# Patient Record
Sex: Male | Born: 1935 | Race: Black or African American | Hispanic: No | Marital: Married | Smoking: Former smoker
Health system: Southern US, Community
[De-identification: ages and names within clinical notes are randomized; demographics above are authoritative.]

## PROBLEM LIST (undated history)

## (undated) DIAGNOSIS — G8929 Other chronic pain: Secondary | ICD-10-CM

## (undated) DIAGNOSIS — H409 Unspecified glaucoma: Secondary | ICD-10-CM

## (undated) DIAGNOSIS — I2510 Atherosclerotic heart disease of native coronary artery without angina pectoris: Secondary | ICD-10-CM

## (undated) DIAGNOSIS — K279 Peptic ulcer, site unspecified, unspecified as acute or chronic, without hemorrhage or perforation: Secondary | ICD-10-CM

## (undated) DIAGNOSIS — N183 Chronic kidney disease, stage 3 unspecified: Secondary | ICD-10-CM

## (undated) DIAGNOSIS — R05 Cough: Secondary | ICD-10-CM

## (undated) DIAGNOSIS — R413 Other amnesia: Secondary | ICD-10-CM

## (undated) DIAGNOSIS — F0280 Dementia in other diseases classified elsewhere without behavioral disturbance: Secondary | ICD-10-CM

## (undated) DIAGNOSIS — D649 Anemia, unspecified: Secondary | ICD-10-CM

## (undated) DIAGNOSIS — I6529 Occlusion and stenosis of unspecified carotid artery: Secondary | ICD-10-CM

## (undated) DIAGNOSIS — G309 Alzheimer's disease, unspecified: Secondary | ICD-10-CM

## (undated) DIAGNOSIS — J449 Chronic obstructive pulmonary disease, unspecified: Secondary | ICD-10-CM

## (undated) DIAGNOSIS — I10 Essential (primary) hypertension: Secondary | ICD-10-CM

## (undated) DIAGNOSIS — F418 Other specified anxiety disorders: Secondary | ICD-10-CM

## (undated) DIAGNOSIS — E785 Hyperlipidemia, unspecified: Secondary | ICD-10-CM

## (undated) DIAGNOSIS — R059 Cough, unspecified: Secondary | ICD-10-CM

## (undated) DIAGNOSIS — R0789 Other chest pain: Secondary | ICD-10-CM

## (undated) DIAGNOSIS — Z9981 Dependence on supplemental oxygen: Secondary | ICD-10-CM

## (undated) DIAGNOSIS — D472 Monoclonal gammopathy: Secondary | ICD-10-CM

## (undated) DIAGNOSIS — N400 Enlarged prostate without lower urinary tract symptoms: Secondary | ICD-10-CM

## (undated) DIAGNOSIS — H349 Unspecified retinal vascular occlusion: Secondary | ICD-10-CM

## (undated) DIAGNOSIS — I739 Peripheral vascular disease, unspecified: Secondary | ICD-10-CM

## (undated) DIAGNOSIS — N419 Inflammatory disease of prostate, unspecified: Secondary | ICD-10-CM

## (undated) DIAGNOSIS — G709 Myoneural disorder, unspecified: Secondary | ICD-10-CM

## (undated) DIAGNOSIS — K219 Gastro-esophageal reflux disease without esophagitis: Secondary | ICD-10-CM

## (undated) HISTORY — DX: Atherosclerotic heart disease of native coronary artery without angina pectoris: I25.10

## (undated) HISTORY — DX: Alzheimer's disease, unspecified: G30.9

## (undated) HISTORY — DX: Other chest pain: R07.89

## (undated) HISTORY — DX: Chronic kidney disease, stage 3 (moderate): N18.3

## (undated) HISTORY — DX: Dependence on supplemental oxygen: Z99.81

## (undated) HISTORY — DX: Chronic obstructive pulmonary disease, unspecified: J44.9

## (undated) HISTORY — DX: Essential (primary) hypertension: I10

## (undated) HISTORY — DX: Gastro-esophageal reflux disease without esophagitis: K21.9

## (undated) HISTORY — DX: Peripheral vascular disease, unspecified: I73.9

## (undated) HISTORY — DX: Monoclonal gammopathy: D47.2

## (undated) HISTORY — DX: Other specified anxiety disorders: F41.8

## (undated) HISTORY — DX: Peptic ulcer, site unspecified, unspecified as acute or chronic, without hemorrhage or perforation: K27.9

## (undated) HISTORY — DX: Dementia in other diseases classified elsewhere, unspecified severity, without behavioral disturbance, psychotic disturbance, mood disturbance, and anxiety: F02.80

## (undated) HISTORY — DX: Chronic kidney disease, stage 3 unspecified: N18.30

## (undated) HISTORY — DX: Other chronic pain: G89.29

## (undated) HISTORY — DX: Occlusion and stenosis of unspecified carotid artery: I65.29

## (undated) HISTORY — DX: Unspecified retinal vascular occlusion: H34.9

## (undated) HISTORY — PX: RENAL ARTERY STENT: SHX2321

## (undated) HISTORY — DX: Hyperlipidemia, unspecified: E78.5

## (undated) HISTORY — DX: Other amnesia: R41.3

## (undated) HISTORY — PX: CARDIAC SURGERY: SHX584

---

## 1995-07-24 HISTORY — PX: CORONARY ARTERY BYPASS GRAFT: SHX141

## 1998-07-27 ENCOUNTER — Encounter: Payer: Self-pay | Admitting: Emergency Medicine

## 1998-07-27 ENCOUNTER — Emergency Department (HOSPITAL_COMMUNITY): Admission: EM | Admit: 1998-07-27 | Discharge: 1998-07-27 | Payer: Self-pay | Admitting: Emergency Medicine

## 1998-11-28 ENCOUNTER — Ambulatory Visit (HOSPITAL_COMMUNITY): Admission: RE | Admit: 1998-11-28 | Discharge: 1998-11-28 | Payer: Self-pay | Admitting: Gastroenterology

## 1999-02-02 ENCOUNTER — Ambulatory Visit (HOSPITAL_COMMUNITY): Admission: RE | Admit: 1999-02-02 | Discharge: 1999-02-02 | Payer: Self-pay | Admitting: Gastroenterology

## 1999-08-11 ENCOUNTER — Encounter: Admission: RE | Admit: 1999-08-11 | Discharge: 1999-08-11 | Payer: Self-pay | Admitting: Gastroenterology

## 1999-08-11 ENCOUNTER — Encounter: Payer: Self-pay | Admitting: Gastroenterology

## 1999-09-05 ENCOUNTER — Ambulatory Visit (HOSPITAL_COMMUNITY): Admission: RE | Admit: 1999-09-05 | Discharge: 1999-09-05 | Payer: Self-pay | Admitting: Gastroenterology

## 1999-09-20 ENCOUNTER — Encounter: Admission: RE | Admit: 1999-09-20 | Discharge: 1999-09-20 | Payer: Self-pay | Admitting: Gastroenterology

## 1999-09-20 ENCOUNTER — Encounter: Payer: Self-pay | Admitting: Gastroenterology

## 2000-03-22 ENCOUNTER — Encounter: Admission: RE | Admit: 2000-03-22 | Discharge: 2000-03-22 | Payer: Self-pay | Admitting: Gastroenterology

## 2000-03-22 ENCOUNTER — Encounter: Payer: Self-pay | Admitting: Gastroenterology

## 2000-06-03 ENCOUNTER — Ambulatory Visit: Admission: RE | Admit: 2000-06-03 | Discharge: 2000-06-03 | Payer: Self-pay | Admitting: Family Medicine

## 2000-06-03 ENCOUNTER — Encounter: Payer: Self-pay | Admitting: Family Medicine

## 2000-07-23 HISTORY — PX: OTHER SURGICAL HISTORY: SHX169

## 2001-08-06 ENCOUNTER — Ambulatory Visit (HOSPITAL_COMMUNITY): Admission: RE | Admit: 2001-08-06 | Discharge: 2001-08-06 | Payer: Self-pay | Admitting: Gastroenterology

## 2001-09-25 ENCOUNTER — Ambulatory Visit (HOSPITAL_COMMUNITY): Admission: RE | Admit: 2001-09-25 | Discharge: 2001-09-25 | Payer: Self-pay | Admitting: Gastroenterology

## 2001-09-25 ENCOUNTER — Encounter: Payer: Self-pay | Admitting: Gastroenterology

## 2002-05-24 ENCOUNTER — Emergency Department (HOSPITAL_COMMUNITY): Admission: EM | Admit: 2002-05-24 | Discharge: 2002-05-24 | Payer: Self-pay | Admitting: Internal Medicine

## 2002-05-24 ENCOUNTER — Encounter: Payer: Self-pay | Admitting: Internal Medicine

## 2002-05-26 ENCOUNTER — Ambulatory Visit (HOSPITAL_COMMUNITY): Admission: RE | Admit: 2002-05-26 | Discharge: 2002-05-26 | Payer: Self-pay | Admitting: Cardiology

## 2002-07-01 ENCOUNTER — Encounter: Payer: Self-pay | Admitting: Vascular Surgery

## 2002-07-03 ENCOUNTER — Encounter (INDEPENDENT_AMBULATORY_CARE_PROVIDER_SITE_OTHER): Payer: Self-pay | Admitting: *Deleted

## 2002-07-03 ENCOUNTER — Encounter: Payer: Self-pay | Admitting: Vascular Surgery

## 2002-07-03 ENCOUNTER — Inpatient Hospital Stay (HOSPITAL_COMMUNITY): Admission: RE | Admit: 2002-07-03 | Discharge: 2002-07-04 | Payer: Self-pay | Admitting: Vascular Surgery

## 2002-08-06 ENCOUNTER — Ambulatory Visit (HOSPITAL_COMMUNITY): Admission: RE | Admit: 2002-08-06 | Discharge: 2002-08-06 | Payer: Self-pay | Admitting: *Deleted

## 2002-08-12 ENCOUNTER — Ambulatory Visit (HOSPITAL_COMMUNITY): Admission: RE | Admit: 2002-08-12 | Discharge: 2002-08-12 | Payer: Self-pay | Admitting: Cardiology

## 2003-06-08 ENCOUNTER — Encounter: Admission: RE | Admit: 2003-06-08 | Discharge: 2003-06-08 | Payer: Self-pay | Admitting: Surgery

## 2003-08-13 ENCOUNTER — Encounter
Admission: RE | Admit: 2003-08-13 | Discharge: 2003-11-11 | Payer: Self-pay | Admitting: Physical Medicine & Rehabilitation

## 2003-09-20 ENCOUNTER — Ambulatory Visit (HOSPITAL_COMMUNITY): Admission: RE | Admit: 2003-09-20 | Discharge: 2003-09-20 | Payer: Self-pay | Admitting: Family Medicine

## 2003-11-01 ENCOUNTER — Ambulatory Visit (HOSPITAL_COMMUNITY): Admission: RE | Admit: 2003-11-01 | Discharge: 2003-11-01 | Payer: Self-pay | Admitting: Family Medicine

## 2003-11-24 ENCOUNTER — Ambulatory Visit (HOSPITAL_COMMUNITY): Admission: RE | Admit: 2003-11-24 | Discharge: 2003-11-24 | Payer: Self-pay | Admitting: Urology

## 2003-11-24 ENCOUNTER — Ambulatory Visit (HOSPITAL_BASED_OUTPATIENT_CLINIC_OR_DEPARTMENT_OTHER): Admission: RE | Admit: 2003-11-24 | Discharge: 2003-11-24 | Payer: Self-pay | Admitting: Urology

## 2003-11-26 ENCOUNTER — Emergency Department (HOSPITAL_COMMUNITY): Admission: EM | Admit: 2003-11-26 | Discharge: 2003-11-27 | Payer: Self-pay | Admitting: *Deleted

## 2003-12-15 ENCOUNTER — Encounter
Admission: RE | Admit: 2003-12-15 | Discharge: 2004-03-14 | Payer: Self-pay | Admitting: Physical Medicine & Rehabilitation

## 2004-03-14 ENCOUNTER — Encounter
Admission: RE | Admit: 2004-03-14 | Discharge: 2004-06-12 | Payer: Self-pay | Admitting: Physical Medicine & Rehabilitation

## 2004-05-05 ENCOUNTER — Encounter: Admission: RE | Admit: 2004-05-05 | Discharge: 2004-05-05 | Payer: Self-pay | Admitting: Gastroenterology

## 2004-06-19 ENCOUNTER — Ambulatory Visit: Payer: Self-pay | Admitting: Oncology

## 2004-08-07 ENCOUNTER — Ambulatory Visit (HOSPITAL_COMMUNITY): Admission: RE | Admit: 2004-08-07 | Discharge: 2004-08-07 | Payer: Self-pay | Admitting: Family Medicine

## 2004-08-14 ENCOUNTER — Encounter
Admission: RE | Admit: 2004-08-14 | Discharge: 2004-11-12 | Payer: Self-pay | Admitting: Physical Medicine & Rehabilitation

## 2004-08-16 ENCOUNTER — Ambulatory Visit: Payer: Self-pay | Admitting: Physical Medicine & Rehabilitation

## 2004-08-29 ENCOUNTER — Ambulatory Visit: Admission: RE | Admit: 2004-08-29 | Discharge: 2004-08-29 | Payer: Self-pay | Admitting: Family Medicine

## 2004-10-02 ENCOUNTER — Ambulatory Visit: Payer: Self-pay | Admitting: Oncology

## 2004-10-29 ENCOUNTER — Emergency Department (HOSPITAL_COMMUNITY): Admission: EM | Admit: 2004-10-29 | Discharge: 2004-10-29 | Payer: Self-pay | Admitting: Emergency Medicine

## 2005-01-29 ENCOUNTER — Ambulatory Visit: Payer: Self-pay | Admitting: Oncology

## 2005-02-06 ENCOUNTER — Encounter
Admission: RE | Admit: 2005-02-06 | Discharge: 2005-05-07 | Payer: Self-pay | Admitting: Physical Medicine & Rehabilitation

## 2005-02-22 ENCOUNTER — Ambulatory Visit: Payer: Self-pay | Admitting: Physical Medicine & Rehabilitation

## 2005-04-20 ENCOUNTER — Ambulatory Visit (HOSPITAL_COMMUNITY): Admission: RE | Admit: 2005-04-20 | Discharge: 2005-04-20 | Payer: Self-pay | Admitting: Family Medicine

## 2005-04-26 ENCOUNTER — Encounter: Admission: RE | Admit: 2005-04-26 | Discharge: 2005-04-26 | Payer: Self-pay | Admitting: Cardiology

## 2005-06-08 ENCOUNTER — Ambulatory Visit (HOSPITAL_COMMUNITY): Admission: RE | Admit: 2005-06-08 | Discharge: 2005-06-08 | Payer: Self-pay | Admitting: Cardiology

## 2005-07-27 ENCOUNTER — Ambulatory Visit: Payer: Self-pay | Admitting: Oncology

## 2005-08-21 ENCOUNTER — Encounter
Admission: RE | Admit: 2005-08-21 | Discharge: 2005-08-22 | Payer: Self-pay | Admitting: Physical Medicine & Rehabilitation

## 2005-08-21 ENCOUNTER — Ambulatory Visit: Payer: Self-pay | Admitting: Physical Medicine & Rehabilitation

## 2005-09-10 ENCOUNTER — Ambulatory Visit (HOSPITAL_COMMUNITY): Admission: RE | Admit: 2005-09-10 | Discharge: 2005-09-10 | Payer: Self-pay | Admitting: Cardiology

## 2005-10-08 ENCOUNTER — Emergency Department (HOSPITAL_COMMUNITY): Admission: EM | Admit: 2005-10-08 | Discharge: 2005-10-08 | Payer: Self-pay | Admitting: Emergency Medicine

## 2005-10-10 ENCOUNTER — Ambulatory Visit: Payer: Self-pay | Admitting: Critical Care Medicine

## 2005-10-18 ENCOUNTER — Observation Stay (HOSPITAL_COMMUNITY): Admission: RE | Admit: 2005-10-18 | Discharge: 2005-10-19 | Payer: Self-pay | Admitting: Interventional Cardiology

## 2005-10-30 ENCOUNTER — Observation Stay (HOSPITAL_COMMUNITY): Admission: RE | Admit: 2005-10-30 | Discharge: 2005-10-31 | Payer: Self-pay | Admitting: Interventional Cardiology

## 2005-12-19 ENCOUNTER — Encounter
Admission: RE | Admit: 2005-12-19 | Discharge: 2006-03-19 | Payer: Self-pay | Admitting: Physical Medicine & Rehabilitation

## 2005-12-19 ENCOUNTER — Ambulatory Visit: Payer: Self-pay | Admitting: Physical Medicine & Rehabilitation

## 2006-01-21 ENCOUNTER — Ambulatory Visit: Payer: Self-pay | Admitting: Oncology

## 2006-01-28 LAB — CBC WITH DIFFERENTIAL/PLATELET
BASO%: 0.3 % (ref 0.0–2.0)
EOS%: 3.1 % (ref 0.0–7.0)
HCT: 35 % — ABNORMAL LOW (ref 38.7–49.9)
LYMPH%: 35.7 % (ref 14.0–48.0)
MCH: 32.6 pg (ref 28.0–33.4)
MCHC: 34.6 g/dL (ref 32.0–35.9)
MCV: 94.4 fL (ref 81.6–98.0)
MONO%: 9.4 % (ref 0.0–13.0)
NEUT%: 51.5 % (ref 40.0–75.0)
lymph#: 1.5 10*3/uL (ref 0.9–3.3)

## 2006-01-29 ENCOUNTER — Ambulatory Visit: Payer: Self-pay | Admitting: Critical Care Medicine

## 2006-01-31 LAB — SPEP & IFE WITH QIG
Alpha-1-Globulin: 4.3 % (ref 2.9–4.9)
Alpha-2-Globulin: 9.4 % (ref 7.1–11.8)
Beta 2: 4.8 % (ref 3.2–6.5)
Beta Globulin: 5.9 % (ref 4.7–7.2)
Gamma Globulin: 19.6 % — ABNORMAL HIGH (ref 11.1–18.8)

## 2006-01-31 LAB — COMPREHENSIVE METABOLIC PANEL
ALT: 25 U/L (ref 0–40)
AST: 31 U/L (ref 0–37)
Alkaline Phosphatase: 63 U/L (ref 39–117)
Chloride: 107 mEq/L (ref 96–112)
Creatinine, Ser: 1.77 mg/dL — ABNORMAL HIGH (ref 0.40–1.50)
Total Bilirubin: 0.5 mg/dL (ref 0.3–1.2)

## 2006-02-07 ENCOUNTER — Ambulatory Visit: Payer: Self-pay | Admitting: Internal Medicine

## 2006-02-14 LAB — BASIC METABOLIC PANEL
BUN: 17 mg/dL (ref 6–23)
CO2: 25 mEq/L (ref 19–32)
Calcium: 9.5 mg/dL (ref 8.4–10.5)
Creatinine, Ser: 1.97 mg/dL — ABNORMAL HIGH (ref 0.40–1.50)
Glucose, Bld: 132 mg/dL — ABNORMAL HIGH (ref 70–99)

## 2006-02-26 ENCOUNTER — Ambulatory Visit: Payer: Self-pay | Admitting: Critical Care Medicine

## 2006-04-02 ENCOUNTER — Encounter
Admission: RE | Admit: 2006-04-02 | Discharge: 2006-07-01 | Payer: Self-pay | Admitting: Physical Medicine & Rehabilitation

## 2006-04-02 ENCOUNTER — Ambulatory Visit: Payer: Self-pay | Admitting: Physical Medicine & Rehabilitation

## 2006-04-10 ENCOUNTER — Ambulatory Visit: Payer: Self-pay | Admitting: Critical Care Medicine

## 2006-04-17 ENCOUNTER — Ambulatory Visit: Payer: Self-pay | Admitting: Oncology

## 2006-04-19 LAB — BASIC METABOLIC PANEL
CO2: 26 mEq/L (ref 19–32)
Calcium: 9.5 mg/dL (ref 8.4–10.5)
Glucose, Bld: 107 mg/dL — ABNORMAL HIGH (ref 70–99)
Sodium: 140 mEq/L (ref 135–145)

## 2006-05-01 ENCOUNTER — Ambulatory Visit: Payer: Self-pay | Admitting: Critical Care Medicine

## 2006-08-05 ENCOUNTER — Ambulatory Visit: Payer: Self-pay | Admitting: Oncology

## 2006-08-08 LAB — CBC WITH DIFFERENTIAL/PLATELET
BASO%: 1.1 % (ref 0.0–2.0)
EOS%: 1 % (ref 0.0–7.0)
HCT: 36.7 % — ABNORMAL LOW (ref 38.7–49.9)
MCH: 33.1 pg (ref 28.0–33.4)
MCHC: 34 g/dL (ref 32.0–35.9)
NEUT%: 60.8 % (ref 40.0–75.0)
RDW: 14.4 % (ref 11.2–14.6)
lymph#: 1.4 10*3/uL (ref 0.9–3.3)

## 2006-08-09 LAB — PROTEIN ELECTROPHORESIS, SERUM
Alpha-2-Globulin: 9.2 % (ref 7.1–11.8)
Beta 2: 4.7 % (ref 3.2–6.5)
Beta Globulin: 5.9 % (ref 4.7–7.2)
Gamma Globulin: 17.7 % (ref 11.1–18.8)
M-Spike, %: 0.6 g/dL

## 2006-08-09 LAB — COMPREHENSIVE METABOLIC PANEL
ALT: 12 U/L (ref 0–53)
AST: 18 U/L (ref 0–37)
Calcium: 9.5 mg/dL (ref 8.4–10.5)
Chloride: 107 mEq/L (ref 96–112)
Creatinine, Ser: 1.46 mg/dL (ref 0.40–1.50)
Total Bilirubin: 0.5 mg/dL (ref 0.3–1.2)

## 2006-08-15 ENCOUNTER — Ambulatory Visit: Payer: Self-pay | Admitting: Critical Care Medicine

## 2006-08-23 ENCOUNTER — Inpatient Hospital Stay (HOSPITAL_COMMUNITY): Admission: AD | Admit: 2006-08-23 | Discharge: 2006-08-27 | Payer: Self-pay | Admitting: Cardiology

## 2006-09-16 ENCOUNTER — Ambulatory Visit: Payer: Self-pay | Admitting: Critical Care Medicine

## 2006-09-25 ENCOUNTER — Encounter
Admission: RE | Admit: 2006-09-25 | Discharge: 2006-12-24 | Payer: Self-pay | Admitting: Physical Medicine & Rehabilitation

## 2006-09-27 ENCOUNTER — Encounter: Admission: RE | Admit: 2006-09-27 | Discharge: 2006-09-27 | Payer: Self-pay | Admitting: Gastroenterology

## 2006-11-29 ENCOUNTER — Ambulatory Visit: Payer: Self-pay | Admitting: Critical Care Medicine

## 2007-02-07 ENCOUNTER — Ambulatory Visit: Payer: Self-pay | Admitting: Oncology

## 2007-02-11 LAB — CBC WITH DIFFERENTIAL/PLATELET
Basophils Absolute: 0 10*3/uL (ref 0.0–0.1)
Eosinophils Absolute: 0.1 10*3/uL (ref 0.0–0.5)
HCT: 34.5 % — ABNORMAL LOW (ref 38.7–49.9)
HGB: 12.2 g/dL — ABNORMAL LOW (ref 13.0–17.1)
NEUT#: 2.2 10*3/uL (ref 1.5–6.5)
RDW: 13.3 % (ref 11.2–14.6)
lymph#: 1.5 10*3/uL (ref 0.9–3.3)

## 2007-02-13 LAB — COMPREHENSIVE METABOLIC PANEL
AST: 25 U/L (ref 0–37)
Albumin: 4.2 g/dL (ref 3.5–5.2)
BUN: 18 mg/dL (ref 6–23)
CO2: 24 mEq/L (ref 19–32)
Calcium: 8.8 mg/dL (ref 8.4–10.5)
Chloride: 107 mEq/L (ref 96–112)
Glucose, Bld: 100 mg/dL — ABNORMAL HIGH (ref 70–99)
Potassium: 5 mEq/L (ref 3.5–5.3)

## 2007-02-13 LAB — PROTEIN ELECTROPHORESIS, SERUM
Albumin ELP: 55.9 % (ref 55.8–66.1)
Alpha-1-Globulin: 4.9 % (ref 2.9–4.9)
Gamma Globulin: 17.7 % (ref 11.1–18.8)
Total Protein, Serum Electrophoresis: 7 g/dL (ref 6.0–8.3)

## 2007-03-26 ENCOUNTER — Ambulatory Visit: Payer: Self-pay | Admitting: Critical Care Medicine

## 2007-03-26 DIAGNOSIS — I10 Essential (primary) hypertension: Secondary | ICD-10-CM | POA: Insufficient documentation

## 2007-03-26 DIAGNOSIS — E785 Hyperlipidemia, unspecified: Secondary | ICD-10-CM

## 2007-03-26 DIAGNOSIS — D472 Monoclonal gammopathy: Secondary | ICD-10-CM | POA: Insufficient documentation

## 2007-03-26 DIAGNOSIS — N183 Chronic kidney disease, stage 3 (moderate): Secondary | ICD-10-CM

## 2007-08-05 ENCOUNTER — Ambulatory Visit: Payer: Self-pay | Admitting: Critical Care Medicine

## 2007-08-19 ENCOUNTER — Ambulatory Visit: Payer: Self-pay | Admitting: Oncology

## 2007-08-20 LAB — CBC WITH DIFFERENTIAL/PLATELET
BASO%: 0.5 % (ref 0.0–2.0)
HCT: 40.4 % (ref 38.7–49.9)
MCHC: 33.4 g/dL (ref 32.0–35.9)
MONO#: 0.3 10*3/uL (ref 0.1–0.9)
NEUT#: 3.5 10*3/uL (ref 1.5–6.5)
NEUT%: 63.7 % (ref 40.0–75.0)
WBC: 5.6 10*3/uL (ref 4.0–10.0)
lymph#: 1.5 10*3/uL (ref 0.9–3.3)

## 2007-08-25 LAB — SPEP & IFE WITH QIG
Alpha-2-Globulin: 9.7 % (ref 7.1–11.8)
Beta 2: 4.9 % (ref 3.2–6.5)
Beta Globulin: 5.5 % (ref 4.7–7.2)
Gamma Globulin: 18.9 % — ABNORMAL HIGH (ref 11.1–18.8)
IgG (Immunoglobin G), Serum: 1620 mg/dL — ABNORMAL HIGH (ref 694–1618)
M-Spike, %: 0.84 g/dL

## 2007-08-25 LAB — COMPREHENSIVE METABOLIC PANEL
ALT: 18 U/L (ref 0–53)
CO2: 26 mEq/L (ref 19–32)
Calcium: 9.3 mg/dL (ref 8.4–10.5)
Chloride: 105 mEq/L (ref 96–112)
Creatinine, Ser: 1.51 mg/dL — ABNORMAL HIGH (ref 0.40–1.50)
Sodium: 140 mEq/L (ref 135–145)
Total Protein: 7.7 g/dL (ref 6.0–8.3)

## 2007-09-11 ENCOUNTER — Encounter: Payer: Self-pay | Admitting: Critical Care Medicine

## 2007-10-10 ENCOUNTER — Ambulatory Visit: Payer: Self-pay | Admitting: Critical Care Medicine

## 2007-10-10 ENCOUNTER — Ambulatory Visit: Payer: Self-pay | Admitting: Pulmonary Disease

## 2007-10-10 DIAGNOSIS — R071 Chest pain on breathing: Secondary | ICD-10-CM | POA: Insufficient documentation

## 2007-10-24 ENCOUNTER — Ambulatory Visit: Payer: Self-pay | Admitting: Critical Care Medicine

## 2007-12-19 ENCOUNTER — Encounter: Admission: RE | Admit: 2007-12-19 | Discharge: 2008-01-20 | Payer: Self-pay | Admitting: Neurology

## 2008-01-15 ENCOUNTER — Ambulatory Visit: Payer: Self-pay | Admitting: Critical Care Medicine

## 2008-03-03 ENCOUNTER — Ambulatory Visit: Payer: Self-pay | Admitting: Critical Care Medicine

## 2008-03-24 ENCOUNTER — Encounter: Payer: Self-pay | Admitting: Critical Care Medicine

## 2008-03-30 ENCOUNTER — Encounter: Payer: Self-pay | Admitting: Critical Care Medicine

## 2008-04-09 ENCOUNTER — Ambulatory Visit: Payer: Self-pay | Admitting: Critical Care Medicine

## 2008-05-03 ENCOUNTER — Telehealth (INDEPENDENT_AMBULATORY_CARE_PROVIDER_SITE_OTHER): Payer: Self-pay | Admitting: *Deleted

## 2008-05-03 ENCOUNTER — Ambulatory Visit: Payer: Self-pay | Admitting: Internal Medicine

## 2008-05-03 DIAGNOSIS — J441 Chronic obstructive pulmonary disease with (acute) exacerbation: Secondary | ICD-10-CM

## 2008-05-11 ENCOUNTER — Ambulatory Visit (HOSPITAL_BASED_OUTPATIENT_CLINIC_OR_DEPARTMENT_OTHER): Admission: RE | Admit: 2008-05-11 | Discharge: 2008-05-11 | Payer: Self-pay | Admitting: Urology

## 2008-06-01 ENCOUNTER — Ambulatory Visit: Payer: Self-pay | Admitting: Critical Care Medicine

## 2008-06-14 ENCOUNTER — Emergency Department (HOSPITAL_COMMUNITY): Admission: EM | Admit: 2008-06-14 | Discharge: 2008-06-14 | Payer: Self-pay | Admitting: Emergency Medicine

## 2008-07-05 ENCOUNTER — Ambulatory Visit: Payer: Self-pay | Admitting: Cardiovascular Disease

## 2008-08-16 ENCOUNTER — Ambulatory Visit: Payer: Self-pay | Admitting: Critical Care Medicine

## 2008-08-16 DIAGNOSIS — J309 Allergic rhinitis, unspecified: Secondary | ICD-10-CM

## 2008-09-01 ENCOUNTER — Ambulatory Visit: Payer: Self-pay | Admitting: Oncology

## 2008-09-03 LAB — CBC WITH DIFFERENTIAL/PLATELET
Eosinophils Absolute: 0.1 10*3/uL (ref 0.0–0.5)
HCT: 34.2 % — ABNORMAL LOW (ref 38.7–49.9)
LYMPH%: 40.6 % (ref 14.0–48.0)
MONO#: 0.5 10*3/uL (ref 0.1–0.9)
NEUT#: 2 10*3/uL (ref 1.5–6.5)
NEUT%: 44.3 % (ref 40.0–75.0)
Platelets: 174 10*3/uL (ref 145–400)
WBC: 4.6 10*3/uL (ref 4.0–10.0)

## 2008-09-07 LAB — COMPREHENSIVE METABOLIC PANEL
AST: 36 U/L (ref 0–37)
Albumin: 4.2 g/dL (ref 3.5–5.2)
BUN: 24 mg/dL — ABNORMAL HIGH (ref 6–23)
CO2: 23 mEq/L (ref 19–32)
Chloride: 112 mEq/L (ref 96–112)
Creatinine, Ser: 1.85 mg/dL — ABNORMAL HIGH (ref 0.40–1.50)
Glucose, Bld: 91 mg/dL (ref 70–99)
Total Bilirubin: 0.3 mg/dL (ref 0.3–1.2)
Total Protein: 7.3 g/dL (ref 6.0–8.3)

## 2008-09-07 LAB — PROTEIN ELECTROPHORESIS, SERUM
Alpha-1-Globulin: 4.6 % (ref 2.9–4.9)
Alpha-2-Globulin: 10.5 % (ref 7.1–11.8)
Beta 2: 4.1 % (ref 3.2–6.5)
Gamma Globulin: 18.4 % (ref 11.1–18.8)

## 2008-09-07 LAB — IGG, IGA, IGM: IgM, Serum: 70 mg/dL (ref 60–263)

## 2008-09-10 ENCOUNTER — Encounter: Payer: Self-pay | Admitting: Critical Care Medicine

## 2008-09-16 ENCOUNTER — Inpatient Hospital Stay (HOSPITAL_COMMUNITY): Admission: AD | Admit: 2008-09-16 | Discharge: 2008-09-17 | Payer: Self-pay | Admitting: Interventional Cardiology

## 2008-11-15 ENCOUNTER — Encounter
Admission: RE | Admit: 2008-11-15 | Discharge: 2009-02-13 | Payer: Self-pay | Admitting: Physical Medicine & Rehabilitation

## 2008-11-18 ENCOUNTER — Ambulatory Visit: Payer: Self-pay | Admitting: Physical Medicine & Rehabilitation

## 2008-11-29 ENCOUNTER — Encounter
Admission: RE | Admit: 2008-11-29 | Discharge: 2009-01-04 | Payer: Self-pay | Admitting: Physical Medicine & Rehabilitation

## 2008-12-21 ENCOUNTER — Ambulatory Visit: Payer: Self-pay | Admitting: Physical Medicine & Rehabilitation

## 2009-03-01 ENCOUNTER — Encounter
Admission: RE | Admit: 2009-03-01 | Discharge: 2009-03-08 | Payer: Self-pay | Admitting: Physical Medicine & Rehabilitation

## 2009-03-03 ENCOUNTER — Ambulatory Visit: Payer: Self-pay | Admitting: Oncology

## 2009-03-07 LAB — CBC WITH DIFFERENTIAL/PLATELET
BASO%: 0.4 % (ref 0.0–2.0)
EOS%: 2.4 % (ref 0.0–7.0)
HCT: 34.5 % — ABNORMAL LOW (ref 38.4–49.9)
MCH: 32.6 pg (ref 27.2–33.4)
MCHC: 34.6 g/dL (ref 32.0–36.0)
NEUT%: 48.4 % (ref 39.0–75.0)
RBC: 3.65 10*6/uL — ABNORMAL LOW (ref 4.20–5.82)
RDW: 13.8 % (ref 11.0–14.6)
lymph#: 1.6 10*3/uL (ref 0.9–3.3)

## 2009-03-08 ENCOUNTER — Ambulatory Visit: Payer: Self-pay | Admitting: Physical Medicine & Rehabilitation

## 2009-03-09 LAB — COMPREHENSIVE METABOLIC PANEL
ALT: 19 U/L (ref 0–53)
AST: 29 U/L (ref 0–37)
Albumin: 4.2 g/dL (ref 3.5–5.2)
Alkaline Phosphatase: 49 U/L (ref 39–117)
BUN: 23 mg/dL (ref 6–23)
CO2: 23 mEq/L (ref 19–32)
Calcium: 9.6 mg/dL (ref 8.4–10.5)
Chloride: 107 mEq/L (ref 96–112)
Creatinine, Ser: 1.61 mg/dL — ABNORMAL HIGH (ref 0.40–1.50)
Glucose, Bld: 90 mg/dL (ref 70–99)
Potassium: 4.9 mEq/L (ref 3.5–5.3)
Sodium: 139 mEq/L (ref 135–145)
Total Bilirubin: 0.3 mg/dL (ref 0.3–1.2)
Total Protein: 7.4 g/dL (ref 6.0–8.3)

## 2009-03-09 LAB — SPEP & IFE WITH QIG
Alpha-1-Globulin: 4.8 % (ref 2.9–4.9)
Alpha-2-Globulin: 10.7 % (ref 7.1–11.8)
Gamma Globulin: 19.6 % — ABNORMAL HIGH (ref 11.1–18.8)
Total Protein, Serum Electrophoresis: 7.4 g/dL (ref 6.0–8.3)

## 2009-03-24 ENCOUNTER — Encounter: Payer: Self-pay | Admitting: Critical Care Medicine

## 2009-04-19 ENCOUNTER — Ambulatory Visit: Payer: Self-pay | Admitting: Critical Care Medicine

## 2009-04-19 DIAGNOSIS — M2550 Pain in unspecified joint: Secondary | ICD-10-CM

## 2009-04-28 ENCOUNTER — Encounter: Payer: Self-pay | Admitting: Critical Care Medicine

## 2009-05-16 ENCOUNTER — Encounter: Payer: Self-pay | Admitting: Critical Care Medicine

## 2009-07-23 HISTORY — PX: BACK SURGERY: SHX140

## 2009-11-08 ENCOUNTER — Encounter
Admission: RE | Admit: 2009-11-08 | Discharge: 2010-01-10 | Payer: Self-pay | Admitting: Physical Medicine & Rehabilitation

## 2009-12-15 ENCOUNTER — Ambulatory Visit: Payer: Self-pay | Admitting: Physical Medicine & Rehabilitation

## 2009-12-22 ENCOUNTER — Ambulatory Visit: Payer: Self-pay | Admitting: Vascular Surgery

## 2009-12-27 ENCOUNTER — Encounter: Admission: RE | Admit: 2009-12-27 | Discharge: 2009-12-27 | Payer: Self-pay | Admitting: Family Medicine

## 2010-01-10 ENCOUNTER — Encounter
Admission: RE | Admit: 2010-01-10 | Discharge: 2010-01-16 | Payer: Self-pay | Admitting: Physical Medicine & Rehabilitation

## 2010-01-16 ENCOUNTER — Ambulatory Visit: Payer: Self-pay | Admitting: Physical Medicine & Rehabilitation

## 2010-02-02 ENCOUNTER — Ambulatory Visit (HOSPITAL_COMMUNITY)
Admission: RE | Admit: 2010-02-02 | Discharge: 2010-02-03 | Payer: Self-pay | Source: Home / Self Care | Admitting: Neurological Surgery

## 2010-03-21 ENCOUNTER — Ambulatory Visit: Payer: Self-pay | Admitting: Oncology

## 2010-04-10 LAB — CBC WITH DIFFERENTIAL/PLATELET
BASO%: 0.3 % (ref 0.0–2.0)
Basophils Absolute: 0 10*3/uL (ref 0.0–0.1)
EOS%: 3 % (ref 0.0–7.0)
Eosinophils Absolute: 0.1 10*3/uL (ref 0.0–0.5)
HCT: 34.2 % — ABNORMAL LOW (ref 38.4–49.9)
HGB: 11.6 g/dL — ABNORMAL LOW (ref 13.0–17.1)
LYMPH%: 39 % (ref 14.0–49.0)
MCH: 32.4 pg (ref 27.2–33.4)
MCHC: 33.9 g/dL (ref 32.0–36.0)
MCV: 95.6 fL (ref 79.3–98.0)
MONO#: 0.4 10*3/uL (ref 0.1–0.9)
MONO%: 9 % (ref 0.0–14.0)
NEUT#: 2.3 10*3/uL (ref 1.5–6.5)
NEUT%: 48.7 % (ref 39.0–75.0)
Platelets: 160 10*3/uL (ref 140–400)
RBC: 3.58 10*6/uL — ABNORMAL LOW (ref 4.20–5.82)
RDW: 14.6 % (ref 11.0–14.6)
WBC: 4.7 10*3/uL (ref 4.0–10.3)
lymph#: 1.8 10*3/uL (ref 0.9–3.3)

## 2010-04-11 ENCOUNTER — Encounter
Admission: RE | Admit: 2010-04-11 | Discharge: 2010-04-14 | Payer: Self-pay | Source: Home / Self Care | Attending: Physical Medicine & Rehabilitation | Admitting: Physical Medicine & Rehabilitation

## 2010-04-12 LAB — COMPREHENSIVE METABOLIC PANEL
ALT: 14 U/L (ref 0–53)
AST: 22 U/L (ref 0–37)
Albumin: 4.2 g/dL (ref 3.5–5.2)
Alkaline Phosphatase: 49 U/L (ref 39–117)
BUN: 18 mg/dL (ref 6–23)
CO2: 23 mEq/L (ref 19–32)
Calcium: 9.6 mg/dL (ref 8.4–10.5)
Chloride: 109 mEq/L (ref 96–112)
Creatinine, Ser: 1.68 mg/dL — ABNORMAL HIGH (ref 0.40–1.50)
Glucose, Bld: 93 mg/dL (ref 70–99)
Potassium: 4.9 mEq/L (ref 3.5–5.3)
Sodium: 142 mEq/L (ref 135–145)
Total Bilirubin: 0.2 mg/dL — ABNORMAL LOW (ref 0.3–1.2)
Total Protein: 7.1 g/dL (ref 6.0–8.3)

## 2010-04-12 LAB — PROTEIN ELECTROPHORESIS, SERUM
Albumin ELP: 55.2 % — ABNORMAL LOW (ref 55.8–66.1)
Alpha-1-Globulin: 6.6 % — ABNORMAL HIGH (ref 2.9–4.9)
Alpha-2-Globulin: 10.5 % (ref 7.1–11.8)
Beta 2: 3.6 % (ref 3.2–6.5)
Beta Globulin: 5.5 % (ref 4.7–7.2)
Gamma Globulin: 18.6 % (ref 11.1–18.8)
M-Spike, %: 0.56 g/dL
Total Protein, Serum Electrophoresis: 7.1 g/dL (ref 6.0–8.3)

## 2010-04-14 ENCOUNTER — Ambulatory Visit: Payer: Self-pay | Admitting: Physical Medicine & Rehabilitation

## 2010-04-20 ENCOUNTER — Ambulatory Visit: Payer: Self-pay | Admitting: Oncology

## 2010-04-24 LAB — URINALYSIS, MICROSCOPIC - CHCC
Bilirubin (Urine): NEGATIVE
Blood: NEGATIVE
Glucose: NEGATIVE g/dL
Ketones: NEGATIVE mg/dL
Leukocyte Esterase: NEGATIVE
Nitrite: NEGATIVE
Protein: NEGATIVE mg/dL
RBC count: NEGATIVE (ref 0–2)
Specific Gravity, Urine: 1.02 (ref 1.003–1.035)
pH: 6 (ref 4.6–8.0)

## 2010-04-29 ENCOUNTER — Emergency Department (HOSPITAL_COMMUNITY): Admission: EM | Admit: 2010-04-29 | Discharge: 2010-04-29 | Payer: Self-pay | Admitting: Emergency Medicine

## 2010-05-09 ENCOUNTER — Emergency Department (HOSPITAL_COMMUNITY): Admission: EM | Admit: 2010-05-09 | Discharge: 2010-05-10 | Payer: Self-pay | Admitting: Emergency Medicine

## 2010-05-19 ENCOUNTER — Encounter: Payer: Self-pay | Admitting: Internal Medicine

## 2010-08-09 ENCOUNTER — Encounter
Admission: RE | Admit: 2010-08-09 | Discharge: 2010-08-22 | Payer: Self-pay | Source: Home / Self Care | Attending: Physical Medicine & Rehabilitation | Admitting: Physical Medicine & Rehabilitation

## 2010-08-12 ENCOUNTER — Encounter: Payer: Self-pay | Admitting: Family Medicine

## 2010-08-12 ENCOUNTER — Encounter: Payer: Self-pay | Admitting: Surgery

## 2010-08-13 ENCOUNTER — Encounter: Payer: Self-pay | Admitting: Gastroenterology

## 2010-08-15 ENCOUNTER — Ambulatory Visit: Admit: 2010-08-15 | Payer: Self-pay | Admitting: Physical Medicine & Rehabilitation

## 2010-08-16 ENCOUNTER — Ambulatory Visit
Admission: RE | Admit: 2010-08-16 | Discharge: 2010-08-16 | Payer: Self-pay | Source: Home / Self Care | Attending: Critical Care Medicine | Admitting: Critical Care Medicine

## 2010-08-23 ENCOUNTER — Encounter: Payer: Self-pay | Admitting: Gastroenterology

## 2010-08-24 NOTE — Assessment & Plan Note (Signed)
Summary: Pulmonary OV   Primary Provider/Referring Provider:  Azucena Cecil  CC:  Acute Visit.  Last seen 03/2009.  c/o chest congestion with yellow, runny nose, chest tightness, and increased SOB with any actiivty x 2 months.  .  History of Present Illness: Pulmonary OV:    [name] [name] is a 77 year old African American male with history of chronic obstructive lung disease - gold stage 3, asthmatic bronchitis, hypertension, reflux disease.  Quit smoking in 1994. He has had previous bypass surgery in 1997  Previous chronic chest wall pain after CABG followed at pain clinic.   August 16, 2010 10:44 AM Not seen since 2010. The pt was ok until 2 months ago,  started getting congested.   Now more dyspneic. Notes more excess mucus.  No chest pain No f/c/s. Pt cannot afford meds   Preventive Screening-Counseling & Management  Alcohol-Tobacco     Smoking Status: quit > 6 months     Year Quit: 1994     Pack years: 78yrs, 1ppd   Current Medications (verified): 1)  Aspirin 325 Mg Tabs (Aspirin) .... Take 1 Tablet By Mouth Once A Day 2)  Flomax 0.4 Mg  Cp24 (Tamsulosin Hcl) .Marland Kitchen.. 1 By Mouth Daily 3)  Crestor 20 Mg Tabs (Rosuvastatin Calcium) .... Take 1 Tablet By Mouth Once A Day 4)  Alprazolam 0.5 Mg Tbdp (Alprazolam) .Marland Kitchen.. 1 By Mouth Tis As Needed 5)  Gabapentin 400 Mg Caps (Gabapentin) .... Take 1 Capsule By Mouth Three Times A Day 6)  Galantamine Hydrobromide 24 Mg Xr24h-Cap (Galantamine Hydrobromide) .... Take 1 Capsule By Mouth Once A Day 7)  Benazepril Hcl 20 Mg Tabs (Benazepril Hcl) .... Take 1 Tablet By Mouth Once A Day 8)  Hydrochlorothiazide 25 Mg Tabs (Hydrochlorothiazide) .... 1/2 Tablet Once Daily 9)  Sodium Bicarbonate 325 Mg Tabs (Sodium Bicarbonate) .... Take 1 Tablet By Mouth Two Times A Day 10)  Multivitamins  Tabs (Multiple Vitamin) .... Take 1 Tablet By Mouth Once A Day 11)  Prilosec 20 Mg Cpdr (Omeprazole) .... Take 1 Tablet By Mouth Once A Day 12)  Tramadol Hcl 50 Mg Tabs  (Tramadol Hcl) .... Take 1 Tablet By Mouth Three Times A Day As Needed 13)  Finasteride 5 Mg Tabs (Finasteride) .... Take 1 Tablet By Mouth Once A Day  Allergies (verified): No Known Drug Allergies  Past History:  Past medical, surgical, family and social histories (including risk factors) reviewed, and no changes noted (except as noted below).  Past Medical History: Reviewed history from 04/19/2009 and no changes required. RENAL INSUFFICIENCY (ICD-588.9) PERIPHERAL VASCULAR DISEASE (ICD-443.9) HYPERTENSION (ICD-401.9) HYPERLIPIDEMIA (ICD-272.4) GERD (ICD-530.81) CORONARY ARTERY DISEASE (ICD-414.00)    -CABG 1997 COPD (ICD-496) MONOCLONAL GAMMOPATHY (ICD-273.1)    Past Surgical History: CABG 1997 L CEA 2002 back surgery 2011  Family History: Reviewed history and no changes required.  Social History: Reviewed history from 08/05/2007 and no changes required. Patient states former smoker.  Quit in 1980s.  1/2 ppd x 40 yrs  Review of Systems       The patient complains of shortness of breath with activity and productive cough.  The patient denies shortness of breath at rest, non-productive cough, coughing up blood, chest pain, irregular heartbeats, acid heartburn, indigestion, loss of appetite, weight change, abdominal pain, difficulty swallowing, sore throat, tooth/dental problems, headaches, nasal congestion/difficulty breathing through nose, sneezing, itching, ear ache, anxiety, depression, hand/feet swelling, joint stiffness or pain, rash, change in color of mucus, and fever.    Vital Signs:  Patient  profile:   77 year old male Height:      69 inches Weight:      178.38 pounds BMI:     26.44 O2 Sat:      99 % on Room air Temp:     97.4 degrees F oral Pulse rate:   72 / minute BP sitting:   130 / 74  (left arm) Cuff size:   regular  Vitals Entered By: Gweneth Dimitri RN (August 16, 2010 10:29 AM)  O2 Flow:  Room air CC: Acute Visit.  Last seen 03/2009.  c/o chest  congestion with yellow, runny nose, chest tightness, increased SOB with any actiivty x 2 months.   Comments Medications reviewed with patient Daytime contact number verified with patient. Gweneth Dimitri RN  August 16, 2010 10:30 AM    Physical Exam  Additional Exam:  GENERAL:  A/Ox3; pleasant & cooperative.NAD HEENT:  [place]/AT, EOM-wnl, PERRLA, EACs-clear, TMs-wnl, NOSE-clear, THROAT-clear & wnl. NECK:  Supple w/ fair ROM; no JVD; normal carotid impulses w/o bruits; no thyromegaly or nodules palpated; no lymphadenopathy. CHEST: insp /exp wheezes, scattered rhonchi  HEART:  RRR, no m/r/g  heard ABDOMEN:  Soft & nt; nml bowel sounds; no organomegaly or masses detected. EXT: Warm bilat,  no calf pain, trace edema, clubbing, pulses intact Skin: no rash/lesion    Impression & Recommendations:  Problem # 1:  C O P D WITH ACUTE EXACERBATION (ICD-491.21) Assessment Deteriorated due to lack of funds this pt stopped maintainence meds plan symbicort samples pulse pred avelox x 5 days  Orders: Est. Patient Level IV (78295)  Medications Added to Medication List This Visit: 1)  Aspirin 325 Mg Tabs (Aspirin) .... Take 1 tablet by mouth once a day 2)  Crestor 20 Mg Tabs (Rosuvastatin calcium) .... Take 1 tablet by mouth once a day 3)  Gabapentin 400 Mg Caps (Gabapentin) .... Take 1 capsule by mouth three times a day 4)  Galantamine Hydrobromide 24 Mg Xr24h-cap (Galantamine hydrobromide) .... Take 1 capsule by mouth once a day 5)  Benazepril Hcl 20 Mg Tabs (Benazepril hcl) .... Take 1 tablet by mouth once a day 6)  Hydrochlorothiazide 25 Mg Tabs (Hydrochlorothiazide) .... 1/2 tablet once daily 7)  Sodium Bicarbonate 325 Mg Tabs (Sodium bicarbonate) .... Take 1 tablet by mouth two times a day 8)  Multivitamins Tabs (Multiple vitamin) .... Take 1 tablet by mouth once a day 9)  Prilosec 20 Mg Cpdr (Omeprazole) .... Take 1 tablet by mouth once a day 10)  Tramadol Hcl 50 Mg Tabs (Tramadol hcl) ....  Take 1 tablet by mouth three times a day as needed 11)  Finasteride 5 Mg Tabs (Finasteride) .... Take 1 tablet by mouth once a day 12)  Symbicort 160-4.5 Mcg/act Aero (Budesonide-formoterol fumarate) .... Two puffs twice daily 13)  Prednisone 10 Mg Tabs (Prednisone) .... Take as directed take 4 daily for two days, then 3 daily for two days, then two daily for two days then one daily for two days then stop 14)  Avelox 400 Mg Tabs (Moxifloxacin hcl) .... By mouth daily  Complete Medication List: 1)  Aspirin 325 Mg Tabs (Aspirin) .... Take 1 tablet by mouth once a day 2)  Flomax 0.4 Mg Cp24 (Tamsulosin hcl) .Marland Kitchen.. 1 by mouth daily 3)  Crestor 20 Mg Tabs (Rosuvastatin calcium) .... Take 1 tablet by mouth once a day 4)  Alprazolam 0.5 Mg Tbdp (Alprazolam) .Marland Kitchen.. 1 by mouth tis as needed 5)  Gabapentin 400 Mg Caps (Gabapentin) .Marland KitchenMarland KitchenMarland Kitchen  Take 1 capsule by mouth three times a day 6)  Galantamine Hydrobromide 24 Mg Xr24h-cap (Galantamine hydrobromide) .... Take 1 capsule by mouth once a day 7)  Benazepril Hcl 20 Mg Tabs (Benazepril hcl) .... Take 1 tablet by mouth once a day 8)  Hydrochlorothiazide 25 Mg Tabs (Hydrochlorothiazide) .... 1/2 tablet once daily 9)  Sodium Bicarbonate 325 Mg Tabs (Sodium bicarbonate) .... Take 1 tablet by mouth two times a day 10)  Multivitamins Tabs (Multiple vitamin) .... Take 1 tablet by mouth once a day 11)  Prilosec 20 Mg Cpdr (Omeprazole) .... Take 1 tablet by mouth once a day 12)  Tramadol Hcl 50 Mg Tabs (Tramadol hcl) .... Take 1 tablet by mouth three times a day as needed 13)  Finasteride 5 Mg Tabs (Finasteride) .... Take 1 tablet by mouth once a day 14)  Symbicort 160-4.5 Mcg/act Aero (Budesonide-formoterol fumarate) .... Two puffs twice daily 15)  Prednisone 10 Mg Tabs (Prednisone) .... Take as directed take 4 daily for two days, then 3 daily for two days, then two daily for two days then one daily for two days then stop 16)  Avelox 400 Mg Tabs (Moxifloxacin hcl) .... By  mouth daily  Patient Instructions: 1)  Avelox one daily for 5days (use samples) 2)  symbicort two puff twice daily 3)  Prednisone 10mg  Take 4 daily for two days, then 3 daily for two days, then two daily for two days then one daily for two days then stop  (sent to pharmacy) 4)  return 4 months  Prescriptions: PREDNISONE 10 MG  TABS (PREDNISONE) Take as directed Take 4 daily for two days, then 3 daily for two days, then two daily for two days then one daily for two days then stop  #20 x 0   Entered and Authorized by:   Storm Frisk MD   Signed by:   Storm Frisk MD on 08/16/2010   Method used:   Electronically to        [facility]* (retail)       [address] [place], [place]  [address]       Ph: [phone]       Fax: [phone]   RxID:   [id number] SYMBICORT 160-4.5 MCG/ACT  AERO (BUDESONIDE-FORMOTEROL FUMARATE) Two puffs twice daily  #1 x 6   Entered and Authorized by:   Storm Frisk MD   Signed by:   Storm Frisk MD on 08/16/2010   Method used:   Electronically to        [facility] (retail)       [address] [place], [place]  [address]       Ph: [phone]       Fax: [phone]   RxID:   [id number]     Immunization History:  Influenza Immunization History:    Influenza:  historical (04/22/2010)   Appended Document: Pulmonary OV fax Tally Joe

## 2010-09-04 ENCOUNTER — Encounter: Payer: Medicare Other | Attending: Physical Medicine & Rehabilitation

## 2010-09-04 ENCOUNTER — Ambulatory Visit: Payer: Self-pay | Admitting: Physical Medicine & Rehabilitation

## 2010-09-04 DIAGNOSIS — M549 Dorsalgia, unspecified: Secondary | ICD-10-CM | POA: Insufficient documentation

## 2010-09-04 DIAGNOSIS — M48061 Spinal stenosis, lumbar region without neurogenic claudication: Secondary | ICD-10-CM | POA: Insufficient documentation

## 2010-09-04 DIAGNOSIS — R0602 Shortness of breath: Secondary | ICD-10-CM | POA: Insufficient documentation

## 2010-09-04 DIAGNOSIS — F411 Generalized anxiety disorder: Secondary | ICD-10-CM | POA: Insufficient documentation

## 2010-09-04 DIAGNOSIS — R110 Nausea: Secondary | ICD-10-CM | POA: Insufficient documentation

## 2010-09-04 DIAGNOSIS — Z79899 Other long term (current) drug therapy: Secondary | ICD-10-CM | POA: Insufficient documentation

## 2010-09-04 DIAGNOSIS — IMO0002 Reserved for concepts with insufficient information to code with codable children: Secondary | ICD-10-CM | POA: Insufficient documentation

## 2010-09-04 DIAGNOSIS — R209 Unspecified disturbances of skin sensation: Secondary | ICD-10-CM | POA: Insufficient documentation

## 2010-09-04 DIAGNOSIS — M79609 Pain in unspecified limb: Secondary | ICD-10-CM | POA: Insufficient documentation

## 2010-09-12 ENCOUNTER — Ambulatory Visit: Payer: Self-pay | Admitting: Physical Medicine & Rehabilitation

## 2010-09-15 ENCOUNTER — Ambulatory Visit (HOSPITAL_BASED_OUTPATIENT_CLINIC_OR_DEPARTMENT_OTHER): Payer: PRIVATE HEALTH INSURANCE | Admitting: Physical Medicine & Rehabilitation

## 2010-09-15 DIAGNOSIS — M48061 Spinal stenosis, lumbar region without neurogenic claudication: Secondary | ICD-10-CM

## 2010-09-15 DIAGNOSIS — IMO0002 Reserved for concepts with insufficient information to code with codable children: Secondary | ICD-10-CM

## 2010-10-04 LAB — BASIC METABOLIC PANEL
BUN: 27 mg/dL — ABNORMAL HIGH (ref 6–23)
CO2: 25 mEq/L (ref 19–32)
Calcium: 9.1 mg/dL (ref 8.4–10.5)
Chloride: 109 mEq/L (ref 96–112)
Creatinine, Ser: 2.13 mg/dL — ABNORMAL HIGH (ref 0.4–1.5)
GFR calc Af Amer: 37 mL/min — ABNORMAL LOW (ref >60)
GFR calc non Af Amer: 30 mL/min — ABNORMAL LOW (ref >60)
Glucose, Bld: 89 mg/dL (ref 70–99)
Potassium: 4.5 mEq/L (ref 3.5–5.1)
Sodium: 138 mEq/L (ref 135–145)

## 2010-10-04 LAB — URINALYSIS, ROUTINE W REFLEX MICROSCOPIC
Bilirubin Urine: NEGATIVE
Hgb urine dipstick: NEGATIVE
Specific Gravity, Urine: 1.02 (ref 1.005–1.030)
Urobilinogen, UA: 0.2 mg/dL (ref 0.0–1.0)
pH: 6 (ref 5.0–8.0)

## 2010-10-04 LAB — POCT CARDIAC MARKERS
CKMB, poc: 2.5 ng/mL (ref 1.0–8.0)
CKMB, poc: 3.1 ng/mL (ref 1.0–8.0)
Troponin i, poc: <0.05 ng/mL (ref 0.00–0.09)

## 2010-10-04 LAB — CBC
HCT: 33.4 % — ABNORMAL LOW (ref 39.0–52.0)
Hemoglobin: 11.6 g/dL — ABNORMAL LOW (ref 13.0–17.0)
MCH: 33.5 pg (ref 26.0–34.0)
MCHC: 34.6 g/dL (ref 30.0–36.0)
MCV: 96.7 fL (ref 78.0–100.0)
Platelets: 145 10*3/uL — ABNORMAL LOW (ref 150–400)
RBC: 3.45 MIL/uL — ABNORMAL LOW (ref 4.22–5.81)
RDW: 14.5 % (ref 11.5–15.5)
WBC: 5.1 10*3/uL (ref 4.0–10.5)

## 2010-10-04 LAB — DIFFERENTIAL
Basophils Absolute: 0 10*3/uL (ref 0.0–0.1)
Lymphocytes Relative: 34 % (ref 12–46)
Lymphs Abs: 1.7 10*3/uL (ref 0.7–4.0)
Monocytes Absolute: 0.5 10*3/uL (ref 0.1–1.0)
Neutro Abs: 2.7 10*3/uL (ref 1.7–7.7)

## 2010-10-05 LAB — POCT I-STAT, CHEM 8
HCT: 39 % (ref 39.0–52.0)
Hemoglobin: 13.3 g/dL (ref 13.0–17.0)
Sodium: 141 mEq/L (ref 135–145)
TCO2: 25 mmol/L (ref 0–100)

## 2010-10-05 LAB — URINE MICROSCOPIC-ADD ON

## 2010-10-05 LAB — URINALYSIS, ROUTINE W REFLEX MICROSCOPIC
Glucose, UA: NEGATIVE mg/dL
Hgb urine dipstick: NEGATIVE
Specific Gravity, Urine: 1.017 (ref 1.005–1.030)

## 2010-10-08 LAB — COMPREHENSIVE METABOLIC PANEL
ALT: 17 U/L (ref 0–53)
BUN: 26 mg/dL — ABNORMAL HIGH (ref 6–23)
CO2: 25 mEq/L (ref 19–32)
Calcium: 9.9 mg/dL (ref 8.4–10.5)
GFR calc non Af Amer: 33 mL/min — ABNORMAL LOW (ref >60)
Glucose, Bld: 77 mg/dL (ref 70–99)
Sodium: 140 mEq/L (ref 135–145)
Total Protein: 7.4 g/dL (ref 6.0–8.3)

## 2010-10-08 LAB — URINALYSIS, ROUTINE W REFLEX MICROSCOPIC
Bilirubin Urine: NEGATIVE
Glucose, UA: NEGATIVE mg/dL
Ketones, ur: NEGATIVE mg/dL
Protein, ur: NEGATIVE mg/dL
pH: 7.5 (ref 5.0–8.0)

## 2010-10-08 LAB — PROTIME-INR
INR: 1.08 (ref 0.00–1.49)
Prothrombin Time: 13.9 seconds (ref 11.6–15.2)

## 2010-10-08 LAB — DIFFERENTIAL
Basophils Relative: 1 % (ref 0–1)
Eosinophils Absolute: 0.2 10*3/uL (ref 0.0–0.7)
Lymphs Abs: 2.3 10*3/uL (ref 0.7–4.0)
Monocytes Relative: 9 % (ref 3–12)
Neutro Abs: 3.3 10*3/uL (ref 1.7–7.7)
Neutrophils Relative %: 52 % (ref 43–77)

## 2010-10-08 LAB — APTT: aPTT: 30 seconds (ref 24–37)

## 2010-10-08 LAB — CREATININE, SERUM: GFR calc non Af Amer: 36 mL/min — ABNORMAL LOW (ref >60)

## 2010-10-08 LAB — CBC
RBC: 3.85 MIL/uL — ABNORMAL LOW (ref 4.22–5.81)
WBC: 6.4 10*3/uL (ref 4.0–10.5)

## 2010-10-08 LAB — POTASSIUM: Potassium: 5.1 mEq/L (ref 3.5–5.1)

## 2010-10-13 NOTE — Assessment & Plan Note (Signed)
REASON FOR VISIT:  Back pain, lower extremity pain.  HISTORY:  A 77 year old male with neurogenic claudication, underwent L5- S1 microdiskectomy, decompressive hemilaminectomy, L5-S1 per Dr. Yetta Barre on February 02, 2010.  He had been walking about 2 miles a day when I saw him in September 2011, now he is down about to 3/4 mile.  He states that he is getting pain in his legs after that time.  He is using a tramadol and gabapentin for pain.  CURRENT DOSAGE:  Tramadol 50 t.i.d. as well as gabapentin 400 t.i.d.  He is requesting a handicap sticker.  He does not use any assistive device for ambulation.  He has no oxygen or other cardiac or pulmonary condition that limits his ambulation.  His average pain is 5/10, described as burning.  He can walk as noted he states 50 minutes at a time, then he states he is about a 1/4 mile.  I am not sure whether he can actually go a little further than that.  He climbs steps.  He can drive.  REVIEW OF SYSTEMS:  Positive for numbness, tingling, anxiety, nausea, poor appetite, shortness of breath.  PHYSICAL EXAMINATION:  VITAL SIGNS:  Blood pressure is 119/68, pulse is 60, respirations 18, O2 sat 99% on room air. GENERAL:  Mood and affect are appropriate. BACK:  His back has no tenderness to palpation. MUSCULOSKELETAL:  His gait is mildly forward flexed at the hips.  Lower extremity strength is normal.  He has a decreased left S1 reflex.  IMPRESSION: 1. Lumbar spinal stenosis. 2. Chronic left S1 radiculopathy.  PLAN: 1. Continue current medications. 2. I will see him back in 6 months.  He can actually be seen by the     PA.  Should he have any further decline in his ambulatory distance,     may need followup with Dr. Yetta Barre to see if he is developing any     other level symptomatology.  We had previously done ABIs on him on January 11, 2010, and this was fine. He does not really qualify for handicap sticker at this time.     Erick Colace,  M.D. Electronically Signed    AEK/MedQ D:  09/15/2010 14:06:26  T:  09/15/2010 20:49:07  Job #:  [id number]  cc:   Tia Alert, MD Fax: [phone]

## 2010-10-17 ENCOUNTER — Emergency Department (HOSPITAL_COMMUNITY)
Admission: EM | Admit: 2010-10-17 | Discharge: 2010-10-17 | Disposition: A | Discharge status: Home / Self Care | Payer: Medicare Other | Attending: Emergency Medicine | Admitting: Emergency Medicine

## 2010-10-17 ENCOUNTER — Emergency Department (HOSPITAL_COMMUNITY): Payer: Medicare Other

## 2010-10-17 DIAGNOSIS — J449 Chronic obstructive pulmonary disease, unspecified: Secondary | ICD-10-CM | POA: Insufficient documentation

## 2010-10-17 DIAGNOSIS — I10 Essential (primary) hypertension: Secondary | ICD-10-CM | POA: Insufficient documentation

## 2010-10-17 DIAGNOSIS — I2510 Atherosclerotic heart disease of native coronary artery without angina pectoris: Secondary | ICD-10-CM | POA: Insufficient documentation

## 2010-10-17 DIAGNOSIS — R509 Fever, unspecified: Secondary | ICD-10-CM | POA: Insufficient documentation

## 2010-10-17 DIAGNOSIS — J4489 Other specified chronic obstructive pulmonary disease: Secondary | ICD-10-CM | POA: Insufficient documentation

## 2010-10-17 DIAGNOSIS — R112 Nausea with vomiting, unspecified: Secondary | ICD-10-CM | POA: Insufficient documentation

## 2010-10-17 LAB — URINALYSIS, ROUTINE W REFLEX MICROSCOPIC
Nitrite: NEGATIVE
Specific Gravity, Urine: 1.025 (ref 1.005–1.030)
Urobilinogen, UA: 0.2 mg/dL (ref 0.0–1.0)
pH: 5.5 (ref 5.0–8.0)

## 2010-10-17 LAB — DIFFERENTIAL
Basophils Absolute: 0 10*3/uL (ref 0.0–0.1)
Basophils Relative: 0 % (ref 0–1)
Monocytes Absolute: 0.7 10*3/uL (ref 0.1–1.0)
Neutro Abs: 3.9 10*3/uL (ref 1.7–7.7)
Neutrophils Relative %: 58 % (ref 43–77)

## 2010-10-17 LAB — CBC
Hemoglobin: 12.9 g/dL — ABNORMAL LOW (ref 13.0–17.0)
MCHC: 34.8 g/dL (ref 30.0–36.0)
RBC: 3.9 MIL/uL — ABNORMAL LOW (ref 4.22–5.81)
WBC: 6.8 10*3/uL (ref 4.0–10.5)

## 2010-10-17 LAB — COMPREHENSIVE METABOLIC PANEL
ALT: 22 U/L (ref 0–53)
AST: 30 U/L (ref 0–37)
CO2: 27 mEq/L (ref 19–32)
Calcium: 9.6 mg/dL (ref 8.4–10.5)
GFR calc Af Amer: 46 mL/min — ABNORMAL LOW (ref >60)
GFR calc non Af Amer: 38 mL/min — ABNORMAL LOW (ref >60)
Potassium: 4.7 mEq/L (ref 3.5–5.1)
Sodium: 137 mEq/L (ref 135–145)

## 2010-10-26 ENCOUNTER — Other Ambulatory Visit: Payer: Self-pay | Admitting: Gastroenterology

## 2010-11-07 LAB — CARDIAC PANEL(CRET KIN+CKTOT+MB+TROPI)
CK, MB: 2.3 ng/mL (ref 0.3–4.0)
CK, MB: 3.3 ng/mL (ref 0.3–4.0)
Relative Index: 2.2 (ref 0.0–2.5)
Relative Index: 2.2 (ref 0.0–2.5)
Total CK: 106 U/L (ref 7–232)
Troponin I: <0.01 ng/mL (ref 0.00–0.06)

## 2010-11-07 LAB — COMPREHENSIVE METABOLIC PANEL
ALT: 68 U/L — ABNORMAL HIGH (ref 0–53)
Alkaline Phosphatase: 54 U/L (ref 39–117)
CO2: 26 mEq/L (ref 19–32)
GFR calc non Af Amer: 42 mL/min — ABNORMAL LOW (ref >60)
Glucose, Bld: 108 mg/dL — ABNORMAL HIGH (ref 70–99)
Potassium: 4.5 mEq/L (ref 3.5–5.1)
Sodium: 139 mEq/L (ref 135–145)

## 2010-11-07 LAB — BASIC METABOLIC PANEL
BUN: 19 mg/dL (ref 6–23)
Calcium: 8.7 mg/dL (ref 8.4–10.5)
Creatinine, Ser: 2.03 mg/dL — ABNORMAL HIGH (ref 0.4–1.5)
GFR calc non Af Amer: 32 mL/min — ABNORMAL LOW (ref >60)
Glucose, Bld: 129 mg/dL — ABNORMAL HIGH (ref 70–99)

## 2010-11-07 LAB — CBC
Hemoglobin: 12 g/dL — ABNORMAL LOW (ref 13.0–17.0)
RBC: 3.48 MIL/uL — ABNORMAL LOW (ref 4.22–5.81)

## 2010-11-07 LAB — PROTIME-INR: INR: 1 (ref 0.00–1.49)

## 2010-11-07 LAB — APTT: aPTT: 30 seconds (ref 24–37)

## 2010-11-09 ENCOUNTER — Other Ambulatory Visit (HOSPITAL_COMMUNITY): Payer: Self-pay | Admitting: Gastroenterology

## 2010-11-09 DIAGNOSIS — R112 Nausea with vomiting, unspecified: Secondary | ICD-10-CM

## 2010-11-21 ENCOUNTER — Encounter (HOSPITAL_COMMUNITY)
Admission: RE | Admit: 2010-11-21 | Discharge: 2010-11-21 | Disposition: A | Discharge status: Home / Self Care | Payer: Medicare Other | Source: Ambulatory Visit | Attending: Gastroenterology | Admitting: Gastroenterology

## 2010-11-21 DIAGNOSIS — R112 Nausea with vomiting, unspecified: Secondary | ICD-10-CM

## 2010-11-21 MED ORDER — TECHNETIUM TC 99M SULFUR COLLOID
2.0000 | Freq: Once | INTRAVENOUS | Status: AC | PRN
  Administered 2010-11-21: 2 via INTRAVENOUS

## 2010-12-05 NOTE — Group Therapy Note (Signed)
[name] [name] is a 77 year old male who is followed in this clinic from  2005-2007 per Dr. Thomasena Edis.  He has a history of coronary artery bypass  graft in 1997, has had chronic pain since that time.  He was treated by  Dr. Thomasena Edis with hydrocodone which he basically took 1-3 tablets per day  #90 with the last prescription.  He states that he quit coming because  he did not think it was really helping.  He never tried any physical  therapy.  He has never tried any other type of treatment other than the  above.   He had some constipation problems when he was on narcotic analgesics.  In the interval time period, [name] [name] has had urethral stricture  surgery per Dr. Retta Diones in October 2009.  He had a renal artery stent  placed in 2007.  He had hospitalization for chest pain March 2008, which  found it to be noncardiac.  He had a cardiac cath at that time which was  negative and showed an intact stent.  A VQ scan was negative for PE.  More recently he in February 2010 had a myocardial imaging with spec  scan which showed no evidence of ischemia with stress test.   His pain is in the 7-9/10 range mainly a hypersensitivity can even let  certain materials touches scar.  His pain is interfering with him at a  moderate level.  He can walk 50 minutes at a time, climb steps.  He  drives.  He was last employed in 2004.   His review of systems is positive for anxiety, tingling, nausea, urinary  retention, poor appetite, and shortness of breath.   SOCIAL HISTORY:  Married, lives with his wife.   FAMILY HISTORY:  High blood pressure.   PHYSICAL EXAMINATION:  VITAL SIGNS:  Blood pressure is 154/74, pulse 65,  respiratory rate is 18, O2 sat 99% room air.  Orientation x3.  Affect  alert.  Gait is normal.  EXTREMITIES:  Without edema.  Coordination normal.  Deep tendon reflexes  are normal.  He does have some tightness in his pectoralis in fact had  some pain when he stretches his arms over his head or  behind his head in  the left pectoral region about 10 cm lateral of the sternum.  His heart  has regular rate and rhythm.  Motor strength is full in upper lower  extremities.  He has pain with even brushing of the skin.  He has a  hypertrophic scar along the sternotomy site.  LUNGS:  Clear.   IMPRESSION:  Chronic postoperative pain mainly incisional appears to be  mainly neuropathic, but also has a myofascial component.  We will start  Voltaren gel for costochondral components.  Consider some Neurontin as a  add on for neuropathic component if needed.  Send him to physical  therapy for scar desensitization as well as parascapular strengthening  and pectoralis stretching.   Discussed this with the patient.  Hopefully, I will be able to avoid  narcotic analgesics and in fact with some correction of his abnormal  biomechanics and desensitization, hope to manage this without  medications.      Erick Colace, M.D.  Electronically Signed     AEK/MedQ  D:  11/18/2008 15:53:27  T:  11/19/2008 04:44:18  Job #:  [id number]   cc:   Tally Joe, M.D.  Fax: [phone]   Tamera C. Charma IgoP.

## 2010-12-05 NOTE — Op Note (Signed)
NAME:  [name] [name], [name] [name] NO.:  [id number]   MEDICAL RECORD NO.:  [id number]          PATIENT TYPE:  AMB   LOCATION:  [facility]                         FACILITY:  [facility]   PHYSICIAN:  Martina Sinner, MD DATE OF BIRTH:  04/03/1931   DATE OF PROCEDURE:  05/11/2008  DATE OF DISCHARGE:                               OPERATIVE REPORT   PREOPERATIVE DIAGNOSIS:  Urethral stricture.   POSTOPERATIVE DIAGNOSIS:  Urethral stricture plus meatal stenosis.   SURGERY:  Dilation of urethral stricture; dilation of meatal stenosis  and cystoscopy.   [name] has a symptomatic ureteral stricture.  He has a semi-  rigid rod prosthesis.   Patient is prepped and draped in the usual fashion.  He is given  preoperative antibiotics.  A 21 scope was utilized for the examination.  He had mild meatal stenosis.  I dilated him gently from a 54 [place] to a  36 [place] with male sounds.  There was no bleeding.  The scope was  easily inserted, and it was well-lubricated.  He had an approximately 10-  French stricture in the distal bulbar urethra.  I easily passed the  sensor wire under fluoroscopic guidance up into the bladder.  I could  see the corporal bodies.  I then passed a well-lubricated balloon  dilation catheter to the mid prostatic urethra.  I filled up the balloon  to 18 atmospheres of pressure for 5 minutes.  I removed the balloon.  With the wire in place, I then scoped with a 17 [place] scope, and it  would easily enter the bladder.  The wire was removed.   It was difficult to inspect the bladder in total, since he had a high-  riding middle lobe and a penile prosthesis.  I did not want to cause  injury to the urethra.  Bladder mucosa and trigone were normal.  There  is no question he had a moderate-sized middle lobe.  He had bilobar  enlargement of the prostate.  Prostatic urethra is 2.5 cm in length.  The verumontanum was identified.  The proximal bulbar urethra was  normal.   He had excellent dilation of the distal bulbar urethral  stricture to approximately a 24 [place].  The rest of the penile urethra  was normal.  There is very minimal bleeding.  In my opinion, there is no  clinical reason to leave a Foley catheter, which was preferable to the  patient.   I cystoscoped the patient again, and in my opinion, he did not need a  Foley catheter.   Bladder was emptied.  Patient was taken to the recovery room and will be  followed as per protocol.   Importantly, it would be difficult to do a TURP based upon his anatomy  and rigidity from the semi-rigid rod prosthesis and his relatively less  elastic and smaller urethra with the corporal bodies being  nondistendable.           ______________________________  Martina Sinner, MD  Electronically Signed    SAM/MEDQ  D:  05/11/2008  T:  05/11/2008  Job:  [id number]

## 2010-12-05 NOTE — Assessment & Plan Note (Signed)
[facility]                             PULMONARY OFFICE NOTE   NAME:JONESSipriano, [name]                        MRN:          [id number]  DATE:03/26/2007                            DOB:          [birth year]    [name] returns in followup.  This is a 77 year old African-American  male with a history of chronic obstructive lung disease asthmatic  bronchitic component.  Overall, the patient's level of dyspnea is  stable.  He is having no active cough or mucus production.   MEDICATIONS:  1. He maintains Advair 500/50 one spray b.i.d.  2. Maintains Zegerid 40 mg daily.   EXAM:  Temperature 97, blood pressure 140/88, pulse 56, saturation 99%  on room air.  CHEST:  Showed diminished breath sounds with prolonged expiratory phase.  No wheeze or rhonchi.  CARDIAC:  Shows a regular rate and rhythm without S3.  Normal S1, S2.  ABDOMEN:  Soft and nontender.  EXTREMITIES:  No edema or clubbing.  SKIN:  Clear.  NEUROLOGIC:  Intact.  HEENT:  No jugular venous distension.  No lymphadenopathy.  Oropharynx  clear.  NECK:  Supple.   IMPRESSION:  Chronic obstructive lung disease with asthmatic bronchitic  component, stable at this time.   PLAN:  To maintain inhaled medicines as currently prescribed.  We will  see the patient back in followup.     Charlcie Cradle Delford Field, MD, [facility]  Electronically Signed    PEW/MedQ  DD: 03/26/2007  DT: 03/26/2007  Job #: [id number]

## 2010-12-05 NOTE — Assessment & Plan Note (Signed)
Mr. Bady returns today, 77 year old male with chronic noncardiac chest  pain.  He has had pain since coronary artery bypass grafting with  hypersensitivity touch over that area.  He has gone through some  Physical Therapy with desensitization procedures as well as pectoralis  stretching and deep breathing exercises without significant improvement.  His pain has improved, however, with the initiation of gabapentin of 100  mg q.i.d..  He has no other medical problems in the interval time period  since I last saw him on December 21, 2008.   His pain score is 3-4/10 range.  He can walk 15 minutes at a time.  He  does on a regular basis.  He climbs steps.  He drives.  His Oswestry  disability index is mild at 18%.   SOCIAL HISTORY:  Married, lives with his wife.  He is retired.   PHYSICAL EXAMINATION:  VITAL SIGNS:  Blood pressure 143/72, pulse 63,  respirations 18, and O2 sat 100% on room air.  GENERAL:  Well-developed, well-nourished male in no acute stress.  Orientation x3.  Affect is bright and alert.  Gait is normal.  HEART:  Regular rate and rhythm.  LUNGS:  Clear.  He has some hypersensitivity touch over the scar area as  well as over the pectoralis insertion site.  EXTREMITIES:  He has full range of motion in upper extremities and full  diaphragmatic excursion without pain.   IMPRESSION:  Chronic postoperative pain, status post coronary artery  bypass graft.  We will continue current medication regimen, which  includes his Lidoderm patch over the area at night, Neurontin 100 mg  q.i.d.  I will see him back in 4 months and if he is doing pretty well  at that point may have him followup just with his primary doctor on the  current medications.      Erick Colace, M.D.  Electronically Signed     AEK/MedQ  D:  03/08/2009 09:51:03  T:  03/08/2009 11:31:07  Job #:  [id number]   cc:   Armanda Magic, M.D.  Fax: [phone]   Tally Joe, M.D.  Fax: [phone]

## 2010-12-05 NOTE — Assessment & Plan Note (Signed)
[name] [name] returns today.  He is a 77 year old male with a chronic  noncardiac chest pain.  He has had pain since coronary artery bypass  graft in 1997, hypersensitivity over that area.  The patient has gone  through some physical therapy.  They tried some desensitization  procedures as well as pectoralis stretching and deep breathing  exercises, also working on upper back, but to no avail.  The patient  reports his pain is 6/10 on average.  His pain score at my first visit  was actually averaging 9/10, so perhaps he does have a little bit more  positive benefit than he reported previously.  He states that some of  the exercises that they gave him he was already doing.  He has had no  other new medications taken.  I went over his medications with him  specifically in regards to the alprazolam that did not show up on the  urine drug screen, but was reported as one of his medicines.  Looking at  his prescription, he only received 20 and a prescription that was dated  February 2010, he states that he takes them on very rare occasion.  He  actually still has 2 tablets left from that time.  He takes a half  tablet on an occasional basis.  This may account for the urine drug  screen being negative for that particular drug.   His other review of systems positive for tingling, depression, and  anxiety.  Functionally, he is independent, driving, singing in the  church choir.  He has some shortness of breath and wheezing at times.  He is married, lives with his wife.   FAMILY HISTORY:  Heart disease, high blood pressure.   PHYSICAL EXAMINATION:  Blood pressure 119/61, pulse 61, respirations 18,  and O2 sat 99% on room air.   General, no acute distress.  Mood and affect appropriate.  His chest has  tenderness to even very light palpation, has some tenderness over the  pectoralis at the midclavicular line and proceeding medially.  He has  scar hypersensitivity with keloid formation.  His upper  extremity  strength is normal.  His heart is regular rate and rhythm.  Lungs are  clear to auscultation.  Extremities without edema.  Gait is normal.   IMPRESSION:  Chronic postoperative pain post sternotomy syndrome.  He  has a strong neuropathic component to his pain with hypersensitivity.  We will reinstitute some Lidoderm which he will use at night.  During  the day, we will use Voltaren gel and then start him on some Neurontin.  I will see him back in 1 month and I think he can be treated with  narcotics if need be given that his alprazolam was only 20 tablets and  he is taking it infrequently.      Erick Colace, M.D.  Electronically Signed     AEK/MedQ  D:  12/21/2008 15:06:48  T:  12/22/2008 04:29:58  Job #:  [id number]   cc:   Armanda Magic, M.D.  Fax: [phone]   Tally Joe, M.D.  Fax: [phone]

## 2010-12-05 NOTE — Assessment & Plan Note (Signed)
[facility]                             PULMONARY OFFICE NOTE   NAME:[name] [name], [name] [name]                        MRN:          [id number]  DATE:11/29/2006                            DOB:          [birth year]    Mr. Hollomon is a 77 year old, African-American male with a history of  chronic obstructive lung disease, asthmatic bronchitis, reflux disease.  The patient is still having some degree of chest congestion and  maintains Advair 500/50 one spray b.i.d., Zegerid 40 mg daily.   PHYSICAL EXAMINATION:  VITAL SIGNS:  Temperature 97, blood pressure  124/74, pulse 78, saturation 98% on room air.  CHEST:  Showed diminished breath sounds with prolonged respiratory  phase, no wheeze or rhonchi noted.  CARDIAC:  Showed a regular rate and rhythm without S3, normal S1, S2.  ABDOMEN:  Soft, nontender.  EXTREMITIES:  Showed no edema or clubbing.  SKIN:  Clear.   IMPRESSION:  Chronic obstructive lung disease with asthmatic bronchitic  component.   PLAN:  Maintain inhaled medicines with Advair twice daily and will see  the patient back in return followup in 4 months.     Charlcie Cradle Delford Field, MD, [facility]  Electronically Signed    PEW/MedQ  DD: 11/29/2006  DT: 11/30/2006  Job #: [id number]

## 2010-12-07 ENCOUNTER — Other Ambulatory Visit: Payer: Self-pay | Admitting: Gastroenterology

## 2010-12-07 DIAGNOSIS — R112 Nausea with vomiting, unspecified: Secondary | ICD-10-CM

## 2010-12-08 NOTE — Assessment & Plan Note (Signed)
[facility]                             PULMONARY OFFICE NOTE   NAME:JONESCourvoisier, Hamblen                        MRN:          [id number]  DATE:08/15/2006                            DOB:          [birth year]    Mr. Griess is a 77 year old African American male, history of chronic  obstructive lung disease, asthmatic bronchitis, hypertension previous,  coronary artery disease, bypass surgery in 1997.  He has had increased  dyspnea for the last several weeks, increasing arm pain and pain  radiating up into the jaw.  No real chest pain.  He has increased cough  with a thick white mucus.  He maintains aspirin 81 mg daily, Guaifenex  600 mg b.i.d., Flomax 0.4 mg daily, Caduet 1 daily, Prilosec over the  counter 20 mg daily, Advair 500/50 spray b.i.d., Hydrocodone APAP 5/500  b.i.d.   PHYSICAL EXAMINATION:  Temperature 97, blood pressure 120/70, pulse 70,  saturation was 98% on room air.  CHEST:  Distant breath sounds with prolonged expiratory phase, no wheeze  or rhonchi noted.  CARDIAC:  Showed a regular rate and rhythm without S3, normal S1, S2.  ABDOMEN:  Soft nontender.  EXTREMITIES:  Showed no edema or clubbing or venous disease.  NEUROLOGIC:  Intact.   IMPRESSION:  Chronic obstructive lung disease with some mild flare and  reflux with flare, underlying probably angina with history of coronary  artery disease.   RECOMMENDATIONS:  Refer back to cardiology for re-evaluation of ischemic  heart disease.  The patient received pulse Prednisone 40 mg a day  tapered down by 10 mg every 3 days until off and patient to receive  Zegerid 40 mg daily in place of the Prilosec over the counter.  We will  see the patient back in followup in one month's time.     Charlcie Cradle Delford Field, MD, [facility]  Electronically Signed    PEW/MedQ  DD: 08/15/2006  DT: 08/15/2006  Job #: [id number]   cc:   Armanda Magic, M.D.

## 2010-12-08 NOTE — Procedures (Signed)
[place]  Patient:    [name] [name], [name] [name]                        MRN: [id number] Proc. Date: 09/05/99 Adm. Date:  [id number] Attending:  Orland Mustard CC:         Meredith Staggers, M.D.                           Procedure Report  PROCEDURE:  Colonoscopy.  ENDOSCOPIST:  Llana Aliment. Randa Evens, M.D.  CURRENT MEDICATIONS:  Fentanyl 70 mcg, Versed 6 mg IV.  SCOPE:  Olympus adult video colonoscope.  INDICATIONS:  This gentleman is being evaluated for bloating, cause unclear.  A CT scan was obtained which showed a soft tissue mass in the cecum, either a prominent ileocecal valve or a large polyp or tumor mass.  Given this abnormal CT scan, a  colonoscopy is performed.  DESCRIPTION OF PROCEDURE:  The procedure was explained to the patient and a consent was obtained.  With the patient in the left lateral decubitus position, the Olympus video colonoscope was inserted and advanced under direct visualization.  The prep was excellent.  I was able to advance around to the right colon and down into the cecum using some abdominal pressure.  The ileocecal valve was very prominent and actually had a sharp angulation going into the cecum, but we were able to go right into the cecum.  The valve was completely normal with a normal opening.  Could ot enter.  No evidence of polyp, tumor, mass, etc.  The scope was withdrawn.  The colon was carefully examined on the way out.  The mucosa was normal.  No evidence of polyps or any other lesions throughout.  The patient tolerated the procedure well.  He was maintained on low-flow oxygen and pulse oximeter throughout the procedure, with no obvious problems.  ASSESSMENT: 1. Abnormal CT suggesting a soft tissue mass but the colonoscopy confirms    that this is in fact a large ileocecal valve with a sharp angulation,    with no evidence of tumor mass. 2. Internal hemorrhoids. 3. No evidence of diverticular  disease.  PLAN:  Will continue the patient on the current medicine, and will see back in he office in approximately two to three months. DD:  09/05/99 TD:  09/05/99 Job: [id number]

## 2010-12-08 NOTE — Assessment & Plan Note (Signed)
Ms. Mcquarrie returns to the clinic today for followup evaluation.  I last saw  him in this office March 15, 2004.  Unfortunately, the patient reports  still persistent mid thoracic pain at the site of his prior bypass grafting.  He is very tender to even light palpation over the mid sternal region where  his chest was splint for the surgery.  That has not really improved,  although he does get some temporary relief using the Darvocet usually 2-3  tablets per day.  He generally uses 2-3 every day with three being used on  his worst days.  He does report that he has developed back pain for the past  four months and x-rays on his back reportedly show rheumatoid arthritis.  He was started on Celebrex, and he reports that does help him with his back  pain.  It really does not help with his sternal pain.   MEDICATIONS:  1.  Aspirin 81 mg every day.  2.  Zelnorm 2 mg b.i.d.  3.  Proscar 5 mg every day.  4.  Prevacid 30 mg every day.  5.  Norvasc 10 mg every day.  6.  Darvocet-N 100 2-3 tablets every day p.r.n.   PHYSICAL EXAMINATION:  GENERAL:  A well-appearing, fit, adult male in no  acute discomfort.  He does report severe pain even when he touches his chest  region.  VITAL SIGNS:  Blood pressure was 139/74 with a pulse of 48, respiratory rate  16, and O2 saturation 99% on room air.  MUSCULOSKELETAL:  He has 5/5 strength throughout the bilateral upper and  lower extremities.  He is really reluctant to let me even touch his chest as  he reports that the pain is present in the midline but not present laterally  on either side.   IMPRESSION:  Chronic incisional pain, after coronary artery bypass grafting.   In the office today,  1.  No refill on the Darvocet was  necessary.  That was recently refilled      and he still has approximately 50 tablets remaining.  He will call for a      followup refill as necessary over the next few weeks.  2.  We will plan on seeing the patient at followup in  approximately six      months' time.      DC/MedQ  D:  08/16/2004 16:24:13  T:  08/16/2004 18:26:59  Job #:  [id number]

## 2010-12-08 NOTE — Discharge Summary (Signed)
NAME:  [name] [name], [name] [name] NO.:  [id number]   MEDICAL RECORD NO.:  [id number]          PATIENT TYPE:  OBV   LOCATION:  6522                         FACILITY:  [facility]   PHYSICIAN:  Corky Crafts, MDDATE OF BIRTH:  06/20/1931   DATE OF ADMISSION:  10/30/2005  DATE OF DISCHARGE:  10/31/2005                                 DISCHARGE SUMMARY   DISCHARGE DIAGNOSES:  1.  Left renal artery stenosis status post successful percutaneous      transluminal coronary angioplasty/stent to the left renal artery on      October 30, 2005.  2.  Known coronary artery disease.  3.  Hyperlipidemia, treated.  4.  Multiple medications usage.   HISTORY AND HOSPITAL COURSE:  Mr. Zywicki is a 77 year old male patient with  known coronary artery disease with history of coronary artery bypass  grafting who also has significant peripheral vascular disease and has  undergone a left iliac artery stent in March of 2007.  At that time the  patient was noted to have bilateral renal artery stenosis with a right 40%  proximal lesion and a left 90% ostial lesion.  He was admitted on October 30, 2005 for percutaneous transluminal coronary angioplasty/stent to the left  renal artery. He tolerated this procedure without difficulty and remained in  the hospital overnight.   The following day the patient's BUN was 13, creatinine 1.4. He was  ambulating without difficulty.  His right groin was stable with no bruit and  no ecchymosis.  Lower extremities were warm with good distal blood flow.  At  this point the patient is going to be discharged to home on his current  medications which include:   DISCHARGE MEDICATIONS:  1.  Aspirin 325 mg one p.o. daily.  2.  Plavix 75 mg one p.o. daily.  3.  He is to resume Norvasc 10 mg a day.  4.  Zetia 10 mg a day.  5.  Lipitor 40 mg daily.  6.  Clonidine 0.1 mg twice a day.  7.  Paxil 10 mg a day.  8.  Toprol XL 25 mg daily.  9.  Nexium 40 mg a day.  10. Guaifenex  twice a day.  11. Flomax once a day.  12. Darvocet if needed.  13. Meclizine if needed.   WOUND CARE:  Clean over the puncture site with soap and water.   ACTIVITY:  No driving for two days.  No lifting over 10 pounds for one week.   FOLLOW UP:  Follow up with Dr. Eldridge Dace on November 09, 2005 at 9:15 A.M.  He  is to call for any groin pain or swelling.   Addendum: I saw and examined the patient and performed the assessment and  plan on the day of discharge. -Jay Varanasi,MD      Guy Franco, P.A.      Corky Crafts, MD  Electronically Signed    LB/MEDQ  D:  10/31/2005  T:  10/31/2005  Job:  [id number]   cc:   Armanda Magic, M.D.  Fax: [phone]   Onalee Hua  Azucena Cecil, M.D.  Fax: [phone]

## 2010-12-08 NOTE — Assessment & Plan Note (Signed)
REASON FOR EVALUATION:  [name] returns to clinic today for followup  evaluation.  He is a 77 year old adult male referred for incisional chest  pain with a history of coronary artery bypass grafting.   During the last clinic visit, we had started the patient on Neurontin and he  is presently taking 300 mg nightly.  He does not report any adverse side-  effects secondary to the medication.  He does feel that there is possibly a  5% to 10% improvement in his incisional pain.   The patient reports that he continues to take his medication for his  irritable bowel, specifically his Zelnorm.  He is due for followup with Dr.  Juanetta Snow approximately September 25, 2003 in [facility].  The patient was  started on a recent Sterapred Dosepak by Dr. Azucena Kuba, who is primary care  physician.  He reports that he did gain some help with that and he is  finishing that today.   MEDICATIONS:  1. Norvasc 10 mg daily.  2. Prevacid 30 mg b.i.d.  3. Proscar 5 mg p.o. daily.  4. Zelnorm 2 g b.i.d.  5. Aspirin 81 mg daily.  6. Neurontin 300 mg nightly.  7. Prednisone Dosepak -- finishing today.   PHYSICAL EXAM:  GENERAL:  Well-appearing adult male.  VITAL SIGNS:  Blood pressure 124/65 with a pulse of 77, respiratory rate 16  and O2 saturation 98% on room air.  NEUROMUSCULAR:  Right upper extremity and right lower extremity strength are  5/5 throughout.  Left upper extremity strength was 5-/5 with normal bulk and  tone.  Reflexes were 2+ and symmetrical.  Patient still complained of pain  with light touch over his chest incisional area.   IMPRESSION:  Chronic incisional pain after coronary artery bypass grafting.   PLAN:  In the office today, I did explain to the patient that we can  certainly go up on the Neurontin medication several more times before  hitting a maximum dose.  I have asked him to increase his Neurontin to 300  mg 1 p.o. b.i.d. and have given him a prescription for that dosage; that  prescription also has 2 refills.  I will plan on seeing the patient in  followup in approximately 1 month's time to see how he is doing and we still  may need to make further increases his medication.   The patient is comfortable with this plan at the present time.      Ellwood Dense, M.D.   DC/MedQ  D:  09/16/2003 10:37:12  T:  09/16/2003 11:27:05  Job #:  [id number]

## 2010-12-08 NOTE — Discharge Summary (Signed)
NAME:  DAYVEON, HALLEY NO.:  [id number]   MEDICAL RECORD NO.:  [id number]                   PATIENT TYPE:  INP   LOCATION:  3307                                 FACILITY:  [facility]   PHYSICIAN:  Quita Skye. Hart Rochester, M.D.               DATE OF BIRTH:  12-26-1930   DATE OF ADMISSION:  07/03/2002  DATE OF DISCHARGE:  07/04/2002                                 DISCHARGE SUMMARY   </ADMISSION DIAGNOSES>  Severe left internal carotid artery stenosis.   PAST MEDICAL HISTORY:  1. Coronary artery disease, status post coronary artery bypass graft in 1997     by Dr. Laneta Simmers.  2. Peripheral vascular disease, bilateral lower extremity claudication.  3. Hypertension.  4. Hyperlipidemia.  5. Gastroesophageal reflux disease.   ALLERGIES:  The patient has no known drug allergies.   DISCHARGE DIAGNOSIS:  Severe left internal carotid artery stenosis, status  post left carotid endarterectomy.   HISTORY OF PRESENT ILLNESS:  The patient is a 77 year old Caucasian man who  was recently evaluated at the [facility] Emergency Department with chest  pain.  He did rule out for an MI at that time.  A Cardiolite performed on  June 25, 2002, was negative for cardiac ischemia.  During his admission,  a left carotid bruit was noted by the cardiologist.  On June 25, 2002,  carotid Doppler study was performed which revealed a left internal carotid  artery stenosis of 90% and a right stenosis of 70%.  He was referred to [facility]  for surgical consideration.  He was evaluated in the office by Dr. Hart Rochester on  June 09, 2002.  After examination of the patient and review of all  available records including the carotid Dopplers, Dr. Hart Rochester recommended  surgical intervention if he was cleared by cardiology.  He was cleared by  his cardiologist and surgery was scheduled.   HOSPITAL COURSE:  On July 03, 2002, the patient was electively admitted  to [facility] under the care of  Dr. Nigel Berthold. Hart Rochester.  He underwent the  following surgical procedure, left carotid endarterectomy with Dacron patch  angioplasty.  He tolerated this procedure well and was transferred in stable  condition to the PACU.  He emerged from anesthesia neurologically intact.  He has remained hemodynamically stable since surgery.  His postoperative  course has been uneventful.   On the morning of July 04, 2002, his postoperative day #1, he reports  feeling very well.  His vital signs are stable, and he is afebrile.  His  heart is in a regular rate and rhythm.  His lungs are clear.  He is  tolerating a small amount of p.o. intake.  He does not have any nausea.  He  is voiding small amounts since his Foley has been removed.  His left neck  incision is clean and dry.  No neck swelling.  No difficulty  with hearing or  swallowing.  He remains neurologically intact.  His pain is well controlled,  and he is ambulating independently in his room.  He is ready for discharge  home today.   LABORATORY DATA:  On the morning of July 04, 2002, CBC revealed white  blood cells of 5.9, hemoglobin 10.5, hematocrit 30.1, platelets 161,000.  Chemistries revealed sodium of 137, potassium 3.6, BUN 13, and creatinine  1.4.   DISCHARGE CONDITION:  Improved.   DISCHARGE MEDICATIONS:  1. Tylox one to two p.o. q.4-6h. p.r.n. pain.  2. He has been instructed to resume his home medications of Lopressor 25 mg     p.o. q.d.  3. Prevacid 30 mg p.o. q.d  4. Aspirin 81 mg p.o. q.d.   DISCHARGE INSTRUCTIONS:  1. Activity:  He has been asked to refrain from any driving or any heavy     lifting. He has also been instructed to continue his previous exercises     and daily walking.  2. Diet:  His diet should be a low fat, low salt diet.  3. Wound care:  He may shower with mild soap and water beginning Monday,     July 06, 2002.  If the incision is red, hot, swollen, draining or if     he has a fever greater 100.1  degrees he is to call to Dr. Candie Chroman     office.  4. Next followup:  He has an appointment to see Dr. Hart Rochester at the [facility]     office on Tuesday, July 21, 2002, at 10:30 in the morning.     Toribio Harbour, R.N.                  Quita Skye. Hart Rochester, M.D.    CTK/MEDQ  D:  07/04/2002  T:  07/05/2002  Job:  [id number]   cc:   Armanda Magic, M.D.  [address]  [place], [place] [address]  Fax: [phone]   Meredith Staggers, M.D.  [address], [address]  [place]  [place] [address]  Fax: [phone]

## 2010-12-08 NOTE — Procedures (Signed)
NAME:  [name] [name], [name] [name] NO.:  [id number]   MEDICAL RECORD NO.:  [id number]                   PATIENT TYPE:  REC   LOCATION:  TPC                                  FACILITY:  [facility]   PHYSICIAN:  Ellwood Dense, M.D.                DATE OF BIRTH:  1931/02/18   DATE OF PROCEDURE:  DATE OF DISCHARGE:                                 OPERATIVE REPORT   NO DICTATION.                                                Ellwood Dense, M.D.    DC/MEDQ  D:  09/16/2003 10:34:21  T:  09/16/2003 11:28:08  Job:  [id number]

## 2010-12-08 NOTE — Cardiovascular Report (Signed)
NAME:  ALDEN, BENSINGER NO.:  [id number]   MEDICAL RECORD NO.:  [id number]                   PATIENT TYPE:  OIB   LOCATION:  NA                                   FACILITY:  [facility]   PHYSICIAN:  Meade Maw, M.D.                 DATE OF BIRTH:  17-Dec-1930   DATE OF PROCEDURE:  08/06/2002  DATE OF DISCHARGE:                              CARDIAC CATHETERIZATION   INDICATIONS FOR PROCEDURE:  Ongoing chest pain in a 77 year old gentleman  with known coronary artery disease, status post CABG.  Previous Cardiolite  has not revealed any evidence of ischemia.  The patient continues to have  chest pain, therefore, risks, benefits, and options were discussed with the  patient and he wished to proceed with a left heart catheterization.   DESCRIPTION OF PROCEDURE:  After obtaining written informed consent, the  patient was brought to the cardiac catheterization laboratory in the  postabsorptive state. Preoperative sedation was achieved using IV Versed.  The right groin was prepped and draped in the usual sterile fashion. Local  anesthesia was achieved using 1% Xylocaine. A 6 French hemostasis sheath was  placed into the right femoral artery using the modified Seldinger technique.  Selective coronary angiography was performed using a JL4, JR4 Judkins  catheter. Multiple views were obtained. The saphenous vein graft to the  obtuse marginal and diagonal was engaged using the JR4.  The saphenous vein  graft to the PDA was engaged using a right coronary bypass graft. The left  internal mammary artery was not engaged but the subclavian revealed an  atretic LIMA. Left heart catheterization was performed using a right  coronary bypass graft.   FINDINGS:  The aortic pressure was 95/56, LV pressure was 95/0, the EDP was  2.  The left main coronary artery arises and distributes normal.   Left main coronary artery is normal.   The left anterior descending is somewhat  tortuous.  At its midportion there  is marked tortuosity. There is no disease noted in the left anterior  descending.  The left internal mammary artery to the left anterior  descending is atretic.   The left circumflex is totally occluded at its proximal portion.   The right coronary artery arises and distributes normal.  It is totally  occluded in its midportion.   A saphenous vein graft to the obtuse marginal is widely patent with good  distal runoff.  The saphenous vein graft to the first diagonal was widely  patent with good distal runoff.  The saphenous vein graft to the right  coronary artery is widely patent and has good distal runoff.   The left internal mammary artery arises out of the common trunk.  As noted  above, it was difficult to cannulate.  Right flow shot reveals that it is  atretic.    FINAL IMPRESSION:  1. Severe obstruction disease in  the native circumflex and right coronary     artery.  2. Patent saphenous vein graft to the first diagonal, obtuse marginal and     posterior descending artery.  3. Atretic left internal mammary artery.  There does not appear to be any     significant areas of potential ischemia.                                                   Meade Maw, M.D.    HP/MEDQ  D:  08/06/2002  T:  08/06/2002  Job:  [id number]   cc:   Armanda Magic, M.D.  [address], [address]  [place], [place] [address]  Fax: [phone]   Meredith Staggers, M.D.  [address]  [place]  [place] [address]  Fax: [phone]

## 2010-12-08 NOTE — Assessment & Plan Note (Signed)
[name] [name] returns to clinic today for followup evaluation.  He reports that  he still has his persistent chest pain.  We had switched him from Darvocet  to hydrocodone during the last clinic visit at the end of January 2007.  He  reports that he is not getting much relief with that.  We have not tried  Lyrica or lidocaine patch in the past.  We had talked with him today about a  possible lidocaine patch and he seems receptive to that.   MEDICATIONS:  1.  Aspirin 81 mg daily.  2.  Zelnorm 2 mg b.i.d.  3.  Proscar 5 mg daily.  4.  Prevacid 30 mg daily.  5.  Norvasc 10 mg daily.  6.  Hydrocodone 5/325 one tablet t.i.d. p.r.n.   PHYSICAL EXAM:  GENERAL:  Well-appearing, thin adult male in mild acute  discomfort.  VITAL SIGNS:  Blood pressure 123/60 with pulse 70, respiratory rate 16 and  O2 saturation 98% on room air.  CHEST:  He has no skin breakdown over his mid sternal wound where he has had  prior bypass surgery.  He is very tender even to light touch in that area.   IMPRESSION:  Chronic incisional pain after coronary artery bypass grafting.   In the office today, we did refill his hydrocodone at 5/325 one tablet  b.i.d. p.r.n.  He reports that he would like to use that only on an as-  needed basis.  We have also given him samples of lidocaine 5% to be used on  12 hours and off 12 hours daily.  He will call in for a new prescription for  that medication if it seems to give him help when he uses the samples.  We  will plan on seeing him at followup in approximately 4 months' time with  refill of medication prior to that appointment if necessary.           ______________________________  Ellwood Dense, M.D.     DC/MedQ  D:  12/19/2005 12:31:03  T:  12/19/2005 14:28:51  Job #:  [id number]

## 2010-12-08 NOTE — Assessment & Plan Note (Signed)
[facility]                               PULMONARY OFFICE NOTE   NAME:JONESTannon, Peerson                        MRN:          [id number]  DATE:04/10/2006                            DOB:          1930-12-05    Mr. Habeeb returns today in followup.  He is a 77 year old African-American  male with a history of chronic obstructive lung disease, asthmatic  bronchitis and reflux disease.  The patient is still not sleeping well at  night and is still having increased shortness of breath with exertion and at  rest and is noting increased head congestion as well.  The patient maintains  Guaifenex 1 b.i.d. and Advair 250/50 1 spray b.i.d.  Other maintenance  medicines listed in the chart are correct as reviewed.   PHYSICAL EXAMINATION:  VITAL SIGNS:  Temperature 97, blood pressure 124/80,  pulse 75, saturation 98% on room air and drops to 90% with exertion.  Pulse  rate is 75.  CHEST:  Distant breath sounds with prolonged expiratory phase, poor air  flow.  CARDIAC:  Exam showed a regular rate and rhythm without S3.  Normal S1 and  S2.  ABDOMEN:  Soft, nontender.  EXTREMITIES:  No edema or clubbing.  SKIN:  Clear.  NEUROLOGIC:  Intact.  HEENT:  No jugular venous distention or lymphadenopathy.   Pulmonary functions were obtained and reviewed and showed FEV1 2.69, FVC  4.02, FEV1/FVC ratio 68% of predicted, FEF 25/75 low at 45% predicted.   IMPRESSION:  Severe peripheral air flow obstruction with ongoing hypoxemia  with exertion and chronic obstructive lung disease with poor response to  Advair therapy.   PLAN:  Increase Advair to 500/50 1 spray b.i.d.  Repulse prednisone at 40 mg  daily, tapered down to 10 mg every four days until off.  We will set up  overnight sleep oximetry exam and give consideration to nocturnal oxygen  therapy.                                   Charlcie Cradle Delford Field, MD, FCCP   PEW/MedQ  DD:  04/10/2006  DT:  04/12/2006  Job #:   [id number]

## 2010-12-08 NOTE — Assessment & Plan Note (Signed)
REFERRING PHYSICIAN:  Evelene Croon, M.D.   [name] returns to the clinic today for follow-up evaluation.  He reports  that he continues to take his Neurontin 300 mg b.i.d.  He reports that he  gets reasonable relief for approximately six hours but then notices  increased pain until the next dose is due.  He reports that he is not having  any grogginess secondary to the medication and he is tolerating the  medication well.   Dr. Dayton Scrape apparently started the patient on a prednisone 12-day Dosepak for  upper extremity proximal weakness.  The patient reports that he was feeling  stronger on the medication and feels that he still may be a little bit  stronger than at his baseline.  He is off the steroids completely at this  time.   MEDICATIONS:  1. Neurontin 300 mg b.i.d.  2. Aspirin 81 mg daily.  3. Zelnorm 2 mg b.i.d.  4. Proscar 5 mg daily.  5. Prevacid 30 mg daily.  6. Norvasc 10 mg daily.   PHYSICAL EXAMINATION:  GENERAL APPEARANCE:  A well-appearing adult male.  VITAL SIGNS:  Blood pressure 123/63 with a pulse of 92, respiratory rate 16  and O2 saturation 96% on room air.  NEUROLOGIC:  Bilateral upper extremity strength was 5-/5 throughout. Bulk  and tone were normal and reflexes were 2+ and symmetrical.  Midline sternal  wound was completely healed at the present time with complaints of pain with  deep inspiration/expansion.   IMPRESSION:  Chronic incisional pain after coronary artery bypass grafting.   In the clinic today, we did decide to increase his Neurontin to 300 mg  t.i.d.  He was given a prescription in the office and also plan on seeing  him in follow-up in approximately two months' time.  Hopefully this  increased frequency should improve his overall pain level.      Ellwood Dense, M.D.   DC/MedQ  D:  10/14/2003 10:35:16  T:  10/14/2003 13:07:05  Job #:  [id number]   cc:   Evelene Croon, M.D.  [address]  [place]  [place] [address]  Fax: [phone]

## 2010-12-08 NOTE — Discharge Summary (Signed)
NAME:  SHEPPARD, LUCKENBACH NO.:  [id number]   MEDICAL RECORD NO.:  [id number]          PATIENT TYPE:  INP   LOCATION:  2041                         FACILITY:  [facility]   PHYSICIAN:  Armanda Magic, M.D.     DATE OF BIRTH:  [birth year]   DATE OF ADMISSION:  08/23/2006  DATE OF DISCHARGE:  08/27/2006                               DISCHARGE SUMMARY   DISCHARGE DIAGNOSES:  1. Chest pain, resolved.  2. Known coronary artery disease, history of coronary artery bypass      grafting.  3. Hypertension.  4. Hypercholesterolemia.  5. Peripheral vascular disease, history of left iliac stent.  6. History of renal artery stenosis, status post left renal artery      stent.  7. Status post carotid endarterectomy.  8. History of mild renal insufficiency with baseline creatinine of      1.6.   Mr. Bonner was admitted to [facility] on August 23, 2006 with  substernal chest pain.  He has known coronary artery disease and has had  bypass surgery in the past.  Cardiac isoenzymes were negative.  Of note,  his cholesterol was 212 with a triglycerides of 109, LDL of 155 and HDL  of 35.  He also had a D-dimer that was elevated at 1.01.   From a heart standpoint, he did undergo cardiac catheterization that  showed 40% left main, 50% ostial.  On diagonal the grafts were as  follows:  Patent saphenous vein graft to the OM, patent saphenous vein  graft to the first diagonal, patent saphenous vein graft to the PDA.  The LIMA to the LAD was patent, but it was atretic.   Because of the elevated D-dimer, we did go ahead and obtain a V/Q scan  (renal insufficiency plus contrast; therefore, we did not do a CT of the  chest).  The chest CT scan showed multiple perfusion defects of  segmented, which spotted blebs as noted on previous CT scan, that was  secondary to emphysema.   The patient was then felt to be ready for discharge to home.  Patient  was instructed to start multivitamins daily.   Continue his home  medications of enteric-coated aspirin 325 mg a day, meclizine p.r.n.,  Flomax daily, Paxil 30 mg a day, Zegerid 1 tablet daily,  amlodipine/benazepril 5/10 one tablet daily, inhalers as prior to  admission, simvastatin 80 mg a day, oxybutynin p.r.n., sublingual  nitroglycerin p.r.n. chest pain.  Stop metoprolol for now.   Clean the cath site daily with soap and water.  No lifting over 10  pounds for 1 week.  No driving for 2 days.  Increase activity slowly.  Remain on a low-sodium, heart healthy diet.  Follow up with Dr. Mayford Knife  on September 12, 2006 at 1:15 p.m.  Patient needs to eventually follow up  with __________ Clinic for increased LDL as mentioned in the __________.      Guy Franco, P.A.      Armanda Magic, M.D.  Electronically Signed    LB/MEDQ  D:  10/14/2006  T:  10/14/2006  Job:  [id number]   cc:   Tally Joe, M.D.

## 2010-12-08 NOTE — Discharge Summary (Signed)
NAME:  [name] [name], [name] [name] NO.:  [id number]   MEDICAL RECORD NO.:  [id number]          PATIENT TYPE:  OBV   LOCATION:  6524                         FACILITY:  [facility]   PHYSICIAN:  Corky Crafts, MDDATE OF BIRTH:  [birth year]   DATE OF ADMISSION:  10/18/2005  DATE OF DISCHARGE:  10/19/2005                                 DISCHARGE SUMMARY   DISCHARGE DIAGNOSES:  1.  Peripheral vascular disease, status post percutaneous transluminal      coronary angioplasty/stent to the left iliac artery with a Smart stent.  2.  Bilateral renal artery stenosis.  3.  Carotid artery stenosis, followed by Dr. Jerilee Field.  4.  History of mild renal insufficiency secondary to ACE inhibitors with      baseline creatinine at 1.6.  5.  Dyslipidemia, treated.  6.  Hypertension, treated.  7.  Systolic murmur secondary to mild to moderate aortic valve sclerosis,      mild aortic insufficiency (SBE prophylaxis).  8.  Benign prostatic hypertrophy.  9.  Coronary artery disease, history of coronary artery bypass graft.   HOSPITAL COURSE:  [name] is a 77 year old male patient with known  coronary artery disease as well as peripheral vascular disease. He was  admitted for an abdominal aortogram and pelvic angiogram secondary to lower  extremity claudication.  He has had previous peripheral vascular procedures  in the past as mentioned above. In addition, he has had placed a right  common iliac stent as well as a left common iliac stent. He was admitted on  October 18, 2005, for the abdominal aortogram and pelvic angiogram. The  procedure yielded that both right and left common iliac stents were patent,  however he was noted on this procedure to have distal left common iliac  stenosis with a 30-mm marker gradient. He underwent a PTA/stent placement  with a Smart stent successfully. The patient tolerated the procedure well  and has been instructed to remain on Plavix as well as full strength  aspirin  indefinitely. Upon discharge her creatinine was 1.5.   The patient remained in the hospital overnight and was stable. During the  procedure he was also noted to have bilateral renal artery stenosis, 95%  left, 70% right ostial. Our plan is to have the patient return to the office  next week and perform PTA/stent to the left renal within one to two weeks  and then selectively to the right renal artery.   The patient's discharge medications included new medications: Enteric-coated  aspirin 325 mg daily, Plavix 75 mg daily.   He is to resume all his other medications including his antibiotics for  prophylaxis, multivitamin daily, meclizine as needed, Darvocet p.r.n.,  Norvasc 2 mg daily, Zetia 10 mg daily, Lipitor 40 mg daily, clonidine 0.1 mg  b.i.d., Paxil 10 mg daily, Toprol XL 25 mg daily, Nexium 40 mg daily,  Guaifenex 60 mg b.i.d., Flomax 0.4 mg daily.   No driving for two days, no lifting over 10 pounds for one week, increase  activity slowly, clean area gently with soap and water. Follow up  with Dr.  Eldridge Dace on October 25, 2005, at 1:45 p.m. and at that visit we can set up his  renal artery procedure. He is to call if he has any groin pain or swelling.   Addendum:  I saw and examined this patient and performed the assessment and  plan on the day of discharge.      Guy Franco, P.A.      Corky Crafts, MD  Electronically Signed    LB/MEDQ  D:  10/19/2005  T:  10/20/2005  Job:  [id number]   cc:   Tally Joe, M.D.  Fax: [phone]

## 2010-12-08 NOTE — Op Note (Signed)
NAME:  [name] [name], [name] [name] NO.:  [id number]   MEDICAL RECORD NO.:  [id number]          PATIENT TYPE:  OBV   LOCATION:  6524                         FACILITY:  [facility]   PHYSICIAN:  Corky Crafts, MDDATE OF BIRTH:  1930-10-07   DATE OF PROCEDURE:  10/18/2005  DATE OF DISCHARGE:  10/19/2005                                 OPERATIVE REPORT   REFERRING PHYSICIAN:  Dr. Armanda Magic.   PROCEDURES PERFORMED:  Abdominal aortogram, pelvic angiogram, bilateral  lower extremity runoffs, PTCA/stent left common iliac artery.   SURGEON:  Corky Crafts, MD.   INDICATION:  Claudication.   PROCEDURAL NARRATIVE:  The risks and benefits of peripheral artery  angiography and intervention were explained to the patient and informed  consent was obtained.  The patient was brought to the [facility] lab and placed on  the table.  He was prepped and draped in the usual sterile fashion.  We used  1% lidocaine  to infiltrate his right groin.  A 5-French arterial sheath was  placed in his right femoral artery using modified Seldinger technique.  A  pigtail catheter was advanced through his abdominal aorta and a power  injection was performed to visualize his abdominal aorta at the level of  renal arteries.  The pigtail catheter was then withdrawn to the aortoiliac  bifurcation and a second power injection was then performed.  Bilateral  lower extremity runoffs were then performed and an attempt was made to cross  over into the left iliac system via the right femoral sheath.  A Wholey wire  could not be successfully advanced due the patient's fairly narrow  aortoiliac bifurcation and prior iliac stents.  Left femoral arterial access  was then obtained using the modified Seldinger technique after the skin had  been infiltrated with 1% lidocaine.  The 7-French 35 cm sheath was then  advanced to the external iliac.  A 5 mm x 6 cm Powerflex balloon was then  advanced across the lesion and  inflated to 12 atmospheres for 30 seconds.  An 8 mm x 48 mm Smart stent was then advanced across the lesion and  subsequently deployed.  There were several areas under deployment.  These  areas were then post dilated with a 7 mm x 20 mm Powerflex balloon.  The  first inflation was to 12 atmospheres for 10 seconds, then the balloon was  withdrawn slightly and inflated to 80 atmospheres for 20 seconds and then  again to 8 atmospheres for 10 seconds.  There was an excellent angiographic  result.  There was normal flow throughout the iliac system.   FINDINGS:  Abdominal aortogram showed mild diffuse atherosclerosis in the  infrarenal aorta.  There was no evidence of aneurysm.  Renal arteries showed  a 95% left renal artery stenosis.  The left kidney measures 9.1 cm.  The  right renal artery showed a 60 to 70% ostial stenosis, that particular  kidney measures 10.5 cm in length.  The pelvic angiogram showed a patent  right common iliac stent, there was diffuse atherosclerosis throughout the  right external  iliac, the right internal iliac is occluded.  Thee left  common iliac stent was also patent.  There is a 60% lesion starting within  the distal portion of the stent and extending into DeNovo iliac vessel.  Across this lesion, there is at 30 mm gradient.  The left internal iliac  artery was occluded.  Within the left common femoral artery, there is an  ectatic segment with moderate atherosclerosis.  There was mild to moderate  atherosclerosis bilaterally in the superficial femoral arteries.  There were  no hemodynamically significant lesions.  Below the knee, the right anterior  tibial artery was subtotally occluded.  The right posterior tibial had mild  irregularities.  The right peroneal vessel had a 40% mid lesion.  The left  anterior tibial artery was occluded and collateralized by the peroneal  artery.  The left posterior tibial and left peroneal artery had mild  irregularities.    IMPRESSION:  1.  Distal left common iliac stenosis with a 30 mm gradient across this      lesion, successful percutaneous transluminal angioplasty/stent placement      with a 8 mm x 4 cm Smart stent.  2.  Bilateral renal artery atherosclerosis.   RECOMMENDATIONS:  1.  The patient should continue aspirin 325 mg p.o. daily indefinitely and      Plavix 75 mg p.o. daily  for at least 1 month.  2.  We will plan renal artery PTA and stent likely within the next 1-2      weeks.  I would plan on intervening upon the left renal artery first and      performing a selective angiogram on the right renal artery.  3.  The patient will be observed overnight within the hospital.  If there      are no complications, he will be discharged tomorrow.      Corky Crafts, MD  Electronically Signed     JSV/MEDQ  D:  10/18/2005  T:  10/19/2005  Job:  [id number]

## 2010-12-08 NOTE — Assessment & Plan Note (Signed)
[name] returns to clinic today for follow-up evaluation today.  He  reports that he is not feeling as much relief with his Darvocet for his  sternal chest pain as he had been previously.  He reports that he takes a  tablet and generally gets relief for three to four hours afterwards.  He is  reluctant to consider taking more medication as he has not used it in the  past.  He is willing to use medication if he has a sufficient supply.  He is  interested in retiring from work but continues to work for [employer]  in [employer].  He generally works from three to five days  per week.   MEDICATIONS:  1. Aspirin 81 mg daily.  2. Zelnorm 2 mg b.i.d.  3. Proscar 5 mg daily.  4. Prevacid 30 mg daily.  5. Norvasc 10 mg daily.  6. Darvocet-N 100 one tablet t.i.d. p.r.n.   PHYSICAL EXAMINATION:  GENERAL:  A well-appearing, fit adult male in no  acute discomfort.  VITAL SIGNS:  Blood pressure 149/70 with a pulse 70, respiratory rate 16,  and O2 saturation 98% on room air.  MUSCULOSKELETAL/NEUROLOGIC:  He has 5/5 strength throughout the bilateral  upper and lower extremities.  He is reluctant to let me touch his chest  where he has a midsternal wound with no skin breakdown and excellent  healing.   IMPRESSION:  Chronic incisional pain after coronary artery bypass graft.   In the office today, we did refill his Darvocet and allowed him to use one  tablet p.o. q.i.d. p.r.n., a total of 120.  Will plan on refilling that over  the next month or so as necessary.  Will plan on seeing the patient in  follow-up in approximately six months' time.       DC/MedQ  D:  02/26/2005 13:06:02  T:  02/27/2005 07:38:03  Job #:  [id number]

## 2010-12-08 NOTE — Assessment & Plan Note (Signed)
[name] [name] returns to the clinic today for followup evaluation.  He reports  that he has retired from the [employer] as of September 2006.  He  reports that he is having persistent chest pain.  He is not getting much  relief from the Darvocet at this time, despite using it three times a day.  He would like to try a slightly stronger medicine.   MEDICATIONS:  1.  Aspirin 81 mg every day.  2.  Zelnorm 2 mg b.i.d.  3.  Proscar 5 mg every day.  4.  Prevacid 30 mg every day.  5.  Norvasc 10 mg every day.  6.  Darvocet-N 100 one tablet t.i.d. p.r.n.   PHYSICAL EXAMINATION:  GENERAL:  A well-appearing, fit, adult male in mild  to no acute discomfort.  VITAL SIGNS:  Blood pressure 123/60 with a pulse of 80, respiratory rate 16,  and O2 saturation 96% on room air.  MUSCULOSKELETAL:  He has 5/5 strength throughout the bilateral upper and  lower extremities.  He had a well healed mid sternal wound with no skin  breakdown noted.   IMPRESSION:  Chronic incisional pain after coronary artery bypass grafting.   1.  In the office today, we did give the patient a new prescription for      hydrocodone 5/325, one tablet p.o. t.i.d. p.r.n., a total of 90 with no      refills.  2.  We will plan on seeing him in followup in approximately four months'      time to see how he is doing.  3.  He will call in for refills as necessary.           ______________________________  Ellwood Dense, M.D.     DC/MedQ  D:  08/22/2005 15:59:42  T:  08/22/2005 22:50:12  Job #:  [id number]

## 2010-12-08 NOTE — Assessment & Plan Note (Signed)
REFERRING PHYSICIAN:  Evelene Croon, M.D.   [name] returns to clinic today for follow-up evaluation.  Unfortunately,  we had to reschedule the patient from a few days ago to today's appointment.  Due to that change, the patient ran out of his Neurontin medication over the  past four to five days.  He has noticed that off that medication, he has  substantially increased pain which he describes as a heaviness in his chest.  He reports that that was not present when he was taking his Neurontin 300 mg  three times a day.   The patient continues to work at [employer].  He plans to retire  completely as of October 2005 and move to an area that he would live on and  have a couple of horses.  That will be with his wife and they will entertain  grandchildren there on a periodic basis.   MEDICATIONS:  1. Neurontin 300 mg t.i.d.  2. Aspirin 81 mg daily.  3. Zelnorm 2 mg b.i.d.  4. Proscar 5 mg daily.  5. Prevacid 30 mg daily.  6. Norvasc 10 mg daily.   PHYSICAL EXAMINATION:  GENERAL APPEARANCE:  A well-appearing fit adult male.  VITAL SIGNS:  Blood pressure 138/63 with pulse of 70, respiratory rate 14,  and O2 saturation 97% on room air.  NEUROLOGIC:  The patient has 5-/5 strength throughout. Bulk and tone were  normal.  Reflexes were 2+ and symmetrical.  Midline sternal wound was  completely healed.   IMPRESSION:  Chronic incisional pain after coronary artery bypass grafting.   In the clinic today, I did refill the patient's Neurontin 300 mg one p.o.  t.i.d. a total of 90 with three refills.  We will plan on seeing him in  follow-up in three months' time.  Apparently the Neurontin is giving him a  fair amount of benefit as he noticed that his pain was substantially worse  off that medication.  Will try to refill his medications as necessary  without delay.      Ellwood Dense, M.D.   DC/MedQ  D:  12/16/2003 11:34:44  T:  12/16/2003 12:19:44  Job #:  [id number]   cc:   Evelene Croon, M.D.  [address]  [place]  [place] [address]  Fax: [phone]

## 2010-12-08 NOTE — H&P (Signed)
NAME:  [name] [name], [name] [name] NO.:  [id number]   MEDICAL RECORD NO.:  [id number]          PATIENT TYPE:  INP   LOCATION:  2041                         FACILITY:  [facility]   PHYSICIAN:  Armanda Magic, M.D.     DATE OF BIRTH:  June 28, 1931   DATE OF ADMISSION:  08/23/2006  DATE OF DISCHARGE:                              HISTORY & PHYSICAL   PRIMARY CARE PHYSICIANS:  1. Deatra Coy, M.D.  2. Holley Bouche, M.D.  3. Tally Joe, M.D.   CARDIOLOGIST:  Armanda Magic, M.D.   REASON FOR ADMISSION:  Chest pain.   HISTORY OF PRESENT ILLNESS:  Mr. Embleton is a 77 year old African American  male with a known history of coronary artery disease, status post CABG.  He also has a history of peripheral artery disease, hypertension, and  dyslipidemia.  Since his CABG in 2004-2005, he has complained of chest  pain that was believed to be secondary to cutting of the nerve endings  during the surgery.  He presented to the [facility] Cardiology office on  today complaining of chest pain that occurs two to three times a day  with a duration of 10 minutes.  He admits to nausea and shortness of  breath, however, denies vomiting, dizziness or diaphoresis.  The pain  radiates up to his neck as well as up his left arm.  Although he has a  history of chest pain in the past, the characteristics and quality of  this chest pain are more severe and dissimilar to chest pain episodes in  the past.  A 12-lead EKG was performed in the office revealing a normal  sinus rhythm with a ventricular rate of 68 beats per minute.  There was  no evidence of acute ST-T wave changes.  He is being directly admitted  to the [facility] for a cardiac catheterization with possible  PCI on Monday to August 26, 2006, to be performed by Dr. Armanda Magic.  The patient is currently chest pain free during this office visit.   PAST MEDICAL HISTORY:  1. Coronary artery disease status post CABG.  2. Hypertension.  3.  Dyslipidemia.  4. History of mild renal insufficiency secondary to ACE inhibitors      with a baseline creatinine of 1.6.  5. Carotid artery stenosis, status post carotid endarterectomy.  6. Bilateral renal artery stenosis, status post left renal artery      angioplasty and stenting.  7. Peripheral vascular disease, status post PTCA with stent      implantation to the left iliac artery with a Smart stent.  8. History of chronically occluded right iliac artery.  9. Systolic murmur secondary to mild to moderate aortic valve      sclerosis, mild aortic insufficiency on SBE prophylaxis.  10.Benign prostatic hypertrophy.  11.Bilateral hand numbness and left arm weakness on steroids.  12.History of shortness of breath.  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   ALLERGIES:  NO KNOWN DRUG ALLERGIES.   CURRENT MEDICATIONS:  1. Aspirin 325 mg daily.  2. Multivitamin 1 tablet daily.  3. Meclizine 25 mg as needed.  4. Flomax 0.4 mg daily.  5. Paroxetine 30 mg daily.  6. Zegerid 40 mg daily.  7. Amlodipine/benazepril 5/10 mg daily.  8. Asthmanex inhaler daily.  9. Foradil inhaler.  10.Simvastatin 80 mg daily.  11.Oxybutynin 5 mg daily.  12.Metoprolol 50 mg, one half tablet daily.   PAST SURGICAL HISTORY:  1. Status post CABG.  2. Status post carotid endarterectomy.   FAMILY HISTORY:  Insignificant for early coronary artery disease.   SOCIAL HISTORY:  Former smoker with cessation 14 years ago.  He  currently denies tobacco or illicit drug use.  He occasionally drinks  alcohol.   REVIEW OF SYSTEMS:  All other systems reviewed are negative other than  what is stated in the HPI.   PHYSICAL EXAMINATION:  GENERAL:  A 77 year old male, pleasant and  cooperative, NAD.  VITAL SIGNS:  Blood pressure 134/84, pulse 76 and regular, weight 177.2  pounds, height 5 feet 9 inches.  HEENT: Unremarkable.  NECK:  Supple without JVD or carotid  bruits bilaterally.  Carotid  upstrokes 2+.  PULMONARY:  Breath sounds are equal and clear to auscultation  bilaterally.  CV:  Regular rate and rhythm with a normal S1-S2 with an early systolic  murmur.  ABDOMEN:  Soft, nontender, nondistended with a right iliac bruit.  EXTREMITIES:  No peripheral edema.  DP pulses +1/2, bilaterally.  Femoral pulses 3+ on the right, and 2+ on the left.  NEURO:  No focal motor or sensory deficits.  SKIN:  Warm and dry with a well-healed scar on the chest from prior  CABG.  BACK:  No kyphosis or scoliosis.  PSYCH:  Normal mood and affect.   LABORATORY DATA:  A 12-lead EKG as stated in the HPI.   ASSESSMENT:  1. Acute coronary syndrome consistent with unstable angina.  2. Coronary artery disease, status post coronary artery bypass graft.  3. Hypertension, controlled.  4. Dyslipidemia.  5. 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  6. Left renal artery stenosis, status post angioplasty and stenting.  7. Left iliac stenosis, status post angioplasty and stenting.  8. Systolic murmur with mild to moderate aortic valve sclerosis and      aortic insufficiency, using subacute bacterial endocarditis      prophylaxis.  9. Carotid artery stenosis, status post carotid endarterectomy.  10.Mild renal insufficiency  11.History of peripheral vascular disease with claudication.  12.Otherwise as stated in the past medical history.   PLAN:  1. Directly admit to a cardiac telemetry unit under the service of Dr.      Armanda Magic with a diagnosis of unstable angina.  2. Check CBC, B-MET, PT, PTT, INR, magnesium, and portable chest x-      ray.  3. Rule out MI with cardiac panel including troponin-I q.8 h x3 with      the first set to be drawn now.  4. EKG now and then daily x1.  5. Daily CBC and B-MET.  6. Fasting lipid panel sorry in the a.m. 7. Start subcu Lovenox every 12 hours per pharmacy protocol.  8. IV  nitroglycerin at 10 mcg/min, titrating by 5-10 mcg/min for chest      pain relief, keeping a systolic blood pressure greater than 100.  9. Morphine 2-4 mg IV every 1-2 hours as needed for chest pain.  10.Initiate cardiology p.r.n. orders.  11.Continue enteric coated aspirin  325 mg daily, simvastatin 80 mg      daily, Lotrel 5-10 mg daily.  12.Flomax 0.4 mg daily, Paxil 30 mg daily, Protonix 40 mg daily,      Asthmanex inhaler daily, and Toprol XL 25 mg daily.  Hold Toprol      for a systolic blood pressure less than 100 or heart rate less than      60  13.Cardiac catheterization to be performed on August 26, 2006, in      a.m. by Dr. Armanda Magic.  The cardiac catheterization procedure,      risks, and potential complications have been explained to the      patient in detail including MI, CVA, death, renal insufficiency, IV      contrast dye allergy, and vascular complications including hematoma      and pseudoaneurysm.  The patient admits to full understanding and      wishes to proceed.  14.The patient was seen, interviewed, and examined by Dr. Armanda Magic      who precipitated in the medical decision-making and plan-of-care  15.Further measures by Dr. Grayling Congress, PA      Armanda Magic, M.D.  Electronically Signed    RDM/MEDQ  D:  08/23/2006  T:  08/23/2006  Job:  [id number]   cc:   Deatra Alam, M.D.  Holley Bouche, M.D.  Tally Joe, M.D.

## 2010-12-08 NOTE — Assessment & Plan Note (Signed)
[facility]                               PULMONARY OFFICE NOTE   NAME:Farnworth, JABBAR PALMERO                        MRN:          [id number]  DATE:02/26/2006                            DOB:          October 02, 1930    Mr. Schoenfelder returns today in follow-up and is a 77 year old African American  male with history of asthmatic bronchitis, chronic obstructive lung disease  noting continued mucus in the throat.  Reflux symptoms are well controlled  now on Prilosec over-the-counter, 20 mg daily.  Patient maintains Advair but  is only taking this one spray daily 250/50 strength due to a  miscommunication.  He is now off the Spiriva, is off Nexium, maintains  Guaifenesin 600 mg b.i.d.  Patient is currently off Lipitor, clonidine and  Paxil.  Other maintenance medicines are correct as reviewed and listed on  the chart.   PHYSICAL EXAMINATION:  VITAL SIGNS:  Temperature 97, blood pressure 110/68,  pulse 76, saturation 95% on room air.  CHEST:  Distant breath sounds with prolonged expiratory phase, no wheeze or  rhonchi noted.  CARDIOVASCULAR:  Regular rate and rhythm without S3.  Normal S1 and S2.  ABDOMEN:  Soft, nontender.  EXTREMITIES:  No edema or clubbing.  SKIN:  Clear.   LABORATORY DATA:  Pulmonary functions were totally uninterpretable.   IMPRESSION:  Asthmatic bronchitic flare with poor compliance to medications.   PLAN:  Increase Advair to b.i.d. dosing and be consistent with its use, one  spray b.i.d.  Patient received pulse prednisone at 40 mg a day, tapered down  by 10 mg every four days until off.  We will see the patient back in follow-  up in a month.                                   Charlcie Cradle Delford Field, MD, [facility]   PEW/MedQ  DD:  02/27/2006  DT:  02/27/2006  Job #:  [id number]

## 2010-12-08 NOTE — Assessment & Plan Note (Signed)
[facility]                             PULMONARY OFFICE NOTE   NAME:Dittmer, JERRYL HOLZHAUER                        MRN:          [id number]  DATE:09/16/2006                            DOB:          1930/10/16    Mr. Micucci is a 77 year old African American male with history of chronic  obstructive lung disease, asthmatic bronchitis, hypertension, reflux  disease.  He has had previous bypass surgery in 1997.  Recent cardiac  catheterization, however, failed to show any recurrence in coronary  disease or worsening of coronary disease despite having some jaw and arm  pain.  He states his congestion has improved, particularly when on the  Zegerid at 40 mg daily.  He maintains Advair 500/50 one spray b.i.d.  He  is on p.r.n. albuterol, guaifenesin at 600 mg b.i.d.   PHYSICAL EXAMINATION:  VITAL SIGNS:  Temperature 98, blood pressure  134/72, pulse 77, saturation 98% on room air.  CHEST:  Diminished breath sounds with no evidence of wheeze or rhonchi.  CARDIAC:  Regular rate and rhythm without S3.  Normal S1 and S2.  ABDOMEN:  Soft, nontender.  EXTREMITIES:  No edema or clubbing.   IMPRESSION:  Chronic obstructive lung disease with asthmatic bronchitic  components.   PLAN:  The patient is to maintain Advair at 500/50 one spray b.i.d. and  utilize Zegerid at 40 mg daily in place of Prilosec over-the-counter for  reflux disease.  We will see the patient back in followup in two months.     Charlcie Cradle Delford Field, MD, [facility]  Electronically Signed    PEW/MedQ  DD: 09/16/2006  DT: 09/16/2006  Job #: [id number]   cc:   Armanda Magic, M.D.

## 2010-12-08 NOTE — Assessment & Plan Note (Signed)
MEDICAL RECORD NUMBER:  [id number].   Mr. Harkin returns to the clinic today for followup evaluation.  Since the  last clinic visit on Dec 16, 2003, he has been switched from his Vicodin and  Neurontin to Darvocet-N 100.  He is using one to two tablets p.o. daily on  an as needed basis.  He reports that he is gaining reasonable relief and  approximately 10-20% better in terms of the incisional pain of his chest.  He continues to work at __________ [employer], but is planning to retire over  the next few months.   MEDICATIONS:  1. Aspirin 81 mg daily.  2. Zelnorm 2 mg b.i.d.  3. Proscar 5 mg daily.  4. Prevacid 30 mg daily.  5. Norvasc 10 mg daily.  6. Darvocet-N 100 one to two tablets p.o. daily p.r.n.   PHYSICAL EXAMINATION:  A well-appearing, fit, adult male with blood pressure  146/79 with pulse of 80, respiratory rate 20 and O2 saturation 98% on room  air.  He has 5- to 5/5 strength throughout the bilateral upper and lower  extremities.  The mid sternal wound was completely healed with complaints of  pain with palpation.   IMPRESSION:  Chronic incisional pain after coronary artery bypass grafting.   In the clinic today, I did refill his Darvocet-N 100 one to two tablets p.o.  daily p.r.n., a total of 120 with two refills.  Will plan on seeing the  patient in followup in approximately five months' time.      Ellwood Dense, M.D.   DC/MedQ  D:  03/15/2004 14:32:07  T:  03/15/2004 16:35:36  Job #:  [id number]

## 2010-12-08 NOTE — H&P (Signed)
NAME:  JULIE, PAOLINI NO.:  [id number]   MEDICAL RECORD NO.:  [id number]          PATIENT TYPE:  INP   LOCATION:  2041                         FACILITY:  [facility]   PHYSICIAN:  Tylene Fantasia, PA    DATE OF BIRTH:  1930-09-23   DATE OF ADMISSION:  08/23/2006  DATE OF DISCHARGE:                              HISTORY & PHYSICAL   ADDENDUM:  To job [phone]  Correction to HPI, Past Medical History and Assessment.   I indicated that this patient had a systolic murmur consistent with  aortic stenosis. It should be changed to aortic sclerosis; no stenosis.      Tylene Fantasia, [place]     RDM/MEDQ  D:  08/23/2006  T:  08/24/2006  Job:  [id number]

## 2010-12-08 NOTE — Assessment & Plan Note (Signed)
[name] [name] returns to clinic today for follow up evaluation.  He reports that  he is getting good relief from the hydrocodone 5/325 used up to three times  per day.  He generally uses anywhere from 1 to 3 tablets per day.  The  patient continues to be retired.  He is somewhat bored and looking for  possible part time work.  His wife continues to work part time as a Engineer, civil (consulting).   MEDICATIONS:  1. Aspirin 81 mg daily.  2. Zelnorm 2 mg b.i.d.  3. Proscar 5 mg daily.  4. Prevacid 30 mg daily.  5. Norvasc 10 mg daily.  6. Hydrocodone 5/325, one tablet t.i.d. p.r.n. (1 to 3 per day).   REVIEW OF SYSTEMS:  Noncontributory.   PHYSICAL EXAMINATION:  Well appearing fit adult male in no acute discomfort.  Blood pressure 132/58 with a pulse of 85, respiratory rate 16, and O2  saturation 96% on room air.  He has 5/5 strength throughout the bilateral  upper and lower extremities.  He has significant sensitivity of his chest  over the mid sternal wound where he has had prior bypass surgery.   IMPRESSION:  Chronic incisional pain after coronary artery bypass grafting.  In the office today, we did refill the patient's hydrocodone.  He has been  using Milk of Magnesia for some constipation and I have recommended Senokot-  S to be used over the counter.  He will plan on getting that at the pharmacy  and using that in place of the Milk of Magnesia.  Otherwise, he gets good  relief from his hydrocodone.  We will plan on seeing the patient in follow  up in this office in approximately five months time with refills prior to  that appointment if necessary.           ______________________________  Ellwood Dense, M.D.     DC/MedQ  D:  05/01/2006 12:25:03  T:  05/02/2006 23:31:25  Job #:  [id number]

## 2010-12-08 NOTE — H&P (Signed)
NAME:  [name] [name], [name] [name] NO.:  [id number]   MEDICAL RECORD NO.:  [id number]                   PATIENT TYPE:  INP   LOCATION:  NA                                   FACILITY:  [facility]   PHYSICIAN:  Quita Skye. Hart Rochester, M.D.               DATE OF BIRTH:  [birth year]   DATE OF ADMISSION:  07/03/2002  DATE OF DISCHARGE:                                HISTORY & PHYSICAL   ADMISSION DIAGNOSIS:  Left carotid stenosis.   HISTORY OF PRESENT ILLNESS:  The patient is a 77 year old male who is  referred to Dr. Hart Rochester by Dr. Armanda Magic, his cardiologist.  He says he  was having a routine physical exam performed by his cardiologist at which  time she heard a left carotid bruit.  He subsequently had a carotid  ultrasound performed which revealed that he had significant left internal  carotid artery stenosis.  He denies any headaches, nausea, vomiting,  vertigo, dizziness, history of falls, seizures, numbness or tingling.  He  denies any muscle weakness, speech impairment, dysphagia, visual changes,  confusion, memory loss, syncope, or presyncope.   PAST MEDICAL HISTORY:  1. Carotid artery disease.  2. Coronary artery disease.  3. Peptic ulcer disease.   PAST SURGICAL HISTORY:  Coronary artery bypass grafting in 1997 by Dr. Evelene Croon.   ALLERGIES:  No known medication allergies.   MEDICATIONS:  1. Prevacid 30 mg p.o. q.d.  2. Aspirin 81 mg  p.o. q.d.  3. Lopressor 25 mg p.o. q.d.   FAMILY HISTORY:  The patient says that there is no family history of  myocardial infarction, cancer, cerebrovascular accident, hypertension, or  diabetes mellitus.   SOCIAL HISTORY:  The patient is married and lives with his wife, has six  kids.  He drinks approximately two to three alcoholic beverages per week.  He quit smoking 12 years ago but did smoke approximately one pack of  cigarettes a day for 45 years before quitting.   REVIEW OF SYSTEMS:  GENERAL:  The patient denies  any fever, chills, night  sweats, or frequent illnesses.  HEAD:  The patient denies any head injury or  loss of consciousness.  EYES:  The patient denies any glaucoma or cataracts.  EARS:  The patient denies any tinnitus, vertigo, hearing loss, or ear  infections.  NOSE:  The patient denies any epistaxis, rhinitis, or  sinusitis.  MOUTH:  The patient denies any problems with dentition or  frequent sore throats.  NECK:  The patient denies any lumps, masses, or pain  with range of motion in his neck.  CARDIOVASCULAR:  The patient has coronary  artery disease which was treated surgically in 1997.  PULMONARY:  The  patient denies any asthma, bronchitis, emphysema, or pneumonia.  GI:  The  patient has a history of peptic ulcer disease which he takes Prevacid for.  Denies any nausea,  vomiting, diarrhea, or constipation.  ENDOCRINE:  The  patient denies any diabetes mellitus or thyroid disease.  GU:  The patient  denies any urinary tract infections or incontinence.  MUSCULOSKELETAL:  The  patient denies any arthritis, arthralgias, or myalgias.  NEUROLOGIC: The  patient denies any stroke or transient ischemic attack, memory loss, or  depression.   PHYSICAL EXAMINATION:  VITAL SIGNS:  Blood pressure 140/90, pulse 72 and  regular, respirations 16.  GENERAL:  The patient is alert and oriented x3, is in no distress.  HEENT:  Head is atraumatic, normocephalic.  Eyes:  Pupils are equally round  and reactive to light and accommodation.  Extraocular motions are intact.  No scleral icterus or nystagmus.  Ears:  Auditory acuity is grossly intact.  Nose:  Nasal patency is intact.  Sinuses are nontender.  Mouth is moist  without exudates.  NECK:  Supple without JVD, lymphadenopathy, or thyromegaly.  The patient had  bilateral carotid bruits, the left greater than the right.  CARDIOVASCULAR:  Revealed regular rate and rhythm without murmurs, gallops,  or rubs.  LUNGS:  Clear to auscultation bilaterally  without rales, rhonchi, or  wheezing.  He had a well healed median sternotomy incision.  ABDOMEN:  Soft, nontender, nondistended, positive bowel sounds in all four  quadrants.  EXTREMITIES:  Revealed no cyanosis, clubbing, or edema.  NEUROLOGIC:  Cranial nerves II-XII are grossly intact.  He has steady gait.  He has 5+ and equal muscle strength in all four extremities.   IMPRESSION:  Left carotid stenosis.   PLAN:  Left carotid endarterectomy by Dr. Hart Rochester.     Levin Erp. Steward, P.A.                      Quita Skye Hart Rochester, M.D.    Henrene Hawking  D:  07/01/2002  T:  07/01/2002  Job:  [id number]

## 2010-12-08 NOTE — Procedures (Signed)
[facility]  Patient:    [name] [name], [name] [name] Visit Number: [id number] MRN: [id number]          Service Type: END Location: ENDO Attending Physician:  Orland Mustard Dictated by:   Llana Aliment. Randa Evens, M.D. Proc. Date: 08/06/01 Admit Date:  08/06/2001   CC:         Meredith Staggers, M.D.   Procedure Report  PROCEDURE:  Esophagogastroduodenoscopy with biopsy.  MEDICATIONS:  Hurricane spray, fentanyl 50 mcg, Versed 4 mg IV.  INDICATIONS FOR PROCEDURE:  Persistent abdominal bloating, epigastric pain in a patient with a previous history of ulcers and has failed all sorts of conservative therapy. For this reason, an endoscopy is repeated.  DESCRIPTION OF PROCEDURE:  The procedure had been explained to the patient and consent obtained. With the patient in the left lateral decubitus position, the Olympus videoscope was inserted blindly and advanced into the esophagus. The stomach was entered, pylorus identified and passed. The patient had duodenitis with several erosions, the pyloric channel was free of ulceration. The gastric antrum was inflamed with several erosions. A biopsy was taken for rapid urease test for Helicobacter. No other abnormalities were seen in the gastric antrum. No ulcers were seen. The patient did have linear erosions in the esophagus as well. The distal and proximal esophagus were normal. The scope was withdrawn. The patient tolerated the procedure well. He was maintained on low flow oxygen and pulse oximeter throughout the procedure.  ASSESSMENT:  1. Gastritis and duodenitis.  2. Esophagitis.  PLAN:  Test results of the test for Helicobacter, treat if present. Will see back in the office in six weeks. Dictated by:   Llana Aliment. Randa Evens, M.D. Attending Physician:  Orland Mustard DD:  08/06/01 TD:  08/06/01 Job: [id number]

## 2010-12-08 NOTE — Assessment & Plan Note (Signed)
[facility]                               PULMONARY OFFICE NOTE   NAME:[name] [name], Frieden                        MRN:          [id number]  DATE:01/29/2006                            DOB:          Dec 19, 1930    [name] [name] returns today in follow-up.  He is a 77 year old African-American  male we first saw in March 2007 for evaluation of chronic obstructive lung  disease and asthmatic bronchitis.  He was placed on Spiriva alone.  Pulmonary function showed moderate obstructive defect and reduction in  diffusion capacity.  Chest x-ray showed COPD changes.  He is still having  significant mucus in his throat, particularly if he lies flat at night.  Having increased heartburn despite being on Nexium at 40 mg daily.  Notes  continued dyspnea and excess mucus production.   PHYSICAL EXAMINATION:  VITAL SIGNS:  Temperature 98, blood pressure 120/78,  pulse 71, saturation 97% in room air.  GENERAL:  This is an elderly African-American male in no distress.  CHEST:  Distant breath sounds with prolonged expiratory phase, no wheeze or  rhonchi noted.  CARDIAC:  Regular rate and rhythm without S3, normal S1, S2.  ABDOMEN:  Soft, nontender.  EXTREMITIES:  No edema or clubbing.  SKIN:  Clear.  NEUROLOGIC:  Intact.  HEENT:  No jugular venous distention or lymphadenopathy.  Oropharynx clear.  Neck supple.   IMPRESSION:  Chronic obstructive lung disease, asthmatic bronchitic  component with flare, exacerbated by gastroesophageal reflux disease.   PLAN:  1.  Discontinue Nexium and begin Zegerid 40 mg daily.  2.  Begin Nasacort two sprays each nostril daily.  3.  Switch Spiriva to Advair 250/50 one spray b.i.d.  4.  Obtain a CT scan of the chest to follow up question of lung nodule.  5.  See the patient back in six weeks.                                   Charlcie Cradle Delford Field, MD, [facility]   PEW/MedQ  DD:  01/29/2006  DT:  01/30/2006  Job #:  [id number]   cc:   Armanda Magic, MD  Tally Joe, MD

## 2010-12-08 NOTE — Op Note (Signed)
NAME:  [name] [name], [name] [name] NO.:  [id number]   MEDICAL RECORD NO.:  [id number]                   PATIENT TYPE:   LOCATION:                                       FACILITY:  [facility]   PHYSICIAN:  Edmundo Tedesco. Hart Rochester, M.D.               DATE OF BIRTH:  02-03-1931   DATE OF PROCEDURE:  DATE OF DISCHARGE:                                 OPERATIVE REPORT   /PREOPERATIVE DIAGNOSIS/>  Severe left internal carotid stenosis, asymptomatic.   POSTOPERATIVE DIAGNOSIS:  Severe left internal carotid stenosis,  asymptomatic.   OPERATION:  Left carotid endarterectomy with  Dacron patch angioplasty.   SURGEON:  Anish Vana. Hart Rochester, M.D.   FIRST ASSISTANT:  Larina Earthly, M.D.   SECOND ASSISTANT:  Toribio Harbour, R.N.   ANESTHESIA:  General endotracheal.   BRIEF HISTORY:  This patient was found to have a severe left internal  carotid stenosis by duplex scanning with mild disease on the contralateral  right side.  He is asymptomatic and scheduled for a left carotid  endarterectomy.   DESCRIPTION OF PROCEDURE:  The patient was taken to the operating room,  placed in the supine position at which time satisfactory general  endotracheal anesthesia was administered.  The left neck was prepped with  Betadine scrub and solution, draped in routine sterile manner.  Incision was  made along the anterior border of the sternocleidomastoid muscle and carried  down through subcutaneous tissue and platysma using Bovie.  The common  facial vein and external jugular vein was ligated with 3-0 silk ties and  divided exposing the common internal and external carotid arteries.  Care  was taken not to injure the vagus or hypoglossal nerves, both of which were  exposed.  There was calcified atherosclerotic plaque at carotid bifurcation  extending about 2 to 3 cm distally.  A #10 shunt was prepared, and the  patient was heparinized.  The carotid vessels were occluded with vascular  clamps,   longitudinal opening made in the common carotid with a #15 blade,  extended up the internal carotid with Potts scissors to a point distal to  the disease.  The plaque was about 90% stenotic in severity and quite  irregular.  A #10 shunt was inserted without difficulty, reestablishing flow  in about 2 minutes.  A standard endarterectomy was then performed using  elevator and Potts scissors with eversion endarterectomy to the external  carotid.  The plaque feathered off the distal internal carotid artery nicely  not requiring tacking sutures.  Lumen was thoroughly irrigated with heparin  and saline, all loose debris carefully removed,and arteriotomy closed with a  patch using continuous 6-0 Prolene.  Prior to completion of closure, the  shunt was removed after about 30 minutes of shunt time.  Following antegrade  and retrograde flushing, the closure was completed with reestablishment of  flow initially up the  external and then up the internal branch.  The carotid  was occluded for less than 2 minutes before removal of the shunt.  Protamine was given to reverse the heparin following adequate hemostasis.  The wound was irrigated with saline and closed in layers with Vicryl in  subcuticular fashion.  Sterile dressing applied.  Patient taken to the  recovery room in satisfactory condition.                                                 Quita Skye Hart Rochester, M.D.    JDL/MEDQ  D:  07/03/2002  T:  07/03/2002  Job:  [id number]

## 2010-12-08 NOTE — Op Note (Signed)
NAME:  [name] [name], KEARSE NO.:  [id number]   MEDICAL RECORD NO.:  [id number]                   PATIENT TYPE:  AMB   LOCATION:  [facility]                                 FACILITY:  [facility]   PHYSICIAN:  Rozanna Boer., M.D.      DATE OF BIRTH:  1931/04/27   DATE OF PROCEDURE:  11/24/2003  DATE OF DISCHARGE:                                 OPERATIVE REPORT   PREOPERATIVE DIAGNOSIS:  Urethral stricture, penile prosthesis.   POSTOPERATIVE DIAGNOSIS:  Urethral stricture, penile prosthesis.   OPERATION/PROCEDURE:  Direct vision internal urethrotomy.   ANESTHESIA:  General.   SURGEON:  Courtney Paris, M.D.   BRIEF HISTORY:  This 77 year old black male works at [employer].  He has had  significant obstructive uropathy and was being considered for a TUNA when a  cystoscopy showed a deep bulbous urethral stricture.  He is for a  urethrotomy at this time.  He has history of elevated PSA, but on Proscar  this has returned to normal.  He had a penile prosthesis in 1990 and a CABG  in 1997.   DESCRIPTION OF PROCEDURE:  The patient was placed on the operating table in  the dorsal lithotomy position.  After satisfactory induction of general  anesthesia, was prepped and draped with Betadine in the usual sterile  fashion.  He was given IV antibiotics.  The direct vision urethrotome was  passed into the anterior urethra using a camera.  Pictures were taken of the  stricture down in the deep bulbous urethra.  This was quite tight, probably  about a 4-French size.  A #3 whistle-tip catheter went through this just  barely and then at 12 o'clock, the stricture was incised.  It was fairly  filmy and opened up fairly easily.  Extended for about 0.5 cm or so.  With  his malleable penile prosthesis in, I could not get completely into the  bladder with the urethrotome but I was up into the prostate and __________  had completely been incised.  I pulled back and  hemostasis was good.  We  made a picture of the open urethra and then removed the instrument.  I  passed a #22 Foley catheter without difficulty and left to straight  drainage.  The irrigant was clear.  B&O suppository inserted.  The patient  was taken to the recovery room in good condition.   He will be discharged with the Foley for approximately a week before I  remove it.  He went home in improved ambulatory condition, on a regular  diet.                                               Rozanna Boer., M.D.    HMK/MEDQ  D:  11/24/2003  T:  11/24/2003  Job:  [id number]

## 2010-12-08 NOTE — Assessment & Plan Note (Signed)
[facility]                               PULMONARY OFFICE NOTE   NAME:[name] [name], [name] [name]                        MRN:          [id number]  DATE:05/01/2006                            DOB:          20-Apr-1931    Mr. Moline returns today in followup, 77 year old African American male,  history of chronic obstructive lung disease with hypoxemia with exertion,  but has done well overall now on the Advair 500/50 1 spray b.i.d., finishing  a course of prednisone.  Other maintenance medicines listed in the chart are  correct as reviewed.   PHYSICAL EXAMINATION:  VITAL SIGNS: Temp 97, blood pressure 108/72, pulse  78, saturation 94% on room air.  CHEST:  Showed diminished breath sounds without evidence of wheeze or  rhonchi.  CARDIAC:  Regular rate and rhythm without S3, normal S1, S2.  ABDOMEN:  Soft, nontender.  EXTREMITIES:  Showed no edema or clubbing.  SKIN:  Clear.   IMPRESSION:  Chronic obstructive lung disease with asthmatic bronchitic  component, stable at this time.   PLAN:  1. For the patient to maintain Advair 500/50 one spray b.i.d.  2. For oral irritation use Magic mouthwash 15 mL b.i.d. p.r.n., swish and      gargle and expectorate.  3. We will see the patient back in return followup.       Charlcie Cradle Delford Field, MD, [facility]      PEW/MedQ  DD:  05/01/2006  DT:  05/03/2006  Job #:  [id number]

## 2010-12-08 NOTE — Op Note (Signed)
NAME:  [name] [name], [name] [name] NO.:  [id number]   MEDICAL RECORD NO.:  [id number]          PATIENT TYPE:  OBV   LOCATION:  2807                         FACILITY:  [facility]   PHYSICIAN:  Corky Crafts, MDDATE OF BIRTH:  [birth year]   DATE OF PROCEDURE:  10/30/2005  DATE OF DISCHARGE:                                 OPERATIVE REPORT   PROCEDURES PERFORMED:  Bilateral selective renal angiography and left renal  PTA/stent.   OPERATOR:  Dr. Eldridge Dace.   INDICATIONS:  Renal artery stenosis with renal dysfunction and hypertension.   PROCEDURE:  The risks and benefits of renal angiography and intervention  were explained to the patient and informed consent was obtained.  The  patient was brought to the peripheral vascular lab and placed on the table.  He was prepped and draped in the usual sterile fashion.  His right groin was  infiltrated with 1% lidocaine and a 6-French arterial sheath was inserted  into his right femoral artery using the modified Seldinger technique.  A 5-  [place] diagnostic short IMA catheter was used to engage the right renal  artery under fluoroscopic guidance.  Digital angiography was performed using  hand injection of contrast.  The catheter was then used to engage the left  renal artery.  Digital angiography is performed using hand injection of  contrast.  With the catheter still in place a stabilizer wire was placed  into the vessel.  The diagnostic catheter was then removed while leaving the  stabilizer wire placed.  A Wholey wire was then inserted through the sheath  into the aorta.  A guiding catheter was then inserted over both the 014  stabilizer wire and Wholey wire into the infrarenal aorta.  The [facility] wire  was then withdrawn and the guiding catheter was slid into the left renal  artery over the 014 wire.  A 5 mm x 15 mm Aviator balloon was then advanced  to the lesion and inflated to 8 atmospheres for 45 seconds.  It was then  inflated to  10 atmospheres for 62 seconds.  A third inflation was done to 10  atmospheres for 29 seconds.  It was somewhat difficult to have the balloon  stay on the lesion as it would slide either backwards or forward.  The  lesion was then minimally improved in appearance.  The lesion was then  stetted with a 5.0 x 18 mm Palmaz Blue stent inflated to 14 atmospheres for  60 seconds.  There was still a waist in the stent.  The stent was then post  dilated with 6.0 x 15 Aviator balloon to 10 atmospheres for 48 seconds.  Initially there was a 60-mm gradient when the catheter was engaged.  After  the stent procedure, there was no residual gradient.   IMPRESSION:  1.  Significant left renal artery stenosis.  2.  Successful PTA/stent of the left renal artery with a 5.0 x 18 Palmaz      Blue stent which was then post dilated with a 6.0 balloon.  The ostium      of the  stent was flared with the balloon.  3.  No residual gradient post intervention.   RECOMMENDATIONS:  1.  The patient will continue aspirin 325 mg p.o. q.d. and Plavix 75 mg p.o.      q.d.  2.  He will be observed in the hospital overnight.  3.  The sheath will be removed using manual compression due to his      peripheral vascular disease.      Corky Crafts, MD  Electronically Signed     JSV/MEDQ  D:  10/30/2005  T:  10/30/2005  Job:  [id number]

## 2010-12-08 NOTE — Cardiovascular Report (Signed)
NAME:  [name] [name], [name] [name] NO.:  [id number]   MEDICAL RECORD NO.:  [id number]          PATIENT TYPE:  INP   LOCATION:  2041                         FACILITY:  [facility]   PHYSICIAN:  Armanda Magic, M.D.     DATE OF BIRTH:  November 08, 1930   DATE OF PROCEDURE:  08/26/2006  DATE OF DISCHARGE:                            CARDIAC CATHETERIZATION   REFERRING PHYSICIAN:  Dr. Micki Riley.   PROCEDURE:  Left heart catheterization, coronary angiography.   OPERATOR:  Armanda Magic, M.D.   INDICATIONS:  Chest pain.   COMPLICATIONS:  None.   IV ACCESS:  Via left femoral artery 6-French sheath.   This is a 77 year old African American male with a history of coronary  disease status post coronary bypass grafting in 1997, who presented with  symptoms of chest pain consistent with unstable angina.  Cardiac enzymes  were negative.  He now presents for cardiac catheterization.   The patient is brought to cardiac catheterization laboratory in the  fasting, nonsedated state.  Informed consent was obtained.  The patient  was connected to continuous heart rate and pulse oximetry monitoring and  blood pressure monitoring.  The left groin was prepped and draped in a  sterile fashion.  This groin was used because the patient had had prior  peripheral vascular disease noted with an occluded right iliac artery.  Using modified Seldinger technique a 6-French sheath was placed in the  left femoral artery.  Under fluoroscopic guidance a 6-French JL-4  catheter was attempted to be placed up through the iliac stent that  placed prior a year ago, but the wire would not gain access through the  stent.  The wire was exchanged out for a Wholey wire, which was  successful in gaining access into main aorta.  A 6-French JL-4 catheter  was placed in the left coronary ostium.  Multiple cine films taken in  the 30 degree RAO, 40 degree LAO views.  This catheter was then  exchanged out over a long exchange wire for a  6-French JR-4 catheter,  which was placed in fluoroscopic guidance into the right coronary  artery, and coronary angiography was performed in the 40-degree LAO  view.  This catheter was then pulled back into the saphenous vein graft  to the diagonal and multiple cine films were taken at in the 30 degree  RAO and 40 degree LAO views.  This catheter was then pulled back into  the saphenous vein graft to the obtuse marginal.  Multiple cine films  taken in the 30 degree RAO and 40 degree LAO views.  This catheter was  then attempted to be placed in the saphenous vein graft to the PDA but  could not successfully engage the vein graft ostium.  The catheter was  exchanged out over a long exchange wire for a 6-French right vein graft  catheter and successfully engaged the ostium of the vein graft to the  PDA.  Multiple cine films were taken in the 30 degree RAO and 40 degree  LAO views.  This catheter was exchanged out over a long guidewire for  a  6-French angled pigtail catheter, which was placed under fluoroscopic  guidance in the left ventricular cavity.  Left ventricular pressure was  measured.  Left ventriculography was not performed because of the  patient's has history of underlying renal insufficiency.  The catheter  was pulled back across the aortic valve with no significant gradient  noted.  At the end procedure all catheters and sheaths were removed.  Manual compression was performed until adequate hemostasis was obtained.  The patient was transferred back to the room in stable condition.   RESULTS:  1. The left main coronary artery has an ostial 40% narrowing and then      bifurcates into the left anterior descending artery and left      circumflex artery.  2. The left anterior descending artery is widely patent throughout its      course to the apex.  It gives rise to a first diagonal branch which      appears to have some ostial disease.  Although difficult to      accurately  estimate, it appears to be approximately 50%.  There is      evidence of competitive flow in the vein graft to the diagonal.      There are then two other diagonal branches which take off, both of      which are very small but widely patent.  Between the first and      second diagonal there appears to be an insertion of a LIMA graft      with retrograde filling and is very atretic  3. The left circumflex is patent in its proximal portion giving rise      to a very small obtuse marginal #1 branch and then is occluded.      There is a saphenous vein graft to the OM-1, which fills a second      obtuse marginal branch which is widely patent.  4. The right coronary artery has a 50% narrowing in its ostium and      then gives off a small RV marginal branch and then is occluded.      There is a saphenous vein graft to the PDA which is widely patent,      and there is retrograde filling into the distal RCA/   Left ventricular pressure 106/-12 mmHg and aortic pressure 97/52 mmHg.  LVEDP is 5 mmHg.   ASSESSMENT:  1. Severe two-vessel coronary disease, also with 40% ostial left main.  2. Patent patent saphenous vein graft to the obtuse marginal, patent      saphenous vein graft to the first diagonal, patent saphenous vein      graft to posterior descending artery.  The left internal mammary      artery graft is atretic with a widely patent left anterior      descending artery.  There is evidence of ostial left main disease      of approximately 40%.   PLAN:  1. Continue aspirin.  2. GI workup for chest pain.  3. Will transfer the hospital service for further evaluation of chest      pain since he continues have chest pain.  Will check a D-dimer.      Armanda Magic, M.D.  Electronically Signed     TT/MEDQ  D:  08/26/2006  T:  08/26/2006  Job:  [id number]   cc:   Dr. Micki Riley

## 2010-12-08 NOTE — Group Therapy Note (Signed)
REFERRAL:  Evelene Croon, M.D.   PURPOSE OF EVALUATION:  Evaluate and treat chronic chest incisional pain.   HISTORY OF PRESENT ILLNESS:  Mr. Caltagirone is a 77 year old adult male referred  to this office by Dr. Laneta Simmers for treatment of chronic chest incisional pain.   The patient does have a history of coronary artery bypass grafting x4 May  1997 performed by Dr. Laneta Simmers.  The patient reports that subsequent to his  surgery he began experiencing pain in the scar region and that pain has  persisted steadily since 1997.  The patient reports that he tried to put up  with it for several years and actually did not bring this to Dr. Sharee Pimple  attention until some time in 2004.   Medical records indicate that he was seen by Dr. Laneta Simmers on June 15, 2003  and had the complaint of chronic chest incisional pain.  He had burning in  his chest in the incision which was no better with time.  The diagnosis at  that time was hypersensitivity of the nerves applying to skin around his  incisional area and he was referred to this office for chronic pain  management.   The patient reports that the pain waxes and wanes in the area of his chest  but does interfere with activities including his work and his sexual  intimacy with his wife.  He reports that the pain never is relieved  completely and that he cannot even wear a seatbelt across his chest  secondary to the discomfort.  He has not sought any other medical attention  prior to this appointment apart from Dr. Sharee Pimple appointment.   PAST MEDICAL HISTORY:  1. Hypercholesterolemia.  2. Hypertension.  3. Coronary artery bypass grafting x4 May 1997 by Dr. Laneta Simmers.  4. Left carotid artery stenosis of 80% treated with carotid endarterectomy.  5. Mild renal insufficiency secondary to ACE inhibitor.  6. History of gastroparesis.  7. Aortic valve sclerosis with mild aortic insufficiency.  8. Irritable bowel syndrome.   FAMILY HISTORY:  His father is  deceased at age 68 secondary to an MI.  His  mother is age 56 and in reasonable health.  He has 10 living siblings in  addition to two that have passed away.  There were a total of 13 children in  his family.   SOCIAL HISTORY:  The patient reports that he quit tobacco approximately 14  years ago.  He still admits to occasional alcohol usage approximately once  to twice per week.  He is widowed but has remarried.  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   MEDICATIONS:  1. Norvasc 10 mg once daily.  2. Prevacid 30 mg once daily.  3. Proscar 5 mg once daily.  4. Zelnorm 2 mg b.i.d.  5. Aspirin 81 mg once daily.   PHYSICAL EXAMINATION:  Well-appearing adult male in acute distress only  whenever his chest is stretched or touched.  Blood pressure was measured at  154/85 with a pulse of 77 and O2 saturation 99% on room air.  He has  strength of 5/5 throughout the bilateral upper and lower extremities.  Bulk  and tone were normal and reflexes were 2+ and symmetrical.  Sensation was  intact to light touch throughout his bilateral upper and lower extremities.   Examination of his chest shows  an incision approximately 9 inches in length  and measuring approximately 1 to 1-1/2 inches in width.  He is tender both  in the midline of his incision and even out approximately 1 to 1-1/2 inches  on both sides of his incision.  These areas were tender even to light touch  and he experienced significant pain even whenever he moved his upper  extremities during manual muscle testing.  There were a few areas of closed  lesions on the inferior aspect of his abdomen but these were nonpainful.  The incision itself is completely healed with no areas of breakdown.   IMPRESSION:  Chronic incisional pain after coronary artery bypass grafting.   At  the present time patient has really been tried on no medications for  this.  I have recommended a trial of Neurontin at 300 mg bedtime and then we  will adjust the dose as necessary.  The width of the incision is  approximately 1-1/2 inches and then he is tender on both sides such that  there is an area approximately 4-5 inches in width and 9 inches in length of  his chest that is extremely painful.  I doubt that injection would be  successful for the large area that is painful for him.  Furthermore, nerve  roots on either side of his thoracic spine would probably be involved at  this point and I doubt that injections would give him significant benefit.  We will plan on trying the oral medications which hopefully will allow  relief over a larger surface area.   The patient seems comfortable with the plan and I have reviewed side effects  with him concerning this medication.  We will start him at 300 mg a night  and see how he does then follow up with him in 1 month's time.  We will  adjust the dose as necessary over the next several weeks.     Ellwood Dense, M.D.   DC/MedQ  D:  08/16/2003 12:13:52  T:  08/16/2003 13:38:43  Job #:  [id number]   cc:   Evelene Croon, M.D.  [address]  [place]  [place] [address]

## 2010-12-11 ENCOUNTER — Ambulatory Visit
Admission: RE | Admit: 2010-12-11 | Discharge: 2010-12-11 | Disposition: A | Discharge status: Home / Self Care | Payer: Medicare Other | Source: Ambulatory Visit | Attending: Gastroenterology | Admitting: Gastroenterology

## 2010-12-11 DIAGNOSIS — R112 Nausea with vomiting, unspecified: Secondary | ICD-10-CM

## 2010-12-13 ENCOUNTER — Other Ambulatory Visit (HOSPITAL_COMMUNITY): Payer: Self-pay | Admitting: Gastroenterology

## 2010-12-13 DIAGNOSIS — R112 Nausea with vomiting, unspecified: Secondary | ICD-10-CM

## 2010-12-25 ENCOUNTER — Ambulatory Visit (HOSPITAL_COMMUNITY)
Admission: RE | Admit: 2010-12-25 | Discharge: 2010-12-25 | Disposition: A | Discharge status: Home / Self Care | Payer: Medicare Other | Source: Ambulatory Visit | Attending: Gastroenterology | Admitting: Gastroenterology

## 2010-12-25 DIAGNOSIS — R112 Nausea with vomiting, unspecified: Secondary | ICD-10-CM

## 2010-12-25 MED ORDER — TECHNETIUM TC 99M MEBROFENIN IV KIT
5.0000 | PACK | Freq: Once | INTRAVENOUS | Status: AC | PRN
  Administered 2010-12-25: 5 via INTRAVENOUS

## 2011-02-21 ENCOUNTER — Ambulatory Visit (INDEPENDENT_AMBULATORY_CARE_PROVIDER_SITE_OTHER): Payer: Medicare Other | Admitting: Critical Care Medicine

## 2011-02-21 ENCOUNTER — Encounter: Payer: Self-pay | Admitting: Critical Care Medicine

## 2011-02-21 DIAGNOSIS — N259 Disorder resulting from impaired renal tubular function, unspecified: Secondary | ICD-10-CM

## 2011-02-21 DIAGNOSIS — J449 Chronic obstructive pulmonary disease, unspecified: Secondary | ICD-10-CM

## 2011-02-21 DIAGNOSIS — J209 Acute bronchitis, unspecified: Secondary | ICD-10-CM

## 2011-02-21 DIAGNOSIS — J45909 Unspecified asthma, uncomplicated: Secondary | ICD-10-CM

## 2011-02-21 MED ORDER — BUDESONIDE-FORMOTEROL FUMARATE 160-4.5 MCG/ACT IN AERO: 2.0000 | INHALATION_SPRAY | Freq: Two times a day (BID) | RESPIRATORY_TRACT | Status: DC

## 2011-02-21 MED ORDER — AZITHROMYCIN 250 MG PO TABS: ORAL_TABLET | ORAL | Status: DC

## 2011-02-21 MED ORDER — PREDNISONE 10 MG PO TABS: ORAL_TABLET | ORAL | Status: DC

## 2011-02-21 NOTE — Patient Instructions (Signed)
Prednisone Take 4 for two days, three for two days, two for two days, then one for two days and stop Azithromycin 250mg  , two once then one daily until gone Refill on symbicort sent Follow reflux diet Return 4 months

## 2011-02-21 NOTE — Progress Notes (Signed)
Subjective:    Patient ID: [name] [name], male    DOB: Apr 13, 1931, 77 y.o.   MRN: [id number]  HPI History of Present Illness:  Pulmonary OV:  [name] [name] is a 77 year old African American male with history of chronic  obstructive lung disease - gold stage 3, asthmatic bronchitis, hypertension, reflux  disease. Quit smoking in 1994. He has had previous bypass surgery in 1997 Previous chronic chest wall pain after CABG followed at pain clinic.  August 16, 2010 10:44 AM  Not seen since 2010.  The pt was ok until 2 months ago, started getting congested. Now more dyspneic. Notes more excess mucus. No chest pain  No f/c/s.  Pt cannot afford meds   02/21/2011 Still with access issues with meds.  More dyspnea and cough with weather.  Notes more nausea. Cannot eat and kidney issues.  Past Medical History  Diagnosis Date  . Renal insufficiency   . PVD (peripheral vascular disease)   . Hypertension   . Hyperlipemia   . GERD (gastroesophageal reflux disease)   . CAD (coronary artery disease)   . COPD (chronic obstructive pulmonary disease)   . Monoclonal gammopathy      History reviewed. No pertinent family history.   History   Social History  . Marital Status: Married    Spouse Name: N/A    Number of Children: N/A  . Years of Education: N/A   Occupational History  . Not on file.   Social History Main Topics  . Smoking status: Former Smoker -- 0.5 packs/day for 40 years    Types: Cigarettes    Quit date: 07/23/1978  . Smokeless tobacco: Never Used  . Alcohol Use: Not on file  . Drug Use: Not on file  . Sexually Active: Not on file   Other Topics Concern  . Not on file   Social History Narrative  . No narrative on file     No Known Allergies   Outpatient Prescriptions Prior to Visit  Medication Sig Dispense Refill  . ALPRAZolam (XANAX) 0.5 MG tablet Take 0.5 mg by mouth as needed.        Marland Kitchen aspirin 325 MG tablet Take 325 mg by mouth daily.        . finasteride  (PROSCAR) 5 MG tablet Take 5 mg by mouth daily.        Marland Kitchen gabapentin (NEURONTIN) 400 MG capsule Take 400 mg by mouth 3 (three) times daily.        Marland Kitchen galantamine (RAZADYNE ER) 24 MG 24 hr capsule Take 24 mg by mouth daily with breakfast.        . Multiple Vitamin (MULTIVITAMIN) tablet Take 1 tablet by mouth daily.        Marland Kitchen omeprazole (PRILOSEC) 20 MG capsule Take 20 mg by mouth daily.        . sodium bicarbonate 325 MG tablet Take 325 mg by mouth 2 (two) times daily.        . Tamsulosin HCl (FLOMAX) 0.4 MG CAPS Take 0.4 mg by mouth daily.        . traMADol (ULTRAM) 50 MG tablet Take 50 mg by mouth 3 (three) times daily as needed.        . budesonide-formoterol (SYMBICORT) 160-4.5 MCG/ACT inhaler Inhale 2 puffs into the lungs 2 (two) times daily.        . benazepril (LOTENSIN) 20 MG tablet Take 20 mg by mouth daily.        . hydrochlorothiazide 25 MG  tablet Take 12.5 mg by mouth daily.        . rosuvastatin (CRESTOR) 20 MG tablet Take 20 mg by mouth daily.           Review of Systems Constitutional:   No  weight loss, night sweats,  Fevers, chills, fatigue, lassitude. HEENT:   No headaches,  Difficulty swallowing,  Tooth/dental problems,  Sore throat,                No sneezing, itching, ear ache, nasal congestion, post nasal drip,   CV:  No chest pain,  Orthopnea, PND, swelling in lower extremities, anasarca, dizziness, palpitations  GI  No heartburn, indigestion, abdominal pain, nausea, vomiting, diarrhea, change in bowel habits, loss of appetite  Resp: Notes  shortness of breath with exertion not  at rest.  No excess mucus, no productive cough,  No non-productive cough,  No coughing up of blood.  No change in color of mucus.  No wheezing.  No chest wall deformity  Skin: no rash or lesions.  GU: no dysuria, change in color of urine, no urgency or frequency.  No flank pain.  MS:  No joint pain or swelling.  No decreased range of motion.  No back pain.  Psych:  No change in mood or  affect. No depression or anxiety.  No memory loss.     Objective:   Physical Exam Filed Vitals:   02/21/11 1133  BP: 122/70  Pulse: 72  Temp: 97.9 F (36.6 C)  TempSrc: Oral  Height: 5\' 9"  (1.753 m)  Weight: 175 lb (79.379 kg)  SpO2: 97%    Gen: Pleasant, well-nourished, in no distress,  normal affect  ENT: No lesions,  mouth clear,  oropharynx clear, no postnasal drip  Neck: No JVD, no TMG, no carotid bruits  Lungs: No use of accessory muscles, no dullness to percussion, distant BS, exp wheeze   Cardiovascular: RRR, heart sounds normal, no murmur or gallops, no peripheral edema  Abdomen: soft and NT, no HSM,  BS normal  Musculoskeletal: No deformities, no cyanosis or clubbing  Neuro: alert, non focal  Skin: Warm, no lesions or rashes        Assessment & Plan:   COPD Tracheobronchitis with flare Plan Pulse pred Azithromycinx 5days Cont advair     Updated Medication List Outpatient Encounter Prescriptions as of 02/21/2011  Medication Sig Dispense Refill  . ALPRAZolam (XANAX) 0.5 MG tablet Take 0.5 mg by mouth as needed.        Marland Kitchen aspirin 325 MG tablet Take 325 mg by mouth daily.        . budesonide-formoterol (SYMBICORT) 160-4.5 MCG/ACT inhaler Inhale 2 puffs into the lungs 2 (two) times daily.  1 Inhaler  6  . finasteride (PROSCAR) 5 MG tablet Take 5 mg by mouth daily.        Marland Kitchen gabapentin (NEURONTIN) 400 MG capsule Take 400 mg by mouth 3 (three) times daily.        Marland Kitchen galantamine (RAZADYNE ER) 24 MG 24 hr capsule Take 24 mg by mouth daily with breakfast.        . lubiprostone (AMITIZA) 24 MCG capsule Take 24 mcg by mouth 2 (two) times daily with a meal.        . mirtazapine (REMERON) 15 MG tablet Take 1 tablet by mouth at bedtime.      . Multiple Vitamin (MULTIVITAMIN) tablet Take 1 tablet by mouth daily.        . nitroGLYCERIN (NITROSTAT)  0.4 MG SL tablet Place 0.4 mg under the tongue every 5 (five) minutes as needed.        Marland Kitchen omeprazole (PRILOSEC) 20 MG  capsule Take 20 mg by mouth daily.        . promethazine (PHENERGAN) 25 MG tablet 1-2 tablets by mouth every 6 hours as needed for nausea      . sodium bicarbonate 325 MG tablet Take 325 mg by mouth 2 (two) times daily.        . Tamsulosin HCl (FLOMAX) 0.4 MG CAPS Take 0.4 mg by mouth daily.        . traMADol (ULTRAM) 50 MG tablet Take 50 mg by mouth 3 (three) times daily as needed.        Marland Kitchen DISCONTD: budesonide-formoterol (SYMBICORT) 160-4.5 MCG/ACT inhaler Inhale 2 puffs into the lungs 2 (two) times daily.        Marland Kitchen azithromycin (ZITHROMAX) 250 MG tablet Take two once then one daily until gone  6 each  0  . predniSONE (DELTASONE) 10 MG tablet Take 4 for two days, three for two days, two for two days, then one for two days and stop   20 tablet  0  . DISCONTD: benazepril (LOTENSIN) 20 MG tablet Take 20 mg by mouth daily.        Marland Kitchen DISCONTD: hydrochlorothiazide 25 MG tablet Take 12.5 mg by mouth daily.        Marland Kitchen DISCONTD: rosuvastatin (CRESTOR) 20 MG tablet Take 20 mg by mouth daily.

## 2011-02-21 NOTE — Assessment & Plan Note (Signed)
Tracheobronchitis with flare Plan Pulse pred Azithromycinx 5days Cont advair

## 2011-03-19 ENCOUNTER — Encounter: Payer: Medicare Other | Admitting: Neurosurgery

## 2011-04-04 ENCOUNTER — Ambulatory Visit: Payer: Medicare Other | Admitting: Neurosurgery

## 2011-04-12 ENCOUNTER — Ambulatory Visit: Payer: Medicare Other | Admitting: Physical Medicine & Rehabilitation

## 2011-04-19 ENCOUNTER — Other Ambulatory Visit: Payer: Self-pay | Admitting: Oncology

## 2011-04-19 ENCOUNTER — Encounter (HOSPITAL_BASED_OUTPATIENT_CLINIC_OR_DEPARTMENT_OTHER): Payer: Medicare Other | Admitting: Oncology

## 2011-04-19 DIAGNOSIS — D472 Monoclonal gammopathy: Secondary | ICD-10-CM

## 2011-04-19 DIAGNOSIS — N189 Chronic kidney disease, unspecified: Secondary | ICD-10-CM

## 2011-04-19 LAB — CBC WITH DIFFERENTIAL/PLATELET
BASO%: 0.2 % (ref 0.0–2.0)
Basophils Absolute: 0 10*3/uL (ref 0.0–0.1)
Eosinophils Absolute: 0.1 10*3/uL (ref 0.0–0.5)
HCT: 35 % — ABNORMAL LOW (ref 38.4–49.9)
HGB: 12.1 g/dL — ABNORMAL LOW (ref 13.0–17.1)
MONO#: 0.5 10*3/uL (ref 0.1–0.9)
NEUT#: 2.7 10*3/uL (ref 1.5–6.5)
NEUT%: 53.6 % (ref 39.0–75.0)
WBC: 5 10*3/uL (ref 4.0–10.3)
lymph#: 1.7 10*3/uL (ref 0.9–3.3)

## 2011-04-23 LAB — POCT I-STAT 4, (NA,K, GLUC, HGB,HCT)
Glucose, Bld: 90
HCT: 40
Hemoglobin: 13.6

## 2011-04-24 ENCOUNTER — Encounter (HOSPITAL_BASED_OUTPATIENT_CLINIC_OR_DEPARTMENT_OTHER): Payer: Medicare Other | Admitting: Oncology

## 2011-04-24 DIAGNOSIS — D472 Monoclonal gammopathy: Secondary | ICD-10-CM

## 2011-04-24 DIAGNOSIS — D649 Anemia, unspecified: Secondary | ICD-10-CM

## 2011-04-24 DIAGNOSIS — N289 Disorder of kidney and ureter, unspecified: Secondary | ICD-10-CM

## 2011-04-24 LAB — DIFFERENTIAL
Basophils Absolute: 0
Lymphs Abs: 1.1
Monocytes Relative: 2 — ABNORMAL LOW

## 2011-04-24 LAB — URINALYSIS, ROUTINE W REFLEX MICROSCOPIC
Hgb urine dipstick: NEGATIVE
Protein, ur: NEGATIVE
Urobilinogen, UA: 0.2

## 2011-04-24 LAB — COMPREHENSIVE METABOLIC PANEL
Alkaline Phosphatase: 40
BUN: 21
Calcium: 9.1
Creatinine, Ser: 1.27
Glucose, Bld: 101 — ABNORMAL HIGH
Potassium: 4.7
Total Protein: 6

## 2011-04-24 LAB — CBC
MCHC: 34.5
MCV: 97.2
Platelets: 157
WBC: 10.9 — ABNORMAL HIGH

## 2011-04-26 LAB — COMPREHENSIVE METABOLIC PANEL
ALT: 13 U/L (ref 0–53)
AST: 21 U/L (ref 0–37)
Calcium: 9.2 mg/dL (ref 8.4–10.5)
Chloride: 112 mEq/L (ref 96–112)
Creatinine, Ser: 2.47 mg/dL — ABNORMAL HIGH (ref 0.50–1.35)
Total Bilirubin: 0.4 mg/dL (ref 0.3–1.2)

## 2011-04-30 LAB — PROTEIN ELECTROPHORESIS, SERUM
Alpha-2-Globulin: 10.2 % (ref 7.1–11.8)
Beta 2: 4.8 % (ref 3.2–6.5)
Beta Globulin: 5.4 % (ref 4.7–7.2)
Gamma Globulin: 20.6 % — ABNORMAL HIGH (ref 11.1–18.8)

## 2011-05-22 ENCOUNTER — Ambulatory Visit: Payer: Medicare Other | Admitting: Physical Medicine & Rehabilitation

## 2011-05-30 ENCOUNTER — Ambulatory Visit: Payer: Medicare Other | Admitting: Critical Care Medicine

## 2011-06-18 ENCOUNTER — Ambulatory Visit: Payer: Medicare Other | Admitting: Physical Medicine & Rehabilitation

## 2011-06-22 ENCOUNTER — Ambulatory Visit (INDEPENDENT_AMBULATORY_CARE_PROVIDER_SITE_OTHER): Payer: Medicare Other | Admitting: Critical Care Medicine

## 2011-06-22 ENCOUNTER — Encounter: Payer: Self-pay | Admitting: Critical Care Medicine

## 2011-06-22 DIAGNOSIS — J449 Chronic obstructive pulmonary disease, unspecified: Secondary | ICD-10-CM

## 2011-06-22 MED ORDER — MOXIFLOXACIN HCL 400 MG PO TABS: ORAL_TABLET | ORAL | Status: DC

## 2011-06-22 MED ORDER — PREDNISONE 10 MG PO TABS: ORAL_TABLET | ORAL | Status: DC

## 2011-06-22 NOTE — Progress Notes (Signed)
Subjective:    Patient ID: [name] [name], male    DOB: [birth year], 77 y.o.   MRN: [id number]  HPI  History of Present Illness:  Pulmonary OV:  Mr. Shi is a 77 y.o.   African American male with history of chronic  obstructive lung disease - gold stage 3, asthmatic bronchitis, hypertension, reflux  disease. Quit smoking in 1994. He has had previous bypass surgery in 1997 Previous chronic chest wall pain after CABG followed at pain clinic.   11/30 Pt notes for one month more cough with yellow mucus. Notes more wheezing.  No real chest pain Notes some heavy pain.  Notes edema in the feet.  Past Medical History  Diagnosis Date  . Renal insufficiency   . PVD (peripheral vascular disease)   . Hypertension   . Hyperlipemia   . GERD (gastroesophageal reflux disease)   . CAD (coronary artery disease)   . COPD (chronic obstructive pulmonary disease)   . Monoclonal gammopathy      History reviewed. No pertinent family history.   History   Social History  . Marital Status: Married    Spouse Name: N/A    Number of Children: N/A  . Years of Education: N/A   Occupational History  . Not on file.   Social History Main Topics  . Smoking status: Former Smoker -- 0.5 packs/day for 40 years    Types: Cigarettes    Quit date: 07/23/1978  . Smokeless tobacco: Never Used  . Alcohol Use: Not on file  . Drug Use: Not on file  . Sexually Active: Not on file   Other Topics Concern  . Not on file   Social History Narrative  . No narrative on file     No Known Allergies   Outpatient Prescriptions Prior to Visit  Medication Sig Dispense Refill  . ALPRAZolam (XANAX) 0.5 MG tablet Take 0.5 mg by mouth as needed.        Marland Kitchen aspirin 325 MG tablet Take 325 mg by mouth daily.        . budesonide-formoterol (SYMBICORT) 160-4.5 MCG/ACT inhaler Inhale 2 puffs into the lungs 2 (two) times daily.  1 Inhaler  6  . finasteride (PROSCAR) 5 MG tablet Take 5 mg by mouth daily.        Marland Kitchen gabapentin  (NEURONTIN) 400 MG capsule Take 400 mg by mouth 3 (three) times daily.        Marland Kitchen galantamine (RAZADYNE ER) 24 MG 24 hr capsule Take 24 mg by mouth daily with breakfast.        . lubiprostone (AMITIZA) 24 MCG capsule Take 24 mcg by mouth 2 (two) times daily with a meal.        . mirtazapine (REMERON) 15 MG tablet Take 1 tablet by mouth at bedtime.      . Multiple Vitamin (MULTIVITAMIN) tablet Take 1 tablet by mouth daily.        . nitroGLYCERIN (NITROSTAT) 0.4 MG SL tablet Place 0.4 mg under the tongue every 5 (five) minutes as needed.        Marland Kitchen omeprazole (PRILOSEC) 20 MG capsule Take 20 mg by mouth daily.        . promethazine (PHENERGAN) 25 MG tablet 1-2 tablets by mouth every 6 hours as needed for nausea      . sodium bicarbonate 325 MG tablet Take 325 mg by mouth 2 (two) times daily.        . Tamsulosin HCl (FLOMAX) 0.4 MG CAPS Take  0.4 mg by mouth daily.        . traMADol (ULTRAM) 50 MG tablet Take 50 mg by mouth 3 (three) times daily as needed.        Marland Kitchen azithromycin (ZITHROMAX) 250 MG tablet Take two once then one daily until gone  6 each  0  . predniSONE (DELTASONE) 10 MG tablet Take 4 for two days, three for two days, two for two days, then one for two days and stop   20 tablet  0     Review of Systems  Constitutional:   No  weight loss, night sweats,  Fevers, chills, fatigue, lassitude. HEENT:   No headaches,  Difficulty swallowing,  Tooth/dental problems,  Sore throat,                No sneezing, itching, ear ache, nasal congestion, post nasal drip,   CV:  No chest pain,  Orthopnea, PND, swelling in lower extremities, anasarca, dizziness, palpitations  GI  No heartburn, indigestion, abdominal pain, nausea, vomiting, diarrhea, change in bowel habits, loss of appetite  Resp: Notes  shortness of breath with exertion not  at rest.  No excess mucus, no productive cough,  No non-productive cough,  No coughing up of blood.  No change in color of mucus.  No wheezing.  No chest wall  deformity  Skin: no rash or lesions.  GU: no dysuria, change in color of urine, no urgency or frequency.  No flank pain.  MS:  No joint pain or swelling.  No decreased range of motion.  No back pain.  Psych:  No change in mood or affect. No depression or anxiety.  No memory loss.     Objective:   Physical Exam  Filed Vitals:   06/22/11 1401  BP: 122/74  Pulse: 85  Temp: 97.9 F (36.6 C)  TempSrc: Oral  Height: 5\' 8"  (1.727 m)  Weight: 175 lb 9.6 oz (79.652 kg)  SpO2: 95%    Gen: Pleasant, well-nourished, in no distress,  normal affect  ENT: No lesions,  mouth clear,  oropharynx clear, no postnasal drip  Neck: No JVD, no TMG, no carotid bruits  Lungs: No use of accessory muscles, no dullness to percussion, distant BS, exp wheeze   Cardiovascular: RRR, heart sounds normal, no murmur or gallops, no peripheral edema  Abdomen: soft and NT, no HSM,  BS normal  Musculoskeletal: No deformities, no cyanosis or clubbing  Neuro: alert, non focal  Skin: Warm, no lesions or rashes        Assessment & Plan:   COPD Moderate Copd with tracheobronchitis flare Plan Prednisone 10mg  Take 4 for three days 3 for three days 2 for three days 1 for three days and stop Avelox 1/2 tablet daily until gone (200mg /d x 10days) No other medication changes Return 2 months      Updated Medication List Outpatient Encounter Prescriptions as of 06/22/2011  Medication Sig Dispense Refill  . ALPRAZolam (XANAX) 0.5 MG tablet Take 0.5 mg by mouth as needed.        Marland Kitchen aspirin 325 MG tablet Take 325 mg by mouth daily.        . budesonide-formoterol (SYMBICORT) 160-4.5 MCG/ACT inhaler Inhale 2 puffs into the lungs 2 (two) times daily.  1 Inhaler  6  . finasteride (PROSCAR) 5 MG tablet Take 5 mg by mouth daily.        Marland Kitchen gabapentin (NEURONTIN) 400 MG capsule Take 400 mg by mouth 3 (three) times daily.        Marland Kitchen  galantamine (RAZADYNE ER) 24 MG 24 hr capsule Take 24 mg by mouth daily with  breakfast.        . lubiprostone (AMITIZA) 24 MCG capsule Take 24 mcg by mouth 2 (two) times daily with a meal.        . mirtazapine (REMERON) 15 MG tablet Take 1 tablet by mouth at bedtime.      . Multiple Vitamin (MULTIVITAMIN) tablet Take 1 tablet by mouth daily.        . nitroGLYCERIN (NITROSTAT) 0.4 MG SL tablet Place 0.4 mg under the tongue every 5 (five) minutes as needed.        Marland Kitchen omeprazole (PRILOSEC) 20 MG capsule Take 20 mg by mouth daily.        . promethazine (PHENERGAN) 25 MG tablet 1-2 tablets by mouth every 6 hours as needed for nausea      . sodium bicarbonate 325 MG tablet Take 325 mg by mouth 2 (two) times daily.        . SPS 15 GM/60ML suspension       . Tamsulosin HCl (FLOMAX) 0.4 MG CAPS Take 0.4 mg by mouth daily.        . traMADol (ULTRAM) 50 MG tablet Take 50 mg by mouth 3 (three) times daily as needed.        Marland Kitchen DISCONTD: azithromycin (ZITHROMAX) 250 MG tablet Take two once then one daily until gone  6 each  0  . DISCONTD: predniSONE (DELTASONE) 10 MG tablet Take 4 for two days, three for two days, two for two days, then one for two days and stop   20 tablet  0  . moxifloxacin (AVELOX) 400 MG tablet Take 1/2 tablet daily  5 tablet  0  . predniSONE (DELTASONE) 10 MG tablet Take 4 for three days 3 for three days 2 for three days 1 for three days and stop  30 tablet  0

## 2011-06-22 NOTE — Patient Instructions (Signed)
Prednisone 10mg  Take 4 for three days 3 for three days 2 for three days 1 for three days and stop Avelox 1/2 tablet daily until gone No other medication changes Return 2 months

## 2011-06-23 NOTE — Assessment & Plan Note (Signed)
Moderate Copd with tracheobronchitis flare Plan Prednisone 10mg  Take 4 for three days 3 for three days 2 for three days 1 for three days and stop Avelox 1/2 tablet daily until gone (200mg /d x 10days) No other medication changes Return 2 months

## 2011-07-06 ENCOUNTER — Encounter: Payer: Medicare Other | Attending: Physical Medicine & Rehabilitation

## 2011-07-06 ENCOUNTER — Ambulatory Visit (HOSPITAL_BASED_OUTPATIENT_CLINIC_OR_DEPARTMENT_OTHER): Payer: Medicare Other | Admitting: Physical Medicine & Rehabilitation

## 2011-07-06 DIAGNOSIS — G8928 Other chronic postprocedural pain: Secondary | ICD-10-CM | POA: Insufficient documentation

## 2011-07-06 DIAGNOSIS — IMO0002 Reserved for concepts with insufficient information to code with codable children: Secondary | ICD-10-CM

## 2011-07-06 DIAGNOSIS — M961 Postlaminectomy syndrome, not elsewhere classified: Secondary | ICD-10-CM | POA: Insufficient documentation

## 2011-07-06 NOTE — Assessment & Plan Note (Signed)
HISTORY:  A 77 year old male for chronic postoperative pain, post laminectomy syndrome, status post L5-S1 laminotomy in June 2011.  He has had no new problems.  He continues to walk a mile and a half a day.  He has some bladder control problems, but this is chronic.  He has some anxiety, nausea, poor appetite, shortness of breath, wheezing.  PAST MEDICAL HISTORY:  Hypertension, kidney disease.  SOCIAL HISTORY:  Married, lives with his wife.  FAMILY HISTORY:  Hypertension.  PHYSICAL EXAMINATION:  VITAL SIGNS:  Blood pressure 146/76, pulse 78, respirations 16, O2 saturation 99% on room air. GENERAL:  No acute distress.  Mood and affect appropriate.  His straight leg raising test is negative.  Deep tendon reflexes reduced in left ankle, normal on the right ankle, normal bilateral knees.  Sensation is intact bilateral lower extremities.  His lumbar spine range of motion is normal.  Gait is normal.  IMPRESSION:  Lumbar post laminectomy syndrome.  He has a chronic radicular discomfort left lower extremity primarily, this is well controlled with current medications.  He has a good functional status. I do not see any need for imaging studies.  We will continue his current medications which consist of tramadol 50 mg t.i.d., gabapentin 400 mg t.i.d.  The patient agrees with this plan.  Other medications recorded and reviewed; 1. Remeron 30 mg a day. 2. Amitiza 24 mcg b.i.d. 3. Prilosec 20 daily. 4. Nitroglycerin p.r.n. 5. Symbicort 2 puffs b.i.d. 6. Flomax 0.4 daily. 7. MVI daily. 8. Sodium bicarbonate b.i.d.  I will see him back in about 6 months.     Erick Colace, M.D. Electronically Signed    AEK/MedQ D:  07/06/2011 10:46:30  T:  07/06/2011 12:27:59  Job #:  [id number]

## 2011-07-16 ENCOUNTER — Encounter (HOSPITAL_COMMUNITY): Payer: Self-pay | Admitting: *Deleted

## 2011-07-16 ENCOUNTER — Other Ambulatory Visit: Payer: Self-pay

## 2011-07-16 ENCOUNTER — Inpatient Hospital Stay (HOSPITAL_COMMUNITY)
Admission: EM | Admit: 2011-07-16 | Discharge: 2011-07-18 | DRG: 194 | Disposition: A | Discharge status: Home / Self Care | Payer: Medicare Other | Attending: Internal Medicine | Admitting: Internal Medicine

## 2011-07-16 ENCOUNTER — Emergency Department (HOSPITAL_COMMUNITY): Payer: Medicare Other

## 2011-07-16 DIAGNOSIS — E860 Dehydration: Secondary | ICD-10-CM

## 2011-07-16 DIAGNOSIS — E785 Hyperlipidemia, unspecified: Secondary | ICD-10-CM

## 2011-07-16 DIAGNOSIS — J449 Chronic obstructive pulmonary disease, unspecified: Secondary | ICD-10-CM

## 2011-07-16 DIAGNOSIS — E875 Hyperkalemia: Secondary | ICD-10-CM | POA: Diagnosis present

## 2011-07-16 DIAGNOSIS — I2510 Atherosclerotic heart disease of native coronary artery without angina pectoris: Secondary | ICD-10-CM

## 2011-07-16 DIAGNOSIS — J309 Allergic rhinitis, unspecified: Secondary | ICD-10-CM

## 2011-07-16 DIAGNOSIS — J09X2 Influenza due to identified novel influenza A virus with other respiratory manifestations: Principal | ICD-10-CM | POA: Diagnosis present

## 2011-07-16 DIAGNOSIS — M2550 Pain in unspecified joint: Secondary | ICD-10-CM

## 2011-07-16 DIAGNOSIS — J4489 Other specified chronic obstructive pulmonary disease: Secondary | ICD-10-CM

## 2011-07-16 DIAGNOSIS — R509 Fever, unspecified: Secondary | ICD-10-CM

## 2011-07-16 DIAGNOSIS — D472 Monoclonal gammopathy: Secondary | ICD-10-CM

## 2011-07-16 DIAGNOSIS — I739 Peripheral vascular disease, unspecified: Secondary | ICD-10-CM

## 2011-07-16 DIAGNOSIS — F0390 Unspecified dementia without behavioral disturbance: Secondary | ICD-10-CM | POA: Diagnosis present

## 2011-07-16 DIAGNOSIS — K219 Gastro-esophageal reflux disease without esophagitis: Secondary | ICD-10-CM

## 2011-07-16 DIAGNOSIS — R071 Chest pain on breathing: Secondary | ICD-10-CM

## 2011-07-16 DIAGNOSIS — N183 Chronic kidney disease, stage 3 unspecified: Secondary | ICD-10-CM | POA: Diagnosis present

## 2011-07-16 DIAGNOSIS — N259 Disorder resulting from impaired renal tubular function, unspecified: Secondary | ICD-10-CM

## 2011-07-16 DIAGNOSIS — B954 Other streptococcus as the cause of diseases classified elsewhere: Secondary | ICD-10-CM | POA: Diagnosis present

## 2011-07-16 DIAGNOSIS — I10 Essential (primary) hypertension: Secondary | ICD-10-CM

## 2011-07-16 DIAGNOSIS — R7881 Bacteremia: Secondary | ICD-10-CM | POA: Diagnosis present

## 2011-07-16 LAB — CBC
MCH: 33.3 pg (ref 26.0–34.0)
MCHC: 33.7 g/dL (ref 30.0–36.0)
MCV: 98.9 fL (ref 78.0–100.0)
Platelets: 129 10*3/uL — ABNORMAL LOW (ref 150–400)
RDW: 14 % (ref 11.5–15.5)

## 2011-07-16 LAB — COMPREHENSIVE METABOLIC PANEL
ALT: 16 U/L (ref 0–53)
AST: 20 U/L (ref 0–37)
Calcium: 9.4 mg/dL (ref 8.4–10.5)
Creatinine, Ser: 2.33 mg/dL — ABNORMAL HIGH (ref 0.50–1.35)
GFR calc Af Amer: 29 mL/min — ABNORMAL LOW (ref >90)
Glucose, Bld: 134 mg/dL — ABNORMAL HIGH (ref 70–99)
Sodium: 135 mEq/L (ref 135–145)
Total Protein: 7.5 g/dL (ref 6.0–8.3)

## 2011-07-16 LAB — PROCALCITONIN: Procalcitonin: 3.73 ng/mL

## 2011-07-16 LAB — URINALYSIS, ROUTINE W REFLEX MICROSCOPIC
Glucose, UA: NEGATIVE mg/dL
Leukocytes, UA: NEGATIVE
Nitrite: NEGATIVE
Protein, ur: NEGATIVE mg/dL

## 2011-07-16 LAB — LACTIC ACID, PLASMA: Lactic Acid, Venous: 1 mmol/L (ref 0.5–2.2)

## 2011-07-16 LAB — DIFFERENTIAL
Basophils Absolute: 0 10*3/uL (ref 0.0–0.1)
Eosinophils Absolute: 0.1 10*3/uL (ref 0.0–0.7)
Eosinophils Relative: 1 % (ref 0–5)

## 2011-07-16 LAB — POCT I-STAT TROPONIN I

## 2011-07-16 LAB — INFLUENZA PANEL BY PCR (TYPE A & B): H1N1 flu by pcr: NOT DETECTED

## 2011-07-16 MED ORDER — METHYLPREDNISOLONE SODIUM SUCC 125 MG IJ SOLR
80.0000 mg | Freq: Four times a day (QID) | INTRAMUSCULAR | Status: DC
  Administered 2011-07-16 – 2011-07-17 (×3): 80 mg via INTRAVENOUS
  Filled 2011-07-16 (×4): qty 2

## 2011-07-16 MED ORDER — ASPIRIN 325 MG PO TABS
325.0000 mg | ORAL_TABLET | Freq: Every day | ORAL | Status: DC
  Administered 2011-07-17 – 2011-07-18 (×2): 325 mg via ORAL
  Filled 2011-07-16 (×2): qty 1

## 2011-07-16 MED ORDER — GALANTAMINE HYDROBROMIDE ER 24 MG PO CP24
24.0000 mg | ORAL_CAPSULE | Freq: Every day | ORAL | Status: DC
  Administered 2011-07-18: 24 mg via ORAL
  Filled 2011-07-16 (×2): qty 1

## 2011-07-16 MED ORDER — ONDANSETRON HCL 4 MG/2ML IJ SOLN
4.0000 mg | Freq: Four times a day (QID) | INTRAMUSCULAR | Status: DC | PRN
  Administered 2011-07-16: 4 mg via INTRAVENOUS
  Filled 2011-07-16: qty 2

## 2011-07-16 MED ORDER — ADULT MULTIVITAMIN W/MINERALS CH
1.0000 | ORAL_TABLET | Freq: Every day | ORAL | Status: DC
  Administered 2011-07-17 – 2011-07-18 (×2): 1 via ORAL
  Filled 2011-07-16 (×2): qty 1

## 2011-07-16 MED ORDER — MIRTAZAPINE 30 MG PO TABS
15.0000 mg | ORAL_TABLET | Freq: Every day | ORAL | Status: DC
  Administered 2011-07-16 – 2011-07-17 (×2): 15 mg via ORAL
  Filled 2011-07-16 (×2): qty 1

## 2011-07-16 MED ORDER — PIPERACILLIN-TAZOBACTAM 3.375 G IVPB
INTRAVENOUS | Status: AC
  Filled 2011-07-16: qty 100

## 2011-07-16 MED ORDER — SODIUM BICARBONATE 650 MG PO TABS
325.0000 mg | ORAL_TABLET | Freq: Two times a day (BID) | ORAL | Status: DC
  Administered 2011-07-16 – 2011-07-18 (×4): 325 mg via ORAL
  Filled 2011-07-16: qty 1
  Filled 2011-07-16: qty 2
  Filled 2011-07-16 (×2): qty 1

## 2011-07-16 MED ORDER — GABAPENTIN 400 MG PO CAPS
400.0000 mg | ORAL_CAPSULE | Freq: Three times a day (TID) | ORAL | Status: DC
  Administered 2011-07-16 – 2011-07-18 (×5): 400 mg via ORAL
  Filled 2011-07-16 (×5): qty 1

## 2011-07-16 MED ORDER — SODIUM CHLORIDE 0.9 % IV BOLUS (SEPSIS)
500.0000 mL | Freq: Once | INTRAVENOUS | Status: AC
  Administered 2011-07-16: 500 mL via INTRAVENOUS

## 2011-07-16 MED ORDER — NITROGLYCERIN 0.4 MG SL SUBL: 0.4000 mg | SUBLINGUAL_TABLET | SUBLINGUAL | Status: DC | PRN

## 2011-07-16 MED ORDER — ALBUTEROL SULFATE (5 MG/ML) 0.5% IN NEBU
2.5000 mg | INHALATION_SOLUTION | Freq: Four times a day (QID) | RESPIRATORY_TRACT | Status: DC
  Administered 2011-07-16 – 2011-07-18 (×6): 2.5 mg via RESPIRATORY_TRACT
  Filled 2011-07-16 (×6): qty 0.5

## 2011-07-16 MED ORDER — FINASTERIDE 5 MG PO TABS
5.0000 mg | ORAL_TABLET | Freq: Every day | ORAL | Status: DC
  Administered 2011-07-17 – 2011-07-18 (×2): 5 mg via ORAL
  Filled 2011-07-16 (×2): qty 1

## 2011-07-16 MED ORDER — TRAMADOL HCL 50 MG PO TABS: 50.0000 mg | ORAL_TABLET | Freq: Three times a day (TID) | ORAL | Status: DC | PRN

## 2011-07-16 MED ORDER — ALPRAZOLAM 0.5 MG PO TABS: 0.5000 mg | ORAL_TABLET | Freq: Every evening | ORAL | Status: DC | PRN

## 2011-07-16 MED ORDER — VANCOMYCIN HCL IN DEXTROSE 1-5 GM/200ML-% IV SOLN
1000.0000 mg | Freq: Once | INTRAVENOUS | Status: AC
  Administered 2011-07-16: 1000 mg via INTRAVENOUS
  Filled 2011-07-16: qty 200

## 2011-07-16 MED ORDER — OMEGA-3-ACID ETHYL ESTERS 1 G PO CAPS
1.0000 g | ORAL_CAPSULE | Freq: Every day | ORAL | Status: DC
  Administered 2011-07-16 – 2011-07-17 (×2): 1 g via ORAL
  Filled 2011-07-16 (×2): qty 1

## 2011-07-16 MED ORDER — ACETAMINOPHEN 500 MG PO TABS: 1000.0000 mg | ORAL_TABLET | Freq: Once | ORAL | Status: DC

## 2011-07-16 MED ORDER — ACETAMINOPHEN 325 MG PO TABS: 650.0000 mg | ORAL_TABLET | Freq: Four times a day (QID) | ORAL | Status: DC | PRN

## 2011-07-16 MED ORDER — ACETAMINOPHEN 325 MG PO TABS
650.0000 mg | ORAL_TABLET | Freq: Once | ORAL | Status: AC
Start: 2011-07-16 — End: 2011-07-16
  Administered 2011-07-16: 650 mg via ORAL
  Filled 2011-07-16: qty 2

## 2011-07-16 MED ORDER — ONDANSETRON HCL 4 MG/2ML IJ SOLN
4.0000 mg | Freq: Once | INTRAMUSCULAR | Status: AC
  Administered 2011-07-16: 4 mg via INTRAVENOUS
  Filled 2011-07-16: qty 2

## 2011-07-16 MED ORDER — BUDESONIDE-FORMOTEROL FUMARATE 160-4.5 MCG/ACT IN AERO
2.0000 | INHALATION_SPRAY | Freq: Two times a day (BID) | RESPIRATORY_TRACT | Status: DC
  Administered 2011-07-16 – 2011-07-18 (×4): 2 via RESPIRATORY_TRACT
  Filled 2011-07-16: qty 6

## 2011-07-16 MED ORDER — SODIUM CHLORIDE 0.9 % IV BOLUS (SEPSIS): 500.0000 mL | Freq: Once | INTRAVENOUS | Status: DC

## 2011-07-16 MED ORDER — SODIUM CHLORIDE 0.9 % IV SOLN
INTRAVENOUS | Status: DC
  Administered 2011-07-16: 20:00:00 via INTRAVENOUS

## 2011-07-16 MED ORDER — VANCOMYCIN HCL IN DEXTROSE 1-5 GM/200ML-% IV SOLN
1000.0000 mg | INTRAVENOUS | Status: DC
  Administered 2011-07-17: 1000 mg via INTRAVENOUS
  Filled 2011-07-16 (×2): qty 200

## 2011-07-16 MED ORDER — SODIUM POLYSTYRENE SULFONATE 15 GM/60ML PO SUSP
30.0000 g | Freq: Once | ORAL | Status: AC
  Administered 2011-07-16: 30 g via ORAL
  Filled 2011-07-16: qty 120

## 2011-07-16 MED ORDER — LUBIPROSTONE 24 MCG PO CAPS
24.0000 ug | ORAL_CAPSULE | Freq: Two times a day (BID) | ORAL | Status: DC
  Administered 2011-07-17 – 2011-07-18 (×3): 24 ug via ORAL
  Filled 2011-07-16 (×3): qty 1

## 2011-07-16 MED ORDER — PIPERACILLIN-TAZOBACTAM 3.375 G IVPB
3.3750 g | Freq: Three times a day (TID) | INTRAVENOUS | Status: DC
  Administered 2011-07-16 – 2011-07-18 (×5): 3.375 g via INTRAVENOUS
  Filled 2011-07-16 (×9): qty 50

## 2011-07-16 MED ORDER — BUDESONIDE-FORMOTEROL FUMARATE 160-4.5 MCG/ACT IN AERO
INHALATION_SPRAY | RESPIRATORY_TRACT | Status: AC
  Filled 2011-07-16: qty 6

## 2011-07-16 MED ORDER — PIPERACILLIN-TAZOBACTAM 3.375 G IVPB
3.3750 g | Freq: Once | INTRAVENOUS | Status: AC
  Administered 2011-07-16: 3.375 g via INTRAVENOUS
  Filled 2011-07-16: qty 50

## 2011-07-16 MED ORDER — PANTOPRAZOLE SODIUM 40 MG PO TBEC
40.0000 mg | DELAYED_RELEASE_TABLET | Freq: Every day | ORAL | Status: DC
  Administered 2011-07-16 – 2011-07-17 (×2): 40 mg via ORAL
  Filled 2011-07-16 (×2): qty 1

## 2011-07-16 MED ORDER — TAMSULOSIN HCL 0.4 MG PO CAPS
0.4000 mg | ORAL_CAPSULE | Freq: Every day | ORAL | Status: DC
  Administered 2011-07-16 – 2011-07-18 (×3): 0.4 mg via ORAL
  Filled 2011-07-16 (×3): qty 1

## 2011-07-16 MED ORDER — CYANOCOBALAMIN 250 MCG PO TABS
250.0000 ug | ORAL_TABLET | Freq: Every day | ORAL | Status: DC
  Filled 2011-07-16 (×2): qty 1

## 2011-07-16 MED ORDER — ENOXAPARIN SODIUM 30 MG/0.3ML ~~LOC~~ SOLN
30.0000 mg | SUBCUTANEOUS | Status: DC
  Administered 2011-07-16 – 2011-07-17 (×2): 30 mg via SUBCUTANEOUS
  Filled 2011-07-16 (×2): qty 0.3

## 2011-07-16 NOTE — ED Notes (Signed)
Pt given urinal with inst to obtain to obtain spec

## 2011-07-16 NOTE — Progress Notes (Signed)
ANTIBIOTIC CONSULT NOTE - INITIAL  Pharmacy Consult for  Vacomycin and Zosyn Indication: rule out pneumonia  No Known Allergies  Patient Measurements: Height: 5\' 7"  (170.2 cm) Weight: 171 lb (77.565 kg) IBW/kg (Calculated) : 66.1  Adjusted Body Weight: N/A  Vital Signs: Temp: 100.6 F (38.1 C) (12/24 1946) Temp src: Oral (12/24 1946) BP: 92/74 mmHg (12/24 2000) Pulse Rate: 86  (12/24 2000) Intake/Output from previous day:   Intake/Output from this shift: Total I/O In: 30.8 [I.V.:30.8] Out: -   Labs:  Basename 07/16/11 1009  WBC 9.7  HGB 12.6*  PLT 129*  LABCREA --  CREATININE 2.33*   Estimated Creatinine Clearance: 24.4 ml/min (by C-G formula based on Cr of 2.33).    Microbiology: No results found for this or any previous visit (from the past 720 hour(s)).  Medical History: Past Medical History  Diagnosis Date  . Renal insufficiency   . PVD (peripheral vascular disease)   . Hypertension   . Hyperlipemia   . GERD (gastroesophageal reflux disease)   . CAD (coronary artery disease)   . COPD (chronic obstructive pulmonary disease)   . Monoclonal gammopathy      Medications:   Reviewed  Assessment: Empiric therapy  Goal of Therapy:  Vancomycin trough level 15-20 mcg/ml  Plan:  Vancomycin 1000 mg IV every 24 hours. Zosyn 3.375 grams IV every 8 hours. Check trough at steady state.   Tomi Bamberger J 07/16/2011,8:28 PM

## 2011-07-16 NOTE — ED Notes (Signed)
Pt c/o cough, congestion, fever, chills and nausea since this Saturday.

## 2011-07-16 NOTE — ED Provider Notes (Signed)
History     CSN: [id number]  Arrival date & time 07/16/11  4098   First MD Initiated Contact with Patient 07/16/11 1006      Chief Complaint  Patient presents with  . Fever    (Consider location/radiation/quality/duration/timing/severity/associated sxs/prior treatment) Patient is a 77 y.o. male presenting with flu symptoms. The history is provided by the patient and the spouse.  Influenza This is a new problem. Episode onset: 2 days ago. The problem occurs constantly. The problem has been gradually worsening. Associated symptoms include anorexia, congestion, coughing, fatigue, a fever, myalgias, nausea and weakness. Pertinent negatives include no abdominal pain, arthralgias, chest pain, headaches, joint swelling, neck pain, numbness, rash, sore throat or vomiting. Exacerbated by: He becomes weak with attempt to ambulate. Treatments tried: aspirin,  with temporary relief of fever.  Fever has been to 102. at home. The treatment provided mild relief.    Past Medical History  Diagnosis Date  . Renal insufficiency   . PVD (peripheral vascular disease)   . Hypertension   . Hyperlipemia   . GERD (gastroesophageal reflux disease)   . CAD (coronary artery disease)   . COPD (chronic obstructive pulmonary disease)   . Monoclonal gammopathy     Past Surgical History  Procedure Date  . Coronary artery bypass graft 1997  . Left cea 2002  . Back surgery 2011    No family history on file.  History  Substance Use Topics  . Smoking status: Former Smoker -- 0.5 packs/day for 40 years    Types: Cigarettes    Quit date: 07/23/1978  . Smokeless tobacco: Never Used  . Alcohol Use: No      Review of Systems  Constitutional: Positive for fever and fatigue.  HENT: Positive for congestion. Negative for sore throat and neck pain.   Eyes: Negative.   Respiratory: Positive for cough. Negative for chest tightness and shortness of breath.   Cardiovascular: Negative for chest pain.    Gastrointestinal: Positive for nausea and anorexia. Negative for vomiting and abdominal pain.  Genitourinary: Negative.   Musculoskeletal: Positive for myalgias. Negative for joint swelling and arthralgias.  Skin: Negative.  Negative for rash and wound.  Neurological: Positive for weakness. Negative for dizziness, light-headedness, numbness and headaches.  Hematological: Negative.   Psychiatric/Behavioral: Negative.     Allergies  Review of patient's allergies indicates no known allergies.  Home Medications   Current Outpatient Rx  Name Route Sig Dispense Refill  . ALPRAZOLAM 0.5 MG PO TABS Oral Take 0.5 mg by mouth as needed. anxiety    . ASPIRIN 325 MG PO TABS Oral Take 325 mg by mouth daily.      . BUDESONIDE-FORMOTEROL FUMARATE 160-4.5 MCG/ACT IN AERO Inhalation Inhale 2 puffs into the lungs 2 (two) times daily. 1 Inhaler 6  . FINASTERIDE 5 MG PO TABS Oral Take 5 mg by mouth daily.      . OMEGA-3 FATTY ACIDS 1000 MG PO CAPS Oral Take 1 g by mouth daily.      Marland Kitchen GABAPENTIN 400 MG PO CAPS Oral Take 400 mg by mouth 3 (three) times daily.      Marland Kitchen GALANTAMINE HYDROBROMIDE ER 24 MG PO CP24 Oral Take 24 mg by mouth daily with breakfast.      . LUBIPROSTONE 24 MCG PO CAPS Oral Take 24 mcg by mouth 2 (two) times daily with a meal.      . MIRTAZAPINE 15 MG PO TABS Oral Take 1 tablet by mouth at bedtime.    Marland Kitchen  ONE-DAILY MULTI VITAMINS PO TABS Oral Take 1 tablet by mouth daily.      Marland Kitchen OMEPRAZOLE 20 MG PO CPDR Oral Take 20 mg by mouth daily.      Marland Kitchen PROMETHAZINE HCL 25 MG PO TABS  1-2 tablets by mouth every 6 hours as needed for nausea    . SODIUM BICARBONATE 325 MG PO TABS Oral Take 325 mg by mouth 2 (two) times daily.      Marland Kitchen TAMSULOSIN HCL 0.4 MG PO CAPS Oral Take 0.4 mg by mouth daily.      . TRAMADOL HCL 50 MG PO TABS Oral Take 50 mg by mouth 3 (three) times daily as needed.      Marland Kitchen VITAMIN B-12 250 MCG PO TABS Oral Take 250 mcg by mouth daily.      Marland Kitchen NITROGLYCERIN 0.4 MG SL SUBL Sublingual  Place 0.4 mg under the tongue every 5 (five) minutes as needed. Chest pain      BP 97/49  Pulse 93  Temp(Src) 103.1 F (39.5 C) (Oral)  Resp 18  Ht 5\' 7"  (1.702 m)  Wt 170 lb (77.111 kg)  BMI 26.63 kg/m2  SpO2 98%  Physical Exam  Nursing note and vitals reviewed. Constitutional: He is oriented to person, place, and time. He appears well-developed and well-nourished. No distress.       No distress,  Borderline tachy  HENT:  Head: Normocephalic and atraumatic.  Eyes: Conjunctivae are normal.  Neck: Normal range of motion.  Cardiovascular: Normal rate, regular rhythm, normal heart sounds and intact distal pulses.   Pulmonary/Chest: Effort normal and breath sounds normal. He has no wheezes.  Abdominal: Soft. Bowel sounds are normal. There is no tenderness.  Musculoskeletal: Normal range of motion.  Neurological: He is alert and oriented to person, place, and time.  Skin: Skin is warm and dry.  Psychiatric: He has a normal mood and affect.    ED Course  Procedures (including critical care time)  Labs Reviewed  CBC - Abnormal; Notable for the following:    RBC 3.78 (*)    Hemoglobin 12.6 (*)    HCT 37.4 (*)    Platelets 129 (*)    All other components within normal limits  DIFFERENTIAL - Abnormal; Notable for the following:    Neutrophils Relative 90 (*)    Neutro Abs 8.7 (*)    Lymphocytes Relative 5 (*)    Lymphs Abs 0.5 (*)    All other components within normal limits  COMPREHENSIVE METABOLIC PANEL - Abnormal; Notable for the following:    Potassium 5.6 (*)    Glucose, Bld 134 (*)    Creatinine, Ser 2.33 (*)    Albumin 3.3 (*)    GFR calc non Af Amer 25 (*)    GFR calc Af Amer 29 (*)    All other components within normal limits  URINALYSIS, ROUTINE W REFLEX MICROSCOPIC - Abnormal; Notable for the following:    Specific Gravity, Urine >1.030 (*)    All other components within normal limits  POCT I-STAT TROPONIN I  I-STAT TROPONIN I  PROCALCITONIN  CULTURE,  BLOOD (ROUTINE X 2)  CULTURE, BLOOD (ROUTINE X 2)  LACTIC ACID, PLASMA   Dg Chest 2 View  07/16/2011  *RADIOLOGY REPORT*  Clinical Data: Cough, weakness.  CHEST - 2 VIEW  Comparison: 05/09/2010  Findings: Severe chronic changes in the lungs.  There is a scarring bilaterally, right greater than left.  Prior CABG.  Heart is normal size.  No  effusions or acute bony abnormality.  IMPRESSION: Severe chronic lung disease.  No active disease.  Original Report Authenticated By: Cyndie Chime, M.D.     No diagnosis found.  Gentle IV fluid 500 cc bolus given.  Patient spiked fever to 103.1,  bp decreased to 97/49.  Additional IV fluids given.  No bacterial source found for fever.  Suspect viral source,  Possibly influenza. Patient not stable for dc home.  Will call for admission .  MDM    Date: 07/16/2011  Rate: 95  Rhythm: normal sinus rhythm  QRS Axis: normal  Intervals: normal  ST/T Wave abnormalities: normal  Conduction Disutrbances:none  Narrative Interpretation:   Old EKG Reviewed: none available       Spoke with Dr Eda Paschal with [facility] - will admit pt to step down bed.  Candis Musa, PA 07/16/11 1635

## 2011-07-16 NOTE — ED Provider Notes (Signed)
Medical screening examination/treatment/procedure(s) were conducted as a shared visit with non-physician practitioner(s) and myself.  I personally evaluated the patient during the encounter  Patient seen by me. As you may have viral influenza or perhaps a pre-bacterial sepsis. Orthostatic hypotension in the emergency department, in addition blood pressure of borderline in the 90s. She mentally is alert and in no acute distress. Given IV fluids without much improvement in the systolic blood pressure however not hypotensive. Blood cultures done. IV Zosyn and vancomycin started. Septic parameters order. We'll require admission and observation. She currently is not septic but may be in a pre-septic state.  CRITICAL CARE Performed by: Shelda Jakes.   Total critical care time: 30  Critical care time was exclusive of separately billable procedures and treating other patients.  Critical care was necessary to treat or prevent imminent or life-threatening deterioration.  Critical care was time spent personally by me on the following activities: development of treatment plan with patient and/or surrogate as well as nursing, discussions with consultants, evaluation of patient's response to treatment, examination of patient, obtaining history from patient or surrogate, ordering and performing treatments and interventions, ordering and review of laboratory studies, ordering and review of radiographic studies, pulse oximetry and re-evaluation of patient's condition.     Shelda Jakes, MD 07/16/11 1535

## 2011-07-16 NOTE — H&P (Signed)
PCP:   Sissy Hoff, MD   Chief Complaint:  Fever and shaking for 2 days  HPI: This is the a 77 year old male with a history of COPD not on home oxygen, chronic kidney disease stage III, early stage of dementia, he has a history of chronic nausea for which he take antiemetic medication, but for the last week the nausea getting worse, condition associated with loss of appetite, for 4 the last few days the patient continued to complain of feeling weak, and yesterday the wife noticed he had fever 103 with shaking and chills and sweating, condition associated with cough and shortness of breath, cough associated with yellowish sputum, he denies any abdominal pain his last bowel movement was early morning today, in the emergency room he found to have a temperature of 103, was negative chest x-ray, but suddenly he became hypotensive is status post 500 cc bolus and accordingly hospitalist service was called to admit the patient  Review of Systems:  He has chronic and nausea, he has one episode of vomiting 2 days ago, denies any melena and denies any hemoptysis or hematemesis, wife admitted he has about 6 bowel weight loss within the last month, he denies any chest pain or shortness of breath or palpitation, he denies any weakness or numbness of his extremities although he had chronic bilateral feet numbness, he denies any burning micturition, he denies any orthopnea or paroxysmal nocturnal dyspnea, he denies any lower extremity swelling Past Medical History: Past Medical History  Diagnosis Date  . Renal insufficiency   . PVD (peripheral vascular disease)   . Hypertension   . Hyperlipemia   . GERD (gastroesophageal reflux disease)   . CAD (coronary artery disease)   . COPD (chronic obstructive pulmonary disease)   . Monoclonal gammopathy    Past Surgical History  Procedure Date  . Coronary artery bypass graft 1997  . Left cea 2002  . Back surgery 2011    Medications: Prior to Admission  medications   Medication Sig Start Date End Date Taking? Authorizing Provider  ALPRAZolam Prudy Feeler) 0.5 MG tablet Take 0.5 mg by mouth as needed. anxiety   Yes Historical Provider, MD  aspirin 325 MG tablet Take 325 mg by mouth daily.     Yes Historical Provider, MD  budesonide-formoterol (SYMBICORT) 160-4.5 MCG/ACT inhaler Inhale 2 puffs into the lungs 2 (two) times daily. 02/21/11  Yes Shan Levans, MD  finasteride (PROSCAR) 5 MG tablet Take 5 mg by mouth daily.     Yes Historical Provider, MD  fish oil-omega-3 fatty acids 1000 MG capsule Take 1 g by mouth daily.     Yes Historical Provider, MD  gabapentin (NEURONTIN) 400 MG capsule Take 400 mg by mouth 3 (three) times daily.     Yes Historical Provider, MD  galantamine (RAZADYNE ER) 24 MG 24 hr capsule Take 24 mg by mouth daily with breakfast.     Yes Historical Provider, MD  lubiprostone (AMITIZA) 24 MCG capsule Take 24 mcg by mouth 2 (two) times daily with a meal.     Yes Historical Provider, MD  mirtazapine (REMERON) 15 MG tablet Take 1 tablet by mouth at bedtime.   Yes Historical Provider, MD  Multiple Vitamin (MULTIVITAMIN) tablet Take 1 tablet by mouth daily.     Yes Historical Provider, MD  omeprazole (PRILOSEC) 20 MG capsule Take 20 mg by mouth daily.     Yes Historical Provider, MD  promethazine (PHENERGAN) 25 MG tablet 1-2 tablets by mouth every 6 hours as  needed for nausea   Yes Historical Provider, MD  sodium bicarbonate 325 MG tablet Take 325 mg by mouth 2 (two) times daily.     Yes Historical Provider, MD  Tamsulosin HCl (FLOMAX) 0.4 MG CAPS Take 0.4 mg by mouth daily.     Yes Historical Provider, MD  traMADol (ULTRAM) 50 MG tablet Take 50 mg by mouth 3 (three) times daily as needed.     Yes Historical Provider, MD  vitamin B-12 (CYANOCOBALAMIN) 250 MCG tablet Take 250 mcg by mouth daily.     Yes Historical Provider, MD  nitroGLYCERIN (NITROSTAT) 0.4 MG SL tablet Place 0.4 mg under the tongue every 5 (five) minutes as needed. Chest  pain    Historical Provider, MD    Allergies:  No Known Allergies  Social History:  reports that he quit smoking about 33 years ago. His smoking use included Cigarettes. He has a 20 pack-year smoking history. He has never used smokeless tobacco. He reports that he does not drink alcohol or use illicit drugs.  Family History: Mother is still alive at 69 Physical Exam: Filed Vitals:   07/16/11 1410 07/16/11 1412 07/16/11 1419 07/16/11 1523  BP: 89/51  97/41 97/49  Pulse: 92   93  Temp:  103 F (39.4 C)  103.1 F (39.5 C)  TempSrc:  Oral  Oral  Resp:      Height:      Weight:      SpO2:    98%   He is sitting on the commode, he is looking weak but alert, his not confused pupil equally reactive to light and accommodation his not pale or jaundiced, neck supple with no lymph nodes Heart S1 and S2 with no added sounds, lung examination normal vesicular breathing with equal air entry at could not appreciate any wheezing or rales, abdomen distended soft nontender bowel sounds present extremities without edema and peripheral pulses intact CNS patient awake and fully oriented no focal neurological deficit   Labs on Admission:   Basename 07/16/11 1009  NA 135  K 5.6*  CL 107  CO2 20  GLUCOSE 134*  BUN 22  CREATININE 2.33*  CALCIUM 9.4  MG --  PHOS --    Basename 07/16/11 1009  AST 20  ALT 16  ALKPHOS 61  BILITOT 0.3  PROT 7.5  ALBUMIN 3.3*   No results found for this basename: LIPASE:2,AMYLASE:2 in the last 72 hours  Basename 07/16/11 1009  WBC 9.7  NEUTROABS 8.7*  HGB 12.6*  HCT 37.4*  MCV 98.9  PLT 129*   No results found for this basename: CKTOTAL:3,CKMB:3,CKMBINDEX:3,TROPONINI:3 in the last 72 hours No results found for this basename: TSH,T4TOTAL,FREET3,T3FREE,THYROIDAB in the last 72 hours No results found for this basename: VITAMINB12:2,FOLATE:2,FERRITIN:2,TIBC:2,IRON:2,RETICCTPCT:2 in the last 72 hours  Radiological Exams on Admission: Dg Chest 2  View  07/16/2011  *RADIOLOGY REPORT*  Clinical Data: Cough, weakness.  CHEST - 2 VIEW  Comparison: 05/09/2010  Findings: Severe chronic changes in the lungs.  There is a scarring bilaterally, right greater than left.  Prior CABG.  Heart is normal size.  No effusions or acute bony abnormality.  IMPRESSION: Severe chronic lung disease.  No active disease.  Original Report Authenticated By: Cyndie Chime, M.D.    Assessment/Plan 1-fever with hypotension and mildly elevated procalcitonin: Soft could be early sepsis source could be virus versus bacterial bronchitis, with treat currently as a case of pneumonia, but would broaden spectrum to vancomycin and Zosyn secondary to hypotension ,  also would add Zithromax IV,, would get random cortisol level and TSH., Would add IV fluid 50 cc per hour,, and monitor input and output, would like to observe the patient was sent down for 24-hour would also check a the flu pannel 2-hypotension would hold his blood pressure medication, and as the patient has a history of COPD I would start nebs treatment and Solu-Medrol and we'll likely taper soon 3-coronary artery disease and status post bypass with check cardiac enzyme, EKG normal sinus rhythm 4-chronic nausea would provide nausea medicine I will start the patient on diet his abdomen seems soft 4-hyperkalemia will provide Kayexalate, and would've BMET currently there is no EKG changes Bethanne Mule I. 07/16/2011, 5:46 PM

## 2011-07-16 NOTE — ED Notes (Signed)
Pt also c/o coughing up yellow phlegm.

## 2011-07-16 NOTE — ED Notes (Signed)
hospitalist in with pt

## 2011-07-16 NOTE — ED Notes (Signed)
Pt complain of stomach feeling a little upset. Pt med with zofran

## 2011-07-17 ENCOUNTER — Inpatient Hospital Stay (HOSPITAL_COMMUNITY): Payer: Medicare Other

## 2011-07-17 LAB — COMPREHENSIVE METABOLIC PANEL
ALT: 13 U/L (ref 0–53)
Alkaline Phosphatase: 54 U/L (ref 39–117)
BUN: 29 mg/dL — ABNORMAL HIGH (ref 6–23)
CO2: 16 mEq/L — ABNORMAL LOW (ref 19–32)
Chloride: 106 mEq/L (ref 96–112)
GFR calc Af Amer: 27 mL/min — ABNORMAL LOW (ref >90)
GFR calc non Af Amer: 23 mL/min — ABNORMAL LOW (ref >90)
Glucose, Bld: 147 mg/dL — ABNORMAL HIGH (ref 70–99)
Potassium: 5.4 mEq/L — ABNORMAL HIGH (ref 3.5–5.1)
Sodium: 134 mEq/L — ABNORMAL LOW (ref 135–145)
Total Bilirubin: 0.6 mg/dL (ref 0.3–1.2)

## 2011-07-17 LAB — TSH: TSH: 0.277 u[IU]/mL — ABNORMAL LOW (ref 0.350–4.500)

## 2011-07-17 LAB — CARDIAC PANEL(CRET KIN+CKTOT+MB+TROPI)
CK, MB: 2.7 ng/mL (ref 0.3–4.0)
CK, MB: 3 ng/mL (ref 0.3–4.0)
Total CK: 155 U/L (ref 7–232)
Total CK: 156 U/L (ref 7–232)
Troponin I: <0.3 ng/mL (ref <0.30)
Troponin I: <0.3 ng/mL (ref <0.30)

## 2011-07-17 LAB — CBC
HCT: 34.9 % — ABNORMAL LOW (ref 39.0–52.0)
MCHC: 32.7 g/dL (ref 30.0–36.0)
Platelets: 136 10*3/uL — ABNORMAL LOW (ref 150–400)
RDW: 14.2 % (ref 11.5–15.5)
WBC: 11.9 10*3/uL — ABNORMAL HIGH (ref 4.0–10.5)

## 2011-07-17 LAB — CORTISOL-AM, BLOOD: Cortisol - AM: 30.4 ug/dL — ABNORMAL HIGH (ref 4.3–22.4)

## 2011-07-17 MED ORDER — DRONABINOL 2.5 MG PO CAPS
2.5000 mg | ORAL_CAPSULE | Freq: Every day | ORAL | Status: DC
  Administered 2011-07-17: 2.5 mg via ORAL
  Filled 2011-07-17: qty 1

## 2011-07-17 MED ORDER — OSELTAMIVIR PHOSPHATE 75 MG PO CAPS
75.0000 mg | ORAL_CAPSULE | Freq: Two times a day (BID) | ORAL | Status: DC
  Administered 2011-07-17 – 2011-07-18 (×3): 75 mg via ORAL
  Filled 2011-07-17 (×3): qty 1

## 2011-07-17 MED ORDER — CYANOCOBALAMIN 250 MCG PO TABS
250.0000 ug | ORAL_TABLET | Freq: Every day | ORAL | Status: DC
  Administered 2011-07-17 – 2011-07-18 (×2): 250 ug via ORAL
  Filled 2011-07-17 (×2): qty 3

## 2011-07-17 NOTE — Progress Notes (Signed)
Subjective: He is feeling much better, he denies any chest pain, history feel some shortness of breath, he has a history of chronic nausea and vomiting and a decrease of appetite, and he would like to go home phone  Objective: Vital signs in last 24 hours: Filed Vitals:   07/17/11 0714 07/17/11 0800 07/17/11 0900 07/17/11 1000  BP:  124/66  112/58  Pulse:  103  86  Temp:  97.9 F (36.6 C)    TempSrc:  Oral    Resp:  22 21 26   Height:      Weight:      SpO2: 100% 98%  100%   Weight change:   Intake/Output Summary (Last 24 hours) at 07/17/11 1024 Last data filed at 07/17/11 1000  Gross per 24 hour  Intake 1965.33 ml  Output    400 ml  Net 1565.33 ml    Patient sitting on chair, he is not on respiratory distress. Pupil equally reactive to light and accommodation and extraocular muscle movement within normal Neck supple with no lymph nodes, no thyromegaly and no masses Lung normal vesicular breathing with equal air entry Abdomen soft nontender bowel sounds present Extremities without edema and peripheral pulses intact CNS his awake fully oriented  Lab Results:  Basename 07/17/11 0418 07/16/11 1009  NA 134* 135  K 5.4* 5.6*  CL 106 107  CO2 16* 20  GLUCOSE 147* 134*  BUN 29* 22  CREATININE 2.52* 2.33*  CALCIUM 8.7 9.4  MG -- --  PHOS -- --    Basename 07/17/11 0418 07/16/11 1009  AST 19 20  ALT 13 16  ALKPHOS 54 61  BILITOT 0.6 0.3  PROT 7.2 7.5  ALBUMIN 2.8* 3.3*   No results found for this basename: LIPASE:2,AMYLASE:2 in the last 72 hours  Basename 07/17/11 0418 07/16/11 1009  WBC 11.9* 9.7  NEUTROABS -- 8.7*  HGB 11.4* 12.6*  HCT 34.9* 37.4*  MCV 100.6* 98.9  PLT 136* 129*    Basename 07/17/11 0419 07/17/11 0003  CKTOTAL 155 156  CKMB 3.0 2.7  CKMBINDEX -- --  TROPONINI <0.30 <0.30   No components found with this basename: POCBNP:3 No results found for this basename: DDIMER:2 in the last 72 hours No results found for this basename: HGBA1C:2 in  the last 72 hours No results found for this basename: CHOL:2,HDL:2,LDLCALC:2,TRIG:2,CHOLHDL:2,LDLDIRECT:2 in the last 72 hours No results found for this basename: TSH,T4TOTAL,FREET3,T3FREE,THYROIDAB in the last 72 hours No results found for this basename: VITAMINB12:2,FOLATE:2,FERRITIN:2,TIBC:2,IRON:2,RETICCTPCT:2 in the last 72 hours  Micro Results: Recent Results (from the past 240 hour(s))  CULTURE, BLOOD (ROUTINE X 2)     Status: Normal (Preliminary result)   Collection Time   07/16/11  2:56 PM      Component Value Range Status Comment   Specimen Description BLOOD RIGHT ANTECUBITAL   Final    Special Requests BOTTLES DRAWN AEROBIC AND ANAEROBIC 10CC   Final    Culture     Final    Value: GRAM POSITIVE COCCI     Gram Stain Report Called to,Read Back By and Verified With: CHILDRESS RN J ON [id number] AT 0915 BY RESSEGGER R   Report Status PENDING   Incomplete   CULTURE, BLOOD (ROUTINE X 2)     Status: Normal (Preliminary result)   Collection Time   07/16/11  3:10 PM      Component Value Range Status Comment   Specimen Description BLOOD RIGHT HAND   Final    Special Requests BOTTLES DRAWN  AEROBIC AND ANAEROBIC 7CC   Final    Culture     Final    Value: GRAM POSITIVE COCCI     Gram Stain Report Called to,Read Back By and Verified With: CHILDRESS J  RN ON [phone] AT 0915 BY RESSEGGER R   Report Status PENDING   Incomplete   MRSA PCR SCREENING     Status: Normal   Collection Time   07/16/11  7:40 PM      Component Value Range Status Comment   MRSA by PCR NEGATIVE  NEGATIVE  Final     Studies/Results: Dg Chest 2 View  07/16/2011  *RADIOLOGY REPORT*  Clinical Data: Cough, weakness.  CHEST - 2 VIEW  Comparison: 05/09/2010  Findings: Severe chronic changes in the lungs.  There is a scarring bilaterally, right greater than left.  Prior CABG.  Heart is normal size.  No effusions or acute bony abnormality.  IMPRESSION: Severe chronic lung disease.  No active disease.  Original Report  Authenticated By: Cyndie Chime, M.D.    Medications: I have reviewed the patient's current medications. Scheduled Meds:   . acetaminophen  650 mg Oral Once  . albuterol  2.5 mg Nebulization Q6H  . aspirin  325 mg Oral Daily  . budesonide-formoterol  2 puff Inhalation BID  . dronabinol  2.5 mg Oral QAC lunch  . enoxaparin  30 mg Subcutaneous Q24H  . finasteride  5 mg Oral Daily  . gabapentin  400 mg Oral TID  . galantamine  24 mg Oral Q breakfast  . lubiprostone  24 mcg Oral BID WC  . mirtazapine  15 mg Oral QHS  . mulitivitamin with minerals  1 tablet Oral Daily  . omega-3 acid ethyl esters  1 g Oral Daily  . ondansetron  4 mg Intravenous Once  . oseltamivir  75 mg Oral BID  . pantoprazole  40 mg Oral Q1200  . piperacillin-tazobactam  3.375 g Intravenous Once  . piperacillin-tazobactam  3.375 g Intravenous Q8H  . sodium bicarbonate  325 mg Oral BID  . sodium chloride  500 mL Intravenous Once  . sodium chloride  500 mL Intravenous Once  . sodium polystyrene  30 g Oral Once  . Tamsulosin HCl  0.4 mg Oral Daily  . vancomycin  1,000 mg Intravenous Once  . vancomycin  1,000 mg Intravenous Q24H  . vitamin B-12  250 mcg Oral Daily  . DISCONTD: acetaminophen  1,000 mg Oral Once  . DISCONTD: methylPREDNISolone (SOLU-MEDROL) injection  80 mg Intravenous Q6H  . DISCONTD: sodium chloride  500 mL Intravenous Once  . DISCONTD: vitamin B-12  250 mcg Oral Daily   Continuous Infusions:   . sodium chloride 50 mL/hr at 07/17/11 1000   PRN Meds:.acetaminophen, ALPRAZolam, nitroGLYCERIN, ondansetron (ZOFRAN) IV, traMADol  Assessment/Plan: 47-77 year old male, admitted with weakness and fever, found to be hypotensive which responded to IV fluid, he found to have influenza A. positive by PCR, would initiate Tamiflu, he also have gram-positive cocci on 2 bottles, I would continue with vancomycin and Zosyn pending final report, he felt much better today, he does not have any wheezing so I was  DC'd Solu-Medrol, I would continue with nebs treatment 2-influenza A PCR positive Tamiflu on droplet precautions 3-gram-positive cocci we'll continue with Zosyn and vancomycin pending final report rule out contamination,, I would repeat chest x-ray examination of the chest looks benign 4-chronic nausea and vomiting with poor appetite I will start the patient marinol and mertazpine 5-chronic renal insufficiency at  baseline followup with Dr.Patel 6-hyperkalemia status post Kayexalate would repeat bmet. Will transfer to tele  LOS: 1 day  Charbel Los I. 07/17/2011, 10:24 AM

## 2011-07-17 NOTE — Progress Notes (Signed)
Report called to RN. Pt to be transferred to room 312 per MD order. Pt transferred via wheelchair. Family members aware.

## 2011-07-18 LAB — BASIC METABOLIC PANEL
BUN: 30 mg/dL — ABNORMAL HIGH (ref 6–23)
CO2: 24 mEq/L (ref 19–32)
Calcium: 9.4 mg/dL (ref 8.4–10.5)
Chloride: 109 mEq/L (ref 96–112)
Creatinine, Ser: 1.91 mg/dL — ABNORMAL HIGH (ref 0.50–1.35)
GFR calc Af Amer: 37 mL/min — ABNORMAL LOW (ref >90)

## 2011-07-18 LAB — CBC
HCT: 32.9 % — ABNORMAL LOW (ref 39.0–52.0)
MCHC: 33.7 g/dL (ref 30.0–36.0)
MCV: 98.8 fL (ref 78.0–100.0)
Platelets: 150 10*3/uL (ref 150–400)
RDW: 14.2 % (ref 11.5–15.5)

## 2011-07-18 MED ORDER — SODIUM CHLORIDE 0.9 % IJ SOLN
INTRAMUSCULAR | Status: AC
  Administered 2011-07-18: 10 mL
  Filled 2011-07-18: qty 3

## 2011-07-18 MED ORDER — MOXIFLOXACIN HCL 400 MG PO TABS: 400.0000 mg | ORAL_TABLET | Freq: Every day | ORAL | Status: DC

## 2011-07-18 MED ORDER — OSELTAMIVIR PHOSPHATE 75 MG PO CAPS: 75.0000 mg | ORAL_CAPSULE | Freq: Two times a day (BID) | ORAL | Status: AC

## 2011-07-18 NOTE — Progress Notes (Signed)
Physical Therapy Evaluation Patient Details Name: [name] [name] MRN: [id number] DOB: 06-23-1931 Today's Date: 07/18/2011  Problem List:  Patient Active Problem List  Diagnoses  . HYPERLIPIDEMIA  . MONOCLONAL GAMMOPATHY  . HYPERTENSION  . CORONARY ARTERY DISEASE  . PERIPHERAL VASCULAR DISEASE  . ALLERGIC RHINITIS  . COPD  . GERD  . RENAL INSUFFICIENCY  . PAIN IN JOINT, MULTIPLE SITES  . CHEST WALL PAIN, ACUTE    Past Medical History:  Past Medical History  Diagnosis Date  . Renal insufficiency   . PVD (peripheral vascular disease)   . Hypertension   . Hyperlipemia   . GERD (gastroesophageal reflux disease)   . CAD (coronary artery disease)   . COPD (chronic obstructive pulmonary disease)   . Monoclonal gammopathy    Past Surgical History:  Past Surgical History  Procedure Date  . Coronary artery bypass graft 1997  . Left cea 2002  . Back surgery 2011    PT Assessment/Plan/Recommendation PT Assessment Clinical Impression Statement: pt feeling much better today and is at prior functional level...no PT needs PT Recommendation/Assessment: Patent does not need any further PT services No Skilled PT: Patient at baseline level of functioning PT Recommendation Follow Up Recommendations: None Equipment Recommended: None recommended by PT PT Goals     PT Evaluation Precautions/Restrictions  Precautions Required Braces or Orthoses: No Restrictions Weight Bearing Restrictions: No Prior Functioning  Home Living Lives With: Spouse Receives Help From: Family Type of Home: Apartment Home Layout: One level Home Access: Level entry Bathroom Shower/Tub: Engineer, manufacturing systems: Standard Home Adaptive Equipment: None Prior Function Level of Independence: Independent with basic ADLs;Independent with gait;Independent with homemaking with ambulation;Independent with transfers Driving: Yes Vocation: Retired Producer, television/film/video:  Awake/alert Overall Cognitive Status: Appears within functional limits for tasks assessed Sensation/Coordination Sensation Light Touch: Appears Intact Stereognosis: Not tested Hot/Cold: Not tested Proprioception: Appears Intact Coordination Gross Motor Movements are Fluid and Coordinated: Yes Fine Motor Movements are Fluid and Coordinated: Yes Extremity Assessment RUE Assessment RUE Assessment: Within Functional Limits LUE Assessment LUE Assessment: Within Functional Limits RLE Assessment RLE Assessment: Within Functional Limits LLE Assessment LLE Assessment: Within Functional Limits Mobility (including Balance) Bed Mobility Bed Mobility: Yes Supine to Sit: 7: Independent Sitting - Scoot to Edge of Bed: 7: Independent Transfers Transfers: Yes Sit to Stand: 7: Independent Stand to Sit: 7: Independent Ambulation/Gait Ambulation/Gait: Yes Ambulation/Gait Assistance: 7: Independent Ambulation Distance (Feet): 175 Feet Assistive device: None Gait Pattern: Within Functional Limits Stairs: No Wheelchair Mobility Wheelchair Mobility: No  Posture/Postural Control Posture/Postural Control: No significant limitations Balance Balance Assessed: No Exercise    End of Session PT - End of Session Equipment Utilized During Treatment: Gait belt Activity Tolerance: Patient tolerated treatment well Patient left: in bed Nurse Communication: Mobility status for transfers;Mobility status for ambulation General Behavior During Session: [facility] for tasks performed Cognition: [facility] for tasks performed  Konrad Penta 07/18/2011, 11:05 AM

## 2011-07-18 NOTE — Discharge Summary (Addendum)
Physician Discharge Summary  Patient ID: [name] [name] MRN: [id number] DOB/AGE: [birth year] 77 y.o. Primary Care Physician:SWAYNE,DAVID W, MD Admit date: 07/16/2011 Discharge date: 07/18/2011    Discharge Diagnoses:  1. Influenza A. 2. streptococcal pneumonia bacteremia, sensitive to Avelox.  3. Chronic kidney disease.   Current Discharge Medication List    START taking these medications   Details  oseltamivir (TAMIFLU) 75 MG capsule Take 1 capsule (75 mg total) by mouth 2 (two) times daily. Qty: 10 capsule, Refills: 0      CONTINUE these medications which have CHANGED   Details  moxifloxacin (AVELOX) 400 MG tablet Take 1 tablet (400 mg total) by mouth daily. Qty: 5 tablet, Refills: 0      CONTINUE these medications which have NOT CHANGED   Details  ALPRAZolam (XANAX) 0.5 MG tablet Take 0.5 mg by mouth as needed. anxiety    aspirin 325 MG tablet Take 325 mg by mouth daily.      budesonide-formoterol (SYMBICORT) 160-4.5 MCG/ACT inhaler Inhale 2 puffs into the lungs 2 (two) times daily. Qty: 1 Inhaler, Refills: 6   Associated Diagnoses: Asthma    finasteride (PROSCAR) 5 MG tablet Take 5 mg by mouth daily.      fish oil-omega-3 fatty acids 1000 MG capsule Take 1 g by mouth daily.      gabapentin (NEURONTIN) 400 MG capsule Take 400 mg by mouth 3 (three) times daily.      galantamine (RAZADYNE ER) 24 MG 24 hr capsule Take 24 mg by mouth daily with breakfast.      lubiprostone (AMITIZA) 24 MCG capsule Take 24 mcg by mouth 2 (two) times daily with a meal.      mirtazapine (REMERON) 15 MG tablet Take 1 tablet by mouth at bedtime.    Multiple Vitamin (MULTIVITAMIN) tablet Take 1 tablet by mouth daily.      omeprazole (PRILOSEC) 20 MG capsule Take 20 mg by mouth daily.      promethazine (PHENERGAN) 25 MG tablet 1-2 tablets by mouth every 6 hours as needed for nausea    sodium bicarbonate 325 MG tablet Take 325 mg by mouth 2 (two) times daily.      Tamsulosin HCl  (FLOMAX) 0.4 MG CAPS Take 0.4 mg by mouth daily.      traMADol (ULTRAM) 50 MG tablet Take 50 mg by mouth 3 (three) times daily as needed.      vitamin B-12 (CYANOCOBALAMIN) 250 MCG tablet Take 250 mcg by mouth daily.      nitroGLYCERIN (NITROSTAT) 0.4 MG SL tablet Place 0.4 mg under the tongue every 5 (five) minutes as needed. Chest pain      STOP taking these medications     predniSONE (DELTASONE) 10 MG tablet      SPS 15 GM/60ML suspension         Discharged Condition: Improved and stable.    Consults: None.  Significant Diagnostic Studies: Dg Chest 2 View  07/17/2011  *RADIOLOGY REPORT*  Clinical Data: Coughing for 3 weeks.  Intermittent fever.  CHEST - 2 VIEW  Comparison: 07/16/2011  Findings: No change from the previous study.  Parenchymal lung scarring, right greater left causes lung architectural distortion. No acute findings in the lungs.  Status post CABG surgery.  Cardiac silhouette normal in size and configuration.  No mediastinal or hilar masses/adenopathy.  No pleural effusion or pneumothorax.  IMPRESSION: Chronic changes.  No acute cardiopulmonary disease and no change from the previous day's study.  Original Report Authenticated By:  Dg Chest 2 View  07/16/2011  *RADIOLOGY REPORT*  Clinical Data: Cough, weakness.  CHEST - 2 VIEW  Comparison: 05/09/2010  Findings: Severe chronic changes in the lungs.  There is a scarring bilaterally, right greater than left.  Prior CABG.  Heart is normal size.  No effusions or acute bony abnormality.  IMPRESSION: Severe chronic lung disease.  No active disease.  Original Report Authenticated By: Cyndie Chime, M.D.    Lab Results: Basic Metabolic Panel:  Basename 07/18/11 0456 07/17/11 0418  NA 140 134*  K 5.5* 5.4*  CL 109 106  CO2 24 16*  GLUCOSE 106* 147*  BUN 30* 29*  CREATININE 1.91* 2.52*  CALCIUM 9.4 8.7  MG -- --  PHOS -- --   Liver Function Tests:  Basename 07/17/11 0418 07/16/11 1009  AST 19 20  ALT 13  16  ALKPHOS 54 61  BILITOT 0.6 0.3  PROT 7.2 7.5  ALBUMIN 2.8* 3.3*     CBC:  Basename 07/18/11 0456 07/17/11 0418 07/16/11 1009  WBC 11.4* 11.9* --  NEUTROABS -- -- 8.7*  HGB 11.1* 11.4* --  HCT 32.9* 34.9* --  MCV 98.8 100.6* --  PLT 150 136* --    Recent Results (from the past 240 hour(s))  CULTURE, BLOOD (ROUTINE X 2)     Status: Normal (Preliminary result)   Collection Time   07/16/11  2:56 PM      Component Value Range Status Comment   Specimen Description BLOOD RIGHT ANTECUBITAL   Final    Special Requests BOTTLES DRAWN AEROBIC AND ANAEROBIC 10CC   Final    Setup Time [id number]   Final    Culture     Final    Value: GRAM POSITIVE COCCI IN PAIRS     Note: Gram Stain Report Called to,Read Back By and Verified With: CHILDRESS J RN ON 07/17/11 @ 0915 BY RESSEGGER R Performed at [facility]   Report Status PENDING   Incomplete   CULTURE, BLOOD (ROUTINE X 2)     Status: Normal (Preliminary result)   Collection Time   07/16/11  3:10 PM      Component Value Range Status Comment   Specimen Description BLOOD RIGHT HAND   Final    Special Requests BOTTLES DRAWN AEROBIC AND ANAEROBIC [facility]   Final    Setup Time [id number]   Final    Culture     Final    Value: GRAM POSITIVE COCCI IN PAIRS     Note: Gram Stain Report Called to,Read Back By and Verified With: CHILDRESS J RN ON 07/17/11 @ 0915 BY RESSEGGER R Performed at [facility]   Report Status PENDING   Incomplete   MRSA PCR SCREENING     Status: Normal   Collection Time   07/16/11  7:40 PM      Component Value Range Status Comment   MRSA by PCR NEGATIVE  NEGATIVE  Final      Hospital Course: This 77 year old man was admitted with symptoms of fever and shaking for 2 days prior to admission. Has a history of COPD, chronic kidney disease and early stage dementia. He was treated with antibiotics and intravenous fluids. Influenza A was positive. Blood cultures were also taken and he was started on  empirical vancomycin and Zosyn. Blood cultures grew streptococcal pneumonia, sensitive to Avelox. However he is so much clinically improved that he is keen to go home. He denies any cough, fever, dyspnea.  Discharge Exam: Blood  pressure 123/73, pulse 73, temperature 98.2 F (36.8 C), temperature source Oral, resp. rate 20, height 5\' 7"  (1.702 m), weight 77.565 kg (171 lb), SpO2 96.00%. He does look systemically well. He is alert and orientated. He does not look l toxic at all. Heart sounds are present and normal. Lung fields are clear. There are no wheezes, crackles or bronchial breathing. There are no focal neurological signs.  Disposition: Home. I've given a prescription of Avelox for 5 further days.   Discharge Orders    Future Appointments: Provider: Department: Dept Phone: Center:   08/22/2011 1:30 PM Shan Levans, MD [facility] [phone] None   02/01/2012 2:15 PM Erick Colace, MD [facility] [phone] None     Future Orders Please Complete By Expires   Diet - low sodium heart healthy      Increase activity slowly         Follow-up Information    Follow up with [facility]. Make an appointment in 1 week.         SignedWilson Singer Pager [phone]  07/18/2011, 10:40 AM

## 2011-07-18 NOTE — Progress Notes (Signed)
07/18/11 1210 Patient being discharged home with wife, reviewed discharge instructions with patient, wife at bedside. Given copy of instructions, med list, f/u appointment information, and prescriptions. Verbalized understanding of instructions. Telemetry and IV site d/c'd, within normal limits. Pt in stable condition awaiting w/c for discharge home.

## 2011-07-19 LAB — CULTURE, BLOOD (ROUTINE X 2): Culture  Setup Time: 201212252212

## 2011-08-22 ENCOUNTER — Encounter: Payer: Self-pay | Admitting: Critical Care Medicine

## 2011-08-22 ENCOUNTER — Ambulatory Visit (INDEPENDENT_AMBULATORY_CARE_PROVIDER_SITE_OTHER): Payer: Medicare Other | Admitting: Critical Care Medicine

## 2011-08-22 DIAGNOSIS — J4489 Other specified chronic obstructive pulmonary disease: Secondary | ICD-10-CM

## 2011-08-22 DIAGNOSIS — J449 Chronic obstructive pulmonary disease, unspecified: Secondary | ICD-10-CM

## 2011-08-22 MED ORDER — AZITHROMYCIN 250 MG PO TABS: 250.0000 mg | ORAL_TABLET | Freq: Every day | ORAL | Status: AC

## 2011-08-22 MED ORDER — PREDNISONE 10 MG PO TABS: ORAL_TABLET | ORAL | Status: DC

## 2011-08-22 NOTE — Patient Instructions (Signed)
Prednisone 10mg  Take 4 for three days 3 for three days 2 for three days 1 for three days and stop Azithromycin 250mg  Take two once then one daily until gone No other medication changes Return 2 months

## 2011-08-22 NOTE — Assessment & Plan Note (Signed)
Severe chronic obstructive lung disease with asthmatic bronchitic components, recent flu illness with influenza A and associated Streptococcus pneumoniae bacteremia and pneumonia now improved Plan Maintain inhaled medications as prescribed The patient still has ongoing tracheobronchitis and for this the patient received azithromycin for 5 days and another 12 day course of prednisone

## 2011-08-22 NOTE — Progress Notes (Signed)
Subjective:    Patient ID: [name] [name], male    DOB: [birth year], 78 y.o.   MRN: [id number]  HPI  History of Present Illness:  Pulmonary OV:  [name] [name] is a 77 y.o.   African American male with history of chronic  obstructive lung disease - gold stage 3, asthmatic bronchitis, hypertension, reflux  disease. Quit smoking in 1994. He has had previous bypass surgery in 1997 Previous chronic chest wall pain after CABG followed at pain clinic.   11/30 Pt notes for one month more cough with yellow mucus. Notes more wheezing.  No real chest pain Notes some heavy pain.  Notes edema in the feet.  08/22/2011 Was in hospital for Flu  Influenza A.  Strep PnA bacteremia. In hosp 12/24- 12/26 Since then doing better.  Now some cough productive yellow.  Pt notes more congestion and still dyspneic  Past Medical History  Diagnosis Date  . Renal insufficiency   . PVD (peripheral vascular disease)   . Hypertension   . Hyperlipemia   . GERD (gastroesophageal reflux disease)   . CAD (coronary artery disease)   . COPD (chronic obstructive pulmonary disease)   . Monoclonal gammopathy      No family history on file.   History   Social History  . Marital Status: Married    Spouse Name: N/A    Number of Children: N/A  . Years of Education: N/A   Occupational History  . Not on file.   Social History Main Topics  . Smoking status: Former Smoker -- 0.5 packs/day for 40 years    Types: Cigarettes    Quit date: 07/23/1978  . Smokeless tobacco: Never Used  . Alcohol Use: No  . Drug Use: No  . Sexually Active: Not on file   Other Topics Concern  . Not on file   Social History Narrative  . No narrative on file     No Known Allergies   Outpatient Prescriptions Prior to Visit  Medication Sig Dispense Refill  . ALPRAZolam (XANAX) 0.5 MG tablet Take 0.5 mg by mouth as needed. anxiety      . aspirin 325 MG tablet Take 325 mg by mouth daily.        . budesonide-formoterol (SYMBICORT)  160-4.5 MCG/ACT inhaler Inhale 2 puffs into the lungs 2 (two) times daily.  1 Inhaler  6  . finasteride (PROSCAR) 5 MG tablet Take 5 mg by mouth daily.        . fish oil-omega-3 fatty acids 1000 MG capsule Take 1 g by mouth daily.        Marland Kitchen gabapentin (NEURONTIN) 400 MG capsule Take 400 mg by mouth 3 (three) times daily.        Marland Kitchen galantamine (RAZADYNE ER) 24 MG 24 hr capsule Take 24 mg by mouth daily with breakfast.        . lubiprostone (AMITIZA) 24 MCG capsule Take 24 mcg by mouth 2 (two) times daily with a meal.        . mirtazapine (REMERON) 15 MG tablet Take 1 tablet by mouth at bedtime.      . Multiple Vitamin (MULTIVITAMIN) tablet Take 1 tablet by mouth daily.        . nitroGLYCERIN (NITROSTAT) 0.4 MG SL tablet Place 0.4 mg under the tongue every 5 (five) minutes as needed. Chest pain      . omeprazole (PRILOSEC) 20 MG capsule Take 20 mg by mouth daily.        Marland Kitchen  promethazine (PHENERGAN) 25 MG tablet 1-2 tablets by mouth every 6 hours as needed for nausea      . sodium bicarbonate 325 MG tablet Take 325 mg by mouth 2 (two) times daily.        . Tamsulosin HCl (FLOMAX) 0.4 MG CAPS Take 0.4 mg by mouth daily.        . traMADol (ULTRAM) 50 MG tablet Take 50 mg by mouth 3 (three) times daily as needed.        . vitamin B-12 (CYANOCOBALAMIN) 250 MCG tablet Take 250 mcg by mouth daily.        Marland Kitchen moxifloxacin (AVELOX) 400 MG tablet Take 1 tablet (400 mg total) by mouth daily.  5 tablet  0     Review of Systems  Constitutional:   No  weight loss, night sweats,  Fevers, chills, fatigue, lassitude. HEENT:   No headaches,  Difficulty swallowing,  Tooth/dental problems,  Sore throat,                No sneezing, itching, ear ache, nasal congestion, post nasal drip,   CV:  No chest pain,  Orthopnea, PND, swelling in lower extremities, anasarca, dizziness, palpitations  GI  No heartburn, indigestion, abdominal pain, nausea, vomiting, diarrhea, change in bowel habits, loss of appetite  Resp: Notes   shortness of breath with exertion not  at rest.  No excess mucus, no productive cough,  No non-productive cough,  No coughing up of blood.  No change in color of mucus.  No wheezing.  No chest wall deformity  Skin: no rash or lesions.  GU: no dysuria, change in color of urine, no urgency or frequency.  No flank pain.  MS:  No joint pain or swelling.  No decreased range of motion.  No back pain.  Psych:  No change in mood or affect. No depression or anxiety.  No memory loss.     Objective:   Physical Exam  Filed Vitals:   08/22/11 1338  BP: 142/68  Pulse: 72  Temp: 98 F (36.7 C)  TempSrc: Oral  Height: 5\' 7"  (1.702 m)  Weight: 175 lb 9.6 oz (79.652 kg)  SpO2: 98%    Gen: Pleasant, well-nourished, in no distress,  normal affect  ENT: No lesions,  mouth clear,  oropharynx clear, no postnasal drip  Neck: No JVD, no TMG, no carotid bruits  Lungs: No use of accessory muscles, no dullness to percussion, distant BS, exp wheeze   Cardiovascular: RRR, heart sounds normal, no murmur or gallops, no peripheral edema  Abdomen: soft and NT, no HSM,  BS normal  Musculoskeletal: No deformities, no cyanosis or clubbing  Neuro: alert, non focal  Skin: Warm, no lesions or rashes        Assessment & Plan:   COPD Severe chronic obstructive lung disease with asthmatic bronchitic components, recent flu illness with influenza A and associated Streptococcus pneumoniae bacteremia and pneumonia now improved Plan Maintain inhaled medications as prescribed The patient still has ongoing tracheobronchitis and for this the patient received azithromycin for 5 days and another 12 day course of prednisone     Updated Medication List Outpatient Encounter Prescriptions as of 08/22/2011  Medication Sig Dispense Refill  . ALPRAZolam (XANAX) 0.5 MG tablet Take 0.5 mg by mouth as needed. anxiety      . aspirin 325 MG tablet Take 325 mg by mouth daily.        . budesonide-formoterol (SYMBICORT)  160-4.5 MCG/ACT inhaler Inhale 2 puffs  into the lungs 2 (two) times daily.  1 Inhaler  6  . finasteride (PROSCAR) 5 MG tablet Take 5 mg by mouth daily.        . fish oil-omega-3 fatty acids 1000 MG capsule Take 1 g by mouth daily.        Marland Kitchen gabapentin (NEURONTIN) 400 MG capsule Take 400 mg by mouth 3 (three) times daily.        Marland Kitchen galantamine (RAZADYNE ER) 24 MG 24 hr capsule Take 24 mg by mouth daily with breakfast.        . lubiprostone (AMITIZA) 24 MCG capsule Take 24 mcg by mouth 2 (two) times daily with a meal.        . mirtazapine (REMERON) 15 MG tablet Take 1 tablet by mouth at bedtime.      . Multiple Vitamin (MULTIVITAMIN) tablet Take 1 tablet by mouth daily.        . nitroGLYCERIN (NITROSTAT) 0.4 MG SL tablet Place 0.4 mg under the tongue every 5 (five) minutes as needed. Chest pain      . omeprazole (PRILOSEC) 20 MG capsule Take 20 mg by mouth daily.        . promethazine (PHENERGAN) 25 MG tablet 1-2 tablets by mouth every 6 hours as needed for nausea      . sodium bicarbonate 325 MG tablet Take 325 mg by mouth 2 (two) times daily.        . Tamsulosin HCl (FLOMAX) 0.4 MG CAPS Take 0.4 mg by mouth daily.        . traMADol (ULTRAM) 50 MG tablet Take 50 mg by mouth 3 (three) times daily as needed.        . TRAVATAN Z 0.004 % SOLN ophthalmic solution Place 1 drop into both eyes At bedtime.      . vitamin B-12 (CYANOCOBALAMIN) 250 MCG tablet Take 250 mcg by mouth daily.        Marland Kitchen azithromycin (ZITHROMAX) 250 MG tablet Take 1 tablet (250 mg total) by mouth daily. Take two once then one daily until gone  6 each  0  . predniSONE (DELTASONE) 10 MG tablet Take 4 for three days 3 for three days 2 for three days 1 for three days and stop  30 tablet  0  . DISCONTD: moxifloxacin (AVELOX) 400 MG tablet Take 1 tablet (400 mg total) by mouth daily.  5 tablet  0

## 2011-09-12 ENCOUNTER — Encounter (HOSPITAL_COMMUNITY)
Admission: RE | Admit: 2011-09-12 | Discharge: 2011-09-12 | Disposition: A | Discharge status: Home / Self Care | Payer: Medicare Other | Source: Ambulatory Visit | Attending: Ophthalmology | Admitting: Ophthalmology

## 2011-09-12 ENCOUNTER — Encounter (HOSPITAL_COMMUNITY): Payer: Self-pay | Admitting: Pharmacy Technician

## 2011-09-12 ENCOUNTER — Encounter (HOSPITAL_COMMUNITY): Payer: Self-pay

## 2011-09-12 HISTORY — DX: Benign prostatic hyperplasia without lower urinary tract symptoms: N40.0

## 2011-09-12 LAB — CBC
MCH: 32.6 pg (ref 26.0–34.0)
MCHC: 33.8 g/dL (ref 30.0–36.0)
MCV: 96.6 fL (ref 78.0–100.0)
Platelets: 143 10*3/uL — ABNORMAL LOW (ref 150–400)

## 2011-09-12 LAB — BASIC METABOLIC PANEL
BUN: 18 mg/dL (ref 6–23)
Calcium: 9.4 mg/dL (ref 8.4–10.5)
GFR calc Af Amer: 47 mL/min — ABNORMAL LOW (ref >90)
GFR calc non Af Amer: 41 mL/min — ABNORMAL LOW (ref >90)
Potassium: 5.2 mEq/L — ABNORMAL HIGH (ref 3.5–5.1)
Sodium: 140 mEq/L (ref 135–145)

## 2011-09-12 LAB — DIFFERENTIAL
Basophils Relative: 0 % (ref 0–1)
Eosinophils Absolute: 0.1 10*3/uL (ref 0.0–0.7)
Eosinophils Relative: 2 % (ref 0–5)
Neutrophils Relative %: 41 % — ABNORMAL LOW (ref 43–77)

## 2011-09-12 NOTE — Patient Instructions (Addendum)
20 [name] [name]  09/12/2011   Your procedure is scheduled on:  09/18/11  Report to [facility] at 07:00 AM.  Call this number if you have problems the morning of surgery: [phone]   Remember:   Do not eat food:After Midnight.  May have clear liquids:until Midnight .  Clear liquids include soda, tea, black coffee, apple or grape juice, broth.  Take these medicines the morning of surgery with A SIP OF WATER: Xanax, Flomax, Finasteride, Gabapentin, Omeprazole and Razadyne. Take your Promethazine and Tramadol only if needed. Also, use your inhaler, Symbicort.   Do not wear jewelry, make-up or nail polish.  Do not wear lotions, powders, or perfumes. You may wear deodorant.  Do not shave 48 hours prior to surgery.  Do not bring valuables to the hospital.  Contacts, dentures or bridgework may not be worn into surgery.  Leave suitcase in the car. After surgery it may be brought to your room.  For patients admitted to the hospital, checkout time is 11:00 AM the day of discharge.   Patients discharged the day of surgery will not be allowed to drive home.  Name and phone number of your driver:   Special Instructions: N/A   Please read over the following fact sheets that you were given: Anesthesia Post-op Instructions and Care and Recovery After Surgery    Cataract A cataract is a clouding of the lens of the eye. It is most often related to aging. A cataract is not a "film" over the surface of the eye. The lens is inside the eye and changes size of the pupil. The lens can enlarge to let more light enter the eye in dark environments and contract the size of the pupil to let in bright light. The lens is the part of the eye that helps focus light on the retina. The retina is the eye's light-sensitive layer. It is in the back of the eye that sends visual signals to the brain. In a normal eye, light passes through the lens and gets focused on the retina. To help produce a sharp image, the lens must  remain clear. When a lens becomes cloudy, vision is compromised by the degree and nature of the clouding. Certain cataracts make people more near-sighted as they develop, others increase glare, and all reduce vision to some degree or another. A cataract that is so dense that it becomes milky Tejon Gracie and a Taylia Berber opacity can be seen through the pupil. When the Lamine Laton color is seen, it is called a "mature" or "hyper-mature cataract." Such cataracts cause total blindness in the affected eye. The cataract must be removed to prevent damage to the eye itself. Some types of cataracts can cause a secondary disease of the eye, such as certain types of glaucoma. In the early stages, better lighting and eyeglasses may lessen vision problems caused by cataracts. At a certain point, surgery may be needed to improve vision. CAUSES   Aging. However, cataracts may occur at any age, even in newborns.   Certain drugs.   Trauma to the eye.   Certain diseases (such as diabetes).   Inherited or acquired medical syndromes.  SYMPTOMS   Gradual, progressive drop in vision in the affected eye. Cataracts may develop at different rates in each eye. Cataracts may even be in just one eye with the other unaffected.   Cataracts due to trauma may develop quickly, sometimes over a matter or days or even hours. The result is severe and rapid visual  loss.  DIAGNOSIS  To detect a cataract, an eye doctor examines the lens. A well developed cataract can be diagnosed without dilating the pupil. Early cataracts and others of a specific nature are best diagnosed with an exam of the eyes with the pupils dilated by drops. TREATMENT   For an early cataract, vision may improve by using different eyeglasses or stronger lighting.   If the above measures do not help, surgery is the only effective treatment. This treatment removes the cloudy lens and replaces it with a substitute lens (Intraocular lens, or IOL). Newly developed IOL technology  allows the implanted lens to improve vision both at a distance and up close. Discuss with your eye surgeon about the possibility of still needing glasses. Also discuss how visual coordination between both eyes will be affected.  A cataract needs to be removed only when vision loss interferes with your everyday activities such as driving, reading or watching TV. You and your eye doctor can make that decision together. In most cases, waiting until you are ready to have cataract surgery will not harm your eye. If you have cataracts in both eyes, only one should be removed at a time. This allows the operated eye to heal and be out of danger from serious problems (such as infection or poor wound healing) before having the other eye undergo surgery.  Sometimes, a cataract should be removed even if it does not cause problems with your vision. For example, a cataract should be removed if it prevents examination or treatment of another eye problem. Just as you cannot see out of the affected eye well, your doctor cannot see into your eye well through a cataract. The vast majority of people who have cataract surgery have better vision afterward. CATARACT REMOVAL There are two primary ways to remove a cataract. Your doctor can explain the differences and help determine which is best for you:  Phacoemulsification (small incision cataract surgery). This involves making a small cut (incision) on the edge of the clear, dome-shaped surface that covers the front of the eye (the cornea). An injection behind the eye or eye drops are given to make this a painless procedure. The doctor then inserts a tiny probe into the eye. This device emits ultrasound waves that soften and break up the cloudy center of the lens so it can be removed by suction. Most cataract surgery is done this way. The cuts are usually so small and performed in such a manner that often no sutures are needed to keep it closed.   Extracapsular surgery. Your  doctor makes a slightly longer incision on the side of the cornea. The doctor removes the hard center of the lens. The remainder of the lens is then removed by suction. In some cases, extremely fine sutures are needed which the doctor may, or may not remove in the office after the surgery.  When an IOL is implanted, it needs no care. It becomes a permanent part of your eye and cannot be seen or felt.  Some people cannot have an IOL. They may have problems during surgery, or maybe they have another eye disease. For these people, a soft contact lens may be suggested. If an IOL or contact lens cannot be used, very powerful and thick glasses are required after surgery. Since vision is very different through such thick glasses, it is important to have your doctor discuss the impact on your vision after any cataract surgery where there is no plan to implant an  IOL. The normal lens of the eye is covered by a clear capsule. Both phacoemulsification and extracapsular surgery require that the back surface of this lens capsule be left in place. This helps support IOLs and prevents the IOL from dislocating and falling back into the deeper interior of the eye. Right after surgery, and often permanently this "posterior capsule" remains clear. In some cases however, it can become cloudy, presenting the same type of visual compromise that the original cataract did since light is again obstructed as it passes through the clear IOL. This condition is often referred to as an "after-cataract." Fortunately, after-cataracts are easily treated using a painless and very fast laser treatment that is performed without anesthesia or incisions. It is done in a matter of minutes in an outpatient environment. Visual improvement is often immediate.  HOME CARE INSTRUCTIONS   Your surgeon will discuss pre and post operative care with you prior to surgery. The majority of people are able to do almost all normal activities right away. Although,  it is often advised to avoid strenuous activity for a period of time.   Postoperative drops and careful avoidance of infection will be needed. Many surgeons suggest the use of a protective shield during the first few days after surgery.   There is a very small incidence of complication from modern cataract surgery, but it can happen. Infection that spreads to the inside of the eye (endophthalmitis) can result in total visual loss and even loss of the eye itself. In extremely rare instances, the inflammation of endophthalmitis can spread to both eyes (sympathetic ophthalmia). Appropriate post-operative care under the close observation of your surgeon is essential to a successful outcome.  SEEK IMMEDIATE MEDICAL CARE IF:   You have any sudden drop of vision in the operated eye.   You have pain in the operated eye.   You see a large number of floating dots in the field of vision in the operated eye.   You see flashing lights, or if a portion of your side vision in any direction appears black (like a curtain being drawn into your field of vision) in the operated eye.  Document Released: 07/09/2005 Document Revised: 03/21/2011 Document Reviewed: 08/25/2007 [facility] Patient Information 2012 [place], [place].   PATIENT INSTRUCTIONS POST-ANESTHESIA  IMMEDIATELY FOLLOWING SURGERY:  Do not drive or operate machinery for the first twenty four hours after surgery.  Do not make any important decisions for twenty four hours after surgery or while taking narcotic pain medications or sedatives.  If you develop intractable nausea and vomiting or a severe headache please notify your doctor immediately.  FOLLOW-UP:  Please make an appointment with your surgeon as instructed. You do not need to follow up with anesthesia unless specifically instructed to do so.  WOUND CARE INSTRUCTIONS (if applicable):  Keep a dry clean dressing on the anesthesia/puncture wound site if there is drainage.  Once the wound has quit  draining you may leave it open to air.  Generally you should leave the bandage intact for twenty four hours unless there is drainage.  If the epidural site drains for more than 36-48 hours please call the anesthesia department.  QUESTIONS?:  Please feel free to call your physician or the hospital operator if you have any questions, and they will be happy to assist you.     [facility] Anesthesia Department [address] [place] [phone]

## 2011-09-17 MED ORDER — TETRACAINE HCL 0.5 % OP SOLN
OPHTHALMIC | Status: AC
  Filled 2011-09-17: qty 2

## 2011-09-17 MED ORDER — CYCLOPENTOLATE-PHENYLEPHRINE 0.2-1 % OP SOLN
OPHTHALMIC | Status: AC
  Filled 2011-09-17: qty 2

## 2011-09-17 MED ORDER — FLURBIPROFEN SODIUM 0.03 % OP SOLN
OPHTHALMIC | Status: AC
  Filled 2011-09-17: qty 2.5

## 2011-09-18 ENCOUNTER — Ambulatory Visit (HOSPITAL_COMMUNITY): Payer: Medicare Other | Admitting: Anesthesiology

## 2011-09-18 ENCOUNTER — Encounter (HOSPITAL_COMMUNITY): Payer: Self-pay | Admitting: Anesthesiology

## 2011-09-18 ENCOUNTER — Ambulatory Visit (HOSPITAL_COMMUNITY)
Admission: RE | Admit: 2011-09-18 | Discharge: 2011-09-18 | Disposition: A | Discharge status: Home / Self Care | Payer: Medicare Other | Source: Ambulatory Visit | Attending: Ophthalmology | Admitting: Ophthalmology

## 2011-09-18 ENCOUNTER — Encounter (HOSPITAL_COMMUNITY)
Admission: RE | Disposition: A | Discharge status: Home / Self Care | Payer: Self-pay | Source: Ambulatory Visit | Attending: Ophthalmology

## 2011-09-18 ENCOUNTER — Encounter (HOSPITAL_COMMUNITY): Payer: Self-pay | Admitting: *Deleted

## 2011-09-18 DIAGNOSIS — Z951 Presence of aortocoronary bypass graft: Secondary | ICD-10-CM | POA: Insufficient documentation

## 2011-09-18 DIAGNOSIS — H2510 Age-related nuclear cataract, unspecified eye: Secondary | ICD-10-CM | POA: Insufficient documentation

## 2011-09-18 DIAGNOSIS — E7800 Pure hypercholesterolemia, unspecified: Secondary | ICD-10-CM | POA: Insufficient documentation

## 2011-09-18 DIAGNOSIS — I10 Essential (primary) hypertension: Secondary | ICD-10-CM | POA: Insufficient documentation

## 2011-09-18 DIAGNOSIS — Z01812 Encounter for preprocedural laboratory examination: Secondary | ICD-10-CM | POA: Insufficient documentation

## 2011-09-18 DIAGNOSIS — Z79899 Other long term (current) drug therapy: Secondary | ICD-10-CM | POA: Insufficient documentation

## 2011-09-18 HISTORY — PX: CATARACT EXTRACTION W/PHACO: SHX586

## 2011-09-18 SURGERY — PHACOEMULSIFICATION, CATARACT, WITH IOL INSERTION
Anesthesia: Monitor Anesthesia Care | Site: Eye | Laterality: Right | Wound class: Clean

## 2011-09-18 MED ORDER — CYCLOPENTOLATE-PHENYLEPHRINE 0.2-1 % OP SOLN
1.0000 [drp] | OPHTHALMIC | Status: AC
  Administered 2011-09-18 (×3): 1 [drp] via OPHTHALMIC

## 2011-09-18 MED ORDER — EPINEPHRINE HCL 1 MG/ML IJ SOLN
INTRAOCULAR | Status: DC | PRN
  Administered 2011-09-18: 09:00:00

## 2011-09-18 MED ORDER — PHENYLEPHRINE HCL 2.5 % OP SOLN
1.0000 [drp] | OPHTHALMIC | Status: AC
  Administered 2011-09-18 (×3): 1 [drp] via OPHTHALMIC

## 2011-09-18 MED ORDER — BSS IO SOLN
INTRAOCULAR | Status: DC | PRN
  Administered 2011-09-18: 15 mL via INTRAOCULAR

## 2011-09-18 MED ORDER — PROVISC 10 MG/ML IO SOLN
INTRAOCULAR | Status: DC | PRN
  Administered 2011-09-18: .85 mL via INTRAOCULAR

## 2011-09-18 MED ORDER — LACTATED RINGERS IV SOLN
INTRAVENOUS | Status: DC | PRN
  Administered 2011-09-18: 08:00:00 via INTRAVENOUS

## 2011-09-18 MED ORDER — LACTATED RINGERS IV SOLN
INTRAVENOUS | Status: DC
  Administered 2011-09-18: 1000 mL via INTRAVENOUS

## 2011-09-18 MED ORDER — EPINEPHRINE HCL 1 MG/ML IJ SOLN
INTRAMUSCULAR | Status: AC
  Filled 2011-09-18: qty 1

## 2011-09-18 MED ORDER — TETRACAINE 0.5 % OP SOLN OPTIME - NO CHARGE
OPHTHALMIC | Status: DC | PRN
  Administered 2011-09-18: 1 [drp] via OPHTHALMIC

## 2011-09-18 MED ORDER — MIDAZOLAM HCL 2 MG/2ML IJ SOLN
1.0000 mg | INTRAMUSCULAR | Status: DC | PRN
  Administered 2011-09-18: 2 mg via INTRAVENOUS

## 2011-09-18 MED ORDER — PHENYLEPHRINE HCL 2.5 % OP SOLN
OPHTHALMIC | Status: AC
  Administered 2011-09-18: 1 [drp] via OPHTHALMIC
  Filled 2011-09-18: qty 2

## 2011-09-18 MED ORDER — TETRACAINE HCL 0.5 % OP SOLN
1.0000 [drp] | OPHTHALMIC | Status: AC
  Administered 2011-09-18 (×3): 1 [drp] via OPHTHALMIC

## 2011-09-18 MED ORDER — FLURBIPROFEN SODIUM 0.03 % OP SOLN
1.0000 [drp] | OPHTHALMIC | Status: AC
  Administered 2011-09-18 (×3): 1 [drp] via OPHTHALMIC

## 2011-09-18 MED ORDER — MIDAZOLAM HCL 2 MG/2ML IJ SOLN
INTRAMUSCULAR | Status: AC
  Administered 2011-09-18: 2 mg via INTRAVENOUS
  Filled 2011-09-18: qty 2

## 2011-09-18 SURGICAL SUPPLY — 21 items
CAPSULAR TENSION RING-AMO (OPHTHALMIC RELATED) IMPLANT
CLOTH BEACON ORANGE TIMEOUT ST (SAFETY) ×1 IMPLANT
EYE SHIELD UNIVERSAL CLEAR (GAUZE/BANDAGES/DRESSINGS) ×1 IMPLANT
GLOVE BIOGEL M 6.5 STRL (GLOVE) ×1 IMPLANT
GLOVE ECLIPSE 6.5 STRL STRAW (GLOVE) IMPLANT
GLOVE ECLIPSE 7.0 STRL STRAW (GLOVE) IMPLANT
GLOVE EXAM NITRILE LRG STRL (GLOVE) IMPLANT
GLOVE EXAM NITRILE MD LF STRL (GLOVE) ×1 IMPLANT
GLOVE SKINSENSE NS SZ6.5 (GLOVE)
GLOVE SKINSENSE STRL SZ6.5 (GLOVE) IMPLANT
HEALON 5 0.6 ML (INTRAOCULAR LENS) IMPLANT
KIT VITRECTOMY (OPHTHALMIC RELATED) IMPLANT
PAD ARMBOARD 7.5X6 YLW CONV (MISCELLANEOUS) ×1 IMPLANT
PROC W NO LENS (INTRAOCULAR LENS)
PROC W SPEC LENS (INTRAOCULAR LENS)
PROCESS W NO LENS (INTRAOCULAR LENS) IMPLANT
PROCESS W SPEC LENS (INTRAOCULAR LENS) IMPLANT
RING MALYGIN (MISCELLANEOUS) ×1 IMPLANT
SIGHTPATH CAT PROC W REG LENS (Ophthalmic Related) ×2 IMPLANT
VISCOELASTIC ADDITIONAL (OPHTHALMIC RELATED) IMPLANT
WATER STERILE IRR 250ML POUR (IV SOLUTION) ×1 IMPLANT

## 2011-09-18 NOTE — Anesthesia Postprocedure Evaluation (Signed)
  Anesthesia Post-op Note  Patient: [name] [name]  Procedure(s) Performed: Procedure(s) (LRB): CATARACT EXTRACTION PHACO AND INTRAOCULAR LENS PLACEMENT (IOC) (Right)  Patient Location: Short Stay  Anesthesia Type: MAC  Level of Consciousness: awake, alert , oriented and patient cooperative  Airway and Oxygen Therapy: Patient Spontanous Breathing  Post-op Pain: none  Post-op Assessment: Post-op Vital signs reviewed, Patient's Cardiovascular Status Stable, Respiratory Function Stable and Patent Airway  Post-op Vital Signs: Reviewed and stable  Complications: No apparent anesthesia complications

## 2011-09-18 NOTE — Transfer of Care (Signed)
Immediate Anesthesia Transfer of Care Note  Patient: [name] [name]  Procedure(s) Performed: Procedure(s) (LRB): CATARACT EXTRACTION PHACO AND INTRAOCULAR LENS PLACEMENT (IOC) (Right)  Patient Location: Short Stay  Anesthesia Type: MAC  Level of Consciousness: awake, alert , oriented and patient cooperative  Airway & Oxygen Therapy: Patient Spontanous Breathing  Post-op Assessment: Report given to PACU RN, Post -op Vital signs reviewed and stable and Patient moving all extremities  Post vital signs: Reviewed and stable  Complications: No apparent anesthesia complications

## 2011-09-18 NOTE — Preoperative (Signed)
Beta Blockers   Reason not to administer Beta Blockers:Not Applicable 

## 2011-09-18 NOTE — Anesthesia Preprocedure Evaluation (Signed)
Anesthesia Evaluation  Patient identified by MRN, date of birth, ID band Patient awake    Reviewed: Allergy & Precautions, H&P , NPO status , Patient's Chart, lab work & pertinent test results, reviewed documented beta blocker date and time   Airway Mallampati: II      Dental  (+) Edentulous Upper and Edentulous Lower   Pulmonary  clear to auscultation        Cardiovascular hypertension, + CAD, + CABG and + Peripheral Vascular Disease Regular Normal    Neuro/Psych    GI/Hepatic GERD-  Medicated and Controlled,  Endo/Other    Renal/GU      Musculoskeletal   Abdominal   Peds  Hematology   Anesthesia Other Findings   Reproductive/Obstetrics                           Anesthesia Physical Anesthesia Plan  ASA: III  Anesthesia Plan: MAC   Post-op Pain Management:    Induction: Intravenous  Airway Management Planned: Nasal Cannula  Additional Equipment:   Intra-op Plan:   Post-operative Plan:   Informed Consent: I have reviewed the patients History and Physical, chart, labs and discussed the procedure including the risks, benefits and alternatives for the proposed anesthesia with the patient or authorized representative who has indicated his/her understanding and acceptance.     Plan Discussed with:   Anesthesia Plan Comments:         Anesthesia Quick Evaluation

## 2011-09-18 NOTE — Op Note (Signed)
Patient brought to the operating room and prepped and draped in the usual manner.  Lid speculum inserted in right eye.  Stab incision made at the twelve o'clock position.  Provisc instilled in the anterior chamber.   A 2.4 mm. Stab incision was made temporally.  An anterior capsulotomy was done with a bent 25 gauge needle.  The nucleus was hydrodissected.  The Phaco tip was inserted in the anterior chamber and the nucleus was emulsified.  CDE was 6.19.  The cortical material was then removed with the I and A tip.  Posterior capsule was the polished.  The anterior chamber was deepened with Provisc.  A 21.0 Diopter Rayner 570C IOL was then inserted in the capsular bag.  Provisc was then removed with the I and A tip.  The wound was then hydrated.  Patient sent to the Recovery Room in good condition with follow up in my office.

## 2011-09-18 NOTE — H&P (Signed)
The patient was re examined and there is no change in the patients condition since the original H and P. 

## 2011-09-20 ENCOUNTER — Encounter (HOSPITAL_COMMUNITY): Payer: Self-pay | Admitting: Ophthalmology

## 2011-10-24 ENCOUNTER — Ambulatory Visit (INDEPENDENT_AMBULATORY_CARE_PROVIDER_SITE_OTHER): Payer: Medicare Other | Admitting: Critical Care Medicine

## 2011-10-24 ENCOUNTER — Encounter: Payer: Self-pay | Admitting: Critical Care Medicine

## 2011-10-24 DIAGNOSIS — J449 Chronic obstructive pulmonary disease, unspecified: Secondary | ICD-10-CM

## 2011-10-24 DIAGNOSIS — J45909 Unspecified asthma, uncomplicated: Secondary | ICD-10-CM

## 2011-10-24 MED ORDER — LOSARTAN POTASSIUM 50 MG PO TABS: 50.0000 mg | ORAL_TABLET | Freq: Every day | ORAL | Status: DC

## 2011-10-24 MED ORDER — METHYLPREDNISOLONE ACETATE 80 MG/ML IJ SUSP
120.0000 mg | Freq: Once | INTRAMUSCULAR | Status: AC
  Administered 2011-10-24: 120 mg via INTRAMUSCULAR

## 2011-10-24 MED ORDER — BUDESONIDE-FORMOTEROL FUMARATE 160-4.5 MCG/ACT IN AERO: 2.0000 | INHALATION_SPRAY | Freq: Two times a day (BID) | RESPIRATORY_TRACT | Status: DC

## 2011-10-24 MED ORDER — AMOXICILLIN-POT CLAVULANATE 875-125 MG PO TABS: 1.0000 | ORAL_TABLET | Freq: Two times a day (BID) | ORAL | Status: AC

## 2011-10-24 NOTE — Progress Notes (Signed)
Subjective:    Patient ID: [name] [name], male    DOB: 1931-07-12, 78 y.o.   MRN: [id number]  HPI  History of Present Illness:  Pulmonary OV:  [name] [name] is a 78 y.o.   African American male with history of chronic  obstructive lung disease - gold stage 3, asthmatic bronchitis, hypertension, reflux  disease. Quit smoking in 1994. He has had previous bypass surgery in 1997 Previous chronic chest wall pain after CABG followed at pain clinic.   11/30 Pt notes for one month more cough with yellow mucus. Notes more wheezing.  No real chest pain Notes some heavy pain.  Notes edema in the feet.  1/30 Was in hospital for Flu  Influenza A.  Strep PnA bacteremia. In hosp 12/24- 12/26 Since then doing better.  Now some cough productive yellow.  Pt notes more congestion and still dyspneic  10/24/2011 Just had heart tests this week, results pending.   Pt with more cough, more dyspnea. Some pndrip Mucus is yellow only.   Past Medical History  Diagnosis Date  . Renal insufficiency   . PVD (peripheral vascular disease)   . Hypertension   . Hyperlipemia   . GERD (gastroesophageal reflux disease)   . CAD (coronary artery disease)   . COPD (chronic obstructive pulmonary disease)   . Monoclonal gammopathy   . BPH (benign prostatic hyperplasia)   . DEMENTIA      Family History  Problem Relation Age of Onset  . Anesthesia problems Neg Hx   . Hypotension Neg Hx   . Malignant hyperthermia Neg Hx   . Pseudochol deficiency Neg Hx      History   Social History  . Marital Status: Married    Spouse Name: N/A    Number of Children: N/A  . Years of Education: N/A   Occupational History  . Not on file.   Social History Main Topics  . Smoking status: Former Smoker -- 0.5 packs/day for 40 years    Types: Cigarettes    Quit date: 07/23/1978  . Smokeless tobacco: Never Used  . Alcohol Use: No  . Drug Use: No  . Sexually Active: Not on file   Other Topics Concern  . Not on file    Social History Narrative  . No narrative on file     No Known Allergies   Outpatient Prescriptions Prior to Visit  Medication Sig Dispense Refill  . ALPRAZolam (XANAX) 0.5 MG tablet Take 0.5 mg by mouth daily as needed. anxiety      . aspirin 325 MG tablet Take 325 mg by mouth daily.        . finasteride (PROSCAR) 5 MG tablet Take 5 mg by mouth daily.        Marland Kitchen gabapentin (NEURONTIN) 400 MG capsule Take 400 mg by mouth 3 (three) times daily.        Marland Kitchen galantamine (RAZADYNE ER) 24 MG 24 hr capsule Take 24 mg by mouth daily with breakfast.      . lubiprostone (AMITIZA) 24 MCG capsule Take 24 mcg by mouth 2 (two) times daily with a meal.       . mirtazapine (REMERON) 15 MG tablet Take 30 mg by mouth at bedtime.       . Multiple Vitamin (MULTIVITAMIN) tablet Take 1 tablet by mouth daily.        . nitroGLYCERIN (NITROSTAT) 0.4 MG SL tablet Place 0.4 mg under the tongue every 5 (five) minutes as needed. Chest pain      .  omeprazole (PRILOSEC) 20 MG capsule Take 20 mg by mouth daily.        . promethazine (PHENERGAN) 25 MG tablet 1-2 tablets by mouth every 6 hours as needed for nausea      . sodium bicarbonate 325 MG tablet Take 325 mg by mouth 2 (two) times daily.        . Tamsulosin HCl (FLOMAX) 0.4 MG CAPS Take 0.4 mg by mouth daily.        . traMADol (ULTRAM) 50 MG tablet Take 50 mg by mouth 3 (three) times daily as needed. pain      . TRAVATAN Z 0.004 % SOLN ophthalmic solution Place 1 drop into both eyes At bedtime.      . vitamin B-12 (CYANOCOBALAMIN) 250 MCG tablet Take 250 mcg by mouth daily.        . budesonide-formoterol (SYMBICORT) 160-4.5 MCG/ACT inhaler Inhale 2 puffs into the lungs 2 (two) times daily.  1 Inhaler  6   No facility-administered medications prior to visit.     Review of Systems  Constitutional:   No  weight loss, night sweats,  Fevers, chills, fatigue, lassitude. HEENT:   No headaches,  Difficulty swallowing,  Tooth/dental problems,  Sore throat,                 No sneezing, itching, ear ache, nasal congestion, post nasal drip,   CV:  No chest pain,  Orthopnea, PND, swelling in lower extremities, anasarca, dizziness, palpitations  GI  No heartburn, indigestion, abdominal pain, nausea, vomiting, diarrhea, change in bowel habits, loss of appetite  Resp: Notes  shortness of breath with exertion not  at rest.  No excess mucus, no productive cough,  No non-productive cough,  No coughing up of blood.  No change in color of mucus.  No wheezing.  No chest wall deformity  Skin: no rash or lesions.  GU: no dysuria, change in color of urine, no urgency or frequency.  No flank pain.  MS:  No joint pain or swelling.  No decreased range of motion.  No back pain.  Psych:  No change in mood or affect. No depression or anxiety.  No memory loss.     Objective:   Physical Exam  Filed Vitals:   10/24/11 1342  BP: 120/70  Pulse: 64  Temp: 97.3 F (36.3 C)  TempSrc: Oral  Height: 5\' 7"  (1.702 m)  Weight: 176 lb 6.4 oz (80.015 kg)  SpO2: 97%    Gen: Pleasant, well-nourished, in no distress,  normal affect  ENT: No lesions,  mouth clear,  oropharynx clear, no postnasal drip  Neck: No JVD, no TMG, no carotid bruits  Lungs: No use of accessory muscles, no dullness to percussion, distant BS, exp wheeze   Cardiovascular: RRR, heart sounds normal, no murmur or gallops, no peripheral edema  Abdomen: soft and NT, no HSM,  BS normal  Musculoskeletal: No deformities, no cyanosis or clubbing  Neuro: alert, non focal  Skin: Warm, no lesions or rashes        Assessment & Plan:   COPD Chronic obstructive lung disease with asthmatic bronchitic component now with flare I am concerned that the ACE inhibitors contributing to the patient's cough cyclical basis Plan A Depomedrol 120mg  IM injection is given Take augmentin twice daily for 10days Stay on symbicort Stop lotensin Start Losartan 50mg  daily to help reduce cough Return 3  months      Updated Medication List Outpatient Encounter Prescriptions as of 10/24/2011  Medication Sig Dispense Refill  . ALPRAZolam (XANAX) 0.5 MG tablet Take 0.5 mg by mouth daily as needed. anxiety      . aspirin 325 MG tablet Take 325 mg by mouth daily.        . budesonide-formoterol (SYMBICORT) 160-4.5 MCG/ACT inhaler Inhale 2 puffs into the lungs 2 (two) times daily.  1 Inhaler  6  . finasteride (PROSCAR) 5 MG tablet Take 5 mg by mouth daily.        Marland Kitchen gabapentin (NEURONTIN) 400 MG capsule Take 400 mg by mouth 3 (three) times daily.        Marland Kitchen galantamine (RAZADYNE ER) 24 MG 24 hr capsule Take 24 mg by mouth daily with breakfast.      . lubiprostone (AMITIZA) 24 MCG capsule Take 24 mcg by mouth 2 (two) times daily with a meal.       . mirtazapine (REMERON) 15 MG tablet Take 30 mg by mouth at bedtime.       . Multiple Vitamin (MULTIVITAMIN) tablet Take 1 tablet by mouth daily.        . nitroGLYCERIN (NITROSTAT) 0.4 MG SL tablet Place 0.4 mg under the tongue every 5 (five) minutes as needed. Chest pain      . ofloxacin (OCUFLOX) 0.3 % ophthalmic solution Place 1 drop into the right eye Twice daily.      Marland Kitchen omeprazole (PRILOSEC) 20 MG capsule Take 20 mg by mouth daily.        . promethazine (PHENERGAN) 25 MG tablet 1-2 tablets by mouth every 6 hours as needed for nausea      . sodium bicarbonate 325 MG tablet Take 325 mg by mouth 2 (two) times daily.        . Tamsulosin HCl (FLOMAX) 0.4 MG CAPS Take 0.4 mg by mouth daily.        . traMADol (ULTRAM) 50 MG tablet Take 50 mg by mouth 3 (three) times daily as needed. pain      . TRAVATAN Z 0.004 % SOLN ophthalmic solution Place 1 drop into both eyes At bedtime.      . vitamin B-12 (CYANOCOBALAMIN) 250 MCG tablet Take 250 mcg by mouth daily.        Marland Kitchen DISCONTD: benazepril (LOTENSIN) 10 MG tablet Take 1 tablet by mouth daily.      Marland Kitchen DISCONTD: budesonide-formoterol (SYMBICORT) 160-4.5 MCG/ACT inhaler Inhale 2 puffs into the lungs 2 (two) times  daily.  1 Inhaler  6  . amoxicillin-clavulanate (AUGMENTIN) 875-125 MG per tablet Take 1 tablet by mouth 2 (two) times daily.  20 tablet  0  . losartan (COZAAR) 50 MG tablet Take 1 tablet (50 mg total) by mouth daily.  30 tablet  6  . DISCONTD: DUREZOL 0.05 % EMUL Place 1 drop into both eyes At bedtime.       Facility-Administered Encounter Medications as of 10/24/2011  Medication Dose Route Frequency Provider Last Rate Last Dose  . methylPREDNISolone acetate (DEPO-MEDROL) injection 120 mg  120 mg Intramuscular Once Storm Frisk, MD   120 mg at 10/24/11 1355

## 2011-10-24 NOTE — Assessment & Plan Note (Addendum)
Chronic obstructive lung disease with asthmatic bronchitic component now with flare I am concerned that the ACE inhibitors contributing to the patient's cough cyclical basis Plan A Depomedrol 120mg  IM injection is given Take augmentin twice daily for 10days Stay on symbicort Stop lotensin Start Losartan 50mg  daily to help reduce cough Return 3 months

## 2011-10-24 NOTE — Patient Instructions (Signed)
A Depomedrol 120mg  IM injection is given Take augmentin twice daily for 10days Stay on symbicort Stop lotensin Start Losartan 50mg  daily to help reduce cough Return 3 months

## 2011-11-12 ENCOUNTER — Telehealth: Payer: Self-pay | Admitting: *Deleted

## 2011-11-12 MED ORDER — GABAPENTIN 400 MG PO CAPS: 400.0000 mg | ORAL_CAPSULE | Freq: Three times a day (TID) | ORAL | Status: DC

## 2011-11-12 NOTE — Telephone Encounter (Signed)
Needs Gabapentin refilled. Rx sent to [facility]. Pt aware.

## 2011-12-03 ENCOUNTER — Encounter (HOSPITAL_COMMUNITY): Payer: Self-pay | Admitting: *Deleted

## 2011-12-03 ENCOUNTER — Emergency Department (HOSPITAL_COMMUNITY)
Admission: EM | Admit: 2011-12-03 | Discharge: 2011-12-03 | Disposition: A | Discharge status: Home / Self Care | Payer: Medicare Other | Attending: Emergency Medicine | Admitting: Emergency Medicine

## 2011-12-03 DIAGNOSIS — R222 Localized swelling, mass and lump, trunk: Secondary | ICD-10-CM | POA: Insufficient documentation

## 2011-12-03 NOTE — ED Notes (Signed)
Registration says pt left 

## 2011-12-03 NOTE — ED Notes (Addendum)
Pt has knot on rt ant chest , present for "long time" but began hurting 2 days ago and is tender.    Rectal bleeding last week.  Wife says pt is weak and was  Difficult to awaken earlier today.

## 2011-12-26 ENCOUNTER — Telehealth: Payer: Self-pay | Admitting: Oncology

## 2011-12-26 NOTE — Telephone Encounter (Signed)
S/w the pt and he is aware of his sept,oct appts. Pt requested for [place] to mail him his appt calendars

## 2012-02-01 ENCOUNTER — Ambulatory Visit: Payer: Medicare Other | Admitting: Physical Medicine & Rehabilitation

## 2012-02-11 ENCOUNTER — Ambulatory Visit: Payer: Medicare Other | Admitting: Physical Medicine & Rehabilitation

## 2012-02-18 ENCOUNTER — Ambulatory Visit: Payer: Medicare Other | Admitting: Critical Care Medicine

## 2012-04-14 ENCOUNTER — Ambulatory Visit (HOSPITAL_BASED_OUTPATIENT_CLINIC_OR_DEPARTMENT_OTHER): Payer: Medicare Other | Admitting: Lab

## 2012-04-14 DIAGNOSIS — N189 Chronic kidney disease, unspecified: Secondary | ICD-10-CM

## 2012-04-14 LAB — CBC WITH DIFFERENTIAL/PLATELET
Basophils Absolute: 0 10*3/uL (ref 0.0–0.1)
HCT: 35.1 % — ABNORMAL LOW (ref 38.4–49.9)
HGB: 11.9 g/dL — ABNORMAL LOW (ref 13.0–17.1)
MONO#: 0.4 10*3/uL (ref 0.1–0.9)
NEUT#: 2.6 10*3/uL (ref 1.5–6.5)
NEUT%: 55.8 % (ref 39.0–75.0)
WBC: 4.7 10*3/uL (ref 4.0–10.3)
lymph#: 1.5 10*3/uL (ref 0.9–3.3)

## 2012-04-14 LAB — COMPREHENSIVE METABOLIC PANEL (CC13)
ALT: 17 U/L (ref 0–55)
Albumin: 3.6 g/dL (ref 3.5–5.0)
BUN: 17 mg/dL (ref 7.0–26.0)
CO2: 21 mEq/L — ABNORMAL LOW (ref 22–29)
Calcium: 9.5 mg/dL (ref 8.4–10.4)
Chloride: 111 mEq/L — ABNORMAL HIGH (ref 98–107)
Creatinine: 1.6 mg/dL — ABNORMAL HIGH (ref 0.7–1.3)

## 2012-04-15 ENCOUNTER — Other Ambulatory Visit: Payer: Medicare Other | Admitting: Lab

## 2012-04-16 LAB — PROTEIN ELECTROPHORESIS, SERUM
Alpha-2-Globulin: 10.8 % (ref 7.1–11.8)
M-Spike, %: 0.81 g/dL
Total Protein, Serum Electrophoresis: 7.2 g/dL (ref 6.0–8.3)

## 2012-04-22 ENCOUNTER — Other Ambulatory Visit: Payer: Medicare Other | Admitting: Lab

## 2012-04-22 ENCOUNTER — Encounter: Payer: Self-pay | Admitting: Physical Medicine & Rehabilitation

## 2012-04-22 ENCOUNTER — Ambulatory Visit (HOSPITAL_BASED_OUTPATIENT_CLINIC_OR_DEPARTMENT_OTHER): Payer: Medicare Other | Admitting: Oncology

## 2012-04-22 ENCOUNTER — Encounter: Payer: Medicare Other | Attending: Physical Medicine & Rehabilitation

## 2012-04-22 ENCOUNTER — Ambulatory Visit (HOSPITAL_BASED_OUTPATIENT_CLINIC_OR_DEPARTMENT_OTHER): Payer: Medicare Other | Admitting: Physical Medicine & Rehabilitation

## 2012-04-22 ENCOUNTER — Telehealth: Payer: Self-pay | Admitting: Oncology

## 2012-04-22 VITALS — BP 134/69 | HR 85 | Temp 97.0°F | Resp 20 | Ht 68.0 in | Wt 172.6 lb

## 2012-04-22 VITALS — BP 150/63 | HR 86 | Resp 14 | Ht 68.0 in | Wt 172.0 lb

## 2012-04-22 DIAGNOSIS — D472 Monoclonal gammopathy: Secondary | ICD-10-CM | POA: Insufficient documentation

## 2012-04-22 DIAGNOSIS — J4489 Other specified chronic obstructive pulmonary disease: Secondary | ICD-10-CM

## 2012-04-22 DIAGNOSIS — Z79899 Other long term (current) drug therapy: Secondary | ICD-10-CM | POA: Insufficient documentation

## 2012-04-22 DIAGNOSIS — J449 Chronic obstructive pulmonary disease, unspecified: Secondary | ICD-10-CM

## 2012-04-22 DIAGNOSIS — M961 Postlaminectomy syndrome, not elsewhere classified: Secondary | ICD-10-CM

## 2012-04-22 DIAGNOSIS — N289 Disorder of kidney and ureter, unspecified: Secondary | ICD-10-CM

## 2012-04-22 DIAGNOSIS — I739 Peripheral vascular disease, unspecified: Secondary | ICD-10-CM | POA: Insufficient documentation

## 2012-04-22 DIAGNOSIS — D649 Anemia, unspecified: Secondary | ICD-10-CM

## 2012-04-22 MED ORDER — TRAMADOL HCL 50 MG PO TABS: 50.0000 mg | ORAL_TABLET | Freq: Three times a day (TID) | ORAL | Status: DC | PRN

## 2012-04-22 MED ORDER — GABAPENTIN 400 MG PO CAPS: 400.0000 mg | ORAL_CAPSULE | Freq: Three times a day (TID) | ORAL | Status: DC

## 2012-04-22 NOTE — Telephone Encounter (Signed)
s.w. pt wife and advised on SEPT OCT appts....mailed appt schedule to pt...sed

## 2012-04-22 NOTE — Progress Notes (Signed)
   [facility]    OFFICE PROGRESS NOTE   INTERVAL HISTORY:   He returns as scheduled. He is followed by Dr. Delford Field for COPD and reports "congestion ". No fever. He has a productive cough.  No night sweats or anorexia.  Objective:  Vital signs in last 24 hours:  Blood pressure 134/69, pulse 85, temperature 97 F (36.1 C), temperature source Oral, resp. rate 20, height 5\' 8"  (1.727 m), weight 172 lb 9.6 oz (78.291 kg).    HEENT: Neck without mass Lymphatics: No cervical, supra-clavicular, axillary, or inguinal nodes Resp: Distant breath sounds, clear bilaterally Cardio: Regular rate and rhythm GI: No hepatosplenomegaly Vascular: No leg edema   Lab Results:  Lab Results  Component Value Date   WBC 4.7 04/14/2012   HGB 11.9* 04/14/2012   HCT 35.1* 04/14/2012   MCV 98.3* 04/14/2012   PLT 158 04/14/2012   ANC 2.6 BUN 17, creatinine 1.6, potassium 5.2, calcium 9.5, albumin 3.6, serum M spike 0.81   Medications: I have reviewed the patient's current medications.  Assessment/Plan: 1. Serum monoclonal protein. Stable. There is no clinical evidence for progression to multiple myeloma or another lymphoproliferative disorder. 2. Chronic renal insufficiency.  Followed by nephrology. 3. Mild anemia, stable.  ? related to chronic renal insufficiency. 4. History of coronary artery disease. 5. History of prostatic hypertrophy.  6. Dementia 7. COPD   Disposition:  He appears stable from a hematologic standpoint. No clinical or laboratory evidence for progression to multiple myeloma or another lymphoproliferative disorder. He will followup with Dr. Delford Field for management of COPD and to discuss influenza/pneumococcal vaccines.  Mr. Anaya will return for an office and lab visit in one year.   Thornton Papas, MD  04/22/2012  12:36 PM

## 2012-04-22 NOTE — Progress Notes (Signed)
Subjective:    Patient ID: [name] [name], male    DOB: 1931/03/29, 78 y.o.   MRN: [id number]  HPI History of lumbar spinal stenosis laminectomy and microdiscectomy L5-S1 on the left side. Has increasing numbness and burning pain in the feet. Has been out of his pain medicine including tramadol 50 mg 3 times a day and gabapentin 400 mg 3 times per day. Goes to hematology for his monoclonal gammopathy. Now with worsening renal function. Has been to nephrologist for this. Pain Inventory Average Pain 4 Pain Right Now 4 My pain is burning  In the last 24 hours, has pain interfered with the following? General activity 3 Relation with others 3 Enjoyment of life 5 What TIME of day is your pain at its worst? It depends Sleep (in general) Good  Pain is worse with: unsure Pain improves with: medication Relief from Meds: 2  Mobility walk without assistance  Function retired  Neuro/Psych anxiety  Prior Studies x-rays  Physicians involved in your care Any changes since last visit?  no   Family History  Problem Relation Age of Onset  . Anesthesia problems Neg Hx   . Hypotension Neg Hx   . Malignant hyperthermia Neg Hx   . Pseudochol deficiency Neg Hx    History   Social History  . Marital Status: Married    Spouse Name: N/A    Number of Children: N/A  . Years of Education: N/A   Social History Main Topics  . Smoking status: Former Smoker -- 0.5 packs/day for 40 years    Types: Cigarettes    Quit date: 07/23/1978  . Smokeless tobacco: Never Used  . Alcohol Use: No  . Drug Use: No  . Sexually Active: None   Other Topics Concern  . None   Social History Narrative  . None   Past Surgical History  Procedure Date  . Coronary artery bypass graft 1997  . Left cea 2002  . Back surgery 2011  . Cataract extraction w/phaco 09/18/2011    Procedure: CATARACT EXTRACTION PHACO AND INTRAOCULAR LENS PLACEMENT (IOC);  Surgeon: Loraine Leriche T. Nile Riggs, MD;  Location: [facility];   Service: Ophthalmology;  Laterality: Right;  CDE: 6.19  . Cardiac surgery    Past Medical History  Diagnosis Date  . Renal insufficiency   . PVD (peripheral vascular disease)   . Hypertension   . Hyperlipemia   . GERD (gastroesophageal reflux disease)   . CAD (coronary artery disease)   . COPD (chronic obstructive pulmonary disease)   . Monoclonal gammopathy   . BPH (benign prostatic hyperplasia)   . DEMENTIA    BP 150/63  Pulse 86  Resp 14  Ht 5\' 8"  (1.727 m)  Wt 172 lb (78.019 kg)  BMI 26.15 kg/m2  SpO2 95%     Review of Systems  Constitutional: Negative.   HENT: Negative.   Eyes: Negative.   Respiratory: Negative.   Cardiovascular: Negative.   Gastrointestinal: Negative.   Genitourinary: Negative.   Musculoskeletal: Positive for back pain.  Skin: Negative.   Neurological: Negative.   Hematological: Negative.   Psychiatric/Behavioral: Negative.        Objective:   Physical Exam  Constitutional: He is oriented to person, place, and time. He appears well-developed and well-nourished.  HENT:  Head: Normocephalic and atraumatic.  Eyes: Conjunctivae normal are normal. Pupils are equal, round, and reactive to light.  Neck: Normal range of motion.  Musculoskeletal:       Lumbar back: He exhibits decreased  range of motion. He exhibits no tenderness, no bony tenderness, no swelling, no edema, no deformity, no pain and no spasm.       Negative straight leg raising test  Neurological: He is alert and oriented to person, place, and time. He has normal strength and normal reflexes. He displays no atrophy. A sensory deficit is present. He exhibits normal muscle tone. Gait normal.  Reflex Scores:      Patellar reflexes are 2+ on the right side and 2+ on the left side.      Achilles reflexes are 2+ on the right side.      Hyperpathia in both feet to pinprick. Light touch sensation is normal  Psychiatric: He has a normal mood and affect.          Assessment & Plan:    1. Lumbar postlaminectomy syndrome. Burning pain in feet may be due to either lumbosacral nerve root compression or monoclonal gammopathy with peripheral neuropathy. Will check EMG/MCV to further evaluate. Will restart tramadol and gabapentin.

## 2012-04-22 NOTE — Patient Instructions (Signed)
Electromyography (EMG) Test This is a test in which very small electrodes are placed into your muscle tissue. It looks at the electrical impulses of your muscle tissue. This test is used to determine whether or not there are involuntary or spontaneous muscle movements. Involuntary or spontaneous means muscle movements that happen by themselves. This may indicate injury or disease of the nerves which supply that muscle. PREPARATION FOR TEST No preparation or fasting is necessary. Some stimulants such as caffeine and tobacco may need to be avoided for 2-3 hours before test or as instructed by your caregiver. NORMAL FINDINGS No evidence of neuromuscular abnormalities. Ranges for normal findings may vary among different laboratories and hospitals. You should always check with your doctor after having lab work or other tests done to discuss the meaning of your test results and whether your values are considered within normal limits. MEANING OF TEST  Your caregiver will go over the test results with you and discuss the importance and meaning of your results, as well as treatment options and the need for additional tests if necessary. OBTAINING THE TEST RESULTS It is your responsibility to obtain your test results. Ask the lab or department performing the test when and how you will get your results. Document Released: 11/09/2004 Document Revised: 10/01/2011 Document Reviewed: 06/18/2008 [employer] Patient Information 2013 [employer].  

## 2012-05-15 ENCOUNTER — Ambulatory Visit: Payer: Medicare Other | Admitting: Physical Medicine & Rehabilitation

## 2012-06-12 ENCOUNTER — Ambulatory Visit (INDEPENDENT_AMBULATORY_CARE_PROVIDER_SITE_OTHER): Payer: Medicare Other | Admitting: Critical Care Medicine

## 2012-06-12 ENCOUNTER — Encounter: Payer: Self-pay | Admitting: Critical Care Medicine

## 2012-06-12 VITALS — BP 124/80 | HR 71 | Temp 97.5°F | Ht 67.0 in | Wt 174.0 lb

## 2012-06-12 DIAGNOSIS — Z23 Encounter for immunization: Secondary | ICD-10-CM

## 2012-06-12 DIAGNOSIS — J449 Chronic obstructive pulmonary disease, unspecified: Secondary | ICD-10-CM

## 2012-06-12 MED ORDER — PREDNISONE 10 MG PO TABS: ORAL_TABLET | ORAL | Status: DC

## 2012-06-12 MED ORDER — AZITHROMYCIN 250 MG PO TABS: 250.0000 mg | ORAL_TABLET | Freq: Every day | ORAL | Status: DC

## 2012-06-12 NOTE — Patient Instructions (Addendum)
Azithromycin 250mg  Take two once then one daily until gone Prednisone 10mg  Take 4 for two days three for two days two for two days one for two days Resume Symbicort two puff twice daily Return 2 months Flu vaccine and pneumovax was given

## 2012-06-12 NOTE — Progress Notes (Signed)
Subjective:    Patient ID: [name] [name], male    DOB: May 28, 1931, 78 y.o.   MRN: [id number]  HPI  History of Present Illness:  Pulmonary OV:  Mr. Valenzano is a 78 y.o.   African American male with history of chronic  obstructive lung disease - gold stage 3, asthmatic bronchitis, hypertension, reflux  disease. Quit smoking in 1994. He has had previous bypass surgery in 1997 Previous chronic chest wall pain after CABG followed at pain clinic.   06/12/2012 Pt noted more dyspnea for one month.  More congested.  Mucus is more productive thick yellow. No blood.  No real chest pain.  NOtes some qhs cough.  Notes more wheezing. Off symbicort for two months.      Past Medical History  Diagnosis Date  . Renal insufficiency   . PVD (peripheral vascular disease)   . Hypertension   . Hyperlipemia   . GERD (gastroesophageal reflux disease)   . CAD (coronary artery disease)   . COPD (chronic obstructive pulmonary disease)   . Monoclonal gammopathy   . BPH (benign prostatic hyperplasia)   . DEMENTIA      Family History  Problem Relation Age of Onset  . Anesthesia problems Neg Hx   . Hypotension Neg Hx   . Malignant hyperthermia Neg Hx   . Pseudochol deficiency Neg Hx      History   Social History  . Marital Status: Married    Spouse Name: N/A    Number of Children: N/A  . Years of Education: N/A   Occupational History  . Not on file.   Social History Main Topics  . Smoking status: Former Smoker -- 0.5 packs/day for 40 years    Types: Cigarettes    Quit date: 07/23/1978  . Smokeless tobacco: Never Used  . Alcohol Use: No  . Drug Use: No  . Sexually Active: Not on file   Other Topics Concern  . Not on file   Social History Narrative  . No narrative on file     No Known Allergies   Outpatient Prescriptions Prior to Visit  Medication Sig Dispense Refill  . ALPRAZolam (XANAX) 0.5 MG tablet Take 0.5 mg by mouth daily as needed. anxiety      . aspirin 325 MG tablet Take  325 mg by mouth daily.        Marland Kitchen dexlansoprazole (DEXILANT) 60 MG capsule Take 60 mg by mouth daily.      . finasteride (PROSCAR) 5 MG tablet Take 5 mg by mouth daily.        Marland Kitchen gabapentin (NEURONTIN) 400 MG capsule Take 1 capsule (400 mg total) by mouth 3 (three) times daily.  90 capsule  2  . galantamine (RAZADYNE ER) 24 MG 24 hr capsule Take 24 mg by mouth daily with breakfast.      . losartan (COZAAR) 50 MG tablet Take 1 tablet (50 mg total) by mouth daily.  30 tablet  6  . lubiprostone (AMITIZA) 24 MCG capsule Take 24 mcg by mouth 2 (two) times daily with a meal.       . mirtazapine (REMERON) 15 MG tablet Take 30 mg by mouth at bedtime.       . Multiple Vitamin (MULTIVITAMIN) tablet Take 1 tablet by mouth daily.        . nitroGLYCERIN (NITROSTAT) 0.4 MG SL tablet Place 0.4 mg under the tongue every 5 (five) minutes as needed. Chest pain      . promethazine (PHENERGAN)  25 MG tablet 1-2 tablets by mouth every 6 hours as needed for nausea      . sodium bicarbonate 325 MG tablet Take 325 mg by mouth 2 (two) times daily.        . Tamsulosin HCl (FLOMAX) 0.4 MG CAPS Take 0.4 mg by mouth daily.        . traMADol (ULTRAM) 50 MG tablet Take 1 tablet (50 mg total) by mouth every 8 (eight) hours as needed. pain  90 tablet  5  . TRAVATAN Z 0.004 % SOLN ophthalmic solution Place 1 drop into both eyes At bedtime.      . vitamin B-12 (CYANOCOBALAMIN) 250 MCG tablet Take 250 mcg by mouth daily.        . budesonide-formoterol (SYMBICORT) 160-4.5 MCG/ACT inhaler Inhale 2 puffs into the lungs 2 (two) times daily.  1 Inhaler  6     Review of Systems  Constitutional:   No  weight loss, night sweats,  Fevers, chills, fatigue, lassitude. HEENT:   No headaches,  Difficulty swallowing,  Tooth/dental problems,  Sore throat,                No sneezing, itching, ear ache, nasal congestion, post nasal drip,   CV:  No chest pain,  Orthopnea, PND, swelling in lower extremities, anasarca, dizziness, palpitations  GI   No heartburn, indigestion, abdominal pain, nausea, vomiting, diarrhea, change in bowel habits, loss of appetite  Resp: Notes  shortness of breath with exertion not  at rest.  No excess mucus, no productive cough,  No non-productive cough,  No coughing up of blood.  No change in color of mucus.  No wheezing.  No chest wall deformity  Skin: no rash or lesions.  GU: no dysuria, change in color of urine, no urgency or frequency.  No flank pain.  MS:  No joint pain or swelling.  No decreased range of motion.  No back pain.  Psych:  No change in mood or affect. No depression or anxiety.  No memory loss.     Objective:   Physical Exam  Filed Vitals:   06/12/12 1610  BP: 124/80  Pulse: 71  Temp: 97.5 F (36.4 C)  TempSrc: Oral  Height: 5\' 7"  (1.702 m)  Weight: 174 lb (78.926 kg)  SpO2: 95%    Gen: Pleasant, well-nourished, in no distress,  normal affect  ENT: No lesions,  mouth clear,  oropharynx clear, no postnasal drip  Neck: No JVD, no TMG, no carotid bruits  Lungs: No use of accessory muscles, no dullness to percussion, distant BS, exp wheeze   Cardiovascular: RRR, heart sounds normal, no murmur or gallops, no peripheral edema  Abdomen: soft and NT, no HSM,  BS normal  Musculoskeletal: No deformities, no cyanosis or clubbing  Neuro: alert, non focal  Skin: Warm, no lesions or rashes        Assessment & Plan:   COPD Golds C Gold stage C. COPD with acute flare Plan Azithromycin 250mg  Take two once then one daily until gone Prednisone 10mg  Take 4 for two days three for two days two for two days one for two days Resume Symbicort two puff twice daily Return 2 months Flu vaccine and pneumovax was given     Updated Medication List Outpatient Encounter Prescriptions as of 06/12/2012  Medication Sig Dispense Refill  . ALPRAZolam (XANAX) 0.5 MG tablet Take 0.5 mg by mouth daily as needed. anxiety      . aspirin 325 MG tablet  Take 325 mg by mouth daily.        Marland Kitchen  dexlansoprazole (DEXILANT) 60 MG capsule Take 60 mg by mouth daily.      . finasteride (PROSCAR) 5 MG tablet Take 5 mg by mouth daily.        Marland Kitchen gabapentin (NEURONTIN) 400 MG capsule Take 1 capsule (400 mg total) by mouth 3 (three) times daily.  90 capsule  2  . galantamine (RAZADYNE ER) 24 MG 24 hr capsule Take 24 mg by mouth daily with breakfast.      . losartan (COZAAR) 50 MG tablet Take 1 tablet (50 mg total) by mouth daily.  30 tablet  6  . lubiprostone (AMITIZA) 24 MCG capsule Take 24 mcg by mouth 2 (two) times daily with a meal.       . mirtazapine (REMERON) 15 MG tablet Take 30 mg by mouth at bedtime.       . Multiple Vitamin (MULTIVITAMIN) tablet Take 1 tablet by mouth daily.        . nitroGLYCERIN (NITROSTAT) 0.4 MG SL tablet Place 0.4 mg under the tongue every 5 (five) minutes as needed. Chest pain      . promethazine (PHENERGAN) 25 MG tablet 1-2 tablets by mouth every 6 hours as needed for nausea      . sodium bicarbonate 325 MG tablet Take 325 mg by mouth 2 (two) times daily.        . Tamsulosin HCl (FLOMAX) 0.4 MG CAPS Take 0.4 mg by mouth daily.        . traMADol (ULTRAM) 50 MG tablet Take 1 tablet (50 mg total) by mouth every 8 (eight) hours as needed. pain  90 tablet  5  . TRAVATAN Z 0.004 % SOLN ophthalmic solution Place 1 drop into both eyes At bedtime.      . vitamin B-12 (CYANOCOBALAMIN) 250 MCG tablet Take 250 mcg by mouth daily.        Marland Kitchen azithromycin (ZITHROMAX) 250 MG tablet Take 1 tablet (250 mg total) by mouth daily. Take two once then one daily until gone  6 each  0  . budesonide-formoterol (SYMBICORT) 160-4.5 MCG/ACT inhaler Inhale 2 puffs into the lungs 2 (two) times daily.  1 Inhaler  6  . predniSONE (DELTASONE) 10 MG tablet Take 4 for two days three for two days two for two days one for two days  20 tablet  0

## 2012-06-13 NOTE — Assessment & Plan Note (Signed)
Gold stage C. COPD with acute flare Plan Azithromycin 250mg  Take two once then one daily until gone Prednisone 10mg  Take 4 for two days three for two days two for two days one for two days Resume Symbicort two puff twice daily Return 2 months Flu vaccine and pneumovax was given

## 2012-07-28 ENCOUNTER — Encounter: Payer: Self-pay | Admitting: Vascular Surgery

## 2012-08-11 ENCOUNTER — Ambulatory Visit: Payer: Medicare Other | Admitting: Physical Medicine & Rehabilitation

## 2012-08-12 ENCOUNTER — Ambulatory Visit (INDEPENDENT_AMBULATORY_CARE_PROVIDER_SITE_OTHER): Payer: Medicare Other | Admitting: Critical Care Medicine

## 2012-08-12 ENCOUNTER — Encounter: Payer: Self-pay | Admitting: Critical Care Medicine

## 2012-08-12 VITALS — BP 124/72 | HR 80 | Temp 97.7°F | Ht 67.0 in | Wt 175.0 lb

## 2012-08-12 DIAGNOSIS — J449 Chronic obstructive pulmonary disease, unspecified: Secondary | ICD-10-CM

## 2012-08-12 MED ORDER — ALBUTEROL SULFATE HFA 108 (90 BASE) MCG/ACT IN AERS: 2.0000 | INHALATION_SPRAY | Freq: Four times a day (QID) | RESPIRATORY_TRACT | Status: DC | PRN

## 2012-08-12 NOTE — Progress Notes (Signed)
Subjective:    Patient ID: [name] [name], male    DOB: [birth year], 79 y.o.   MRN: [id number]  HPI  History of Present Illness:  Pulmonary OV:  [name] [name] is a 79 y.o.   African American male with history of chronic  obstructive lung disease - gold stage 3, asthmatic bronchitis, hypertension, reflux  disease. Quit smoking in 1994. He has had previous bypass surgery in 1997 Previous chronic chest wall pain after CABG followed at pain clinic.   06/12/2012 Pt noted more dyspnea for one month.  More congested.  Mucus is more productive thick yellow. No blood.  No real chest pain.  NOtes some qhs cough.  Notes more wheezing. Off symbicort for two months.    08/12/2012 Dyspnea is ok.  No real cough. Notes some wheezing. Notes some phlegm in throat area. No real chest pain.  Notes some edema in the feet.  No real heartburn.  Dyspnea only with exertion. NOte HFA technique at 10% pt reinstructed as to proper technique to the 70% level     Past Medical History  Diagnosis Date  . Renal insufficiency   . PVD (peripheral vascular disease)   . Hypertension   . Hyperlipemia   . GERD (gastroesophageal reflux disease)   . CAD (coronary artery disease)   . COPD (chronic obstructive pulmonary disease)   . Monoclonal gammopathy   . BPH (benign prostatic hyperplasia)   . DEMENTIA      Family History  Problem Relation Age of Onset  . Anesthesia problems Neg Hx   . Hypotension Neg Hx   . Malignant hyperthermia Neg Hx   . Pseudochol deficiency Neg Hx      History   Social History  . Marital Status: Married    Spouse Name: N/A    Number of Children: N/A  . Years of Education: N/A   Occupational History  . Not on file.   Social History Main Topics  . Smoking status: Former Smoker -- 0.5 packs/day for 40 years    Types: Cigarettes    Quit date: 07/23/1978  . Smokeless tobacco: Never Used  . Alcohol Use: No  . Drug Use: No  . Sexually Active: Not on file   Other Topics Concern  .  Not on file   Social History Narrative  . No narrative on file     No Known Allergies   Outpatient Prescriptions Prior to Visit  Medication Sig Dispense Refill  . ALPRAZolam (XANAX) 0.5 MG tablet Take 0.5 mg by mouth daily as needed. anxiety      . aspirin 325 MG tablet Take 325 mg by mouth daily.        . budesonide-formoterol (SYMBICORT) 160-4.5 MCG/ACT inhaler Inhale 2 puffs into the lungs 2 (two) times daily.  1 Inhaler  6  . dexlansoprazole (DEXILANT) 60 MG capsule Take 60 mg by mouth daily.      . finasteride (PROSCAR) 5 MG tablet Take 5 mg by mouth daily.        Marland Kitchen gabapentin (NEURONTIN) 400 MG capsule Take 1 capsule (400 mg total) by mouth 3 (three) times daily.  90 capsule  2  . galantamine (RAZADYNE ER) 24 MG 24 hr capsule Take 24 mg by mouth daily with breakfast.      . losartan (COZAAR) 50 MG tablet Take 1 tablet (50 mg total) by mouth daily.  30 tablet  6  . lubiprostone (AMITIZA) 24 MCG capsule Take 24 mcg by mouth 2 (two) times  daily with a meal.       . Multiple Vitamin (MULTIVITAMIN) tablet Take 1 tablet by mouth daily.        . nitroGLYCERIN (NITROSTAT) 0.4 MG SL tablet Place 0.4 mg under the tongue every 5 (five) minutes as needed. Chest pain      . promethazine (PHENERGAN) 25 MG tablet 1-2 tablets by mouth every 6 hours as needed for nausea      . sodium bicarbonate 325 MG tablet Take 325 mg by mouth 2 (two) times daily.        . Tamsulosin HCl (FLOMAX) 0.4 MG CAPS Take 0.4 mg by mouth daily.        . traMADol (ULTRAM) 50 MG tablet Take 1 tablet (50 mg total) by mouth every 8 (eight) hours as needed. pain  90 tablet  5  . TRAVATAN Z 0.004 % SOLN ophthalmic solution Place 1 drop into both eyes At bedtime.      . vitamin B-12 (CYANOCOBALAMIN) 250 MCG tablet Take 250 mcg by mouth daily.        . mirtazapine (REMERON) 15 MG tablet Take 30 mg by mouth at bedtime.       . [DISCONTINUED] azithromycin (ZITHROMAX) 250 MG tablet Take 1 tablet (250 mg total) by mouth daily. Take  two once then one daily until gone  6 each  0  . [DISCONTINUED] predniSONE (DELTASONE) 10 MG tablet Take 4 for two days three for two days two for two days one for two days  20 tablet  0  Last reviewed on 08/12/2012  2:17 PM by Storm Frisk, MD   Review of Systems  Constitutional:   No  weight loss, night sweats,  Fevers, chills, fatigue, lassitude. HEENT:   No headaches,  Difficulty swallowing,  Tooth/dental problems,  Sore throat,                No sneezing, itching, ear ache, nasal congestion, post nasal drip,   CV:  No chest pain,  Orthopnea, PND, swelling in lower extremities, anasarca, dizziness, palpitations  GI  No heartburn, indigestion, abdominal pain, nausea, vomiting, diarrhea, change in bowel habits, loss of appetite  Resp: Notes  shortness of breath with exertion not  at rest.  No excess mucus, no productive cough,  No non-productive cough,  No coughing up of blood.  No change in color of mucus.  No wheezing.  No chest wall deformity  Skin: no rash or lesions.  GU: no dysuria, change in color of urine, no urgency or frequency.  No flank pain.  MS:  No joint pain or swelling.  No decreased range of motion.  No back pain.  Psych:  No change in mood or affect. No depression or anxiety.  No memory loss.     Objective:   Physical Exam  Filed Vitals:   08/12/12 1400  BP: 124/72  Pulse: 80  Temp: 97.7 F (36.5 C)  TempSrc: Oral  Height: 5\' 7"  (1.702 m)  Weight: 175 lb (79.379 kg)  SpO2: 97%    Gen: Pleasant, well-nourished, in no distress,  normal affect  ENT: No lesions,  mouth clear,  oropharynx clear, no postnasal drip  Neck: No JVD, no TMG, no carotid bruits  Lungs: No use of accessory muscles, no dullness to percussion, distant BS, exp wheeze   Cardiovascular: RRR, heart sounds normal, no murmur or gallops, no peripheral edema  Abdomen: soft and NT, no HSM,  BS normal  Musculoskeletal: No deformities, no cyanosis  or clubbing  Neuro: alert, non  focal  Skin: Warm, no lesions or rashes        Assessment & Plan:   COPD Golds C Gold C Copd , poor HFA technique Plan Pt reinstructed as to proper technique  Cont symbicort two puff bid Prn SABA    Updated Medication List Outpatient Encounter Prescriptions as of 08/12/2012  Medication Sig Dispense Refill  . ALPRAZolam (XANAX) 0.5 MG tablet Take 0.5 mg by mouth daily as needed. anxiety      . aspirin 325 MG tablet Take 325 mg by mouth daily.        . budesonide-formoterol (SYMBICORT) 160-4.5 MCG/ACT inhaler Inhale 2 puffs into the lungs 2 (two) times daily.  1 Inhaler  6  . dexlansoprazole (DEXILANT) 60 MG capsule Take 60 mg by mouth daily.      . finasteride (PROSCAR) 5 MG tablet Take 5 mg by mouth daily.        Marland Kitchen gabapentin (NEURONTIN) 400 MG capsule Take 1 capsule (400 mg total) by mouth 3 (three) times daily.  90 capsule  2  . galantamine (RAZADYNE ER) 24 MG 24 hr capsule Take 24 mg by mouth daily with breakfast.      . losartan (COZAAR) 50 MG tablet Take 1 tablet (50 mg total) by mouth daily.  30 tablet  6  . lubiprostone (AMITIZA) 24 MCG capsule Take 24 mcg by mouth 2 (two) times daily with a meal.       . Multiple Vitamin (MULTIVITAMIN) tablet Take 1 tablet by mouth daily.        . nitroGLYCERIN (NITROSTAT) 0.4 MG SL tablet Place 0.4 mg under the tongue every 5 (five) minutes as needed. Chest pain      . promethazine (PHENERGAN) 25 MG tablet 1-2 tablets by mouth every 6 hours as needed for nausea      . sodium bicarbonate 325 MG tablet Take 325 mg by mouth 2 (two) times daily.        . Tamsulosin HCl (FLOMAX) 0.4 MG CAPS Take 0.4 mg by mouth daily.        . traMADol (ULTRAM) 50 MG tablet Take 1 tablet (50 mg total) by mouth every 8 (eight) hours as needed. pain  90 tablet  5  . TRAVATAN Z 0.004 % SOLN ophthalmic solution Place 1 drop into both eyes At bedtime.      . vitamin B-12 (CYANOCOBALAMIN) 250 MCG tablet Take 250 mcg by mouth daily.        Marland Kitchen albuterol (PROVENTIL  HFA;VENTOLIN HFA) 108 (90 BASE) MCG/ACT inhaler Inhale 2 puffs into the lungs every 6 (six) hours as needed for wheezing.  1 Inhaler  6  . mirtazapine (REMERON) 15 MG tablet Take 30 mg by mouth at bedtime.       . [DISCONTINUED] albuterol (PROVENTIL HFA;VENTOLIN HFA) 108 (90 BASE) MCG/ACT inhaler Inhale 2 puffs into the lungs every 6 (six) hours as needed for wheezing.  1 Inhaler  6  . [DISCONTINUED] azithromycin (ZITHROMAX) 250 MG tablet Take 1 tablet (250 mg total) by mouth daily. Take two once then one daily until gone  6 each  0  . [DISCONTINUED] predniSONE (DELTASONE) 10 MG tablet Take 4 for two days three for two days two for two days one for two days  20 tablet  0

## 2012-08-12 NOTE — Patient Instructions (Signed)
Stay on symbicort two puff twice daily Use Ventolin as needed 1-2 puff every 4-6 hours Return 4 months

## 2012-08-12 NOTE — Assessment & Plan Note (Signed)
Gold C Copd , poor HFA technique Plan Pt reinstructed as to proper technique  Cont symbicort two puff bid Prn SABA

## 2012-10-30 ENCOUNTER — Encounter: Payer: Self-pay | Admitting: Oncology

## 2012-11-05 ENCOUNTER — Encounter (HOSPITAL_COMMUNITY): Payer: Self-pay

## 2012-11-05 ENCOUNTER — Emergency Department (HOSPITAL_COMMUNITY)
Admission: EM | Admit: 2012-11-05 | Discharge: 2012-11-05 | Disposition: A | Discharge status: Home / Self Care | Payer: Medicare Other | Attending: Emergency Medicine | Admitting: Emergency Medicine

## 2012-11-05 ENCOUNTER — Emergency Department (HOSPITAL_COMMUNITY): Payer: Medicare Other

## 2012-11-05 DIAGNOSIS — J449 Chronic obstructive pulmonary disease, unspecified: Secondary | ICD-10-CM | POA: Insufficient documentation

## 2012-11-05 DIAGNOSIS — I2510 Atherosclerotic heart disease of native coronary artery without angina pectoris: Secondary | ICD-10-CM | POA: Insufficient documentation

## 2012-11-05 DIAGNOSIS — R109 Unspecified abdominal pain: Secondary | ICD-10-CM | POA: Insufficient documentation

## 2012-11-05 DIAGNOSIS — I10 Essential (primary) hypertension: Secondary | ICD-10-CM | POA: Insufficient documentation

## 2012-11-05 DIAGNOSIS — K219 Gastro-esophageal reflux disease without esophagitis: Secondary | ICD-10-CM | POA: Insufficient documentation

## 2012-11-05 DIAGNOSIS — Z87448 Personal history of other diseases of urinary system: Secondary | ICD-10-CM | POA: Insufficient documentation

## 2012-11-05 DIAGNOSIS — F0390 Unspecified dementia without behavioral disturbance: Secondary | ICD-10-CM | POA: Insufficient documentation

## 2012-11-05 DIAGNOSIS — R112 Nausea with vomiting, unspecified: Secondary | ICD-10-CM | POA: Insufficient documentation

## 2012-11-05 DIAGNOSIS — Z79899 Other long term (current) drug therapy: Secondary | ICD-10-CM | POA: Insufficient documentation

## 2012-11-05 DIAGNOSIS — I739 Peripheral vascular disease, unspecified: Secondary | ICD-10-CM | POA: Insufficient documentation

## 2012-11-05 DIAGNOSIS — N400 Enlarged prostate without lower urinary tract symptoms: Secondary | ICD-10-CM | POA: Insufficient documentation

## 2012-11-05 DIAGNOSIS — E785 Hyperlipidemia, unspecified: Secondary | ICD-10-CM | POA: Insufficient documentation

## 2012-11-05 DIAGNOSIS — Z951 Presence of aortocoronary bypass graft: Secondary | ICD-10-CM | POA: Insufficient documentation

## 2012-11-05 DIAGNOSIS — J4489 Other specified chronic obstructive pulmonary disease: Secondary | ICD-10-CM | POA: Insufficient documentation

## 2012-11-05 DIAGNOSIS — Z7982 Long term (current) use of aspirin: Secondary | ICD-10-CM | POA: Insufficient documentation

## 2012-11-05 DIAGNOSIS — Z87891 Personal history of nicotine dependence: Secondary | ICD-10-CM | POA: Insufficient documentation

## 2012-11-05 DIAGNOSIS — R630 Anorexia: Secondary | ICD-10-CM | POA: Insufficient documentation

## 2012-11-05 DIAGNOSIS — R300 Dysuria: Secondary | ICD-10-CM | POA: Insufficient documentation

## 2012-11-05 LAB — CBC WITH DIFFERENTIAL/PLATELET
Basophils Absolute: 0 10*3/uL (ref 0.0–0.1)
Basophils Relative: 0 % (ref 0–1)
Eosinophils Absolute: 0 10*3/uL (ref 0.0–0.7)
Eosinophils Relative: 1 % (ref 0–5)
HCT: 37.3 % — ABNORMAL LOW (ref 39.0–52.0)
MCHC: 34.6 g/dL (ref 30.0–36.0)
MCV: 94.7 fL (ref 78.0–100.0)
Monocytes Absolute: 0.2 10*3/uL (ref 0.1–1.0)
RDW: 13.4 % (ref 11.5–15.5)

## 2012-11-05 LAB — URINALYSIS, ROUTINE W REFLEX MICROSCOPIC
Glucose, UA: NEGATIVE mg/dL
Leukocytes, UA: NEGATIVE
Protein, ur: NEGATIVE mg/dL
pH: 6.5 (ref 5.0–8.0)

## 2012-11-05 LAB — COMPREHENSIVE METABOLIC PANEL
AST: 23 U/L (ref 0–37)
Albumin: 3.8 g/dL (ref 3.5–5.2)
Calcium: 9.9 mg/dL (ref 8.4–10.5)
Creatinine, Ser: 1.62 mg/dL — ABNORMAL HIGH (ref 0.50–1.35)
GFR calc non Af Amer: 39 mL/min — ABNORMAL LOW (ref >90)
Total Protein: 8.6 g/dL — ABNORMAL HIGH (ref 6.0–8.3)

## 2012-11-05 LAB — LACTIC ACID, PLASMA: Lactic Acid, Venous: 2.6 mmol/L — ABNORMAL HIGH (ref 0.5–2.2)

## 2012-11-05 MED ORDER — ONDANSETRON HCL 4 MG PO TABS: 4.0000 mg | ORAL_TABLET | Freq: Four times a day (QID) | ORAL | Status: DC

## 2012-11-05 MED ORDER — IOHEXOL 300 MG/ML  SOLN
50.0000 mL | Freq: Once | INTRAMUSCULAR | Status: AC | PRN
  Administered 2012-11-05: 50 mL via ORAL

## 2012-11-05 MED ORDER — ONDANSETRON HCL 4 MG/2ML IJ SOLN
4.0000 mg | Freq: Once | INTRAMUSCULAR | Status: AC
  Administered 2012-11-05: 4 mg via INTRAVENOUS
  Filled 2012-11-05: qty 2

## 2012-11-05 MED ORDER — SODIUM CHLORIDE 0.9 % IV SOLN
Freq: Once | INTRAVENOUS | Status: AC
  Administered 2012-11-05: 1000 mL via INTRAVENOUS

## 2012-11-05 MED ORDER — PANTOPRAZOLE SODIUM 40 MG IV SOLR
40.0000 mg | Freq: Once | INTRAVENOUS | Status: AC
  Administered 2012-11-05: 40 mg via INTRAVENOUS
  Filled 2012-11-05: qty 40

## 2012-11-05 MED ORDER — IOHEXOL 300 MG/ML  SOLN
100.0000 mL | Freq: Once | INTRAMUSCULAR | Status: AC | PRN
  Administered 2012-11-05: 100 mL via INTRAVENOUS

## 2012-11-05 NOTE — ED Notes (Signed)
Pt reports n/v off and on for the past  Month.  Reports symptoms came back 2 days ago.  Also reports diarrhea x 1 week.  Denies any fever or abd pain.

## 2012-11-05 NOTE — ED Provider Notes (Signed)
History  This chart was scribed for Glynn Octave, MD by Quintella Reichert and Shari Heritage, ED Scribes.  This patient was seen in room [facility] and the patient's care was started at 11:47 AM.   CSN: [id number]  Arrival date & time 11/05/12  1114    Chief Complaint  Patient presents with  . Nausea  . Emesis     The history is provided by the patient. No language interpreter was used.    HPI Comments: [name] [name] is a 79 y.o. male who presents to the Emergency Department complaining of intermittent nausea for the past month. He states that 2 days ago, he began having worsening, persistent nausea and today he started to have vomiting. He has also had multiple episodes of diarrhea daily for the past week. Other associated symptoms include decreased appetite and decreased urination.  He states current symptoms are distinct from past GERD symptoms. Pt denies hematochezia, lightheadedness, dizziness, abdominal pain, fever, difficulty urinating, or testicular pain. He denies any sick contacts. He has h/o HTN,  COPD, glaucoma, stage 3 kidney disease, GERD, and monoclonal gammopathy. Patient has a history of CABG and says he has baseline soreness at surgical incision site. He is not on chemotherapy.  He has no h/o IBS diagnosis.  PCP is Dr. Azucena Cecil. Gastroenterologist is Dr. Randa Evens.   Past Medical History  Diagnosis Date  . Renal insufficiency   . PVD (peripheral vascular disease)   . Hypertension   . Hyperlipemia   . GERD (gastroesophageal reflux disease)   . CAD (coronary artery disease)   . COPD (chronic obstructive pulmonary disease)   . Monoclonal gammopathy   . BPH (benign prostatic hyperplasia)   . DEMENTIA     Past Surgical History  Procedure Laterality Date  . Coronary artery bypass graft  1997  . Left cea  2002  . Back surgery  2011  . Cataract extraction w/phaco  09/18/2011    Procedure: CATARACT EXTRACTION PHACO AND INTRAOCULAR LENS PLACEMENT (IOC);  Surgeon: Loraine Leriche  T. Nile Riggs, MD;  Location: [facility];  Service: Ophthalmology;  Laterality: Right;  CDE: 6.19  . Cardiac surgery      Family History  Problem Relation Age of Onset  . Anesthesia problems Neg Hx   . Hypotension Neg Hx   . Malignant hyperthermia Neg Hx   . Pseudochol deficiency Neg Hx     History  Substance Use Topics  . Smoking status: Former Smoker -- 0.50 packs/day for 40 years    Types: Cigarettes    Quit date: 07/23/1978  . Smokeless tobacco: Never Used  . Alcohol Use: No      Review of Systems A complete 10 system review of systems was obtained and all systems are negative except as noted in the HPI and PMH.    Allergies  Review of patient's allergies indicates no known allergies.  Home Medications   Current Outpatient Rx  Name  Route  Sig  Dispense  Refill  . albuterol (PROVENTIL HFA;VENTOLIN HFA) 108 (90 BASE) MCG/ACT inhaler   Inhalation   Inhale 2 puffs into the lungs every 6 (six) hours as needed for wheezing.   1 Inhaler   6   . ALPRAZolam (XANAX) 0.5 MG tablet   Oral   Take 0.5 mg by mouth daily as needed. anxiety         . aspirin 325 MG tablet   Oral   Take 325 mg by mouth daily.           Marland Kitchen  budesonide-formoterol (SYMBICORT) 160-4.5 MCG/ACT inhaler   Inhalation   Inhale 2 puffs into the lungs 2 (two) times daily.   1 Inhaler   6   . dexlansoprazole (DEXILANT) 60 MG capsule   Oral   Take 60 mg by mouth daily.         . finasteride (PROSCAR) 5 MG tablet   Oral   Take 5 mg by mouth daily.           Marland Kitchen gabapentin (NEURONTIN) 400 MG capsule   Oral   Take 1 capsule (400 mg total) by mouth 3 (three) times daily.   90 capsule   2   . galantamine (RAZADYNE ER) 24 MG 24 hr capsule   Oral   Take 24 mg by mouth daily with breakfast.         . EXPIRED: losartan (COZAAR) 50 MG tablet   Oral   Take 1 tablet (50 mg total) by mouth daily.   30 tablet   6   . lubiprostone (AMITIZA) 24 MCG capsule   Oral   Take 24 mcg by mouth 2 (two)  times daily with a meal.          . mirtazapine (REMERON) 15 MG tablet   Oral   Take 30 mg by mouth at bedtime.          . Multiple Vitamin (MULTIVITAMIN) tablet   Oral   Take 1 tablet by mouth daily.           . nitroGLYCERIN (NITROSTAT) 0.4 MG SL tablet   Sublingual   Place 0.4 mg under the tongue every 5 (five) minutes as needed. Chest pain         . promethazine (PHENERGAN) 25 MG tablet      1-2 tablets by mouth every 6 hours as needed for nausea         . sodium bicarbonate 325 MG tablet   Oral   Take 325 mg by mouth 2 (two) times daily.           . Tamsulosin HCl (FLOMAX) 0.4 MG CAPS   Oral   Take 0.4 mg by mouth daily.           . traMADol (ULTRAM) 50 MG tablet   Oral   Take 1 tablet (50 mg total) by mouth every 8 (eight) hours as needed. pain   90 tablet   5   . TRAVATAN Z 0.004 % SOLN ophthalmic solution   Both Eyes   Place 1 drop into both eyes At bedtime.         . vitamin B-12 (CYANOCOBALAMIN) 250 MCG tablet   Oral   Take 250 mcg by mouth daily.             Triage Vitals: BP 178/73  Pulse 77  Temp(Src) 97.6 F (36.4 C) (Oral)  Resp 20  Ht 5\' 8"  (1.727 m)  Wt 178 lb (80.74 kg)  BMI 27.07 kg/m2  SpO2 99%  Physical Exam  Nursing note and vitals reviewed. Constitutional: He is oriented to person, place, and time. He appears well-developed. No distress.  HENT:  Head: Normocephalic and atraumatic.  Dry mucous membranes  Eyes: Conjunctivae are normal. Pupils are equal, round, and reactive to light.  Neck: Normal range of motion. Neck supple.  Cardiovascular: Normal rate and regular rhythm.   No murmur heard. Pulmonary/Chest: Effort normal and breath sounds normal.  Abdominal: Soft. There is no tenderness. There is no rebound.  Musculoskeletal: Normal  range of motion. He exhibits no edema.  Neurological: He is alert and oriented to person, place, and time.  Skin: Skin is warm and dry.    ED Course  Procedures (including  critical care time)  DIAGNOSTIC STUDIES: Oxygen Saturation is 99% on room air, normal by my interpretation.    COORDINATION OF CARE: 11:42 AM-Discussed treatment plan which includes IV fluids, anti-emetics, imaging and lab with pt at bedside and pt agreed to plan.   1:38 PM- Updated patient on labs and imaging results which are unremarkable. Patient has had no vomiting in the ED.   Labs Reviewed  CBC WITH DIFFERENTIAL - Abnormal; Notable for the following:    RBC 3.94 (*)    Hemoglobin 12.9 (*)    HCT 37.3 (*)    All other components within normal limits  COMPREHENSIVE METABOLIC PANEL - Abnormal; Notable for the following:    Glucose, Bld 117 (*)    Creatinine, Ser 1.62 (*)    Total Protein 8.6 (*)    GFR calc non Af Amer 39 (*)    GFR calc Af Amer 45 (*)    All other components within normal limits  URINALYSIS, ROUTINE W REFLEX MICROSCOPIC - Abnormal; Notable for the following:    Specific Gravity, Urine <1.005 (*)    All other components within normal limits  LACTIC ACID, PLASMA - Abnormal; Notable for the following:    Lactic Acid, Venous 2.6 (*)    All other components within normal limits  LIPASE, BLOOD  TROPONIN I    Dg Abd Acute W/chest  11/05/2012  *RADIOLOGY REPORT*  Clinical Data: Vomiting.  Nausea.  ACUTE ABDOMEN SERIES (ABDOMEN 2 VIEW & CHEST 1 VIEW)  Comparison: 10/17/2010.  Findings: Unchanged emphysema and pulmonary parenchymal scarring. CABG.  No airspace disease.  No free air underneath the hemidiaphragms.  Vascular stents are noted.  The bowel gas pattern is within normal limits.  Stool and bowel gas extends to the rectosigmoid.  No dilated loops of large or small bowel.  No pathologic air fluid levels.  IMPRESSION: 1.  Chronic changes of the chest. 2.  Normal bowel gas pattern without acute abnormality.   Original Report Authenticated By: Andreas Newport, M.D.      No diagnosis found.    MDM  Nausea for the past month it is worse in the past 2 days  associated with 4 episodes of nonbloody nonbilious emesis. Also loose stools for one week. No abdominal pain, fever, recent antibiotic use or travel. No dizziness, lightheadedness, chest pain or shortness of breath  Creatinine at baseline.  UA negative for ketones. Lactate 2.4, improved to 1.1 after hydration. Labs otherwise unremarkable. AAS clear.  No acute findings on CT scan. Suspect patient's vomiting is secondary to his acid reflux disease. He has not had any vomiting in the ED and is tolerating by mouth liquids. He has no abdominal pain. He is already on a PPI and sodium bicarbonate. Advise follow up with his gastroenterologist Dr. Randa Evens evaluation and likely repeat endoscopy.   Date: 11/05/2012  Rate: 68  Rhythm: normal sinus rhythm  QRS Axis: normal  Intervals: normal  ST/T Wave abnormalities: normal  Conduction Disutrbances:none  Narrative Interpretation:   Old EKG Reviewed: unchanged    I personally performed the services described in this documentation, which was scribed in my presence. The recorded information has been reviewed and is accurate.     Glynn Octave, MD 11/05/12 [phone]

## 2012-11-13 ENCOUNTER — Encounter: Payer: Self-pay | Admitting: Neurology

## 2012-11-13 ENCOUNTER — Ambulatory Visit (INDEPENDENT_AMBULATORY_CARE_PROVIDER_SITE_OTHER): Payer: Medicare Other | Admitting: Neurology

## 2012-11-13 VITALS — BP 143/73 | HR 77 | Ht 67.0 in | Wt 173.0 lb

## 2012-11-13 DIAGNOSIS — K219 Gastro-esophageal reflux disease without esophagitis: Secondary | ICD-10-CM

## 2012-11-13 DIAGNOSIS — G309 Alzheimer's disease, unspecified: Secondary | ICD-10-CM

## 2012-11-13 DIAGNOSIS — F0280 Dementia in other diseases classified elsewhere without behavioral disturbance: Secondary | ICD-10-CM

## 2012-11-13 DIAGNOSIS — R413 Other amnesia: Secondary | ICD-10-CM | POA: Insufficient documentation

## 2012-11-13 DIAGNOSIS — I739 Peripheral vascular disease, unspecified: Secondary | ICD-10-CM | POA: Insufficient documentation

## 2012-11-13 DIAGNOSIS — I10 Essential (primary) hypertension: Secondary | ICD-10-CM

## 2012-11-13 DIAGNOSIS — I2510 Atherosclerotic heart disease of native coronary artery without angina pectoris: Secondary | ICD-10-CM | POA: Insufficient documentation

## 2012-11-13 DIAGNOSIS — J449 Chronic obstructive pulmonary disease, unspecified: Secondary | ICD-10-CM

## 2012-11-13 DIAGNOSIS — J4489 Other specified chronic obstructive pulmonary disease: Secondary | ICD-10-CM

## 2012-11-13 MED ORDER — GALANTAMINE HYDROBROMIDE ER 24 MG PO CP24: 24.0000 mg | ORAL_CAPSULE | Freq: Every day | ORAL | Status: DC

## 2012-11-13 MED ORDER — ALPRAZOLAM 0.5 MG PO TABS: 0.5000 mg | ORAL_TABLET | Freq: Every day | ORAL | Status: DC | PRN

## 2012-11-13 NOTE — Progress Notes (Signed)
History of Present Illness  [name] [name] is a 79 years old right-handed African American male, accompanied by [name] [name] at today's clinical visit, he was a patient of Dr. 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 Some anxiety, improved on Remeron and prn Xanax.   He is occasionally irritable and easily provoked to anger. [name]  says that he has decreased appetite recently.  UPDATE April 24th 2014:  He continues to complain mild slow worsening memory trouble, still very active at home, last visit Mini-Mental Status Examination was 14 of 30 in June 2013, today Mini-Mental Status is 22 out of 30, he wants more aggressive treatment, I have discussed with him the LZAX trial, he decided to proceed with it   Review of Systems  Out of a complete 14 system review, the patient complains of only the following symptoms, and all other reviewed systems are negative.   Constitutional:   Weight loss Cardiovascular:  Chest pain Ear/Nose/Throat:  N/A Skin: N/A Eyes: N/A Respiratory: Shortness of breath, cough, wheezing,  Gastroitestinal: Diarrhea, incontinence Hematology/Lymphatic:  Anemia Endocrine:  Feeling cold Musculoskeletal:N/A Allergy/Immunology: running nose Neurological:  Memory loss, numbness confusion Psychiatric:  Anxiety, decreased energy    Physical Exam  Neck: supple no carotid bruits Respiratory: clear to auscultation bilaterally Cardiovascular: regular rate rhythm  Neurologic Exam  Mental Status: pleasant, awake, alert, cooperative to history, talking, and casual conversation. Mini-Mental Status Examination 22 of 30,  has difficulty spell world backward, concentrate,  missed two out of three recalls, could not copy design. Cranial Nerves: CN II-XII pupils were equal round reactive to light.   Extraocular movements were full.  Visual fields were full on confrontational test.  Facial sensation and strength were normal.  Hearing was intact to finger rubbing bilaterally.  Uvula tongue were midline.  Head turning and shoulder shrugging were normal and symmetric.  Tongue protrusion into the cheeks strength were normal.  Motor: Normal tone, bulk, and strength. Sensory: Normal to light touch Coordination: Normal finger-to-nose, heel-to-shin.  There was no dysmetria noticed. Gait and Station: Narrow based and steady  Romberg sign: Negative Reflexes: Deep tendon reflexes:present and symmetric.  Plantar responses are flexor.   Assessment and Plan: [name] [name] is a 79 year old, right handed, African-American male with history of memory loss. [name] neuropsychologic testing in 2009 indicated dementia. He has been taking and tolerating Razadyne ER.   MMSE score today 22/30  1, Refill [name] meds. 2. have discussed with him and [name] [name] about LZAX trial, he decided to proceed with that

## 2012-11-21 ENCOUNTER — Ambulatory Visit (INDEPENDENT_AMBULATORY_CARE_PROVIDER_SITE_OTHER): Payer: Medicare Other | Admitting: *Deleted

## 2012-11-21 DIAGNOSIS — Z0289 Encounter for other administrative examinations: Secondary | ICD-10-CM

## 2012-11-21 DIAGNOSIS — F0280 Dementia in other diseases classified elsewhere without behavioral disturbance: Secondary | ICD-10-CM

## 2012-11-21 DIAGNOSIS — G309 Alzheimer's disease, unspecified: Secondary | ICD-10-CM

## 2012-11-21 NOTE — Progress Notes (Signed)
Patient was seen today for interest in the EXPEDITION 3 trial. Patient and wife were given the EXPEDITION 3 inform consent on a previous office visit with Dr. Terrace Arabia. No study related procedures were performed prior to the consent process. Patient and wife signed the informed consent and copy was given to patient for personal record. Mood Scale was done. MMSE was 22 on exam. Modified Hachinski was performed by Dr. Pearlean Brownie. ECG was performed. Labs with urine sample was collected. Dr. Pearlean Brownie performed the neuro and physical exam.

## 2012-11-25 ENCOUNTER — Other Ambulatory Visit: Payer: Self-pay | Admitting: Gastroenterology

## 2012-11-25 DIAGNOSIS — R110 Nausea: Secondary | ICD-10-CM

## 2012-12-01 ENCOUNTER — Other Ambulatory Visit: Payer: Medicare Other

## 2012-12-04 ENCOUNTER — Ambulatory Visit
Admission: RE | Admit: 2012-12-04 | Discharge: 2012-12-04 | Disposition: A | Discharge status: Home / Self Care | Payer: Medicare Other | Source: Ambulatory Visit | Attending: Gastroenterology | Admitting: Gastroenterology

## 2012-12-04 DIAGNOSIS — R110 Nausea: Secondary | ICD-10-CM

## 2012-12-05 ENCOUNTER — Ambulatory Visit
Admission: RE | Admit: 2012-12-05 | Discharge: 2012-12-05 | Disposition: A | Discharge status: Home / Self Care | Payer: No Typology Code available for payment source | Source: Ambulatory Visit | Attending: Neurology | Admitting: Neurology

## 2012-12-05 DIAGNOSIS — F0390 Unspecified dementia without behavioral disturbance: Secondary | ICD-10-CM

## 2012-12-05 DIAGNOSIS — G309 Alzheimer's disease, unspecified: Secondary | ICD-10-CM

## 2012-12-08 ENCOUNTER — Other Ambulatory Visit: Payer: Self-pay | Admitting: Neurology

## 2012-12-08 DIAGNOSIS — R413 Other amnesia: Secondary | ICD-10-CM

## 2012-12-18 ENCOUNTER — Encounter (HOSPITAL_COMMUNITY)
Admission: RE | Admit: 2012-12-18 | Discharge: 2012-12-18 | Disposition: A | Discharge status: Home / Self Care | Payer: Self-pay | Source: Ambulatory Visit | Attending: Neurology | Admitting: Neurology

## 2012-12-18 DIAGNOSIS — R413 Other amnesia: Secondary | ICD-10-CM | POA: Insufficient documentation

## 2012-12-22 ENCOUNTER — Ambulatory Visit: Payer: Medicare Other | Admitting: Critical Care Medicine

## 2013-01-05 NOTE — Progress Notes (Signed)
Patient was contacted today and made aware of being ineligibile for the EXPEDITION 3 trial. Patient was advised to follow up with Dr. Terrace Arabia regarding further treatment for memory loss.

## 2013-01-13 ENCOUNTER — Telehealth: Payer: Self-pay | Admitting: Neurology

## 2013-01-13 NOTE — Telephone Encounter (Signed)
Message copied by Levert Feinstein on Tue Jan 13, 2013  4:53 PM ------      Message from: Karle Plumber      Created: Mon Jan 05, 2013  2:30 PM      Regarding: Leafy Kindle       Hey Dr. Terrace Arabia.       The PET scan for Mr. Rosko was positive but the sponsor would not allow [place] to enroll him based on the MMSE score. I scored him a 23 but because he could not complete the serial 7's they excluded him. Le me know if you have any questions. I believe that you were going to start another medication if he did not qualify.             Wes.  ------

## 2013-01-13 NOTE — Telephone Encounter (Signed)
Chart reviewed, last office visit was in April, please give him a follow up appt in 2-3 months, will discuss further treatment then

## 2013-01-14 NOTE — Telephone Encounter (Signed)
Will call patient with appointment, gave to schedulers.

## 2013-01-27 ENCOUNTER — Other Ambulatory Visit: Payer: Self-pay | Admitting: Family Medicine

## 2013-01-27 ENCOUNTER — Ambulatory Visit: Payer: Self-pay | Admitting: Critical Care Medicine

## 2013-01-27 ENCOUNTER — Ambulatory Visit
Admission: RE | Admit: 2013-01-27 | Discharge: 2013-01-27 | Disposition: A | Discharge status: Home / Self Care | Payer: Medicare Other | Source: Ambulatory Visit | Attending: Family Medicine | Admitting: Family Medicine

## 2013-01-27 DIAGNOSIS — R197 Diarrhea, unspecified: Secondary | ICD-10-CM

## 2013-02-10 ENCOUNTER — Ambulatory Visit: Payer: Self-pay | Admitting: Critical Care Medicine

## 2013-02-11 ENCOUNTER — Encounter: Payer: Self-pay | Admitting: Critical Care Medicine

## 2013-02-11 ENCOUNTER — Ambulatory Visit (INDEPENDENT_AMBULATORY_CARE_PROVIDER_SITE_OTHER): Payer: Medicare Other | Admitting: Critical Care Medicine

## 2013-02-11 VITALS — BP 94/52 | HR 76 | Temp 97.7°F | Ht 67.0 in | Wt 170.8 lb

## 2013-02-11 DIAGNOSIS — J45901 Unspecified asthma with (acute) exacerbation: Secondary | ICD-10-CM

## 2013-02-11 DIAGNOSIS — J4541 Moderate persistent asthma with (acute) exacerbation: Secondary | ICD-10-CM

## 2013-02-11 DIAGNOSIS — J441 Chronic obstructive pulmonary disease with (acute) exacerbation: Secondary | ICD-10-CM

## 2013-02-11 MED ORDER — AZITHROMYCIN 250 MG PO TABS: ORAL_TABLET | ORAL | Status: DC

## 2013-02-11 MED ORDER — BUDESONIDE-FORMOTEROL FUMARATE 160-4.5 MCG/ACT IN AERO: 2.0000 | INHALATION_SPRAY | Freq: Two times a day (BID) | RESPIRATORY_TRACT | Status: DC

## 2013-02-11 MED ORDER — PREDNISONE 10 MG PO TABS: ORAL_TABLET | ORAL | Status: DC

## 2013-02-11 NOTE — Patient Instructions (Addendum)
Continue to use maintenance inhalers Take azithromycin for 5 days and prednisone for 8 days with tapering the dose Take over the counter saline rinse for drainage Follow up in 4 months

## 2013-02-11 NOTE — Assessment & Plan Note (Signed)
Gold stage C. COPD with recurrent exacerbation Plan Continue to use maintenance inhalers Take azithromycin for 5 days and prednisone for 8 days with tapering the dose Take over the counter saline rinse for drainage Follow up in 4 months

## 2013-02-11 NOTE — Progress Notes (Signed)
Subjective:    Patient ID: [name] [name], male    DOB: 1930-10-14, 79 y.o.   MRN: [id number]  HPI  Pt presents for F/U with history of COPD stage C. His current sx are DOE, cough with white/yellow phlegm, head congestion, nasal drip, and chest tightness. He denies chest pain, leg swelling. Functionally he is SOB with activity and uses his albuterol 3-4x a day when his allergies are acting up. His CAT score is 20 and he feels that his sx have gotten progressively worse since the last visit due to congestion. His current medications are abluterol and symbicort.  Review of Systems    Denies chest pain, leg swelling Objective:   Physical Exam BP 94/52  Pulse 76  Temp(Src) 97.7 F (36.5 C) (Oral)  Ht 5\' 7"  (1.702 m)  Wt 170 lb 12.8 oz (77.474 kg)  BMI 26.74 kg/m2  SpO2 97%  HEENT: Head is normocephalic and atraumatic. Extraocular muscles are intact. Pupils are equal, round, and reactive to light. Nares appeared normal. Mouth is well hydrated and without lesions. Mucous membranes are moist. Posterior pharynx clear of any exudate or lesions. Nasal drip noted on uvula NECK: Supple. No carotid bruits. No lymphadenopathy or thyromegaly. LUNGS: Clear to auscultation. Diminished breath sounds, no wheezing. HEART: Regular rate and rhythm without murmur.  ABDOMEN: Soft, nontender, and nondistended. Positive bowel sounds. No hepatosplenomegaly was noted.  EXTREMITIES: Without any cyanosis, clubbing, rash, lesions or edema.  NEUROLOGIC: Cranial nerves II through XII are grossly intact.  PSYCHIATRIC: normal affect SKIN: No ulceration or induration present.      Assessment & Plan:   Obstructive chronic bronchitis with exacerbation, Gold C Copd Gold stage C. COPD with recurrent exacerbation Plan Continue to use maintenance inhalers Take azithromycin for 5 days and prednisone for 8 days with tapering the dose Take over the counter saline rinse for drainage Follow up in 4 months   Updated  Medication List Outpatient Encounter Prescriptions as of 02/11/2013  Medication Sig Dispense Refill  . albuterol (PROVENTIL HFA;VENTOLIN HFA) 108 (90 BASE) MCG/ACT inhaler Inhale 2 puffs into the lungs every 6 (six) hours as needed for wheezing.  1 Inhaler  6  . ALPRAZolam (XANAX) 0.5 MG tablet Take 1 tablet (0.5 mg total) by mouth daily as needed. anxiety  30 tablet  5  . aspirin 325 MG tablet Take 325 mg by mouth daily.        . budesonide-formoterol (SYMBICORT) 160-4.5 MCG/ACT inhaler Inhale 2 puffs into the lungs 2 (two) times daily.  1 Inhaler  11  . calcitRIOL (ROCALTROL) 0.25 MCG capsule Take 0.25 mcg by mouth daily.       Marland Kitchen dexlansoprazole (DEXILANT) 60 MG capsule Take 60 mg by mouth daily.      . finasteride (PROSCAR) 5 MG tablet Take 5 mg by mouth daily.       Marland Kitchen gabapentin (NEURONTIN) 400 MG capsule Take 800 mg by mouth 3 (three) times daily.      Marland Kitchen galantamine (RAZADYNE ER) 24 MG 24 hr capsule Take 1 capsule (24 mg total) by mouth daily with breakfast.  30 capsule  12  . losartan (COZAAR) 50 MG tablet Take 1 tablet (50 mg total) by mouth daily.  30 tablet  6  . mirtazapine (REMERON) 15 MG tablet Take 30 mg by mouth at bedtime.       . Multiple Vitamin (MULTIVITAMIN) tablet Take 1 tablet by mouth daily.        . nitroGLYCERIN (NITROSTAT) 0.4 MG  SL tablet Place 0.4 mg under the tongue every 5 (five) minutes as needed. Chest pain      . promethazine (PHENERGAN) 25 MG tablet 1-2 tablets by mouth every 6 hours as needed for nausea      . sodium bicarbonate 325 MG tablet Take 325 mg by mouth 2 (two) times daily.        . Tamsulosin HCl (FLOMAX) 0.4 MG CAPS Take 0.4 mg by mouth daily.        . traMADol (ULTRAM) 50 MG tablet Take 1 tablet (50 mg total) by mouth every 8 (eight) hours as needed. pain  90 tablet  5  . TRAVATAN Z 0.004 % SOLN ophthalmic solution Place 1 drop into both eyes At bedtime.      . [DISCONTINUED] budesonide-formoterol (SYMBICORT) 160-4.5 MCG/ACT inhaler Inhale 2 puffs  into the lungs 2 (two) times daily.  1 Inhaler  6  . [DISCONTINUED] gabapentin (NEURONTIN) 400 MG capsule Take 1 capsule (400 mg total) by mouth 3 (three) times daily.  90 capsule  2  . azithromycin (ZITHROMAX) 250 MG tablet Take two once then one daily until gone  6 tablet  0  . lubiprostone (AMITIZA) 24 MCG capsule ON HOLD      . predniSONE (DELTASONE) 10 MG tablet Take 4 for two days three for two days two for two days one for two days  20 tablet  0  . [DISCONTINUED] ondansetron (ZOFRAN) 4 MG tablet Take 1 tablet (4 mg total) by mouth every 6 (six) hours.  12 tablet  0  . [DISCONTINUED] vitamin B-12 (CYANOCOBALAMIN) 250 MCG tablet Take 250 mcg by mouth daily.        . [DISCONTINUED] Vitamin D, Ergocalciferol, (DRISDOL) 50000 UNITS CAPS Take 50,000 Units by mouth every 7 (seven) days.       No facility-administered encounter medications on file as of 02/11/2013.

## 2013-04-10 ENCOUNTER — Encounter: Payer: Self-pay | Admitting: Neurology

## 2013-04-10 ENCOUNTER — Ambulatory Visit (INDEPENDENT_AMBULATORY_CARE_PROVIDER_SITE_OTHER): Payer: Medicare Other | Admitting: Neurology

## 2013-04-10 VITALS — BP 126/71 | HR 82 | Ht 67.0 in | Wt 170.0 lb

## 2013-04-10 DIAGNOSIS — I10 Essential (primary) hypertension: Secondary | ICD-10-CM

## 2013-04-10 DIAGNOSIS — F0280 Dementia in other diseases classified elsewhere without behavioral disturbance: Secondary | ICD-10-CM

## 2013-04-10 DIAGNOSIS — I2510 Atherosclerotic heart disease of native coronary artery without angina pectoris: Secondary | ICD-10-CM

## 2013-04-10 DIAGNOSIS — E785 Hyperlipidemia, unspecified: Secondary | ICD-10-CM

## 2013-04-10 MED ORDER — MEMANTINE HCL 28 X 5 MG & 21 X 10 MG PO TABS: ORAL_TABLET | ORAL | Status: DC

## 2013-04-10 MED ORDER — MEMANTINE HCL ER 28 MG PO CP24: 28.0000 mg | ORAL_CAPSULE | Freq: Every day | ORAL | Status: DC

## 2013-04-10 NOTE — Progress Notes (Signed)
History of Present Illness  [name] [name] is a 79 years old right-handed African American male, accompanied by his wife at today's clinical visit, he was a patient of Dr. 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 Some anxiety, improved on Remeron and prn Xanax.   He is occasionally irritable and easily provoked to anger. Wife  says that he has decreased appetite recently.  UPDATE Sept 19th 2014:  He continues to complain mild slow worsening memory trouble, still very active at home, Today Mini-Mental Status is 23 out of 30, he wants more aggressive treatment, I have discussed with him the LZAX trial, he decided to proceed with it. I will also start Namenda 28 mammogram every day.   Review of Systems  Out of a complete 14 system review, the patient complains of only the following symptoms, and all other reviewed systems are negative.   Constitutional:   Weight loss Cardiovascular:  Chest pain Ear/Nose/Throat:  N/A Skin: N/A Eyes: N/A Respiratory: Shortness of breath, cough, wheezing,  Gastroitestinal: Diarrhea, incontinence Hematology/Lymphatic:  Anemia Endocrine:  Feeling cold Musculoskeletal:N/A Allergy/Immunology: running nose Neurological: Memory loss, numbness  confusion Psychiatric:  Anxiety, decreased energy    Physical Exam  Neck: supple no carotid bruits Respiratory: clear to auscultation bilaterally Cardiovascular: regular rate rhythm  Neurologic Exam  Mental Status: pleasant, awake, alert, cooperative to history, talking, and casual conversation. Mini-Mental Status Examination 23 of 30, he has difficulty spell world backward, concentrate,  missed two out of three recalls, could not copy design. Cranial Nerves: CN II-XII pupils were equal round reactive to light.   Extraocular movements were full.  Visual fields were full on confrontational test.  Facial sensation and strength were normal.  Hearing was intact to finger rubbing bilaterally.  Uvula tongue were midline.  Head turning and shoulder shrugging were normal and symmetric.  Tongue protrusion into the cheeks strength were normal.  Motor: Normal tone, bulk, and strength. Sensory: Normal to light touch Coordination: Normal finger-to-nose, heel-to-shin.  There was no dysmetria noticed. Gait and Station: Narrow based and steady  Romberg sign: Negative Reflexes: Deep tendon reflexes:present and symmetric.  Plantar responses are flexor.  Assessment and Plan: [name] is a 79 year old, right handed, African-American male with history of memory loss. His neuropsychologic testing in 2009 indicated dementia. He has been taking and tolerating Razadyne ER.   MMSE score today 23/30  1.  Add on namenda xr 28mg  qhs. 2. have discussed with him and his wife about LZAX trial, he decided to proceed with that. 3. RTC with Eber [place]head in 6 months

## 2013-04-16 ENCOUNTER — Other Ambulatory Visit: Payer: Medicare Other | Admitting: Lab

## 2013-04-17 ENCOUNTER — Telehealth: Payer: Self-pay | Admitting: Neurology

## 2013-04-17 NOTE — Telephone Encounter (Signed)
Patient wife would like to know if he is to continue or discontinue Galantamine 24 mg since he started Namenda? Please advise.

## 2013-04-17 NOTE — Telephone Encounter (Signed)
I have called him,  left the message,  he should continue galantamine 24mg  along with nemanda

## 2013-04-21 ENCOUNTER — Ambulatory Visit: Payer: Self-pay | Admitting: Critical Care Medicine

## 2013-04-23 ENCOUNTER — Other Ambulatory Visit: Payer: Medicare Other | Admitting: Lab

## 2013-04-23 ENCOUNTER — Ambulatory Visit: Payer: Medicare Other | Admitting: Oncology

## 2013-05-05 ENCOUNTER — Telehealth: Payer: Self-pay | Admitting: Neurology

## 2013-05-05 NOTE — Telephone Encounter (Signed)
I have called him, yes, he should take Galantamine along with Namenda.

## 2013-05-19 ENCOUNTER — Ambulatory Visit: Payer: Self-pay | Admitting: Critical Care Medicine

## 2013-05-20 ENCOUNTER — Encounter (HOSPITAL_COMMUNITY): Payer: Self-pay | Admitting: Emergency Medicine

## 2013-05-20 ENCOUNTER — Emergency Department (HOSPITAL_COMMUNITY)
Admission: EM | Admit: 2013-05-20 | Discharge: 2013-05-20 | Disposition: A | Discharge status: Home / Self Care | Payer: Medicare Other | Attending: Emergency Medicine | Admitting: Emergency Medicine

## 2013-05-20 ENCOUNTER — Emergency Department (HOSPITAL_COMMUNITY): Payer: Medicare Other

## 2013-05-20 DIAGNOSIS — J449 Chronic obstructive pulmonary disease, unspecified: Secondary | ICD-10-CM | POA: Insufficient documentation

## 2013-05-20 DIAGNOSIS — H9319 Tinnitus, unspecified ear: Secondary | ICD-10-CM | POA: Insufficient documentation

## 2013-05-20 DIAGNOSIS — J4489 Other specified chronic obstructive pulmonary disease: Secondary | ICD-10-CM | POA: Insufficient documentation

## 2013-05-20 DIAGNOSIS — Z79899 Other long term (current) drug therapy: Secondary | ICD-10-CM | POA: Insufficient documentation

## 2013-05-20 DIAGNOSIS — Z9849 Cataract extraction status, unspecified eye: Secondary | ICD-10-CM | POA: Insufficient documentation

## 2013-05-20 DIAGNOSIS — N400 Enlarged prostate without lower urinary tract symptoms: Secondary | ICD-10-CM | POA: Insufficient documentation

## 2013-05-20 DIAGNOSIS — Z7982 Long term (current) use of aspirin: Secondary | ICD-10-CM | POA: Insufficient documentation

## 2013-05-20 DIAGNOSIS — IMO0002 Reserved for concepts with insufficient information to code with codable children: Secondary | ICD-10-CM | POA: Insufficient documentation

## 2013-05-20 DIAGNOSIS — Z792 Long term (current) use of antibiotics: Secondary | ICD-10-CM | POA: Insufficient documentation

## 2013-05-20 DIAGNOSIS — Z87891 Personal history of nicotine dependence: Secondary | ICD-10-CM | POA: Insufficient documentation

## 2013-05-20 DIAGNOSIS — I2510 Atherosclerotic heart disease of native coronary artery without angina pectoris: Secondary | ICD-10-CM | POA: Insufficient documentation

## 2013-05-20 DIAGNOSIS — H6690 Otitis media, unspecified, unspecified ear: Secondary | ICD-10-CM | POA: Insufficient documentation

## 2013-05-20 DIAGNOSIS — H6691 Otitis media, unspecified, right ear: Secondary | ICD-10-CM

## 2013-05-20 DIAGNOSIS — Z951 Presence of aortocoronary bypass graft: Secondary | ICD-10-CM | POA: Insufficient documentation

## 2013-05-20 DIAGNOSIS — K219 Gastro-esophageal reflux disease without esophagitis: Secondary | ICD-10-CM | POA: Insufficient documentation

## 2013-05-20 DIAGNOSIS — R42 Dizziness and giddiness: Secondary | ICD-10-CM

## 2013-05-20 DIAGNOSIS — F0280 Dementia in other diseases classified elsewhere without behavioral disturbance: Secondary | ICD-10-CM | POA: Insufficient documentation

## 2013-05-20 DIAGNOSIS — I10 Essential (primary) hypertension: Secondary | ICD-10-CM | POA: Insufficient documentation

## 2013-05-20 DIAGNOSIS — G309 Alzheimer's disease, unspecified: Secondary | ICD-10-CM | POA: Insufficient documentation

## 2013-05-20 DIAGNOSIS — E785 Hyperlipidemia, unspecified: Secondary | ICD-10-CM | POA: Insufficient documentation

## 2013-05-20 MED ORDER — MECLIZINE HCL 25 MG PO TABS: 25.0000 mg | ORAL_TABLET | Freq: Four times a day (QID) | ORAL | Status: DC | PRN

## 2013-05-20 MED ORDER — MECLIZINE HCL 12.5 MG PO TABS
25.0000 mg | ORAL_TABLET | Freq: Once | ORAL | Status: AC
  Administered 2013-05-20: 25 mg via ORAL
  Filled 2013-05-20: qty 2

## 2013-05-20 MED ORDER — AMOXICILLIN-POT CLAVULANATE 875-125 MG PO TABS: 1.0000 | ORAL_TABLET | Freq: Two times a day (BID) | ORAL | Status: DC

## 2013-05-20 NOTE — ED Notes (Signed)
Pt complaining of ringing in ears and dizziness.

## 2013-05-20 NOTE — ED Notes (Signed)
Patient given discharge instruction, verbalized understand. Patient ambulatory out of the department.  

## 2013-05-20 NOTE — ED Provider Notes (Signed)
CSN: [id number]     Arrival date & time 05/20/13  1856 History   First MD Initiated Contact with Patient 05/20/13 2022     Chief Complaint  Patient presents with  . Dizziness  . Tinnitus   (Consider location/radiation/quality/duration/timing/severity/associated sxs/prior Treatment) HPI.... sense of imbalance for several days with associated ringing in bilateral ears.  Also complains of head congestion.   No stiff neck, fever, chills, arm or leg weakness, slurred speech, drooping of mouth.  Severity is mild to moderate. Nothing makes symptoms better or worse the  Past Medical History  Diagnosis Date  . Renal insufficiency   . PVD (peripheral vascular disease)   . Hypertension   . Hyperlipemia   . GERD (gastroesophageal reflux disease)   . CAD (coronary artery disease)   . COPD (chronic obstructive pulmonary disease)   . Monoclonal gammopathy   . BPH (benign prostatic hyperplasia)   . DEMENTIA   . Alzheimer disease   . Memory loss    Past Surgical History  Procedure Laterality Date  . Coronary artery bypass graft  1997  . Left cea  2002  . Back surgery  2011  . Cataract extraction w/phaco  09/18/2011    Procedure: CATARACT EXTRACTION PHACO AND INTRAOCULAR LENS PLACEMENT (IOC);  Surgeon: Loraine Leriche T. Nile Riggs, MD;  Location: [facility];  Service: Ophthalmology;  Laterality: Right;  CDE: 6.19  . Cardiac surgery     Family History  Problem Relation Age of Onset  . Anesthesia problems Neg Hx   . Hypotension Neg Hx   . Malignant hyperthermia Neg Hx   . Pseudochol deficiency Neg Hx   . High blood pressure    . High Cholesterol     History  Substance Use Topics  . Smoking status: Former Smoker -- 0.50 packs/day for 40 years    Types: Cigarettes    Quit date: 07/23/1978  . Smokeless tobacco: Never Used  . Alcohol Use: No    Review of Systems  All other systems reviewed and are negative.    Allergies  Review of patient's allergies indicates no known allergies.  Home  Medications   Current Outpatient Rx  Name  Route  Sig  Dispense  Refill  . albuterol (PROVENTIL HFA;VENTOLIN HFA) 108 (90 BASE) MCG/ACT inhaler   Inhalation   Inhale 2 puffs into the lungs every 6 (six) hours as needed for wheezing.   1 Inhaler   6   . ALPRAZolam (XANAX) 0.5 MG tablet   Oral   Take 1 tablet (0.5 mg total) by mouth daily as needed. anxiety   30 tablet   5   . aspirin 325 MG tablet   Oral   Take 325 mg by mouth daily.           . budesonide-formoterol (SYMBICORT) 160-4.5 MCG/ACT inhaler   Inhalation   Inhale 2 puffs into the lungs 2 (two) times daily.   1 Inhaler   11   . calcitRIOL (ROCALTROL) 0.25 MCG capsule   Oral   Take 0.25 mcg by mouth daily.          . ciprofloxacin (CIPRO) 250 MG tablet   Oral   Take 250 mg by mouth 2 (two) times daily. 14 day course         . dexlansoprazole (DEXILANT) 60 MG capsule   Oral   Take 60 mg by mouth daily.         . finasteride (PROSCAR) 5 MG tablet   Oral   Take  5 mg by mouth daily.          Marland Kitchen gabapentin (NEURONTIN) 400 MG capsule   Oral   Take 800 mg by mouth 3 (three) times daily.         Marland Kitchen galantamine (RAZADYNE ER) 24 MG 24 hr capsule   Oral   Take 1 capsule (24 mg total) by mouth daily with breakfast.   30 capsule   12   . losartan (COZAAR) 50 MG tablet   Oral   Take 1 tablet (50 mg total) by mouth daily.   30 tablet   6   . lubiprostone (AMITIZA) 24 MCG capsule   Oral   Take 24 mcg by mouth every other day. Taken only as needed         . Memantine HCl ER (NAMENDA XR) 28 MG CP24   Oral   Take 28 mg by mouth daily.   30 capsule   12   . mirtazapine (REMERON) 15 MG tablet   Oral   Take 30 mg by mouth at bedtime.          . nitroGLYCERIN (NITROSTAT) 0.4 MG SL tablet   Sublingual   Place 0.4 mg under the tongue every 5 (five) minutes as needed. Chest pain         . promethazine (PHENERGAN) 25 MG tablet      1-2 tablets by mouth every 6 hours as needed for nausea          . sodium bicarbonate 325 MG tablet   Oral   Take 325 mg by mouth 2 (two) times daily.           . Tamsulosin HCl (FLOMAX) 0.4 MG CAPS   Oral   Take 0.4 mg by mouth at bedtime.          . TRAVATAN Z 0.004 % SOLN ophthalmic solution   Both Eyes   Place 1 drop into both eyes At bedtime.         Marland Kitchen amoxicillin-clavulanate (AUGMENTIN) 875-125 MG per tablet   Oral   Take 1 tablet by mouth 2 (two) times daily. One po bid x 7 days   14 tablet   0   . meclizine (ANTIVERT) 25 MG tablet   Oral   Take 1 tablet (25 mg total) by mouth every 6 (six) hours as needed for dizziness.   15 tablet   0    BP 134/94  Pulse 75  Temp(Src) 97.7 F (36.5 C) (Oral)  Ht 5\' 8"  (1.727 m)  Wt 173 lb (78.472 kg)  BMI 26.31 kg/m2  SpO2 98% Physical Exam  Nursing note and vitals reviewed. Constitutional: He is oriented to person, place, and time. He appears well-developed and well-nourished.  HENT:  Head: Normocephalic and atraumatic.  Right tympanic membrane red and inflamed  Eyes: Conjunctivae and EOM are normal. Pupils are equal, round, and reactive to light.  Neck: Normal range of motion. Neck supple.  Cardiovascular: Normal rate, regular rhythm and normal heart sounds.   Pulmonary/Chest: Effort normal and breath sounds normal.  Abdominal: Soft. Bowel sounds are normal.  Musculoskeletal: Normal range of motion.  Neurological: He is alert and oriented to person, place, and time.  Patient is ambulatory with very minimal ataxia  Skin: Skin is warm and dry.  Psychiatric: He has a normal mood and affect.    ED Course  Procedures (including critical care time) Labs Review Labs Reviewed - No data to display Imaging Review No results  found.  EKG Interpretation   None       MDM   1. Right otitis media   2. Vertigo      Patient is alert, no motor or sensory deficits.  Right tympanic membrane is obviously inflamed.  I suspect his otitis media is contributing to vertigo and  slight ataxia.  CT scan of head pending. Discharge medications Augmentin 875 or 125 and meclizine 25 mg. Discussed with Dr. Mila Palmer, MD 05/20/13 2205

## 2013-05-20 NOTE — ED Notes (Signed)
Per pt, "I've known my balance wasn't good for about a week. My ears started ringing so badly this evening that it scared me." Complaining of congestion as well x 2 weeks

## 2013-05-27 ENCOUNTER — Other Ambulatory Visit: Payer: Self-pay | Admitting: Neurology

## 2013-05-27 MED ORDER — ALPRAZOLAM 0.5 MG PO TABS: 0.5000 mg | ORAL_TABLET | Freq: Every day | ORAL | Status: DC | PRN

## 2013-05-28 NOTE — Telephone Encounter (Signed)
Pt's prescription was faxed over to [facility] at [phone].

## 2013-05-29 ENCOUNTER — Telehealth: Payer: Self-pay | Admitting: Neurology

## 2013-06-04 ENCOUNTER — Telehealth: Payer: Self-pay | Admitting: Neurology

## 2013-06-04 NOTE — Telephone Encounter (Signed)
[name] [name]:  Please call back patient, Yes, he is supposed to take both galantamine and namenda.

## 2013-06-04 NOTE — Telephone Encounter (Signed)
Spoke with wife and she give patient both meds per Dr Marjory Lies, will call back on Monday for update

## 2013-06-04 NOTE — Telephone Encounter (Signed)
Dr. Terrace Arabia said take both meds. -VRP

## 2013-06-08 ENCOUNTER — Telehealth: Payer: Self-pay | Admitting: Neurology

## 2013-06-08 NOTE — Telephone Encounter (Signed)
Spoke to wife and relayed patient is suppose to take both the galantamine and Namenda, per Dr. Terrace Arabia.

## 2013-06-10 NOTE — Telephone Encounter (Signed)
Patient need Dr. Terrace Arabia to review medication to see if there is something else patient can do, medication too expensive. Please advise.

## 2013-06-10 NOTE — Telephone Encounter (Signed)
I have called 3 numbers listed, fail to reach patient, left message for him to call back for discussion.

## 2013-06-17 ENCOUNTER — Telehealth: Payer: Self-pay | Admitting: *Deleted

## 2013-06-23 NOTE — Telephone Encounter (Signed)
Called patient and spoke with patient's wife about his medication Namenda 28 mg daily. Patient's wife Malachi Bonds stated that when the patient takes this medication, that it made him irritable and she wondered if the milligrams was to high. Have not refill the medication sent October. Please advise.

## 2013-06-25 MED ORDER — DONEPEZIL HCL 10 MG PO TABS: 10.0000 mg | ORAL_TABLET | Freq: Every day | ORAL | Status: DC

## 2013-06-25 NOTE — Telephone Encounter (Signed)
I have talked with his wife, he has not taken his namenda xr 28mg  qday since Nov, 2014, 45 dollars co-pay, he is also taking galantamine, also USD45 co-pay, likely increased to USD95 next year, wife is concerned about the cost, there was no significant side effect to notice, I have suggested wife, continue taking Namenda 28 mg every day, I will call in prescription of Donepazil 10 mg every day after meal,in replace of galantamine, keep followup appointment,

## 2013-08-15 ENCOUNTER — Emergency Department (HOSPITAL_COMMUNITY)
Admission: EM | Admit: 2013-08-15 | Discharge: 2013-08-15 | Disposition: A | Discharge status: Home / Self Care | Payer: Medicare HMO | Attending: Emergency Medicine | Admitting: Emergency Medicine

## 2013-08-15 ENCOUNTER — Emergency Department (HOSPITAL_COMMUNITY): Payer: Medicare HMO

## 2013-08-15 ENCOUNTER — Encounter (HOSPITAL_COMMUNITY): Payer: Self-pay | Admitting: Emergency Medicine

## 2013-08-15 DIAGNOSIS — K219 Gastro-esophageal reflux disease without esophagitis: Secondary | ICD-10-CM | POA: Insufficient documentation

## 2013-08-15 DIAGNOSIS — Z87448 Personal history of other diseases of urinary system: Secondary | ICD-10-CM | POA: Insufficient documentation

## 2013-08-15 DIAGNOSIS — I10 Essential (primary) hypertension: Secondary | ICD-10-CM | POA: Insufficient documentation

## 2013-08-15 DIAGNOSIS — Z8639 Personal history of other endocrine, nutritional and metabolic disease: Secondary | ICD-10-CM | POA: Insufficient documentation

## 2013-08-15 DIAGNOSIS — D649 Anemia, unspecified: Secondary | ICD-10-CM | POA: Insufficient documentation

## 2013-08-15 DIAGNOSIS — Z7982 Long term (current) use of aspirin: Secondary | ICD-10-CM | POA: Insufficient documentation

## 2013-08-15 DIAGNOSIS — Z862 Personal history of diseases of the blood and blood-forming organs and certain disorders involving the immune mechanism: Secondary | ICD-10-CM | POA: Insufficient documentation

## 2013-08-15 DIAGNOSIS — J449 Chronic obstructive pulmonary disease, unspecified: Secondary | ICD-10-CM | POA: Insufficient documentation

## 2013-08-15 DIAGNOSIS — F0390 Unspecified dementia without behavioral disturbance: Secondary | ICD-10-CM | POA: Insufficient documentation

## 2013-08-15 DIAGNOSIS — Z87891 Personal history of nicotine dependence: Secondary | ICD-10-CM | POA: Insufficient documentation

## 2013-08-15 DIAGNOSIS — Z79899 Other long term (current) drug therapy: Secondary | ICD-10-CM | POA: Insufficient documentation

## 2013-08-15 DIAGNOSIS — J069 Acute upper respiratory infection, unspecified: Secondary | ICD-10-CM | POA: Insufficient documentation

## 2013-08-15 DIAGNOSIS — I2510 Atherosclerotic heart disease of native coronary artery without angina pectoris: Secondary | ICD-10-CM | POA: Insufficient documentation

## 2013-08-15 DIAGNOSIS — M542 Cervicalgia: Secondary | ICD-10-CM | POA: Insufficient documentation

## 2013-08-15 DIAGNOSIS — G309 Alzheimer's disease, unspecified: Secondary | ICD-10-CM | POA: Insufficient documentation

## 2013-08-15 DIAGNOSIS — J4489 Other specified chronic obstructive pulmonary disease: Secondary | ICD-10-CM | POA: Insufficient documentation

## 2013-08-15 DIAGNOSIS — R079 Chest pain, unspecified: Secondary | ICD-10-CM | POA: Insufficient documentation

## 2013-08-15 DIAGNOSIS — F0280 Dementia in other diseases classified elsewhere without behavioral disturbance: Secondary | ICD-10-CM | POA: Insufficient documentation

## 2013-08-15 DIAGNOSIS — N400 Enlarged prostate without lower urinary tract symptoms: Secondary | ICD-10-CM | POA: Insufficient documentation

## 2013-08-15 LAB — BASIC METABOLIC PANEL
BUN: 12 mg/dL (ref 6–23)
CALCIUM: 9.1 mg/dL (ref 8.4–10.5)
CO2: 28 mEq/L (ref 19–32)
CREATININE: 1.73 mg/dL — AB (ref 0.50–1.35)
Chloride: 104 mEq/L (ref 96–112)
GFR calc non Af Amer: 36 mL/min — ABNORMAL LOW (ref >90)
GFR, EST AFRICAN AMERICAN: 41 mL/min — AB (ref >90)
Glucose, Bld: 78 mg/dL (ref 70–99)
Potassium: 4.7 mEq/L (ref 3.7–5.3)
Sodium: 141 mEq/L (ref 137–147)

## 2013-08-15 LAB — CBC WITH DIFFERENTIAL/PLATELET
BASOS ABS: 0 10*3/uL (ref 0.0–0.1)
BASOS PCT: 0 % (ref 0–1)
EOS ABS: 0.1 10*3/uL (ref 0.0–0.7)
Eosinophils Relative: 2 % (ref 0–5)
HCT: 32.6 % — ABNORMAL LOW (ref 39.0–52.0)
Hemoglobin: 10.9 g/dL — ABNORMAL LOW (ref 13.0–17.0)
LYMPHS PCT: 47 % — AB (ref 12–46)
Lymphs Abs: 2.5 10*3/uL (ref 0.7–4.0)
MCH: 32.3 pg (ref 26.0–34.0)
MCHC: 33.4 g/dL (ref 30.0–36.0)
MCV: 96.7 fL (ref 78.0–100.0)
MONO ABS: 0.4 10*3/uL (ref 0.1–1.0)
Monocytes Relative: 7 % (ref 3–12)
Neutro Abs: 2.4 10*3/uL (ref 1.7–7.7)
Neutrophils Relative %: 45 % (ref 43–77)
PLATELETS: 235 10*3/uL (ref 150–400)
RBC: 3.37 MIL/uL — ABNORMAL LOW (ref 4.22–5.81)
RDW: 15.6 % — AB (ref 11.5–15.5)
WBC: 5.4 10*3/uL (ref 4.0–10.5)

## 2013-08-15 LAB — URINALYSIS, ROUTINE W REFLEX MICROSCOPIC
Bilirubin Urine: NEGATIVE
Glucose, UA: NEGATIVE mg/dL
Hgb urine dipstick: NEGATIVE
KETONES UR: NEGATIVE mg/dL
Leukocytes, UA: NEGATIVE
Nitrite: NEGATIVE
PROTEIN: NEGATIVE mg/dL
Specific Gravity, Urine: 1.02 (ref 1.005–1.030)
Urobilinogen, UA: 0.2 mg/dL (ref 0.0–1.0)
pH: 6 (ref 5.0–8.0)

## 2013-08-15 LAB — OCCULT BLOOD, POC DEVICE: Fecal Occult Bld: NEGATIVE

## 2013-08-15 NOTE — ED Notes (Signed)
Patient with no complaints at this time. Respirations even and unlabored. Skin warm/dry. Discharge instructions reviewed with patient at this time. Patient given opportunity to voice concerns/ask questions. Patient discharged at this time and left Emergency Department with steady gait.   

## 2013-08-15 NOTE — ED Notes (Signed)
Pt c/o headache, neck pain, and htn x 3 days.  Unknown if has had fever.

## 2013-08-15 NOTE — Discharge Instructions (Signed)
°  Get plenty of rest, drink a lot of fluids. You can use Tylenol, Motrin as needed for pain. Use Robitussin-DM, for cough. Return, if needed, for problems.     Upper Respiratory Infection, Adult An upper respiratory infection (URI) is also known as the common cold. It is often caused by a type of germ (virus). Colds are easily spread (contagious). You can pass it to others by kissing, coughing, sneezing, or drinking out of the same glass. Usually, you get better in 1 or 2 weeks.  HOME CARE   Only take medicine as told by your doctor.  Use a warm mist humidifier or breathe in steam from a hot shower.  Drink enough water and fluids to keep your pee (urine) clear or pale yellow.  Get plenty of rest.  Return to work when your temperature is back to normal or as told by your doctor. You may use a face mask and wash your hands to stop your cold from spreading. GET HELP RIGHT AWAY IF:   After the first few days, you feel you are getting worse.  You have questions about your medicine.  You have chills, shortness of breath, or brown or red spit (mucus).  You have yellow or brown snot (nasal discharge) or pain in the face, especially when you bend forward.  You have a fever, puffy (swollen) neck, pain when you swallow, or white spots in the back of your throat.  You have a bad headache, ear pain, sinus pain, or chest pain.  You have a high-pitched whistling sound when you breathe in and out (wheezing).  You have a lasting cough or cough up blood.  You have sore muscles or a stiff neck. MAKE SURE YOU:   Understand these instructions.  Will watch your condition.  Will get help right away if you are not doing well or get worse. Document Released: 12/26/2007 Document Revised: 10/01/2011 Document Reviewed: 11/13/2010 [facility] Patient Information 2014 [place], [place].

## 2013-08-15 NOTE — ED Provider Notes (Signed)
CSN: [id number]     Arrival date & time 08/15/13  1239 History  This chart was scribed for [name] Blade, MD by Roe Coombs, ED Scribe. The patient was seen in room APA12/APA12. Patient's care was started at 1:53 PM.    Chief Complaint  Patient presents with  . Headache  . Neck Pain    The history is provided by the patient. No language interpreter was used.    HPI Comments:  JUNIOR HUEZO is a 80 y.o. male with history of CAD, HTN, COPD, PVD  who presents to the Emergency Department complaining a constant diffuse headache onset 2 weeks ago. Patient has also had constant congestion, rhinorrhea and unproductive cough for the past couple of weeks. Within the last few days patient began having neck pain and chest discomfort. Neck pain is worse when he turns his head. He denies fever or abdominal pain. There is no nausea, vomiting, dizziness,  or weakness. He has been taking Alka Seltzer Plus for symptom relief.  His blood pressure at home has measured 170/90, 178/82 in the last couple of days. He uses an albuterol inhaler PRN at home for COPD.  Past Medical History  Diagnosis Date  . Renal insufficiency   . PVD (peripheral vascular disease)   . Hypertension   . Hyperlipemia   . GERD (gastroesophageal reflux disease)   . CAD (coronary artery disease)   . COPD (chronic obstructive pulmonary disease)   . Monoclonal gammopathy   . BPH (benign prostatic hyperplasia)   . DEMENTIA   . Alzheimer disease   . Memory loss    Past Surgical History  Procedure Laterality Date  . Coronary artery bypass graft  1997  . Left cea  2002  . Back surgery  2011  . Cataract extraction w/phaco  09/18/2011    Procedure: CATARACT EXTRACTION PHACO AND INTRAOCULAR LENS PLACEMENT (IOC);  Surgeon: Elta Guadeloupe T. Gershon Crane, MD;  Location: [facility];  Service: Ophthalmology;  Laterality: Right;  CDE: 6.19  . Cardiac surgery     Family History  Problem Relation Age of Onset  . Anesthesia problems Neg Hx   . Hypotension  Neg Hx   . Malignant hyperthermia Neg Hx   . Pseudochol deficiency Neg Hx   . High blood pressure    . High Cholesterol     History  Substance Use Topics  . Smoking status: Former Smoker -- 0.50 packs/day for 40 years    Types: Cigarettes    Quit date: 07/23/1978  . Smokeless tobacco: Never Used  . Alcohol Use: No    Review of Systems  Constitutional: Negative for fever.  HENT: Positive for congestion and rhinorrhea.   Cardiovascular: Positive for chest pain.  Musculoskeletal: Positive for neck pain.  Neurological: Positive for headaches.  All other systems reviewed and are negative.    Allergies  Review of patient's allergies indicates no known allergies.  Home Medications   Current Outpatient Rx  Name  Route  Sig  Dispense  Refill  . acetaminophen (TYLENOL) 500 MG tablet   Oral   Take 1,000 mg by mouth every 6 (six) hours as needed for headache.         . albuterol (PROVENTIL HFA;VENTOLIN HFA) 108 (90 BASE) MCG/ACT inhaler   Inhalation   Inhale 2 puffs into the lungs every 6 (six) hours as needed for wheezing.   1 Inhaler   6   . ALPRAZolam (XANAX) 0.5 MG tablet   Oral   Take 1 tablet (  0.5 mg total) by mouth daily as needed. anxiety   30 tablet   3   . aspirin 325 MG tablet   Oral   Take 325 mg by mouth daily.           . budesonide-formoterol (SYMBICORT) 160-4.5 MCG/ACT inhaler   Inhalation   Inhale 2 puffs into the lungs 2 (two) times daily.   1 Inhaler   11   . calcitRIOL (ROCALTROL) 0.25 MCG capsule   Oral   Take 0.25 mcg by mouth daily.          . cholecalciferol (VITAMIN D) 1000 UNITS tablet   Oral   Take 1,000 Units by mouth daily.         Marland Kitchen dexlansoprazole (DEXILANT) 60 MG capsule   Oral   Take 60 mg by mouth daily.         Marland Kitchen donepezil (ARICEPT) 10 MG tablet   Oral   Take 1 tablet (10 mg total) by mouth at bedtime.   30 tablet   12   . finasteride (PROSCAR) 5 MG tablet   Oral   Take 5 mg by mouth daily.          Marland Kitchen  gabapentin (NEURONTIN) 400 MG capsule   Oral   Take 800 mg by mouth 3 (three) times daily.         Marland Kitchen galantamine (RAZADYNE ER) 24 MG 24 hr capsule   Oral   Take 1 capsule (24 mg total) by mouth daily with breakfast.   30 capsule   12   . losartan (COZAAR) 50 MG tablet   Oral   Take 1 tablet (50 mg total) by mouth daily.   30 tablet   6   . lubiprostone (AMITIZA) 24 MCG capsule   Oral   Take 24 mcg by mouth every other day. Taken only as needed         . Memantine HCl ER (NAMENDA XR) 28 MG CP24   Oral   Take 28 mg by mouth daily.   30 capsule   12   . mirtazapine (REMERON) 15 MG tablet   Oral   Take 30 mg by mouth at bedtime.          . Multiple Vitamin (MULTIVITAMIN WITH MINERALS) TABS tablet   Oral   Take 1 tablet by mouth daily.         . nitroGLYCERIN (NITROSTAT) 0.4 MG SL tablet   Sublingual   Place 0.4 mg under the tongue every 5 (five) minutes as needed. Chest pain         . promethazine (PHENERGAN) 25 MG tablet      1-2 tablets by mouth every 6 hours as needed for nausea         . sodium bicarbonate 325 MG tablet   Oral   Take 325 mg by mouth 2 (two) times daily.           . Tamsulosin HCl (FLOMAX) 0.4 MG CAPS   Oral   Take 0.4 mg by mouth at bedtime.          . TRAVATAN Z 0.004 % SOLN ophthalmic solution   Both Eyes   Place 1 drop into both eyes At bedtime.          Triage Vitals: BP 182/73  Pulse 69  Temp(Src) 97.4 F (36.3 C) (Oral)  Resp 20  Ht 5\' 7"  (1.702 m)  Wt 175 lb (79.379 kg)  BMI 27.40 kg/m2  SpO2 100% Physical Exam  Nursing note and vitals reviewed. Constitutional: He is oriented to person, place, and time. He appears well-developed and well-nourished.  HENT:  Head: Normocephalic and atraumatic.  Right Ear: External ear normal.  Left Ear: External ear normal.  Frontal and maxillary sinuses tender to percussion.  Eyes: Conjunctivae and EOM are normal. Pupils are equal, round, and reactive to light.  Neck:  Normal range of motion and phonation normal. Neck supple.  Tenderness in left lateral neck. No meningismus.  Cardiovascular: Normal rate, regular rhythm, normal heart sounds and intact distal pulses.   Pulmonary/Chest: Effort normal and breath sounds normal. He exhibits no bony tenderness.  Abdominal: Soft. Normal appearance. There is no tenderness.  Genitourinary:  Rectal examination- anus normal. Rectal vault empty. Mucous tested negative for blood. Prostate not enlarged, nontender, and there are no palpable nodules.  Musculoskeletal: Normal range of motion.  Neurological: He is alert and oriented to person, place, and time. No cranial nerve deficit or sensory deficit. He exhibits normal muscle tone. Coordination normal.  Skin: Skin is warm, dry and intact.  Psychiatric: He has a normal mood and affect. His behavior is normal. Judgment and thought content normal.    ED Course  Procedures (including critical care time)  COORDINATION OF CARE:  Medications - No data to display  Patient Vitals for the past 24 hrs:  BP Temp Temp src Pulse Resp SpO2 Height Weight  08/15/13 1250 - - - - - 100 % - -  08/15/13 1249 182/73 mmHg 97.4 F (36.3 C) Oral 69 20 92 % 5\' 7"  (1.702 m) 175 lb (79.379 kg)    1:56 PM- Will order CT of head and chest x-ray, CBC with diff, BMP and UA. Patient informed of current plan for treatment and evaluation and agrees with plan at this time.     Labs Review Labs Reviewed  CBC WITH DIFFERENTIAL - Abnormal; Notable for the following:    RBC 3.37 (*)    Hemoglobin 10.9 (*)    HCT 32.6 (*)    RDW 15.6 (*)    Lymphocytes Relative 47 (*)    All other components within normal limits  BASIC METABOLIC PANEL - Abnormal; Notable for the following:    Creatinine, Ser 1.73 (*)    GFR calc non Af Amer 36 (*)    GFR calc Af Amer 41 (*)    All other components within normal limits  URINALYSIS, ROUTINE W REFLEX MICROSCOPIC  OCCULT BLOOD, POC DEVICE    Imaging  Review Dg Chest 2 View  08/15/2013   CLINICAL DATA:  Headache with neck pain. Cough, congestion and fever.  EXAM: CHEST  2 VIEW  COMPARISON:  07/17/2011  FINDINGS: Lungs are adequately inflated with stable bilateral patchy increased interstitial markings and emphysematous disease. No focal consolidation or effusion. The cardiomediastinal silhouette and remainder the exam is unchanged.  IMPRESSION: No active cardiopulmonary disease.  Chronic patchy interstitial changes and emphysematous disease.   Electronically Signed   By: Marin Olp M.D.   On: 08/15/2013 14:20   Ct Head Wo Contrast  08/15/2013   CLINICAL DATA:  Severe headache.  EXAM: CT HEAD WITHOUT CONTRAST  TECHNIQUE: Contiguous axial images were obtained from the base of the skull through the vertex without intravenous contrast.  COMPARISON:  05/20/2013  FINDINGS: There is no evidence of intracranial hemorrhage, brain edema, or other signs of acute infarction. There is no evidence of intracranial mass lesion or mass effect. No abnormal extraaxial fluid collections  are identified.  Mild cerebral atrophy noted. Ventricles stable in size. No skull abnormality identified.  IMPRESSION: No acute intracranial findings.   Electronically Signed   By: Earle Gell M.D.   On: 08/15/2013 14:28    EKG Interpretation   None       MDM   1. URI (upper respiratory infection)   2. Anemia    Subacute illness for 2 weeks without worrisome clinical or evaluation findings. Doubt sinusitis, meningitis, pneumonia, bronchitis, or urinary tract infection. Incidental anemia, without evidence for blood loss. Screening for GI bleeding, negative on a single test.   Nursing Notes Reviewed/ Care Coordinated Applicable Imaging Reviewed Interpretation of Laboratory Data incorporated into ED treatment  The patient appears reasonably screened and/or stabilized for discharge and I doubt any other medical condition or other [facility] requiring further screening, evaluation,  or treatment in the ED at this time prior to discharge.  Plan: Home Medications- symptomatic for URI ; Home Treatments- Rest, fluids; return here if the recommended treatment, does not improve the symptoms; Recommended follow up- PCP 1 week, recheck Hb, and arrange further treatment.  I personally performed the services described in this documentation, which was scribed in my presence. The recorded information has been reviewed and is accurate.     [name] Blade, MD 08/15/13 [phone]

## 2013-09-02 ENCOUNTER — Other Ambulatory Visit: Payer: Self-pay

## 2013-09-02 MED ORDER — DONEPEZIL HCL 10 MG PO TABS: 10.0000 mg | ORAL_TABLET | Freq: Every day | ORAL | Status: DC

## 2013-09-15 ENCOUNTER — Ambulatory Visit: Payer: Medicare Other | Admitting: Podiatry

## 2013-09-22 ENCOUNTER — Encounter: Payer: Self-pay | Admitting: Podiatry

## 2013-09-22 ENCOUNTER — Ambulatory Visit (INDEPENDENT_AMBULATORY_CARE_PROVIDER_SITE_OTHER): Payer: Commercial Managed Care - HMO | Admitting: Podiatry

## 2013-09-22 VITALS — BP 134/78 | HR 84 | Resp 18

## 2013-09-22 DIAGNOSIS — B351 Tinea unguium: Secondary | ICD-10-CM

## 2013-09-22 DIAGNOSIS — M79609 Pain in unspecified limb: Secondary | ICD-10-CM

## 2013-09-23 NOTE — Progress Notes (Signed)
He presents today with a chief complaint of painful toenails one through 5 bilateral.  Objective: Vital signs are stable he is alert and oriented x3. Pulses are palpable bilateral. Nails are thick yellow dystrophic with mycotic and painful palpation..  Assessment: Pain in limb secondary to onychomycosis 1 through 5 bilateral.  Plan: Debridement of nails 1 through 5 bilateral covered service secondary to pain. 

## 2013-09-28 ENCOUNTER — Other Ambulatory Visit: Payer: Self-pay

## 2013-09-28 MED ORDER — ALPRAZOLAM 0.5 MG PO TABS: 0.5000 mg | ORAL_TABLET | Freq: Every day | ORAL | Status: DC | PRN

## 2013-10-01 ENCOUNTER — Ambulatory Visit: Payer: Medicare Other | Admitting: Podiatry

## 2013-10-01 NOTE — Telephone Encounter (Signed)
Rx has been faxed.

## 2013-10-08 ENCOUNTER — Ambulatory Visit: Payer: Medicare Other | Admitting: Nurse Practitioner

## 2013-10-23 ENCOUNTER — Other Ambulatory Visit: Payer: Medicare Other

## 2013-10-26 ENCOUNTER — Other Ambulatory Visit: Payer: Commercial Managed Care - HMO | Admitting: Lab

## 2013-10-26 ENCOUNTER — Ambulatory Visit: Payer: Commercial Managed Care - HMO | Admitting: Oncology

## 2013-11-27 ENCOUNTER — Encounter: Payer: Self-pay | Admitting: Critical Care Medicine

## 2013-11-27 ENCOUNTER — Ambulatory Visit (INDEPENDENT_AMBULATORY_CARE_PROVIDER_SITE_OTHER): Payer: Commercial Managed Care - HMO | Admitting: Critical Care Medicine

## 2013-11-27 VITALS — BP 144/80 | HR 78 | Temp 97.4°F | Ht 67.0 in | Wt 173.0 lb

## 2013-11-27 DIAGNOSIS — J441 Chronic obstructive pulmonary disease with (acute) exacerbation: Secondary | ICD-10-CM

## 2013-11-27 DIAGNOSIS — J9610 Chronic respiratory failure, unspecified whether with hypoxia or hypercapnia: Secondary | ICD-10-CM

## 2013-11-27 MED ORDER — ALBUTEROL SULFATE (2.5 MG/3ML) 0.083% IN NEBU: 2.5000 mg | INHALATION_SOLUTION | Freq: Four times a day (QID) | RESPIRATORY_TRACT | Status: DC | PRN

## 2013-11-27 MED ORDER — ARFORMOTEROL TARTRATE 15 MCG/2ML IN NEBU: 15.0000 ug | INHALATION_SOLUTION | Freq: Two times a day (BID) | RESPIRATORY_TRACT | Status: DC

## 2013-11-27 MED ORDER — COMPRESSOR/NEBULIZER MISC: Status: DC

## 2013-11-27 NOTE — Patient Instructions (Addendum)
Brovana twice daily in nebulizer  Albuterol as needed New nebulizer machine  Overnight oxygen test  Return 2 months

## 2013-11-27 NOTE — Progress Notes (Signed)
Subjective:    Patient ID: [name] [name], male    DOB: [birth year], 80 y.o.   MRN: [id number]  HPI   11/27/2013 Chief Complaint  Patient presents with  . Follow-up    chest congestion, prod cough with yellow mucus, runny nose, PND, sneezing, increased DOE, and chest soreness.  Symptoms have gradually worsened since Dec.   Notes more cough, congestion, chest pain anterior chest sharp, yellow mucus, nasal congestion.   Notes some QHS dyspnea and wheezing.  Out of symbicort along. Expense issue.   Review of Systems     Denies chest pain, leg swelling Objective:   Physical Exam  BP 144/80  Pulse 78  Temp(Src) 97.4 F (36.3 C) (Oral)  Ht 5\' 7"  (1.702 m)  Wt 173 lb (78.472 kg)  BMI 27.09 kg/m2  SpO2 100%  HEENT: Head is normocephalic and atraumatic. Extraocular muscles are intact. Pupils are equal, round, and reactive to light. Nares appeared normal. Mouth is well hydrated and without lesions. Mucous membranes are moist. Posterior pharynx clear of any exudate or lesions. Nasal drip noted on uvula NECK: Supple. No carotid bruits. No lymphadenopathy or thyromegaly. LUNGS: Clear to auscultation. Diminished breath sounds, no wheezing. HEART: Regular rate and rhythm without murmur.  ABDOMEN: Soft, nontender, and nondistended. Positive bowel sounds. No hepatosplenomegaly was noted.  EXTREMITIES: Without any cyanosis, clubbing, rash, lesions or edema.  NEUROLOGIC: Cranial nerves II through XII are grossly intact.  PSYCHIATRIC: normal affect SKIN: No ulceration or induration present.      Assessment & Plan:   Obstructive chronic bronchitis with exacerbation, Gold C Copd Gold C Copd with recurrent exacerbation, cannot afford HFA inhalers Plan Brovana twice daily in nebulizer via medicare DME  Albuterol as needed New nebulizer machine  Overnight oxygen test  Return 2 months     Updated Medication List Outpatient Encounter Prescriptions as of 11/27/2013  Medication Sig  .  acetaminophen (TYLENOL) 500 MG tablet Take 1,000 mg by mouth every 6 (six) hours as needed for headache.  . albuterol (PROVENTIL HFA;VENTOLIN HFA) 108 (90 BASE) MCG/ACT inhaler Inhale 2 puffs into the lungs every 6 (six) hours as needed for wheezing.  Marland Kitchen aspirin 325 MG tablet Take 325 mg by mouth daily.    . calcitRIOL (ROCALTROL) 0.25 MCG capsule Take 0.25 mcg by mouth daily.   . cholecalciferol (VITAMIN D) 1000 UNITS tablet Take 1,000 Units by mouth daily.  Marland Kitchen donepezil (ARICEPT) 10 MG tablet Take 1 tablet (10 mg total) by mouth at bedtime.  . finasteride (PROSCAR) 5 MG tablet Take 5 mg by mouth daily.   Marland Kitchen gabapentin (NEURONTIN) 600 MG tablet Take 600 mg by mouth 3 (three) times daily.  Marland Kitchen losartan (COZAAR) 50 MG tablet Take 1 tablet (50 mg total) by mouth daily.  Marland Kitchen lubiprostone (AMITIZA) 24 MCG capsule Take 24 mcg by mouth every other day. Taken only as needed  . Memantine HCl ER (NAMENDA XR) 28 MG CP24 Take 28 mg by mouth daily.  . mirtazapine (REMERON) 15 MG tablet Take 30 mg by mouth at bedtime.   . Multiple Vitamin (MULTIVITAMIN WITH MINERALS) TABS tablet Take 1 tablet by mouth daily.  . nitroGLYCERIN (NITROSTAT) 0.4 MG SL tablet Place 0.4 mg under the tongue every 5 (five) minutes as needed. Chest pain  . omeprazole (PRILOSEC) 40 MG capsule Take 40 mg by mouth 2 (two) times daily.   . promethazine (PHENERGAN) 25 MG tablet 1-2 tablets by mouth every 6 hours as needed for nausea  . sodium  bicarbonate 325 MG tablet Take 325 mg by mouth 2 (two) times daily.    . Tamsulosin HCl (FLOMAX) 0.4 MG CAPS Take 0.4 mg by mouth at bedtime.   . TRAVATAN Z 0.004 % SOLN ophthalmic solution Place 1 drop into both eyes At bedtime.  Marland Kitchen albuterol (PROVENTIL) (2.5 MG/3ML) 0.083% nebulizer solution Take 3 mLs (2.5 mg total) by nebulization every 6 (six) hours as needed for wheezing or shortness of breath.  Marland Kitchen arformoterol (BROVANA) 15 MCG/2ML NEBU Take 2 mLs (15 mcg total) by nebulization 2 (two) times daily.  .  fluticasone (FLONASE) 50 MCG/ACT nasal spray Place 1 spray into both nostrils daily.   . Nebulizers (COMPRESSOR/NEBULIZER) MISC Use with albuterol and brovana  . [DISCONTINUED] ALPRAZolam (XANAX) 0.5 MG tablet Take 1 tablet (0.5 mg total) by mouth daily as needed. anxiety  . [DISCONTINUED] antipyrine-benzocaine (AURALGAN) otic solution   . [DISCONTINUED] budesonide-formoterol (SYMBICORT) 160-4.5 MCG/ACT inhaler Inhale 2 puffs into the lungs 2 (two) times daily.  . [DISCONTINUED] dexlansoprazole (DEXILANT) 60 MG capsule Take 60 mg by mouth daily.  . [DISCONTINUED] gabapentin (NEURONTIN) 400 MG capsule Take 800 mg by mouth 3 (three) times daily.  . [DISCONTINUED] galantamine (RAZADYNE ER) 24 MG 24 hr capsule Take 1 capsule (24 mg total) by mouth daily with breakfast.

## 2013-11-28 NOTE — Assessment & Plan Note (Signed)
Gold C Copd with recurrent exacerbation, cannot afford HFA inhalers Plan Brovana twice daily in nebulizer via medicare Part B Albuterol as needed New nebulizer machine  Overnight oxygen test  Return 2 months

## 2013-11-30 ENCOUNTER — Telehealth: Payer: Self-pay | Admitting: Critical Care Medicine

## 2013-11-30 NOTE — Telephone Encounter (Signed)
Called and spoke with pharmacy at [facility] and they were given the dx code.  Nothing further is needed.

## 2013-12-04 ENCOUNTER — Telehealth: Payer: Self-pay | Admitting: Critical Care Medicine

## 2013-12-04 DIAGNOSIS — J441 Chronic obstructive pulmonary disease with (acute) exacerbation: Secondary | ICD-10-CM

## 2013-12-04 MED ORDER — ALBUTEROL SULFATE (2.5 MG/3ML) 0.083% IN NEBU: INHALATION_SOLUTION | RESPIRATORY_TRACT | Status: DC

## 2013-12-04 NOTE — Telephone Encounter (Signed)
Spouse aware of results. Voiced understanding and will inform pt. Nothing needed

## 2013-12-04 NOTE — Telephone Encounter (Signed)
Spoke with patient wife  Made aware of recs per PW Albuterol .083% neb Take 1 vial (64mL) 3-4 times qd #375 x 6RF sent to [facility] Rx cancelled in their system.  Nothing further needed.

## 2013-12-04 NOTE — Telephone Encounter (Signed)
Pt states that current neb meds are too expensive through [facility]-- $130/mo 11/27/13 Given Brovana 39mcg -Take BID   Pt requesting an alternative for this medication.   Please advise Dr Joya Gaskins. Thanks.

## 2013-12-04 NOTE — Telephone Encounter (Signed)
Only other one is albuterol in neb 3-4 times per day

## 2013-12-04 NOTE — Telephone Encounter (Signed)
Let patient know his ONO on RA was NORMAL> NO desaturation seen, he does not need oxygen.

## 2013-12-10 ENCOUNTER — Other Ambulatory Visit: Payer: Self-pay | Admitting: Family Medicine

## 2013-12-10 ENCOUNTER — Ambulatory Visit
Admission: RE | Admit: 2013-12-10 | Discharge: 2013-12-10 | Disposition: A | Discharge status: Home / Self Care | Payer: Commercial Managed Care - HMO | Source: Ambulatory Visit | Attending: Family Medicine | Admitting: Family Medicine

## 2013-12-10 DIAGNOSIS — R202 Paresthesia of skin: Secondary | ICD-10-CM

## 2013-12-15 ENCOUNTER — Telehealth: Payer: Self-pay | Admitting: *Deleted

## 2013-12-15 ENCOUNTER — Telehealth: Payer: Self-pay | Admitting: Oncology

## 2013-12-15 NOTE — Telephone Encounter (Signed)
pt wife called to r/s 4/6 lb/fu. wife given next available appt for 7/2 lb/BS. wife transferred to desk nurse to lm re issues pt having w/pain.

## 2013-12-15 NOTE — Telephone Encounter (Signed)
Call from pt's wife reporting he is having pain in his hands and feet. Spoke with pt, he reports pins and needle pain from feet to knees. Pain has been progressing for about one month. Having tingling and numbness in hands that extends to arms. No difficulty ambulating or moving arms. Pt reports low back pain. Having some constipation. Reports dysuria.  Pt reports he called PCP- was told to follow up with neurology and oncology. Pt rescheduled 10/2013 office visit. Currently scheduled for 7/2. Will review with Dr. Benay Spice.  Reviewed with Dr. Benay Spice: Have pt see neurologist. Called pt with instructions to call neurology. He wrote this down, will have wife call for visit.

## 2013-12-22 ENCOUNTER — Encounter: Payer: Self-pay | Admitting: Neurology

## 2013-12-22 ENCOUNTER — Ambulatory Visit (INDEPENDENT_AMBULATORY_CARE_PROVIDER_SITE_OTHER): Payer: Commercial Managed Care - HMO | Admitting: Neurology

## 2013-12-22 VITALS — BP 179/71 | HR 73 | Ht 67.5 in | Wt 177.2 lb

## 2013-12-22 DIAGNOSIS — F0280 Dementia in other diseases classified elsewhere without behavioral disturbance: Secondary | ICD-10-CM

## 2013-12-22 DIAGNOSIS — I2510 Atherosclerotic heart disease of native coronary artery without angina pectoris: Secondary | ICD-10-CM

## 2013-12-22 DIAGNOSIS — G309 Alzheimer's disease, unspecified: Principal | ICD-10-CM

## 2013-12-22 DIAGNOSIS — R209 Unspecified disturbances of skin sensation: Secondary | ICD-10-CM

## 2013-12-22 DIAGNOSIS — R202 Paresthesia of skin: Secondary | ICD-10-CM

## 2013-12-22 MED ORDER — NORTRIPTYLINE HCL 10 MG PO CAPS: ORAL_CAPSULE | ORAL | Status: DC

## 2013-12-22 NOTE — Progress Notes (Signed)
History of Present Illness  [name] is a 80 years old right-handed African American male, accompanied by his wife at today's clinical visit, he was a patient of Dr. Jules [name] since February 2009, for evaluation of memory trouble, his primary care physician is Dr. Ivery [name].  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 Some anxiety, improved on Remeron and prn Xanax.   He is occasionally irritable and easily provoked to anger. Wife  says that he has decreased appetite recently.  UPDATE Sept 19th 2014: He continues to complain mild slow worsening memory trouble, still very active at home, Today Mini-Mental Status is 23 out of 30, he wants more aggressive treatment, I have discussed with him the LZAX trial, he decided to proceed with it. I will also start Namenda 28 mammogram every day.  UPDATE June 2nd 2015: He now came in complaining of bilateral feet and hands paresthesia, which is new since Feb 2015, his symptoms are getting worse, he could hardly picking up things, he also complains of mild gait difficulty, low back pain, no bowel and bladder incontinence,   Recent laboratory evaluation in May 21st 2013, showed mild anemia, hemoglobin 11.8, glucose 96, B12 1004, normal TSH, protein electrophoresis showed elevated M spike a 1.3, mild  increased of creatinine 1.57, with GFR of 33, normal liver function tests previously. He is going to be evaluated by hematologist Dr. Judeen Hammans soon.  He complains of low back pain, no significant neck pain,  Review of Systems  Out of a complete 14 system review, the patient complains of only the following symptoms, and all other reviewed systems are negative.    Fatigue, swelling mass, ringing ears, blurry vision, shortness or breath, wheezing, constipation, urination problems, anemia, runny nose, not enough sleep.  Physical Exam  Neck: supple no carotid bruits Respiratory: clear to auscultation bilaterally Cardiovascular: regular rate rhythm  Neurologic Exam  Mental Status: pleasant, awake, alert, cooperative to history, talking, and casual conversation. Mini-Mental Status Examination 23 of 30, he has difficulty spell world backward, concentrate,  missed two out of three recalls, could not copy design. Cranial Nerves: CN II-XII pupils were equal round reactive to light.   Extraocular movements were full.  Visual fields were full on confrontational test.  Facial sensation and strength were normal.  Hearing was intact to finger rubbing bilaterally.  Uvula tongue were midline.  Head turning and shoulder shrugging were normal and symmetric.  Tongue protrusion into the cheeks strength were normal.  Motor: Normal tone, bulk, and strength. Sensory: Mildly length dependent decreased light touch, pinprick to ankle level Coordination: Normal finger-to-nose, heel-to-shin.  There was no dysmetria noticed. Gait and Station: Narrow based and steady  Romberg sign: Negative Reflexes: Deep tendon reflexes: Bilateral upper and lower extremity a brisk, with preserved ankle reflexes.  Plantar responses are flexor.  Assessment and Plan: Mr. Cortopassi  is a 80 year old, right handed, African-American male with history of memory loss. His neuropsychologic testing in 2009 indicated dementia. He has been taking and  tolerating Razadyne ER.   MMSE score today 23/30  1. he now complains of acute onset bilateral hands, and feet paresthesia, which is new since February 2015, he is taking gabapentin 600 mg 3 times a day with limited help, mild length dependent sensory changes, laboratory showed M spike, hematology evaluation is pending. 2, MRI of cervical, lumbar spine to rule out radiculopathies, EMG nerve conduction study  3. Return to clinic in one month,

## 2013-12-23 ENCOUNTER — Telehealth: Payer: Self-pay | Admitting: *Deleted

## 2013-12-23 NOTE — Telephone Encounter (Signed)
Per Dr. Benay Spice : Schedule OV with him on 12/30/13 at 0830. Wife notified and agrees.

## 2013-12-24 ENCOUNTER — Encounter (INDEPENDENT_AMBULATORY_CARE_PROVIDER_SITE_OTHER): Payer: Self-pay

## 2013-12-24 ENCOUNTER — Ambulatory Visit (INDEPENDENT_AMBULATORY_CARE_PROVIDER_SITE_OTHER): Payer: Commercial Managed Care - HMO | Admitting: Neurology

## 2013-12-24 ENCOUNTER — Other Ambulatory Visit: Payer: Self-pay

## 2013-12-24 DIAGNOSIS — R202 Paresthesia of skin: Secondary | ICD-10-CM

## 2013-12-24 DIAGNOSIS — I2510 Atherosclerotic heart disease of native coronary artery without angina pectoris: Secondary | ICD-10-CM

## 2013-12-24 DIAGNOSIS — R209 Unspecified disturbances of skin sensation: Secondary | ICD-10-CM

## 2013-12-24 DIAGNOSIS — Z0289 Encounter for other administrative examinations: Secondary | ICD-10-CM

## 2013-12-24 DIAGNOSIS — F0280 Dementia in other diseases classified elsewhere without behavioral disturbance: Secondary | ICD-10-CM

## 2013-12-24 DIAGNOSIS — G5600 Carpal tunnel syndrome, unspecified upper limb: Secondary | ICD-10-CM

## 2013-12-24 DIAGNOSIS — G309 Alzheimer's disease, unspecified: Principal | ICD-10-CM

## 2013-12-24 LAB — IFE AND PE, SERUM
ALPHA 1: 0.2 g/dL (ref 0.1–0.4)
ALPHA2 GLOB SERPL ELPH-MCNC: 0.8 g/dL (ref 0.4–1.2)
Albumin SerPl Elph-Mcnc: 3.8 g/dL (ref 3.2–5.6)
Albumin/Glob SerPl: 1.1 (ref 0.7–2.0)
B-GLOBULIN SERPL ELPH-MCNC: 1 g/dL (ref 0.6–1.3)
GLOBULIN, TOTAL: 3.5 g/dL (ref 2.0–4.5)
Gamma Glob SerPl Elph-Mcnc: 1.5 g/dL (ref 0.5–1.6)
IGM (IMMUNOGLOBULIN M), SRM: 111 mg/dL (ref 40–230)
IgA/Immunoglobulin A, Serum: 275 mg/dL (ref 91–414)
IgG (Immunoglobin G), Serum: 1690 mg/dL — ABNORMAL HIGH (ref 700–1600)
M PROTEIN SERPL ELPH-MCNC: 1.1 g/dL — AB
Total Protein: 7.3 g/dL (ref 6.0–8.5)

## 2013-12-24 LAB — RPR: RPR: NONREACTIVE

## 2013-12-24 LAB — HGB A1C W/O EAG: Hgb A1c MFr Bld: 6.5 % — ABNORMAL HIGH (ref 4.8–5.6)

## 2013-12-24 LAB — FOLATE

## 2013-12-24 MED ORDER — ALPRAZOLAM 0.5 MG PO TABS: 0.5000 mg | ORAL_TABLET | Freq: Every day | ORAL | Status: DC

## 2013-12-24 MED ORDER — MEMANTINE HCL ER 28 MG PO CP24: 28.0000 mg | ORAL_CAPSULE | Freq: Every day | ORAL | Status: DC

## 2013-12-24 NOTE — Procedures (Signed)
   NCS (NERVE CONDUCTION STUDY) WITH EMG (ELECTROMYOGRAPHY) REPORT   STUDY DATE: June fourth 2015 PATIENT NAME: [name] [name] DOB: [birth year] MRN: [id number]    TECHNOLOGIST: Laretta Alstrom ELECTROMYOGRAPHER: Marcial Pacas M.D.  CLINICAL INFORMATION:  80 years old [place] American male, with past medical history of memory loss, COPD, kidney disease, mild anemia presenting with subacute nset of bilateral hands, and feet paresthesia,  FINDINGS: NERVE CONDUCTION STUDY: Bilateral peroneal sensory responses were normal. Bilateral peroneal to EDB, and tibial motor responses were normal. Bilateral tibial H. reflexes were normal and symmetric. Bilateral ulnar sensory and motor responses were normal.  Bilateral median sensory response showed mildly prolonged peak latency, with normal snap amplitude. Bilateral median motor responses showed mildly prolonged distal latency, with normal C. map amplitude, conduction velocity, and F wave latency.  NEEDLE ELECTROMYOGRAPHY: Selected needle examination was performed at right lower extremity muscles, right lumbosacral paraspinal muscles, right upper extremity muscles  Right tibialis anterior, tibialis posterior, peroneal longus, medial gastrocnemius, increased insertion activity, no spontaneous activity, mild enlarged complex motor unit potential, with mildly decreased recruitment patterns.  Needle examination of peroneal longus, vastus lateralis was normal  Right extensor digital commonunis, biceps, triceps was normal  IMPRESSION:   This is a mild abnormal study. There is electrodiagnostic evidence of moderate bilateral carpal tunnel syndromes, chronic neuropathic changes involving right L4, L5, S1 myotomes, suggestive of chronic mild right lumbar radiculopathy. There is no evidence of large fiber peripheral neuropathy.   INTERPRETING PHYSICIAN:   Marcial Pacas M.D. Ph.D. [facility] [address], [place], [place]  [address] [phone]

## 2013-12-25 NOTE — Telephone Encounter (Signed)
Rx signed and faxed.

## 2013-12-28 ENCOUNTER — Other Ambulatory Visit: Payer: Self-pay | Admitting: *Deleted

## 2013-12-28 MED ORDER — ALBUTEROL SULFATE (2.5 MG/3ML) 0.083% IN NEBU: INHALATION_SOLUTION | RESPIRATORY_TRACT | Status: DC

## 2013-12-30 ENCOUNTER — Ambulatory Visit (HOSPITAL_BASED_OUTPATIENT_CLINIC_OR_DEPARTMENT_OTHER): Payer: Commercial Managed Care - HMO | Admitting: Oncology

## 2013-12-30 ENCOUNTER — Telehealth: Payer: Self-pay | Admitting: Oncology

## 2013-12-30 ENCOUNTER — Ambulatory Visit (HOSPITAL_BASED_OUTPATIENT_CLINIC_OR_DEPARTMENT_OTHER): Payer: Commercial Managed Care - HMO

## 2013-12-30 VITALS — BP 187/91 | HR 79 | Temp 97.7°F | Resp 18 | Ht 67.5 in | Wt 174.2 lb

## 2013-12-30 DIAGNOSIS — N189 Chronic kidney disease, unspecified: Secondary | ICD-10-CM

## 2013-12-30 DIAGNOSIS — J449 Chronic obstructive pulmonary disease, unspecified: Secondary | ICD-10-CM

## 2013-12-30 DIAGNOSIS — D649 Anemia, unspecified: Secondary | ICD-10-CM

## 2013-12-30 DIAGNOSIS — D472 Monoclonal gammopathy: Secondary | ICD-10-CM

## 2013-12-30 DIAGNOSIS — F0390 Unspecified dementia without behavioral disturbance: Secondary | ICD-10-CM

## 2013-12-30 DIAGNOSIS — G589 Mononeuropathy, unspecified: Secondary | ICD-10-CM

## 2013-12-30 LAB — CBC WITH DIFFERENTIAL/PLATELET
BASO%: 0.5 % (ref 0.0–2.0)
Basophils Absolute: 0 10*3/uL (ref 0.0–0.1)
EOS ABS: 0.1 10*3/uL (ref 0.0–0.5)
EOS%: 2.8 % (ref 0.0–7.0)
HCT: 35.5 % — ABNORMAL LOW (ref 38.4–49.9)
HGB: 11.7 g/dL — ABNORMAL LOW (ref 13.0–17.1)
LYMPH#: 1.9 10*3/uL (ref 0.9–3.3)
LYMPH%: 35.8 % (ref 14.0–49.0)
MCH: 32.3 pg (ref 27.2–33.4)
MCHC: 33 g/dL (ref 32.0–36.0)
MCV: 97.8 fL (ref 79.3–98.0)
MONO#: 0.3 10*3/uL (ref 0.1–0.9)
MONO%: 6.6 % (ref 0.0–14.0)
NEUT%: 54.3 % (ref 39.0–75.0)
NEUTROS ABS: 2.8 10*3/uL (ref 1.5–6.5)
PLATELETS: 203 10*3/uL (ref 140–400)
RBC: 3.64 10*6/uL — ABNORMAL LOW (ref 4.20–5.82)
RDW: 15.1 % — ABNORMAL HIGH (ref 11.0–14.6)
WBC: 5.2 10*3/uL (ref 4.0–10.3)

## 2013-12-30 LAB — COMPREHENSIVE METABOLIC PANEL (CC13)
ALBUMIN: 3.8 g/dL (ref 3.5–5.0)
ALT: 19 U/L (ref 0–55)
ANION GAP: 7 meq/L (ref 3–11)
AST: 28 U/L (ref 5–34)
Alkaline Phosphatase: 58 U/L (ref 40–150)
BUN: 19.8 mg/dL (ref 7.0–26.0)
CALCIUM: 9.5 mg/dL (ref 8.4–10.4)
CHLORIDE: 109 meq/L (ref 98–109)
CO2: 26 meq/L (ref 22–29)
Creatinine: 2.1 mg/dL — ABNORMAL HIGH (ref 0.7–1.3)
GLUCOSE: 97 mg/dL (ref 70–140)
Potassium: 5.2 mEq/L — ABNORMAL HIGH (ref 3.5–5.1)
SODIUM: 142 meq/L (ref 136–145)
TOTAL PROTEIN: 8.1 g/dL (ref 6.4–8.3)
Total Bilirubin: 0.27 mg/dL (ref 0.20–1.20)

## 2013-12-30 NOTE — Progress Notes (Signed)
  [facility] OFFICE PROGRESS NOTE   Diagnosis: Serum monoclonal protein  INTERVAL HISTORY:   He is referred by his primary physician secondary to progressive neuropathy symptoms. He complains of burning and numbness in the hands and feet. He has been evaluated by Dr. Krista Blue. This included nerve conduction studies that revealed evidence of bilateral carpal tunnel syndrome and chronic neuropathic changes involving the right L4, L5, and S1 myotomes suggesting lumbar radiculopathy. No evidence of large fiber peripheral neuropathy.  [name] [name] reports these symptoms have been present chronically but worsened over the past 4-5 months. He has been diagnosed with dementia.  Objective:  Vital signs in last 24 hours:  Blood pressure 187/91, pulse 79, temperature 97.7 F (36.5 C), temperature source Oral, resp. rate 18, height 5' 7.5" (1.715 m), weight 174 lb 3.2 oz (79.017 kg).    HEENT: Neck without mass Lymphatics: No cervical, supraclavicular, axillary, or inguinal nodes Resp: Lungs clear bilaterally Cardio: Regular rate and rhythm GI: No hepatosplenomegaly Vascular: No leg edema Neuro: Alert, follows commands, variable mild to moderate decrease in vibratory sense at the fingertips bilaterally      Lab Results:  Lab Results  Component Value Date   WBC 5.2 12/30/2013   HGB 11.7* 12/30/2013   HCT 35.5* 12/30/2013   MCV 97.8 12/30/2013   PLT 203 12/30/2013   NEUTROABS 2.8 12/30/2013   potassium 5.2, creatinine 2.1, albumin 3.8, total protein 8.1  Serum immunofixation 12/22/2013-IgG monoclonal lambda protein, IgG 1690, M protein 1.1  01/28/2006-IgG 1580, M spike 0.7 08/20/2007-IgG 1620, M spike 0.84 Medications: I have reviewed the patient's current medications.  Assessment/Plan: 1. 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 Stable. There is no clinical evidence for progression to multiple myeloma or another lymphoproliferative disorder. 2. Chronic  renal insufficiency. Followed by nephrology. 3. Mild anemia, stable. ? related to chronic renal insufficiency. 4. History of coronary artery disease. 5. History of prostatic hypertrophy.  6. Dementia 7. COPD 8. Neuropathy symptoms-most likely related to radiculopathy or carpal tunnel syndrome. It is possible the neuropathy symptoms are related to the monoclonal protein   Disposition:  [name] [name] has progressive neuropathy symptoms. He is followed by Dr. Krista Blue. He has a chronic monoclonal IgG lambda protein. There is no clinical evidence of progression to multiple myeloma. I will ask Dr. Krista Blue to contact me if she feels the neuropathy is related to the M. protein. The M spike has not changed significantly over many years. He does not have other findings to suggest progression to multiple myeloma. He has chronic renal insufficiency, that is most likely unrelated to the monoclonal protein.  He plans to proceed with the MRI evaluation as ordered by Dr. Krista Blue. He will return for an office visit here in 2 months.  Betsy Coder, MD  12/30/2013  4:19 PM

## 2013-12-30 NOTE — Telephone Encounter (Signed)
gv and printed appt sched and avs for pt for Aug....sent pt ot lab

## 2013-12-31 LAB — KAPPA/LAMBDA LIGHT CHAINS
Kappa free light chain: 1.62 mg/dL (ref 0.33–1.94)
Kappa:Lambda Ratio: 0.18 — ABNORMAL LOW (ref 0.26–1.65)
Lambda Free Lght Chn: 9.22 mg/dL — ABNORMAL HIGH (ref 0.57–2.63)

## 2014-01-05 ENCOUNTER — Ambulatory Visit: Payer: Commercial Managed Care - HMO | Admitting: Podiatry

## 2014-01-06 ENCOUNTER — Ambulatory Visit
Admission: RE | Admit: 2014-01-06 | Discharge: 2014-01-06 | Disposition: A | Discharge status: Home / Self Care | Payer: Commercial Managed Care - HMO | Source: Ambulatory Visit | Attending: Neurology | Admitting: Neurology

## 2014-01-06 DIAGNOSIS — R209 Unspecified disturbances of skin sensation: Secondary | ICD-10-CM

## 2014-01-06 DIAGNOSIS — I2510 Atherosclerotic heart disease of native coronary artery without angina pectoris: Secondary | ICD-10-CM

## 2014-01-06 DIAGNOSIS — R202 Paresthesia of skin: Secondary | ICD-10-CM

## 2014-01-06 DIAGNOSIS — G309 Alzheimer's disease, unspecified: Principal | ICD-10-CM

## 2014-01-06 DIAGNOSIS — F0280 Dementia in other diseases classified elsewhere without behavioral disturbance: Secondary | ICD-10-CM

## 2014-01-07 ENCOUNTER — Telehealth: Payer: Self-pay | Admitting: Neurology

## 2014-01-07 NOTE — Telephone Encounter (Signed)
Will review MRI findings in his July revisit.

## 2014-01-13 ENCOUNTER — Telehealth: Payer: Self-pay | Admitting: Neurology

## 2014-01-13 NOTE — Telephone Encounter (Signed)
Pt called wants someone to call him back concerning his MRI results, pt is having some problems and does not want to wait till July to go over the results, feels like something is going on. Please call pt back concerning this matter. Thanks

## 2014-01-14 NOTE — Telephone Encounter (Signed)
I have called him, he complains of worsening bilateral hands and feet paresthesia, no significant gait difficulty, incontinence, I have otherwise him to increase nortriptyline to 10 mg 1 in the morning, 2 at night time, continue followup in July,

## 2014-01-14 NOTE — Telephone Encounter (Signed)
Spoke with patient, informed that results have to reviewed by physician and will call back once ready. He verbalized understanding but is needing something for the pain and numbness in both fingers and feet and now pain is moving into the arms,can hardly touch fingers.The Nortriptyline-10mg   he is taking  is not working.

## 2014-01-21 ENCOUNTER — Other Ambulatory Visit: Payer: Commercial Managed Care - HMO

## 2014-01-21 ENCOUNTER — Ambulatory Visit: Payer: Commercial Managed Care - HMO | Admitting: Oncology

## 2014-01-25 ENCOUNTER — Ambulatory Visit (INDEPENDENT_AMBULATORY_CARE_PROVIDER_SITE_OTHER): Payer: Commercial Managed Care - HMO | Admitting: Neurology

## 2014-01-25 ENCOUNTER — Encounter: Payer: Self-pay | Admitting: Neurology

## 2014-01-25 VITALS — BP 121/72 | HR 100 | Ht 67.5 in | Wt 169.0 lb

## 2014-01-25 DIAGNOSIS — E785 Hyperlipidemia, unspecified: Secondary | ICD-10-CM

## 2014-01-25 DIAGNOSIS — I739 Peripheral vascular disease, unspecified: Secondary | ICD-10-CM

## 2014-01-25 DIAGNOSIS — J309 Allergic rhinitis, unspecified: Secondary | ICD-10-CM

## 2014-01-25 DIAGNOSIS — R209 Unspecified disturbances of skin sensation: Secondary | ICD-10-CM

## 2014-01-25 DIAGNOSIS — J441 Chronic obstructive pulmonary disease with (acute) exacerbation: Secondary | ICD-10-CM

## 2014-01-25 DIAGNOSIS — R202 Paresthesia of skin: Secondary | ICD-10-CM

## 2014-01-25 DIAGNOSIS — N259 Disorder resulting from impaired renal tubular function, unspecified: Secondary | ICD-10-CM

## 2014-01-25 MED ORDER — GABAPENTIN 300 MG PO CAPS: 300.0000 mg | ORAL_CAPSULE | Freq: Three times a day (TID) | ORAL | Status: DC

## 2014-01-25 NOTE — Progress Notes (Signed)
History of Present Illness:   [name] is a 80 years old right-handed African American male, accompanied by his wife at today's clinical visit, he was a patient of Dr. Jules Husbands since February 2009, for evaluation of memory trouble, his primary care physician is Dr. Ivery Quale.  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 Some anxiety, improved on Remeron and prn Xanax.   He is occasionally irritable and easily provoked to anger. Wife  says that he has decreased appetite recently.  UPDATE Sept 19th 2014: He continues to complain mild slow worsening memory trouble, still very active at home, Today Mini-Mental Status is 23 out of 30, he wants more aggressive treatment, I have discussed with him the LZAX trial, he decided to proceed with it. I will also start Namenda 28 mammogram every day.  UPDATE June 2nd 2015: He now came in complaining of bilateral feet and hands paresthesia, which is new since Feb 2015, his symptoms are getting worse, he could hardly picking up things, he also complains of mild gait difficulty, low back pain, no bowel and bladder incontinence,   Recent laboratory evaluation in May 21st 2013, showed mild anemia, hemoglobin 11.8, glucose 96, B12 1004, normal TSH, protein electrophoresis showed elevated M spike a  1.3, mild increased of creatinine 1.57, with GFR of 33, normal liver function tests previously. He is going to be evaluated by hematologist Dr. Judeen Hammans soon.  He also complains of low back pain, no significant neck pain,  UPDATE July 6th 2015:  He was evaluated by Dr. Benay Spice for chronic monoclonal IgG lambda protein. There is no clinical evidence of progression to multiple myeloma.  The M spike has not changed significantly over many years.  He still has numbness tinglings in his fingers, even to his arms and chest, frightening to him. We have reviewed MRI lumbar together, L5-S1: pseudo-disc bulging and facet hypertrophy with mild-moderate right and moderate left foraminal stenosis. L3-4: disc bulging and facet hypertrophy with mild biforaminal stenosis. L4-5: pseudo-disc bulging and facet hypertrophy with mild biforaminal stenosis.  notable for left renal cyst (5.7cm).    MRI cervical spine (without) demonstrating: C5-6: disc bulging and uncovertebral joint hypertrophy with severe right and moderate left foraminal stenosis.  Disc bulging and spondylosis from C4-5 to T2-3.  He has history of chronic renal disease, is going to be see by his nephrologist Dr. Posey Pronto in August 2015, I have suggested continued followup with his incidental finding of a left renal cyst with his nephrologist Dr. Posey Pronto  Review of Systems  Out of a complete 14 system review, the patient complains of only the following symptoms, and all other reviewed systems are negative.  Fatigue, ringing in ears, runny nose wheezing, shortness of breath, constipation, nausea, sleep talking, frequent urination    Physical Exam  Neck: supple no carotid bruits Respiratory: clear to  auscultation bilaterally Cardiovascular: regular rate rhythm  Neurologic Exam  Mental Status: pleasant, awake, alert, cooperative to history, talking, and casual conversation. Mini-Mental Status Examination 23 of 30, he has difficulty spell world backward,  concentrate,  missed two out of three recalls, could not copy design. Cranial Nerves: CN II-XII pupils were equal round reactive to light.   Extraocular movements were full.  Visual fields were full on confrontational test.  Facial sensation and strength were normal.  Hearing was intact to finger rubbing bilaterally.  Uvula tongue were midline.  Head turning and shoulder shrugging were normal and symmetric.  Tongue protrusion into the cheeks strength were normal.  Motor: Normal tone, bulk, and strength. Sensory: Mildly length dependent decreased light touch, pinprick to ankle level Coordination: Normal finger-to-nose, heel-to-shin.  There was no dysmetria noticed. Gait and Station: Narrow based and steady  Romberg sign: Negative Reflexes: Deep tendon reflexes: Bilateral upper and lower extremity a brisk, with preserved ankle reflexes.  Plantar responses are flexor.  Assessment and Plan: Mr. Holzman is a 80 year old, right handed, African-American male with history of memory loss. His neuropsychologic testing in 2009 indicated mild dementia. He has been taking and tolerating Ricep 45m qday and Namenda 28 milligram every day, MSE score today 21/30,  electrodiagnostic study showed no evidence of large fiber peripheral neuropathy, MRI lumbar,  L5-S1: pseudo-disc bulging and facet hypertrophy with mild-moderate right and moderate left foraminal stenosis. L3-4: disc bulging and facet hypertrophy with mild biforaminal stenosis. L4-5: pseudo-disc bulging and facet hypertrophy with mild biforaminal stenosis MRI cervical spine: C5-6: disc bulging and uncovertebral joint hypertrophy with severe right and moderate left foraminal stenosis. Disc bulging and spondylosis from C4-5 to T2-3.  1. he now complains of acute onset bilateral hands, and feet paresthesia, which is new since February 2015, he is taking gabapentin, he has history of chronic kidney disease, is going to be followed up by his nephrologist Dr. PPosey Pronto  MRI of the lumbar showed multilevel degenerative disc disease, incidental left kidney cyst, 5.7 cm, A printout was given to him, this will be continued followup by his nephrologist,  2I have refilled his prescription of Aricept, and Namenda, gabapentin, he will continue followup with his primary care physician, he has slow decline memory loss. Return to clinic for new issues

## 2014-01-27 ENCOUNTER — Telehealth: Payer: Self-pay | Admitting: Critical Care Medicine

## 2014-01-27 ENCOUNTER — Ambulatory Visit (INDEPENDENT_AMBULATORY_CARE_PROVIDER_SITE_OTHER)
Admission: RE | Admit: 2014-01-27 | Discharge: 2014-01-27 | Disposition: A | Discharge status: Home / Self Care | Payer: Commercial Managed Care - HMO | Source: Ambulatory Visit | Attending: Internal Medicine | Admitting: Internal Medicine

## 2014-01-27 ENCOUNTER — Encounter: Payer: Self-pay | Admitting: Internal Medicine

## 2014-01-27 ENCOUNTER — Encounter (INDEPENDENT_AMBULATORY_CARE_PROVIDER_SITE_OTHER): Payer: Self-pay

## 2014-01-27 ENCOUNTER — Ambulatory Visit (INDEPENDENT_AMBULATORY_CARE_PROVIDER_SITE_OTHER): Payer: Commercial Managed Care - HMO | Admitting: Internal Medicine

## 2014-01-27 ENCOUNTER — Other Ambulatory Visit (INDEPENDENT_AMBULATORY_CARE_PROVIDER_SITE_OTHER): Payer: Commercial Managed Care - HMO

## 2014-01-27 VITALS — BP 90/60 | HR 89 | Temp 97.7°F | Ht 67.5 in | Wt 170.0 lb

## 2014-01-27 DIAGNOSIS — R0600 Dyspnea, unspecified: Secondary | ICD-10-CM

## 2014-01-27 DIAGNOSIS — J441 Chronic obstructive pulmonary disease with (acute) exacerbation: Secondary | ICD-10-CM

## 2014-01-27 DIAGNOSIS — R0609 Other forms of dyspnea: Secondary | ICD-10-CM

## 2014-01-27 DIAGNOSIS — R0989 Other specified symptoms and signs involving the circulatory and respiratory systems: Secondary | ICD-10-CM

## 2014-01-27 DIAGNOSIS — N259 Disorder resulting from impaired renal tubular function, unspecified: Secondary | ICD-10-CM

## 2014-01-27 LAB — CBC WITH DIFFERENTIAL/PLATELET
Basophils Absolute: 0 10*3/uL (ref 0.0–0.1)
Basophils Relative: 0.2 % (ref 0.0–3.0)
Eosinophils Absolute: 0.2 10*3/uL (ref 0.0–0.7)
Eosinophils Relative: 3 % (ref 0.0–5.0)
HCT: 35.1 % — ABNORMAL LOW (ref 39.0–52.0)
Hemoglobin: 11.9 g/dL — ABNORMAL LOW (ref 13.0–17.0)
LYMPHS PCT: 33.3 % (ref 12.0–46.0)
Lymphs Abs: 2 10*3/uL (ref 0.7–4.0)
MCHC: 34 g/dL (ref 30.0–36.0)
MCV: 97.7 fl (ref 78.0–100.0)
MONOS PCT: 6.6 % (ref 3.0–12.0)
Monocytes Absolute: 0.4 10*3/uL (ref 0.1–1.0)
NEUTROS PCT: 56.9 % (ref 43.0–77.0)
Neutro Abs: 3.4 10*3/uL (ref 1.4–7.7)
PLATELETS: 204 10*3/uL (ref 150.0–400.0)
RBC: 3.59 Mil/uL — ABNORMAL LOW (ref 4.22–5.81)
RDW: 15.2 % (ref 11.5–15.5)
WBC: 6.1 10*3/uL (ref 4.0–10.5)

## 2014-01-27 LAB — BASIC METABOLIC PANEL
BUN: 27 mg/dL — ABNORMAL HIGH (ref 6–23)
CALCIUM: 9.8 mg/dL (ref 8.4–10.5)
CO2: 26 mEq/L (ref 19–32)
Chloride: 106 mEq/L (ref 96–112)
Creatinine, Ser: 3.2 mg/dL — ABNORMAL HIGH (ref 0.4–1.5)
GFR: 24.55 mL/min — ABNORMAL LOW (ref >60.00)
Glucose, Bld: 88 mg/dL (ref 70–99)
Potassium: 5.3 mEq/L — ABNORMAL HIGH (ref 3.5–5.1)
SODIUM: 138 meq/L (ref 135–145)

## 2014-01-27 LAB — BRAIN NATRIURETIC PEPTIDE: PRO B NATRI PEPTIDE: 21 pg/mL (ref 0.0–100.0)

## 2014-01-27 LAB — TSH: TSH: 0.62 u[IU]/mL (ref 0.35–4.50)

## 2014-01-27 MED ORDER — PREDNISONE 10 MG PO TABS: ORAL_TABLET | ORAL | Status: DC

## 2014-01-27 NOTE — Telephone Encounter (Signed)
PW pt. Spouse reports pt c/o ebing very SOB, wheezing, increase chest congestion, chest tx, feels weak x couple days. Wants him to be seen today. Nothing available with any provider. Dr. Melvyn Novas can pt be worked in to see you this afternoon? thanks

## 2014-01-27 NOTE — Progress Notes (Signed)
Subjective:    Patient ID: [name] [name], male    DOB: October 30, 1930, 80 y.o.   MRN: [id number]    History of Present Illness   11/27/2013 Chief Complaint  Patient presents with  . Follow-up    chest congestion, prod cough with yellow mucus, runny nose, PND, sneezing, increased DOE, and chest soreness.  Symptoms have gradually worsened since Dec.   Notes more cough, congestion, chest pain anterior chest sharp, yellow mucus, nasal congestion.   Notes some QHS dyspnea and wheezing.  Out of symbicort along. Expense issue.   rec Obstructive chronic bronchitis with exacerbation, Gold C Copd Gold C Copd with recurrent exacerbation, cannot afford HFA inhalers Plan Brovana twice daily in nebulizer via medicare DME > "too expensive" > stopped p one m Albuterol as needed New nebulizer machine  Overnight oxygen test      01/27/2014 acute ov/Mck[facility]on Glick re: ? aecopd Chief Complaint  Patient presents with  . Acute Visit    Pt c/o increased SOB x 2 wks. He also c/o chest tightness and cough- prod with moderate white sputum.   Comes to office 4 h p albuterol neb only some better but still feeling chest  tight, minimal rattling cough. Onset was insidious, pattern is slowly progressive.  Wife hears him wheezing and feels neb eliminates it "for a few min"  No obvious day to day or daytime variabilty or assoc  cp or  overt sinus or hb symptoms. No unusual exp hx or h/o childhood pna/ asthma or knowledge of premature birth.  Sleeping ok without nocturnal  or early am exacerbation  of respiratory  c/o's or need for noct saba. Also denies any obvious fluctuation of symptoms with weather or environmental changes or other aggravating or alleviating factors except as outlined above   Current Medications, Allergies, Complete Past Medical History, Past Surgical History, Family History, and Social History were reviewed in [employer] record.  ROS  The following are not active complaints unless  bolded sore throat, dysphagia, dental problems, itching, sneezing,  nasal congestion or excess/ purulent secretions, ear ache,   fever, chills, sweats, unintended wt loss, classically pleuritic or exertional cp, hemoptysis,  orthopnea pnd or leg swelling, presyncope, palpitations, heartburn, abdominal pain, anorexia, nausea, vomiting, diarrhea  or change in bowel or urinary habits, change in stools or urine, dysuria,hematuria,  rash, arthralgias, visual complaints, headache, numbness weakness or ataxia or problems with walking or coordination,  change in mood/affect or memory.           Objective:   Physical Exam  amb bm nad  Wt Readings from Last 3 Encounters:  01/27/14 170 lb (77.111 kg)  01/25/14 169 lb (76.658 kg)  12/30/13 174 lb 3.2 oz (79.017 kg)      HEENT: Head is normocephalic and atraumatic. Extraocular muscles are intact. Pupils are equal, round, and reactive to light. Nares appeared normal. Mouth is well hydrated and without lesions. Mucous membranes are moist. Posterior pharynx clear of any exudate or lesions.   NECK: Supple. No carotid bruits. No lymphadenopathy or thyromegaly. LUNGS: Clear to auscultation. Diminished breath sounds, no wheezing. HEART: Regular rate and rhythm without murmur. Exquisite tenderness/ flinching with light touch anywhere near CABG scar. ABDOMEN: Soft, nontender, and nondistended. Positive bowel sounds. No hepatosplenomegaly was noted.  EXTREMITIES: Without any cyanosis, clubbing, rash, lesions or edema.  NEUROLOGIC: Cranial nerves II through XII are grossly intact.  PSYCHIATRIC: normal affect SKIN: No ulceration or induration present.  CXR  01/27/2014 :  Changes from CABG surgery are stable. Cardiac silhouette is normal in size. No mediastinal or hilar masses. Right-sided lung scarring with milder left lung base scarring, also stable. No lung consolidation or edema. No mass or nodule. No pleural effusion or pneumothorax.  Bony thorax is  demineralized but grossly intact.   Recent Labs Lab 01/27/14 1408  NA 138  K 5.3*  CL 106  CO2 26  BUN 27*  CREATININE 3.2*  GLUCOSE 88    Recent Labs Lab 01/27/14 1408  HGB 11.9*  HCT 35.1*  WBC 6.1  PLT 204.0     Lab Results  Component Value Date   PROBNP 21.0 01/27/2014        Assessment & Plan:

## 2014-01-27 NOTE — Telephone Encounter (Signed)
I called spoke w/ spouse. She reports he does not feel SOB resting. Pt scheduled to come in and see MW at 1:30 this afternoon and aware to bring all meds in hand. Nothing further needed

## 2014-01-27 NOTE — Progress Notes (Signed)
Quick Note:  Spoke with pt and notified of results per Dr. Wert. Pt verbalized understanding and denied any questions.  ______ 

## 2014-01-27 NOTE — Telephone Encounter (Signed)
Ok to work in bring all meds, if really bad (sob at rest p uses meds or not able to take po well ) >> go on to er i

## 2014-01-27 NOTE — Patient Instructions (Addendum)
Prednisone 10 mg take  4 each am x 2 days,   2 each am x 2 days,  1 each am x 2 days and stop   Please remember to go to the lab and x-ray department downstairs for your tests - we will call you with the results when they are available.    GERD (REFLUX)  is an extremely common cause of respiratory symptoms, many times with no significant heartburn at all.    It can be treated with medication, but also with lifestyle changes including avoidance of late meals, excessive alcohol, smoking cessation, and avoid fatty foods, chocolate, peppermint, colas, red wine, and acidic juices such as orange juice.  NO MINT OR MENTHOL PRODUCTS SO NO COUGH DROPS  USE SUGARLESS CANDY INSTEAD (jolley ranchers or Stover's)  NO OIL BASED VITAMINS - use powdered substitutes.    Keep the appt to see Dr Joya Gaskins but bring all medications from all the doctors  lated add : stop cozar due to creat/ beware polypharmacy as he has many sources of meds none of whom are on epic ? Needs ICS (budesonide) added to alb

## 2014-01-28 NOTE — Assessment & Plan Note (Addendum)
DDX of  difficult airways management all start with A and  include Adherence, Ace Inhibitors, Acid Reflux, Active Sinus Disease, Alpha 1 Antitripsin deficiency, Anxiety masquerading as Airways dz,  ABPA,  allergy(esp in young), Aspiration (esp in elderly), Adverse effects of DPI,  Active smokers, plus two Bs  = Bronchiectasis and Beta blocker use..and one C= CHF   Adherence is always the initial "prime suspect" and is a multilayered concern that requires a "trust but verify" approach in every patient - starting with knowing how to use medications, especially inhalers, correctly, keeping up with refills and understanding the fundamental difference between maintenance and prns vs those medications only taken for a very short course and then stopped and not refilled.  - not able to afford brovana, so just using alb ? Needs ICS> address with dr Lamar Benes f/u July 10 scheduled  ?allegy/asthma > Prednisone 10 mg take  4 each am x 2 days,   2 each am x 2 days,  1 each am x 2 days and stop ? Needs ICS (budesonide) added to alb  ? Acid (or non-acid) GERD > always difficult to exclude as up to 75% of pts in some series report no assoc GI/ Heartburn symptoms> rec max (24h)  acid suppression and diet restrictions/ reviewed and instructions given in writing.    ? ACEi/ generic cozar side effect. For reasons that may related to vascular permability and nitric oxide pathways but not elevated  bradykinin levels (as seen with  ACEi use) losartan in the generic form has been reported now from mulitple sources  to cause a similar pattern of non-specific  upper airway symptoms as seen with acei.   This has not been reported with exposure to the other ARB's to date, so it seems reasonable for now to try either generic diovan or avapro if ARB needed or use an alternative class altogether.  See:  Lelon Frohlich Allergy Asthma Immunol  2008: 101: p [phone]   - needs to d/c cozar anyway at this point due to rising creatinine (see CRI)  ?  Anxiety > usually dx of exlcusion but not he's completely clear 4 h p alb and walked 3 laps s difficulty  ? chf > very unlikely with bnp << 100    Each maintenance medication was reviewed in detail including most importantly the difference between maintenance and as needed and under what circumstances the prns are to be used.  Please see instructions for details which were reviewed in writing and the patient given a copy.

## 2014-01-28 NOTE — Assessment & Plan Note (Signed)
-   01/27/2014  Walked RA x 3 laps @ 185 ft each stopped due to end of study, no sob or desats  Note deterioration in renal fxn but not hc03 or hct, not clear what's driving symptoms that are not reproducible here

## 2014-01-28 NOTE — Assessment & Plan Note (Signed)
Stage III CKD. D/c cozar 01/28/2014  Lab Results  Component Value Date   CREATININE 3.2* 01/27/2014   CREATININE 2.1* 12/30/2013   CREATININE 1.73* 08/15/2013   CREATININE 1.62* 11/05/2012   CREATININE 1.6* 04/14/2012

## 2014-02-03 ENCOUNTER — Encounter (HOSPITAL_COMMUNITY): Payer: Self-pay | Admitting: Emergency Medicine

## 2014-02-03 ENCOUNTER — Ambulatory Visit: Payer: Self-pay | Admitting: Critical Care Medicine

## 2014-02-03 ENCOUNTER — Emergency Department (HOSPITAL_COMMUNITY): Payer: Medicare HMO

## 2014-02-03 ENCOUNTER — Emergency Department (HOSPITAL_COMMUNITY)
Admission: EM | Admit: 2014-02-03 | Discharge: 2014-02-04 | Disposition: A | Discharge status: Home / Self Care | Payer: Medicare HMO | Attending: Emergency Medicine | Admitting: Emergency Medicine

## 2014-02-03 DIAGNOSIS — N289 Disorder of kidney and ureter, unspecified: Secondary | ICD-10-CM | POA: Insufficient documentation

## 2014-02-03 DIAGNOSIS — G309 Alzheimer's disease, unspecified: Secondary | ICD-10-CM | POA: Insufficient documentation

## 2014-02-03 DIAGNOSIS — R05 Cough: Secondary | ICD-10-CM | POA: Insufficient documentation

## 2014-02-03 DIAGNOSIS — R059 Cough, unspecified: Secondary | ICD-10-CM | POA: Insufficient documentation

## 2014-02-03 DIAGNOSIS — R079 Chest pain, unspecified: Secondary | ICD-10-CM

## 2014-02-03 DIAGNOSIS — N400 Enlarged prostate without lower urinary tract symptoms: Secondary | ICD-10-CM | POA: Insufficient documentation

## 2014-02-03 DIAGNOSIS — Z951 Presence of aortocoronary bypass graft: Secondary | ICD-10-CM | POA: Insufficient documentation

## 2014-02-03 DIAGNOSIS — E785 Hyperlipidemia, unspecified: Secondary | ICD-10-CM | POA: Insufficient documentation

## 2014-02-03 DIAGNOSIS — I2510 Atherosclerotic heart disease of native coronary artery without angina pectoris: Secondary | ICD-10-CM | POA: Insufficient documentation

## 2014-02-03 DIAGNOSIS — R61 Generalized hyperhidrosis: Secondary | ICD-10-CM | POA: Insufficient documentation

## 2014-02-03 DIAGNOSIS — Z7982 Long term (current) use of aspirin: Secondary | ICD-10-CM | POA: Insufficient documentation

## 2014-02-03 DIAGNOSIS — J4489 Other specified chronic obstructive pulmonary disease: Secondary | ICD-10-CM | POA: Insufficient documentation

## 2014-02-03 DIAGNOSIS — J449 Chronic obstructive pulmonary disease, unspecified: Secondary | ICD-10-CM | POA: Insufficient documentation

## 2014-02-03 DIAGNOSIS — IMO0002 Reserved for concepts with insufficient information to code with codable children: Secondary | ICD-10-CM | POA: Insufficient documentation

## 2014-02-03 DIAGNOSIS — I10 Essential (primary) hypertension: Secondary | ICD-10-CM | POA: Insufficient documentation

## 2014-02-03 DIAGNOSIS — Z79899 Other long term (current) drug therapy: Secondary | ICD-10-CM | POA: Insufficient documentation

## 2014-02-03 DIAGNOSIS — K219 Gastro-esophageal reflux disease without esophagitis: Secondary | ICD-10-CM | POA: Insufficient documentation

## 2014-02-03 DIAGNOSIS — F0280 Dementia in other diseases classified elsewhere without behavioral disturbance: Secondary | ICD-10-CM | POA: Insufficient documentation

## 2014-02-03 DIAGNOSIS — Z862 Personal history of diseases of the blood and blood-forming organs and certain disorders involving the immune mechanism: Secondary | ICD-10-CM | POA: Insufficient documentation

## 2014-02-03 LAB — URINALYSIS, ROUTINE W REFLEX MICROSCOPIC
Glucose, UA: NEGATIVE mg/dL
Hgb urine dipstick: NEGATIVE
Ketones, ur: NEGATIVE mg/dL
Leukocytes, UA: NEGATIVE
Nitrite: NEGATIVE
PH: 5 (ref 5.0–8.0)
Protein, ur: NEGATIVE mg/dL
Specific Gravity, Urine: 1.015 (ref 1.005–1.030)
Urobilinogen, UA: 0.2 mg/dL (ref 0.0–1.0)

## 2014-02-03 LAB — COMPREHENSIVE METABOLIC PANEL
ALT: 16 U/L (ref 0–53)
AST: 17 U/L (ref 0–37)
Albumin: 3.4 g/dL — ABNORMAL LOW (ref 3.5–5.2)
Alkaline Phosphatase: 58 U/L (ref 39–117)
Anion gap: 9 (ref 5–15)
BUN: 35 mg/dL — ABNORMAL HIGH (ref 6–23)
CHLORIDE: 104 meq/L (ref 96–112)
CO2: 27 meq/L (ref 19–32)
CREATININE: 2.67 mg/dL — AB (ref 0.50–1.35)
Calcium: 9.5 mg/dL (ref 8.4–10.5)
GFR, EST AFRICAN AMERICAN: 24 mL/min — AB (ref >90)
GFR, EST NON AFRICAN AMERICAN: 21 mL/min — AB (ref >90)
GLUCOSE: 130 mg/dL — AB (ref 70–99)
Potassium: 4.5 mEq/L (ref 3.7–5.3)
Sodium: 140 mEq/L (ref 137–147)
Total Protein: 6.8 g/dL (ref 6.0–8.3)

## 2014-02-03 LAB — TROPONIN I

## 2014-02-03 LAB — CBC WITH DIFFERENTIAL/PLATELET
Basophils Absolute: 0 10*3/uL (ref 0.0–0.1)
Basophils Relative: 0 % (ref 0–1)
Eosinophils Absolute: 0.3 10*3/uL (ref 0.0–0.7)
Eosinophils Relative: 4 % (ref 0–5)
HEMATOCRIT: 31.6 % — AB (ref 39.0–52.0)
HEMOGLOBIN: 11 g/dL — AB (ref 13.0–17.0)
LYMPHS ABS: 3.1 10*3/uL (ref 0.7–4.0)
LYMPHS PCT: 44 % (ref 12–46)
MCH: 34.2 pg — ABNORMAL HIGH (ref 26.0–34.0)
MCHC: 34.8 g/dL (ref 30.0–36.0)
MCV: 98.1 fL (ref 78.0–100.0)
MONO ABS: 0.4 10*3/uL (ref 0.1–1.0)
MONOS PCT: 5 % (ref 3–12)
NEUTROS ABS: 3.4 10*3/uL (ref 1.7–7.7)
Neutrophils Relative %: 47 % (ref 43–77)
Platelets: 167 10*3/uL (ref 150–400)
RBC: 3.22 MIL/uL — AB (ref 4.22–5.81)
RDW: 14.6 % (ref 11.5–15.5)
WBC: 7.1 10*3/uL (ref 4.0–10.5)

## 2014-02-03 LAB — I-STAT CG4 LACTIC ACID, ED: Lactic Acid, Venous: 1.29 mmol/L (ref 0.5–2.2)

## 2014-02-03 MED ORDER — SODIUM CHLORIDE 0.9 % IV SOLN
1000.0000 mL | Freq: Once | INTRAVENOUS | Status: AC
  Administered 2014-02-03: 1000 mL via INTRAVENOUS

## 2014-02-03 MED ORDER — SODIUM CHLORIDE 0.9 % IV SOLN: 1000.0000 mL | INTRAVENOUS | Status: DC

## 2014-02-03 NOTE — ED Notes (Signed)
Wife states that patient's BP has been low today and that patient has not been feeling well x 1 week.

## 2014-02-03 NOTE — ED Provider Notes (Signed)
CSN: [id number]     Arrival date & time 02/03/14  2116 History  This chart was scribed for Delora Fuel, MD by Lowella Petties, ED Scribe. The patient was seen in room APA04/APA04. Patient's care was started at 11:10 PM.     Chief Complaint  Patient presents with  . Hypotension   The history is provided by the patient and the spouse. No language interpreter was used.   HPI Comments: [name] [name] is a 80 y.o. male with a history of HTN, CAD, COPD, and Dementia who presents to the Emergency Department complaining of intermittent chest pain onset 2 or 3 months ago and worsened tonight. His wife states that he has been disoriented and had frequent urination for the past week. She reports that he has had low BP. She reports diaphoresis, red eyes, and cough. She also reports pain in his feet and hands, and a recent MRI that showed a cyst on his kidney.   PCP: Gara Kroner, MD   Past Medical History  Diagnosis Date  . Renal insufficiency   . PVD (peripheral vascular disease)   . Hypertension   . Hyperlipemia   . GERD (gastroesophageal reflux disease)   . CAD (coronary artery disease)   . COPD (chronic obstructive pulmonary disease)   . Monoclonal gammopathy   . BPH (benign prostatic hyperplasia)   . DEMENTIA   . Alzheimer disease   . Memory loss    Past Surgical History  Procedure Laterality Date  . Coronary artery bypass graft  1997  . Left cea  2002  . Back surgery  2011  . Cataract extraction w/phaco  09/18/2011    Procedure: CATARACT EXTRACTION PHACO AND INTRAOCULAR LENS PLACEMENT (IOC);  Surgeon: Elta Guadeloupe T. Gershon Crane, MD;  Location: [facility];  Service: Ophthalmology;  Laterality: Right;  CDE: 6.19  . Cardiac surgery     Family History  Problem Relation Age of Onset  . Anesthesia problems Neg Hx   . Hypotension Neg Hx   . Malignant hyperthermia Neg Hx   . Pseudochol deficiency Neg Hx   . High blood pressure    . High Cholesterol     History  Substance Use Topics  . Smoking  status: Former Smoker -- 0.50 packs/day for 40 years    Types: Cigarettes    Quit date: 07/23/1978  . Smokeless tobacco: Never Used  . Alcohol Use: No    Review of Systems  Constitutional: Positive for diaphoresis.  Respiratory: Positive for cough.   Genitourinary: Positive for frequency.  All other systems reviewed and are negative.     Allergies  Review of patient's allergies indicates no known allergies.  Home Medications   Prior to Admission medications   Medication Sig Start Date End Date Taking? Authorizing Provider  acetaminophen (TYLENOL) 500 MG tablet Take 1,000 mg by mouth every 6 (six) hours as needed for headache.    Historical Provider, MD  albuterol (PROVENTIL HFA;VENTOLIN HFA) 108 (90 BASE) MCG/ACT inhaler Inhale 2 puffs into the lungs every 6 (six) hours as needed for wheezing. 08/12/12   Elsie Stain, MD  albuterol (PROVENTIL) (2.5 MG/3ML) 0.083% nebulizer solution Take 1 vial (65mL) 3-4 times per day. 12/28/13   Elsie Stain, MD  ALPRAZolam Duanne Moron) 0.5 MG tablet Take 1 tablet (0.5 mg total) by mouth daily. If needed 12/24/13   Marcial Pacas, MD  arformoterol (BROVANA) 15 MCG/2ML NEBU Take 2 mLs (15 mcg total) by nebulization 2 (two) times daily. 11/27/13   Saralyn Pilar  Georgiana Shore, MD  aspirin 325 MG tablet Take 325 mg by mouth daily.      Historical Provider, MD  calcitRIOL (ROCALTROL) 0.25 MCG capsule Take 0.25 mcg by mouth daily.  11/12/12   Historical Provider, MD  cholecalciferol (VITAMIN D) 1000 UNITS tablet Take 1,000 Units by mouth daily.    Historical Provider, MD  donepezil (ARICEPT) 10 MG tablet Take 1 tablet (10 mg total) by mouth at bedtime. 09/02/13   Marcial Pacas, MD  finasteride (PROSCAR) 5 MG tablet Take 5 mg by mouth daily.     Historical Provider, MD  fluticasone (FLONASE) 50 MCG/ACT nasal spray Place 1 spray into both nostrils daily.  08/18/13   Historical Provider, MD  gabapentin (NEURONTIN) 300 MG capsule Take 1 capsule (300 mg total) by mouth 3 (three) times  daily. 01/25/14   Marcial Pacas, MD  latanoprost (XALATAN) 0.005 % ophthalmic solution  12/28/13   Historical Provider, MD  losartan (COZAAR) 50 MG tablet Take 1 tablet (50 mg total) by mouth daily. 10/24/11   Elsie Stain, MD  lubiprostone (AMITIZA) 24 MCG capsule Take 24 mcg by mouth every other day. Taken only as needed    Historical Provider, MD  Memantine HCl ER (NAMENDA XR) 28 MG CP24 Take 28 mg by mouth daily. 12/24/13   Marcial Pacas, MD  mirtazapine (REMERON) 15 MG tablet Take 30 mg by mouth at bedtime.     Historical Provider, MD  Multiple Vitamin (MULTIVITAMIN WITH MINERALS) TABS tablet Take 1 tablet by mouth daily.    Historical Provider, MD  Nebulizers (COMPRESSOR/NEBULIZER) MISC Use with albuterol and brovana 11/27/13   Elsie Stain, MD  nitroGLYCERIN (NITROSTAT) 0.4 MG SL tablet Place 0.4 mg under the tongue every 5 (five) minutes as needed. Chest pain    Historical Provider, MD  nortriptyline (PAMELOR) 10 MG capsule One po qhs xone week, then 2 tabs po qhs 12/22/13   Marcial Pacas, MD  omeprazole (PRILOSEC) 40 MG capsule Take 40 mg by mouth 2 (two) times daily.  09/07/13   Historical Provider, MD  predniSONE (DELTASONE) 10 MG tablet Take  4 each am x 2 days,   2 each am x 2 days,  1 each am x 2 days and stop 01/27/14   Tanda Rockers, MD  promethazine (PHENERGAN) 25 MG tablet 1-2 tablets by mouth every 6 hours as needed for nausea    Historical Provider, MD  sodium bicarbonate 325 MG tablet Take 325 mg by mouth 2 (two) times daily.      Historical Provider, MD  Tamsulosin HCl (FLOMAX) 0.4 MG CAPS Take 0.4 mg by mouth at bedtime.     Historical Provider, MD  TRAVATAN Z 0.004 % SOLN ophthalmic solution Place 1 drop into both eyes At bedtime.    Historical Provider, MD   Triage Vitals: BP 122/54  Pulse 76  Temp(Src) 97.8 F (36.6 C) (Oral)  Resp 16  Ht 5\' 7"  (1.702 m)  Wt 174 lb (78.926 kg)  BMI 27.25 kg/m2  SpO2 100% Physical Exam  Nursing note and vitals reviewed. Constitutional: He appears  well-developed and well-nourished. No distress.  HENT:  Head: Normocephalic and atraumatic.  Eyes: Conjunctivae and EOM are normal. Pupils are equal, round, and reactive to light.  Neck: Normal range of motion. Neck supple. No JVD present. No tracheal deviation present.  Cardiovascular: Normal rate, regular rhythm and normal heart sounds.   No murmur heard. Pulmonary/Chest: Effort normal and breath sounds normal. No respiratory distress. He  has no wheezes. He has no rales. He exhibits tenderness (moderate on sternal and parasternal areas bilaterally).  Abdominal: Soft. Bowel sounds are normal. He exhibits no distension and no mass. There is tenderness (mild epigastric).  Musculoskeletal: Normal range of motion. He exhibits no edema.  Lymphadenopathy:    He has no cervical adenopathy.  Neurological: He is alert. He has normal reflexes. No cranial nerve deficit. Coordination abnormal.  Oriented to person and place but not time.  Skin: Skin is warm and dry. No rash noted.  Psychiatric: He has a normal mood and affect. His behavior is normal.    ED Course  Procedures (including critical care time) DIAGNOSTIC STUDIES: Oxygen Saturation is 100% on room air, normal by my interpretation.    COORDINATION OF CARE: 11:16 PM-Discussed treatment plan which includes UA and blood work with pt at bedside and pt agreed to plan.   Labs Review Labs Reviewed  CBC WITH DIFFERENTIAL - Abnormal; Notable for the following:    RBC 3.22 (*)    Hemoglobin 11.0 (*)    HCT 31.6 (*)    MCH 34.2 (*)    All other components within normal limits  COMPREHENSIVE METABOLIC PANEL - Abnormal; Notable for the following:    Glucose, Bld 130 (*)    BUN 35 (*)    Creatinine, Ser 2.67 (*)    Albumin 3.4 (*)    Total Bilirubin <0.2 (*)    GFR calc non Af Amer 21 (*)    GFR calc Af Amer 24 (*)    All other components within normal limits  TROPONIN I  URINALYSIS, ROUTINE W REFLEX MICROSCOPIC    Imaging Review No  results found.   EKG Interpretation   Date/Time:  Wednesday February 03 2014 23:08:21 EDT Ventricular Rate:  72 PR Interval:  162 QRS Duration: 97 QT Interval:  379 QTC Calculation: 415 R Axis:   48 Text Interpretation:  Sinus rhythm Minimal ST elevation, inferior leads  Baseline wander in lead(s) II III aVR aVL aVF When compared with ECG of  11/05/2012, No significant change was found Confirmed by [facility]  MD, Markella Dao  ([id number]) on 02/03/2014 11:15:38 PM      MDM   Final diagnoses:  Chest pain, unspecified chest pain type  Renal insufficiency    Chest pain which is probably musculoskeletal based on chest wall tenderness. Hypertension which appears to resolve. Old records are reviewed and he actually had borderline hypertension at his last office visit but prior blood pressures have been in normal and even hypertensive ranges. He also is noted to have renal insufficiency which was worse when he was seen at his physician's office last week. He was given gentle IV hydration and a screening labs and ECG obtained.  ECG is unremarkable. Creatinine is actually improved compared with recent value obtained as an outpatient. Orthostatic vital signs are unremarkable. He is given reassurance and discharged with instructions to followup with PCP in the next several days to week.  I personally performed the services described in this documentation, which was scribed in my presence. The recorded information has been reviewed and is accurate.     Delora Fuel, MD 38/10/17 [id number]

## 2014-02-03 NOTE — ED Notes (Signed)
Pt states he does not know why he is here. Pt denies any pain. Pt's wife states he has not been eating well and losing weight.

## 2014-02-04 NOTE — Discharge Instructions (Signed)

## 2014-02-15 ENCOUNTER — Ambulatory Visit: Payer: Commercial Managed Care - HMO | Admitting: Critical Care Medicine

## 2014-02-23 ENCOUNTER — Encounter: Payer: Self-pay | Admitting: Critical Care Medicine

## 2014-02-23 ENCOUNTER — Other Ambulatory Visit: Payer: Commercial Managed Care - HMO

## 2014-02-23 ENCOUNTER — Ambulatory Visit: Payer: Commercial Managed Care - HMO | Admitting: Oncology

## 2014-03-16 ENCOUNTER — Other Ambulatory Visit (HOSPITAL_COMMUNITY): Payer: Self-pay | Admitting: Urology

## 2014-03-16 DIAGNOSIS — N281 Cyst of kidney, acquired: Secondary | ICD-10-CM

## 2014-03-25 ENCOUNTER — Telehealth: Payer: Self-pay | Admitting: Neurology

## 2014-03-25 NOTE — Telephone Encounter (Signed)
I called back to clarify what medication they are referring to.  Spoke with Ms Elsass.  The co-pay on Namenda has gone up due to coverage gap.  I provided the number for Patient Assistance, [phone].  They will contact the program and call us back if anything further is needed.

## 2014-03-25 NOTE — Telephone Encounter (Signed)
Patient's wife [name] [name] calling to state that she has questions regarding his medication, patient is in the coverage gap and it will be very expensive, please call and advise.

## 2014-03-26 ENCOUNTER — Telehealth: Payer: Self-pay | Admitting: Neurology

## 2014-03-26 NOTE — Telephone Encounter (Signed)
Patient's wife Peter Congo calling to ask some questions regarding the application for the Namenda prescription program, wants to make sure she is filling out the form right, please call and advise.

## 2014-03-26 NOTE — Telephone Encounter (Signed)
Please advise. Thanks.  

## 2014-03-26 NOTE — Telephone Encounter (Signed)
I called back.  They just wanted to let us know they obtained the application and have completed it.  They will be placing it in the mail and will contact us if anything further is needed.

## 2014-03-30 ENCOUNTER — Ambulatory Visit (HOSPITAL_COMMUNITY)
Admission: RE | Admit: 2014-03-30 | Discharge: 2014-03-30 | Disposition: A | Discharge status: Home / Self Care | Payer: Medicare HMO | Source: Ambulatory Visit | Attending: Urology | Admitting: Urology

## 2014-03-30 DIAGNOSIS — I714 Abdominal aortic aneurysm, without rupture, unspecified: Secondary | ICD-10-CM | POA: Diagnosis not present

## 2014-03-30 DIAGNOSIS — N281 Cyst of kidney, acquired: Secondary | ICD-10-CM

## 2014-03-30 DIAGNOSIS — Q619 Cystic kidney disease, unspecified: Secondary | ICD-10-CM | POA: Insufficient documentation

## 2014-03-30 LAB — POCT I-STAT CREATININE: Creatinine, Ser: 2.4 mg/dL — ABNORMAL HIGH (ref 0.50–1.35)

## 2014-03-30 MED ORDER — GADOBENATE DIMEGLUMINE 529 MG/ML IV SOLN
10.0000 mL | Freq: Once | INTRAVENOUS | Status: AC | PRN
Start: 2014-03-30 — End: 2014-03-30
  Administered 2014-03-30: 8 mL via INTRAVENOUS

## 2014-04-07 ENCOUNTER — Other Ambulatory Visit: Payer: Self-pay

## 2014-04-07 MED ORDER — MEMANTINE HCL ER 28 MG PO CP24: 28.0000 mg | ORAL_CAPSULE | Freq: Every day | ORAL | Status: DC

## 2014-04-07 NOTE — Telephone Encounter (Signed)
Rx signed and faxed.

## 2014-04-07 NOTE — Telephone Encounter (Signed)
Written Rx needed for Patient Assistance.

## 2014-04-15 ENCOUNTER — Other Ambulatory Visit: Payer: Self-pay | Admitting: Neurology

## 2014-04-15 ENCOUNTER — Telehealth: Payer: Self-pay | Admitting: Neurology

## 2014-04-15 NOTE — Telephone Encounter (Signed)
Would like the nurse to give a call about the namenda whether or not he was approved for patient assistance

## 2014-04-16 ENCOUNTER — Emergency Department (HOSPITAL_COMMUNITY)
Admission: EM | Admit: 2014-04-16 | Discharge: 2014-04-16 | Disposition: A | Discharge status: Home / Self Care | Payer: Medicare HMO | Attending: Emergency Medicine | Admitting: Emergency Medicine

## 2014-04-16 ENCOUNTER — Emergency Department (HOSPITAL_COMMUNITY): Payer: Medicare HMO

## 2014-04-16 ENCOUNTER — Encounter (HOSPITAL_COMMUNITY): Payer: Self-pay | Admitting: Emergency Medicine

## 2014-04-16 DIAGNOSIS — IMO0002 Reserved for concepts with insufficient information to code with codable children: Secondary | ICD-10-CM | POA: Diagnosis not present

## 2014-04-16 DIAGNOSIS — I2510 Atherosclerotic heart disease of native coronary artery without angina pectoris: Secondary | ICD-10-CM | POA: Diagnosis not present

## 2014-04-16 DIAGNOSIS — F0280 Dementia in other diseases classified elsewhere without behavioral disturbance: Secondary | ICD-10-CM | POA: Diagnosis not present

## 2014-04-16 DIAGNOSIS — R079 Chest pain, unspecified: Secondary | ICD-10-CM | POA: Insufficient documentation

## 2014-04-16 DIAGNOSIS — Z9889 Other specified postprocedural states: Secondary | ICD-10-CM | POA: Insufficient documentation

## 2014-04-16 DIAGNOSIS — Z79899 Other long term (current) drug therapy: Secondary | ICD-10-CM | POA: Diagnosis not present

## 2014-04-16 DIAGNOSIS — Z862 Personal history of diseases of the blood and blood-forming organs and certain disorders involving the immune mechanism: Secondary | ICD-10-CM | POA: Insufficient documentation

## 2014-04-16 DIAGNOSIS — J4489 Other specified chronic obstructive pulmonary disease: Secondary | ICD-10-CM | POA: Insufficient documentation

## 2014-04-16 DIAGNOSIS — G309 Alzheimer's disease, unspecified: Secondary | ICD-10-CM | POA: Insufficient documentation

## 2014-04-16 DIAGNOSIS — Z8639 Personal history of other endocrine, nutritional and metabolic disease: Secondary | ICD-10-CM | POA: Insufficient documentation

## 2014-04-16 DIAGNOSIS — K219 Gastro-esophageal reflux disease without esophagitis: Secondary | ICD-10-CM | POA: Diagnosis not present

## 2014-04-16 DIAGNOSIS — I10 Essential (primary) hypertension: Secondary | ICD-10-CM | POA: Diagnosis not present

## 2014-04-16 DIAGNOSIS — Z7982 Long term (current) use of aspirin: Secondary | ICD-10-CM | POA: Insufficient documentation

## 2014-04-16 DIAGNOSIS — Z951 Presence of aortocoronary bypass graft: Secondary | ICD-10-CM | POA: Insufficient documentation

## 2014-04-16 DIAGNOSIS — R0789 Other chest pain: Secondary | ICD-10-CM

## 2014-04-16 DIAGNOSIS — N400 Enlarged prostate without lower urinary tract symptoms: Secondary | ICD-10-CM | POA: Insufficient documentation

## 2014-04-16 DIAGNOSIS — Z87891 Personal history of nicotine dependence: Secondary | ICD-10-CM | POA: Diagnosis not present

## 2014-04-16 DIAGNOSIS — R071 Chest pain on breathing: Secondary | ICD-10-CM | POA: Insufficient documentation

## 2014-04-16 DIAGNOSIS — J449 Chronic obstructive pulmonary disease, unspecified: Secondary | ICD-10-CM | POA: Diagnosis not present

## 2014-04-16 LAB — URINALYSIS, ROUTINE W REFLEX MICROSCOPIC
Bilirubin Urine: NEGATIVE
Glucose, UA: NEGATIVE mg/dL
Hgb urine dipstick: NEGATIVE
KETONES UR: NEGATIVE mg/dL
NITRITE: NEGATIVE
PH: 5.5 (ref 5.0–8.0)
Protein, ur: NEGATIVE mg/dL
Specific Gravity, Urine: <1.005 — ABNORMAL LOW (ref 1.005–1.030)
Urobilinogen, UA: 0.2 mg/dL (ref 0.0–1.0)

## 2014-04-16 LAB — COMPREHENSIVE METABOLIC PANEL
ALBUMIN: 3.7 g/dL (ref 3.5–5.2)
ALT: 16 U/L (ref 0–53)
AST: 27 U/L (ref 0–37)
Alkaline Phosphatase: 59 U/L (ref 39–117)
Anion gap: 13 (ref 5–15)
BUN: 22 mg/dL (ref 6–23)
CALCIUM: 9.5 mg/dL (ref 8.4–10.5)
CO2: 25 mEq/L (ref 19–32)
Chloride: 104 mEq/L (ref 96–112)
Creatinine, Ser: 2.31 mg/dL — ABNORMAL HIGH (ref 0.50–1.35)
GFR calc non Af Amer: 25 mL/min — ABNORMAL LOW (ref >90)
GFR, EST AFRICAN AMERICAN: 29 mL/min — AB (ref >90)
GLUCOSE: 99 mg/dL (ref 70–99)
POTASSIUM: 4.4 meq/L (ref 3.7–5.3)
SODIUM: 142 meq/L (ref 137–147)
TOTAL PROTEIN: 8.5 g/dL — AB (ref 6.0–8.3)
Total Bilirubin: 0.2 mg/dL — ABNORMAL LOW (ref 0.3–1.2)

## 2014-04-16 LAB — CBC WITH DIFFERENTIAL/PLATELET
BASOS ABS: 0 10*3/uL (ref 0.0–0.1)
BASOS PCT: 0 % (ref 0–1)
EOS ABS: 0.1 10*3/uL (ref 0.0–0.7)
EOS PCT: 2 % (ref 0–5)
HCT: 36.6 % — ABNORMAL LOW (ref 39.0–52.0)
Hemoglobin: 12.6 g/dL — ABNORMAL LOW (ref 13.0–17.0)
LYMPHS ABS: 2.1 10*3/uL (ref 0.7–4.0)
Lymphocytes Relative: 34 % (ref 12–46)
MCH: 33.9 pg (ref 26.0–34.0)
MCHC: 34.4 g/dL (ref 30.0–36.0)
MCV: 98.4 fL (ref 78.0–100.0)
Monocytes Absolute: 0.5 10*3/uL (ref 0.1–1.0)
Monocytes Relative: 8 % (ref 3–12)
NEUTROS PCT: 56 % (ref 43–77)
Neutro Abs: 3.4 10*3/uL (ref 1.7–7.7)
Platelets: 172 10*3/uL (ref 150–400)
RBC: 3.72 MIL/uL — ABNORMAL LOW (ref 4.22–5.81)
RDW: 14.5 % (ref 11.5–15.5)
WBC: 6.1 10*3/uL (ref 4.0–10.5)

## 2014-04-16 LAB — URINE MICROSCOPIC-ADD ON

## 2014-04-16 LAB — PRO B NATRIURETIC PEPTIDE: PRO B NATRI PEPTIDE: 152.4 pg/mL (ref 0–450)

## 2014-04-16 LAB — TROPONIN I: Troponin I: <0.3 ng/mL (ref <0.30)

## 2014-04-16 MED ORDER — HYDROCODONE-ACETAMINOPHEN 5-325 MG PO TABS: 0.5000 | ORAL_TABLET | ORAL | Status: DC | PRN

## 2014-04-16 NOTE — ED Provider Notes (Signed)
CSN: [id number]     Arrival date & time 04/16/14  1110 History   First MD Initiated Contact with Patient 04/16/14 1113    This chart was scribed for Orpah Greek, * by Terressa Koyanagi, ED Scribe. This patient was seen in room APA02/APA02 and the patient's care was started at 11:14 AM.  No chief complaint on file.  The history is provided by the patient. No language interpreter was used.   PCP: Gara Kroner, MD HPI Comments: [name] [name] is a 80 y.o. male, with medical Hx noted below and significant for dementia, HTN, CAD, who presents to the Emergency Department complaining of intermittent chest pain onset 2 weeks ago. Pt denies SOB. Pt presented to the ED with similar Sx in mid July.   Past Medical History  Diagnosis Date  . Renal insufficiency   . PVD (peripheral vascular disease)   . Hypertension   . Hyperlipemia   . GERD (gastroesophageal reflux disease)   . CAD (coronary artery disease)   . COPD (chronic obstructive pulmonary disease)   . Monoclonal gammopathy   . BPH (benign prostatic hyperplasia)   . DEMENTIA   . Alzheimer disease   . Memory loss    Past Surgical History  Procedure Laterality Date  . Coronary artery bypass graft  1997  . Left cea  2002  . Back surgery  2011  . Cataract extraction w/phaco  09/18/2011    Procedure: CATARACT EXTRACTION PHACO AND INTRAOCULAR LENS PLACEMENT (IOC);  Surgeon: Elta Guadeloupe T. Gershon Crane, MD;  Location: [facility];  Service: Ophthalmology;  Laterality: Right;  CDE: 6.19  . Cardiac surgery     Family History  Problem Relation Age of Onset  . Anesthesia problems Neg Hx   . Hypotension Neg Hx   . Malignant hyperthermia Neg Hx   . Pseudochol deficiency Neg Hx   . High blood pressure    . High Cholesterol     History  Substance Use Topics  . Smoking status: Former Smoker -- 0.50 packs/day for 40 years    Types: Cigarettes    Quit date: 07/23/1978  . Smokeless tobacco: Never Used  . Alcohol Use: No    Review of Systems   Respiratory: Negative for shortness of breath.   Cardiovascular: Positive for chest pain.  Psychiatric/Behavioral: Negative for confusion.  All other systems reviewed and are negative.  Allergies  Review of patient's allergies indicates no known allergies.  Home Medications   Prior to Admission medications   Medication Sig Start Date End Date Taking? Authorizing Provider  acetaminophen (TYLENOL) 500 MG tablet Take 1,000 mg by mouth every 6 (six) hours as needed for headache.    Historical Provider, MD  albuterol (PROVENTIL HFA;VENTOLIN HFA) 108 (90 BASE) MCG/ACT inhaler Inhale 2 puffs into the lungs every 6 (six) hours as needed for wheezing. 08/12/12   Elsie Stain, MD  albuterol (PROVENTIL) (2.5 MG/3ML) 0.083% nebulizer solution Take 1 vial (72mL) 3-4 times per day. 12/28/13   Elsie Stain, MD  ALPRAZolam Duanne Moron) 0.5 MG tablet Take 1 tablet (0.5 mg total) by mouth daily. If needed 12/24/13   Marcial Pacas, MD  arformoterol (BROVANA) 15 MCG/2ML NEBU Take 2 mLs (15 mcg total) by nebulization 2 (two) times daily. 11/27/13   Elsie Stain, MD  aspirin 325 MG tablet Take 325 mg by mouth daily.      Historical Provider, MD  calcitRIOL (ROCALTROL) 0.25 MCG capsule Take 0.25 mcg by mouth daily.  11/12/12   Historical  Provider, MD  cholecalciferol (VITAMIN D) 1000 UNITS tablet Take 1,000 Units by mouth daily.    Historical Provider, MD  donepezil (ARICEPT) 10 MG tablet Take 1 tablet (10 mg total) by mouth at bedtime. 09/02/13   Marcial Pacas, MD  finasteride (PROSCAR) 5 MG tablet Take 5 mg by mouth daily.     Historical Provider, MD  fluticasone (FLONASE) 50 MCG/ACT nasal spray Place 1 spray into both nostrils daily.  08/18/13   Historical Provider, MD  gabapentin (NEURONTIN) 300 MG capsule Take 1 capsule (300 mg total) by mouth 3 (three) times daily. 01/25/14   Marcial Pacas, MD  latanoprost (XALATAN) 0.005 % ophthalmic solution  12/28/13   Historical Provider, MD  losartan (COZAAR) 50 MG tablet Take 1 tablet  (50 mg total) by mouth daily. 10/24/11   Elsie Stain, MD  lubiprostone (AMITIZA) 24 MCG capsule Take 24 mcg by mouth every other day. Taken only as needed    Historical Provider, MD  Memantine HCl ER (NAMENDA XR) 28 MG CP24 Take 28 mg by mouth daily. 04/07/14   Marcial Pacas, MD  mirtazapine (REMERON) 15 MG tablet Take 30 mg by mouth at bedtime.     Historical Provider, MD  Multiple Vitamin (MULTIVITAMIN WITH MINERALS) TABS tablet Take 1 tablet by mouth daily.    Historical Provider, MD  Nebulizers (COMPRESSOR/NEBULIZER) MISC Use with albuterol and brovana 11/27/13   Elsie Stain, MD  nitroGLYCERIN (NITROSTAT) 0.4 MG SL tablet Place 0.4 mg under the tongue every 5 (five) minutes as needed. Chest pain    Historical Provider, MD  nortriptyline (PAMELOR) 10 MG capsule One po qhs xone week, then 2 tabs po qhs 12/22/13   Marcial Pacas, MD  omeprazole (PRILOSEC) 40 MG capsule Take 40 mg by mouth 2 (two) times daily.  09/07/13   Historical Provider, MD  predniSONE (DELTASONE) 10 MG tablet Take  4 each am x 2 days,   2 each am x 2 days,  1 each am x 2 days and stop 01/27/14   Tanda Rockers, MD  promethazine (PHENERGAN) 25 MG tablet 1-2 tablets by mouth every 6 hours as needed for nausea    Historical Provider, MD  sodium bicarbonate 325 MG tablet Take 325 mg by mouth 2 (two) times daily.      Historical Provider, MD  Tamsulosin HCl (FLOMAX) 0.4 MG CAPS Take 0.4 mg by mouth at bedtime.     Historical Provider, MD  TRAVATAN Z 0.004 % SOLN ophthalmic solution Place 1 drop into both eyes At bedtime.    Historical Provider, MD   Triage Vitals: BP 156/75  Pulse 81  Temp(Src) 98 F (36.7 C) (Oral)  Resp 16  Ht 5\' 7"  (1.702 m)  Wt 169 lb (76.658 kg)  BMI 26.46 kg/m2  SpO2 100% Physical Exam  Constitutional: He is oriented to person, place, and time. He appears well-developed and well-nourished. No distress.  HENT:  Head: Normocephalic and atraumatic.  Right Ear: Hearing normal.  Left Ear: Hearing normal.   Nose: Nose normal.  Mouth/Throat: Oropharynx is clear and moist and mucous membranes are normal.  Eyes: Conjunctivae and EOM are normal. Pupils are equal, round, and reactive to light.  Neck: Normal range of motion. Neck supple.  Cardiovascular: Normal rate, regular rhythm, S1 normal and S2 normal.  Exam reveals no gallop and no friction rub.   No murmur heard. Pulmonary/Chest: Effort normal and breath sounds normal. No respiratory distress. He exhibits tenderness (exquisitely tender to palpation).  Abdominal: Soft. Normal appearance and bowel sounds are normal. There is no hepatosplenomegaly. There is no tenderness. There is no rebound, no guarding, no tenderness at McBurney's point and negative Murphy's sign. No hernia.  Musculoskeletal: Normal range of motion.  Neurological: He is alert and oriented to person, place, and time. He has normal strength. No cranial nerve deficit or sensory deficit. Coordination normal. GCS eye subscore is 4. GCS verbal subscore is 5. GCS motor subscore is 6.  Skin: Skin is warm, dry and intact. No rash noted. No cyanosis.  Psychiatric: He has a normal mood and affect. His speech is normal and behavior is normal. Thought content normal.    ED Course  Procedures (including critical care time) DIAGNOSTIC STUDIES: Oxygen Saturation is 100% on RA, nl by my interpretation.    COORDINATION OF CARE: 11:18 AM-Discussed treatment plan which includes meds, labs and imaging with pt at bedside and pt agreed to plan.   Labs Review Labs Reviewed - No data to display  Imaging Review No results found.   EKG Interpretation None      MDM   Final diagnoses:  None   chest wall pain  Patient presents to the ER for evaluation of chest pain. This has been ongoing for several weeks. He reports 2 weeks, but there was a recent visit where he also isn't a similar pain and had been present for 2 months at that time. Patient therefore likely has some element of chronic  chest pain. His examination today is most consistent with musculoskeletal chest pain. This is exquisitely tender over the area where he is hurting. He does have a history of COPD and coronary artery disease. Lungs are clear today. Chest x-ray was unremarkable. EKG did not show any acute changes. Troponin x2 normal, patient appropriate and safe for outpatient management of his pain.  I personally performed the services described in this documentation, which was scribed in my presence. The recorded information has been reviewed and is accurate.      Orpah Greek, MD 04/16/14 [phone]

## 2014-04-16 NOTE — ED Notes (Signed)
PT c/o left sided chest pain upon palpation x2 weeks that worsened last night and c/o indigestion worsening x1 day with decreased appetite.

## 2014-04-16 NOTE — Discharge Instructions (Signed)
Chest Wall Pain °Chest wall pain is pain felt in or around the chest bones and muscles. It may take up to 6 weeks to get better. It may take longer if you are active. Chest wall pain can happen on its own. Other times, things like germs, injury, coughing, or exercise can cause the pain. °HOME CARE  °· Avoid activities that make you tired or cause pain. Try not to use your chest, belly (abdominal), or side muscles. Do not use heavy weights. °· Put ice on the sore area. °¨ Put ice in a plastic bag. °¨ Place a towel between your skin and the bag. °¨ Leave the ice on for 15-20 minutes for the first 2 days. °· Only take medicine as told by your doctor. °GET HELP RIGHT AWAY IF:  °· You have more pain or are very uncomfortable. °· You have a fever. °· Your chest pain gets worse. °· You have new problems. °· You feel sick to your stomach (nauseous) or throw up (vomit). °· You start to sweat or feel lightheaded. °· You have a cough with mucus (phlegm). °· You cough up blood. °MAKE SURE YOU:  °· Understand these instructions. °· Will watch your condition. °· Will get help right away if you are not doing well or get worse. °Document Released: 12/26/2007 Document Revised: 10/01/2011 Document Reviewed: 03/05/2011 °Exit[employer]® Patient Information ©2015 [employer]. This information is not intended to replace advice given to you by your health care provider. Make sure you discuss any questions you have with your health care provider. ° °

## 2014-04-20 NOTE — Telephone Encounter (Signed)
Spouse calling back regarding status of [employer] program.  Please call and advise

## 2014-04-20 NOTE — Telephone Encounter (Signed)
I have been out of the office since 09/21.  Message was forwarded to me today.    The Manufacturer determines eligibility, and they contact the patient once a decision has been made.  I called back.  Relayed this info.  They are aware and will call us back if anything further is needed.  Says he still has plenty of medication on hand at this time.

## 2014-04-20 NOTE — Telephone Encounter (Signed)
Please advise. Thanks.  

## 2014-04-26 ENCOUNTER — Telehealth: Payer: Self-pay | Admitting: Neurology

## 2014-04-26 NOTE — Telephone Encounter (Signed)
Called pt and spoke with pt's wife Peter Congo informing her that the pt's medication was ready to be picked up at the front desk and if the pt has any other problems, questions or concerns to call the office. Wife verbalized understanding.

## 2014-07-12 ENCOUNTER — Other Ambulatory Visit: Payer: Self-pay

## 2014-07-13 MED ORDER — MEMANTINE HCL ER 28 MG PO CP24: ORAL_CAPSULE | ORAL | Status: DC

## 2014-07-13 NOTE — Telephone Encounter (Signed)
Rx signed and faxed.

## 2014-07-19 ENCOUNTER — Telehealth: Payer: Self-pay | Admitting: Neurology

## 2014-07-19 NOTE — Telephone Encounter (Signed)
I called the program.  The patient's enrollment is still pending, however, the current approval is valid until 07/22/2014, so they will go ahead and send a 90 day Rx.  Ref # [id number].  Meds will arrive in 7-10 days.  I called the patient back, got no answer.  Left message.

## 2014-07-19 NOTE — Telephone Encounter (Signed)
Pt states she filled out application for his Namenda and she wants to know the status of it.  Pt only has a 11 pills left. Please call and advise.

## 2014-07-21 ENCOUNTER — Other Ambulatory Visit: Payer: Self-pay | Admitting: Neurology

## 2014-07-21 MED ORDER — ALPRAZOLAM 0.5 MG PO TABS: 0.5000 mg | ORAL_TABLET | Freq: Every day | ORAL | Status: DC

## 2014-07-21 NOTE — Telephone Encounter (Signed)
Rx signed and faxed.

## 2014-07-21 NOTE — Telephone Encounter (Signed)
Request entered, forwarded to provider for approval.  

## 2014-07-21 NOTE — Telephone Encounter (Signed)
Patient's spouse requesting Rx refill for ALPRAZolam Duanne Moron) 0.5 MG tablet sent to [facility].  Please call and advise.

## 2014-07-22 NOTE — Telephone Encounter (Signed)
PAP for Namenda XR arrived 07/22/14 and I placed it up front for pickup.  I called the patient and had to leave a message, letting him know PAP is ready for pickup.

## 2014-07-29 ENCOUNTER — Encounter (HOSPITAL_COMMUNITY): Payer: Self-pay | Admitting: Emergency Medicine

## 2014-07-29 ENCOUNTER — Emergency Department (HOSPITAL_COMMUNITY)
Admission: EM | Admit: 2014-07-29 | Discharge: 2014-07-29 | Disposition: A | Discharge status: Home / Self Care | Payer: Commercial Managed Care - HMO | Attending: Emergency Medicine | Admitting: Emergency Medicine

## 2014-07-29 DIAGNOSIS — E785 Hyperlipidemia, unspecified: Secondary | ICD-10-CM | POA: Insufficient documentation

## 2014-07-29 DIAGNOSIS — K219 Gastro-esophageal reflux disease without esophagitis: Secondary | ICD-10-CM | POA: Diagnosis not present

## 2014-07-29 DIAGNOSIS — Z7982 Long term (current) use of aspirin: Secondary | ICD-10-CM | POA: Diagnosis not present

## 2014-07-29 DIAGNOSIS — G309 Alzheimer's disease, unspecified: Secondary | ICD-10-CM | POA: Diagnosis not present

## 2014-07-29 DIAGNOSIS — Z79899 Other long term (current) drug therapy: Secondary | ICD-10-CM | POA: Insufficient documentation

## 2014-07-29 DIAGNOSIS — I2510 Atherosclerotic heart disease of native coronary artery without angina pectoris: Secondary | ICD-10-CM | POA: Diagnosis not present

## 2014-07-29 DIAGNOSIS — K5900 Constipation, unspecified: Secondary | ICD-10-CM

## 2014-07-29 DIAGNOSIS — Z862 Personal history of diseases of the blood and blood-forming organs and certain disorders involving the immune mechanism: Secondary | ICD-10-CM | POA: Diagnosis not present

## 2014-07-29 DIAGNOSIS — I10 Essential (primary) hypertension: Secondary | ICD-10-CM | POA: Diagnosis not present

## 2014-07-29 DIAGNOSIS — Z87891 Personal history of nicotine dependence: Secondary | ICD-10-CM | POA: Diagnosis not present

## 2014-07-29 DIAGNOSIS — J449 Chronic obstructive pulmonary disease, unspecified: Secondary | ICD-10-CM | POA: Insufficient documentation

## 2014-07-29 DIAGNOSIS — F0280 Dementia in other diseases classified elsewhere without behavioral disturbance: Secondary | ICD-10-CM | POA: Diagnosis not present

## 2014-07-29 DIAGNOSIS — Z87448 Personal history of other diseases of urinary system: Secondary | ICD-10-CM | POA: Diagnosis not present

## 2014-07-29 MED ORDER — FLEET ENEMA 7-19 GM/118ML RE ENEM
1.0000 | ENEMA | Freq: Once | RECTAL | Status: AC
  Administered 2014-07-29: 1 via RECTAL

## 2014-07-29 MED ORDER — MINERAL OIL RE ENEM
1.0000 | ENEMA | Freq: Once | RECTAL | Status: AC
  Administered 2014-07-29: 1 via RECTAL

## 2014-07-29 NOTE — ED Notes (Signed)
Pt c/o constipation x 1 week. States he had a small, hard bm x 3 days ago. Reports taking po well. Denies n/v.

## 2014-07-29 NOTE — ED Notes (Signed)
Nurse spoke with Dr. Leonides Schanz and advised for nurse to do digital dis-impaction. Small amount of stool was dis-impacted. Pt is going to try to make BM again.

## 2014-07-29 NOTE — ED Notes (Signed)
Pt not able to product BM with x1 fleet enema. MD made aware.

## 2014-07-29 NOTE — Discharge Instructions (Signed)
I recommended she use MiraLAX once a day, Metamucil once a day, Colace 100 mg tablets twice a day and Fleet enemas once a day for constipation. Please continue to drink plenty of water and eat foods high in fiber.   Constipation Constipation is when a person has fewer than three bowel movements a week, has difficulty having a bowel movement, or has stools that are dry, hard, or larger than normal. As people grow older, constipation is more common. If you try to fix constipation with medicines that make you have a bowel movement (laxatives), the problem may get worse. Long-term laxative use may cause the muscles of the colon to become weak. A low-fiber diet, not taking in enough fluids, and taking certain medicines may make constipation worse.  CAUSES   Certain medicines, such as antidepressants, pain medicine, iron supplements, antacids, and water pills.   Certain diseases, such as diabetes, irritable bowel syndrome (IBS), thyroid disease, or depression.   Not drinking enough water.   Not eating enough fiber-rich foods.   Stress or travel.   Lack of physical activity or exercise.   Ignoring the urge to have a bowel movement.   Using laxatives too much.  SIGNS AND SYMPTOMS   Having fewer than three bowel movements a week.   Straining to have a bowel movement.   Having stools that are hard, dry, or larger than normal.   Feeling full or bloated.   Pain in the lower abdomen.   Not feeling relief after having a bowel movement.  DIAGNOSIS  Your health care provider will take a medical history and perform a physical exam. Further testing may be done for severe constipation. Some tests may include:  A barium enema X-ray to examine your rectum, colon, and, sometimes, your small intestine.   A sigmoidoscopy to examine your lower colon.   A colonoscopy to examine your entire colon. TREATMENT  Treatment will depend on the severity of your constipation and what is  causing it. Some dietary treatments include drinking more fluids and eating more fiber-rich foods. Lifestyle treatments may include regular exercise. If these diet and lifestyle recommendations do not help, your health care provider may recommend taking over-the-counter laxative medicines to help you have bowel movements. Prescription medicines may be prescribed if over-the-counter medicines do not work.  HOME CARE INSTRUCTIONS   Eat foods that have a lot of fiber, such as fruits, vegetables, whole grains, and beans.  Limit foods high in fat and processed sugars, such as french fries, hamburgers, cookies, candies, and soda.   A fiber supplement may be added to your diet if you cannot get enough fiber from foods.   Drink enough fluids to keep your urine clear or pale yellow.   Exercise regularly or as directed by your health care provider.   Go to the restroom when you have the urge to go. Do not hold it.   Only take over-the-counter or prescription medicines as directed by your health care provider. Do not take other medicines for constipation without talking to your health care provider first.  [place] IF:   You have bright red blood in your stool.   Your constipation lasts for more than 4 days or gets worse.   You have abdominal or rectal pain.   You have thin, pencil-like stools.   You have unexplained weight loss. MAKE SURE YOU:   Understand these instructions.  Will watch your condition.  Will get help right away if you are not  doing well or get worse. Document Released: 04/06/2004 Document Revised: 07/14/2013 Document Reviewed: 04/20/2013 [facility] Patient Information 2015 [place], [place]. This information is not intended to replace advice given to you by your health care provider. Make sure you discuss any questions you have with your health care provider.   High-Fiber Diet Fiber is found in fruits, vegetables, and grains. A high-fiber diet  encourages the addition of more whole grains, legumes, fruits, and vegetables in your diet. The recommended amount of fiber for adult males is 38 g per day. For adult females, it is 25 g per day. Pregnant and lactating women should get 28 g of fiber per day. If you have a digestive or bowel problem, ask your caregiver for advice before adding high-fiber foods to your diet. Eat a variety of high-fiber foods instead of only a select few type of foods.  PURPOSE  To increase stool bulk.  To make bowel movements more regular to prevent constipation.  To lower cholesterol.  To prevent overeating. WHEN IS THIS DIET USED?  It may be used if you have constipation and hemorrhoids.  It may be used if you have uncomplicated diverticulosis (intestine condition) and irritable bowel syndrome.  It may be used if you need help with weight management.  It may be used if you want to add it to your diet as a protective measure against atherosclerosis, diabetes, and cancer. SOURCES OF FIBER  Whole-grain breads and cereals.  Fruits, such as apples, oranges, bananas, berries, prunes, and pears.  Vegetables, such as green peas, carrots, sweet potatoes, beets, broccoli, cabbage, spinach, and artichokes.  Legumes, such split peas, soy, lentils.  Almonds. FIBER CONTENT IN FOODS Starches and Grains / Dietary Fiber (g)  Cheerios, 1 cup / 3 g  Corn Flakes cereal, 1 cup / 0.7 g  Rice crispy treat cereal, 1 cup / 0.3 g  Instant oatmeal (cooked),  cup / 2 g  Frosted wheat cereal, 1 cup / 5.1 g  Brown, long-grain rice (cooked), 1 cup / 3.5 g  White, long-grain rice (cooked), 1 cup / 0.6 g  Enriched macaroni (cooked), 1 cup / 2.5 g Legumes / Dietary Fiber (g)  Baked beans (canned, plain, or vegetarian),  cup / 5.2 g  Kidney beans (canned),  cup / 6.8 g  Pinto beans (cooked),  cup / 5.5 g Breads and Crackers / Dietary Fiber (g)  Plain or honey graham crackers, 2 squares / 0.7 g  Saltine  crackers, 3 squares / 0.3 g  Plain, salted pretzels, 10 pieces / 1.8 g  Whole-wheat bread, 1 slice / 1.9 g  White bread, 1 slice / 0.7 g  Raisin bread, 1 slice / 1.2 g  Plain bagel, 3 oz / 2 g  Flour tortilla, 1 oz / 0.9 g  Corn tortilla, 1 small / 1.5 g  Hamburger or hotdog bun, 1 small / 0.9 g Fruits / Dietary Fiber (g)  Apple with skin, 1 medium / 4.4 g  Sweetened applesauce,  cup / 1.5 g  Banana,  medium / 1.5 g  Grapes, 10 grapes / 0.4 g  Orange, 1 small / 2.3 g  Raisin, 1.5 oz / 1.6 g  Melon, 1 cup / 1.4 g Vegetables / Dietary Fiber (g)  Green beans (canned),  cup / 1.3 g  Carrots (cooked),  cup / 2.3 g  Broccoli (cooked),  cup / 2.8 g  Peas (cooked),  cup / 4.4 g  Mashed potatoes,  cup / 1.6 g  Lettuce, 1 cup / 0.5 g  Corn (canned),  cup / 1.6 g  Tomato,  cup / 1.1 g Document Released: 07/09/2005 Document Revised: 01/08/2012 Document Reviewed: 10/11/2011 [facility] Patient Information 2015 [place], [place]. This information is not intended to replace advice given to you by your health care provider. Make sure you discuss any questions you have with your health care provider.

## 2014-07-29 NOTE — ED Provider Notes (Signed)
This chart was scribed for [place], DO by Edison Simon, ED Scribe. This patient was seen in room APA07/APA07  TIME SEEN: 0825  CHIEF COMPLAINT: constipation  HPI: [name] [name] is a 81 y.o. male who presents to the Emergency Department complaining of constipation with onset 4-5 days ago. He states his last bowel movement was 2 days ago. He is not sure if he is passing gas.. He has associated decreased appetite. Wife states he has used Dulcolax, milk of magnesia, and suppositories. Wife states he does not use any of these every day. Wife states he does not use enemas at home. He denies prior abdominal surgeries. Wife is injured he is still taking narcotics. Denies nausea, fever, vomiting.  ROS: See HPI Constitutional: no fever, positive decreased appetite Eyes: no drainage  ENT: no runny nose   Cardiovascular:  no chest pain  Resp: no SOB  GI: no nausea, vomiting, positive constipation GU: no dysuria Integumentary: no rash  Allergy: no hives  Musculoskeletal: no leg swelling  Neurological: no slurred speech ROS otherwise negative  PAST MEDICAL HISTORY/PAST SURGICAL HISTORY:  Past Medical History  Diagnosis Date  . Renal insufficiency   . PVD (peripheral vascular disease)   . Hypertension   . Hyperlipemia   . GERD (gastroesophageal reflux disease)   . CAD (coronary artery disease)   . COPD (chronic obstructive pulmonary disease)   . Monoclonal gammopathy   . BPH (benign prostatic hyperplasia)   . DEMENTIA   . Alzheimer disease   . Memory loss     MEDICATIONS:  Prior to Admission medications   Medication Sig Start Date End Date Taking? Authorizing Provider  acetaminophen (TYLENOL) 500 MG tablet Take 1,000 mg by mouth every 6 (six) hours as needed for headache.    Historical Provider, MD  albuterol (PROVENTIL) (2.5 MG/3ML) 0.083% nebulizer solution Take 1 vial (3mL) 3-4 times per day. 12/28/13   Elsie Stain, MD  ALPRAZolam Duanne Moron) 0.5 MG tablet Take 1 tablet (0.5 mg  total) by mouth daily. If needed 07/21/14   Marcial Pacas, MD  arformoterol (BROVANA) 15 MCG/2ML NEBU Take 2 mLs (15 mcg total) by nebulization 2 (two) times daily. 11/27/13   Elsie Stain, MD  aspirin 325 MG tablet Take 325 mg by mouth daily.      Historical Provider, MD  calcitRIOL (ROCALTROL) 0.25 MCG capsule Take 0.25 mcg by mouth daily.  11/12/12   Historical Provider, MD  cetirizine (ZYRTEC) 10 MG tablet Take 10 mg by mouth daily.    Historical Provider, MD  cholecalciferol (VITAMIN D) 1000 UNITS tablet Take 1,000 Units by mouth daily.    Historical Provider, MD  donepezil (ARICEPT) 10 MG tablet Take 1 tablet (10 mg total) by mouth at bedtime. 09/02/13   Marcial Pacas, MD  finasteride (PROSCAR) 5 MG tablet Take 5 mg by mouth daily.     Historical Provider, MD  gabapentin (NEURONTIN) 300 MG capsule Take 1 capsule (300 mg total) by mouth 3 (three) times daily. 01/25/14   Marcial Pacas, MD  HYDROcodone-acetaminophen (NORCO/VICODIN) 5-325 MG per tablet Take 0.5 tablets by mouth every 4 (four) hours as needed for moderate pain. 04/16/14   Orpah Greek, MD  latanoprost (XALATAN) 0.005 % ophthalmic solution Place 1 drop into both eyes at bedtime.  12/28/13   Historical Provider, MD  levofloxacin (LEVAQUIN) 500 MG tablet Take 1 tablet by mouth daily. Starting 04/06/2014 x 10 days. 04/06/14   Historical Provider, MD  losartan (COZAAR) 50 MG  tablet Take 1 tablet (50 mg total) by mouth daily. 10/24/11   Elsie Stain, MD  Memantine HCl ER (NAMENDA XR) 28 MG CP24 TAKE ONE CAPSULE BY MOUTH ONCE DAILY. 07/13/14   Marcial Pacas, MD  mirtazapine (REMERON) 15 MG tablet Take 30 mg by mouth at bedtime.     Historical Provider, MD  Multiple Vitamin (MULTIVITAMIN WITH MINERALS) TABS tablet Take 1 tablet by mouth daily.    Historical Provider, MD  nitroGLYCERIN (NITROSTAT) 0.4 MG SL tablet Place 0.4 mg under the tongue every 5 (five) minutes as needed. Chest pain    Historical Provider, MD  nortriptyline (PAMELOR) 10 MG  capsule Take 20 mg by mouth at bedtime.    Historical Provider, MD  omeprazole (PRILOSEC) 40 MG capsule Take 40 mg by mouth 2 (two) times daily.  09/07/13   Historical Provider, MD  Phenylephrine-APAP-Guaifenesin ([place] FAST-MAX COLD & SINUS) 10-650-400 MG/20ML LIQD Take 30 mLs by mouth every 6 (six) hours as needed (congestion).    Historical Provider, MD  promethazine (PHENERGAN) 25 MG tablet 1-2 tablets by mouth every 6 hours as needed for nausea    Historical Provider, MD  sodium bicarbonate 325 MG tablet Take 325 mg by mouth 2 (two) times daily.      Historical Provider, MD  Tamsulosin HCl (FLOMAX) 0.4 MG CAPS Take 0.4 mg by mouth at bedtime.     Historical Provider, MD    ALLERGIES:  No Known Allergies  SOCIAL HISTORY:  History  Substance Use Topics  . Smoking status: Former Smoker -- 0.50 packs/day for 40 years    Types: Cigarettes    Quit date: 07/23/1978  . Smokeless tobacco: Never Used  . Alcohol Use: No    FAMILY HISTORY: Family History  Problem Relation Age of Onset  . Anesthesia problems Neg Hx   . Hypotension Neg Hx   . Malignant hyperthermia Neg Hx   . Pseudochol deficiency Neg Hx   . High blood pressure    . High Cholesterol      EXAM: There were no vitals taken for this visit. CONSTITUTIONAL: Alert and oriented and responds appropriately to questions. Well-appearing; well-nourished HEAD: Normocephalic EYES: Conjunctivae clear, PERRL ENT: normal nose; no rhinorrhea; moist mucous membranes; pharynx without lesions noted NECK: Supple, no meningismus, no LAD  CARD: RRR; S1 and S2 appreciated; no murmurs, no clicks, no rubs, no gallops RESP: Normal chest excursion without splinting or tachypnea; breath sounds clear and equal bilaterally; no wheezes, no rhonchi, no rales,  ABD/GI: Normal bowel sounds; non-distended; soft, non-tender, no rebound, no guarding; impacted stool in rectal vault; no distension, tympany or fluid wave BACK:  The back appears normal and is  non-tender to palpation, there is no CVA tenderness EXT: Normal ROM in all joints; non-tender to palpation; no edema; normal capillary refill; no cyanosis    SKIN: Normal color for age and race; warm NEURO: Moves all extremities equally PSYCH: The patient's mood and manner are appropriate. Grooming and personal hygiene are appropriate.  MEDICAL DECISION MAKING: Patient here with constipation. Unable to tolerate disimpaction. Have attempted to enemas in the ED with a small bowel movement. Have advised family to continue to use over-the-counter regimens every single day for symptom control. Have recommended Metamucil, MiraLAX, Colace and Fleet enemas. Have discussed increasing water and fiber intake. I do not feel he has a bowel obstruction on exam as he has no abdominal pain, no distention, no nausea or vomiting and is able to tolerate by mouth. Discussed  return precautions. I feel they are safe to be discharged home. Family verbalize understanding and is comfortable with plan.   I personally performed the services described in this documentation, which was scribed in my presence. The recorded information has been reviewed and is accurate.   [place], DO 07/29/14 1545

## 2014-07-30 ENCOUNTER — Ambulatory Visit: Payer: Commercial Managed Care - HMO | Admitting: Neurology

## 2014-07-31 ENCOUNTER — Emergency Department (HOSPITAL_COMMUNITY)
Admission: EM | Admit: 2014-07-31 | Discharge: 2014-07-31 | Disposition: A | Discharge status: Home / Self Care | Payer: Medicare HMO | Attending: Emergency Medicine | Admitting: Emergency Medicine

## 2014-07-31 ENCOUNTER — Emergency Department (HOSPITAL_COMMUNITY): Payer: Medicare HMO

## 2014-07-31 ENCOUNTER — Encounter (HOSPITAL_COMMUNITY): Payer: Self-pay | Admitting: Emergency Medicine

## 2014-07-31 DIAGNOSIS — I2510 Atherosclerotic heart disease of native coronary artery without angina pectoris: Secondary | ICD-10-CM | POA: Diagnosis not present

## 2014-07-31 DIAGNOSIS — K219 Gastro-esophageal reflux disease without esophagitis: Secondary | ICD-10-CM | POA: Insufficient documentation

## 2014-07-31 DIAGNOSIS — Z9889 Other specified postprocedural states: Secondary | ICD-10-CM | POA: Insufficient documentation

## 2014-07-31 DIAGNOSIS — Z79899 Other long term (current) drug therapy: Secondary | ICD-10-CM | POA: Diagnosis not present

## 2014-07-31 DIAGNOSIS — F0280 Dementia in other diseases classified elsewhere without behavioral disturbance: Secondary | ICD-10-CM | POA: Diagnosis not present

## 2014-07-31 DIAGNOSIS — J449 Chronic obstructive pulmonary disease, unspecified: Secondary | ICD-10-CM | POA: Insufficient documentation

## 2014-07-31 DIAGNOSIS — Z87891 Personal history of nicotine dependence: Secondary | ICD-10-CM | POA: Diagnosis not present

## 2014-07-31 DIAGNOSIS — Z87448 Personal history of other diseases of urinary system: Secondary | ICD-10-CM | POA: Insufficient documentation

## 2014-07-31 DIAGNOSIS — Z7951 Long term (current) use of inhaled steroids: Secondary | ICD-10-CM | POA: Diagnosis not present

## 2014-07-31 DIAGNOSIS — G309 Alzheimer's disease, unspecified: Secondary | ICD-10-CM | POA: Diagnosis not present

## 2014-07-31 DIAGNOSIS — I10 Essential (primary) hypertension: Secondary | ICD-10-CM | POA: Insufficient documentation

## 2014-07-31 DIAGNOSIS — R109 Unspecified abdominal pain: Secondary | ICD-10-CM | POA: Diagnosis not present

## 2014-07-31 DIAGNOSIS — Z955 Presence of coronary angioplasty implant and graft: Secondary | ICD-10-CM | POA: Insufficient documentation

## 2014-07-31 DIAGNOSIS — Z7982 Long term (current) use of aspirin: Secondary | ICD-10-CM | POA: Diagnosis not present

## 2014-07-31 DIAGNOSIS — Z8589 Personal history of malignant neoplasm of other organs and systems: Secondary | ICD-10-CM | POA: Insufficient documentation

## 2014-07-31 DIAGNOSIS — K5900 Constipation, unspecified: Secondary | ICD-10-CM | POA: Diagnosis not present

## 2014-07-31 DIAGNOSIS — Z87438 Personal history of other diseases of male genital organs: Secondary | ICD-10-CM | POA: Insufficient documentation

## 2014-07-31 DIAGNOSIS — Z8639 Personal history of other endocrine, nutritional and metabolic disease: Secondary | ICD-10-CM | POA: Insufficient documentation

## 2014-07-31 NOTE — ED Provider Notes (Addendum)
CSN: [id number]     Arrival date & time 07/31/14  1224 History  This chart was scribed for Nat Christen, MD by Stephania Fragmin, ED Scribe. This patient was seen in room APA01/APA01 and the patient's care was started at 3:54 PM.    Chief Complaint  Patient presents with  . Constipation   The history is provided by the patient. No language interpreter was used.     HPI Comments: [name] [name] is a 81 y.o. male who presents to the Emergency Department complaining of recurrent constipation which became constant one week ago. Patient was here about 2 days ago; he had a digital rectal exam and 2 enemas with no relief, per wife. He denies loss of appetite and vomiting. Patient states he has been eating regularly.   Past Medical History  Diagnosis Date  . Renal insufficiency   . PVD (peripheral vascular disease)   . Hypertension   . Hyperlipemia   . GERD (gastroesophageal reflux disease)   . CAD (coronary artery disease)   . COPD (chronic obstructive pulmonary disease)   . Monoclonal gammopathy   . BPH (benign prostatic hyperplasia)   . DEMENTIA   . Alzheimer disease   . Memory loss    Past Surgical History  Procedure Laterality Date  . Coronary artery bypass graft  1997  . Left cea  2002  . Back surgery  2011  . Cataract extraction w/phaco  09/18/2011    Procedure: CATARACT EXTRACTION PHACO AND INTRAOCULAR LENS PLACEMENT (IOC);  Surgeon: Elta Guadeloupe T. Gershon Crane, MD;  Location: [facility];  Service: Ophthalmology;  Laterality: Right;  CDE: 6.19  . Cardiac surgery     Family History  Problem Relation Age of Onset  . Anesthesia problems Neg Hx   . Hypotension Neg Hx   . Malignant hyperthermia Neg Hx   . Pseudochol deficiency Neg Hx   . High blood pressure    . High Cholesterol     History  Substance Use Topics  . Smoking status: Former Smoker -- 0.50 packs/day for 40 years    Types: Cigarettes    Quit date: 07/23/1978  . Smokeless tobacco: Never Used  . Alcohol Use: No    Review of Systems   Constitutional: Negative for appetite change.  Gastrointestinal: Positive for constipation. Negative for vomiting.  All other systems reviewed and are negative.     Allergies  Review of patient's allergies indicates no known allergies.  Home Medications   Prior to Admission medications   Medication Sig Start Date End Date Taking? Authorizing Provider  acetaminophen (TYLENOL) 500 MG tablet Take 1,000 mg by mouth every 6 (six) hours as needed for headache.    Historical Provider, MD  albuterol (PROVENTIL HFA;VENTOLIN HFA) 108 (90 BASE) MCG/ACT inhaler Inhale 2 puffs into the lungs every 4 (four) hours as needed for wheezing or shortness of breath.    Historical Provider, MD  albuterol (PROVENTIL) (2.5 MG/3ML) 0.083% nebulizer solution Take 1 vial (70mL) 3-4 times per day. Patient not taking: Reported on 07/29/2014 12/28/13   Elsie Stain, MD  ALPRAZolam Duanne Moron) 0.5 MG tablet Take 1 tablet (0.5 mg total) by mouth daily. If needed 07/21/14   Marcial Pacas, MD  arformoterol (BROVANA) 15 MCG/2ML NEBU Take 2 mLs (15 mcg total) by nebulization 2 (two) times daily. 11/27/13   Elsie Stain, MD  aspirin 325 MG tablet Take 325 mg by mouth daily.      Historical Provider, MD  azelastine (ASTELIN) 0.1 % nasal spray  Place 1-2 sprays into both nostrils 2 (two) times daily. Use in each nostril as directed    Historical Provider, MD  bisacodyl (DULCOLAX) 5 MG EC tablet Take 5 mg by mouth daily as needed for moderate constipation.    Historical Provider, MD  calcitRIOL (ROCALTROL) 0.25 MCG capsule Take 0.25 mcg by mouth daily.  11/12/12   Historical Provider, MD  cholecalciferol (VITAMIN D) 1000 UNITS tablet Take 1,000 Units by mouth daily.    Historical Provider, MD  donepezil (ARICEPT) 10 MG tablet Take 1 tablet (10 mg total) by mouth at bedtime. 09/02/13   Marcial Pacas, MD  finasteride (PROSCAR) 5 MG tablet Take 5 mg by mouth daily.     Historical Provider, MD  fluticasone (FLONASE) 50 MCG/ACT nasal spray Place  2 sprays into both nostrils daily.    Historical Provider, MD  gabapentin (NEURONTIN) 300 MG capsule Take 1 capsule (300 mg total) by mouth 3 (three) times daily. Patient taking differently: Take 600 mg by mouth 3 (three) times daily.  01/25/14   Marcial Pacas, MD  HYDROcodone-acetaminophen (NORCO/VICODIN) 5-325 MG per tablet Take 0.5 tablets by mouth every 4 (four) hours as needed for moderate pain. Patient not taking: Reported on 07/29/2014 04/16/14   Orpah Greek, MD  latanoprost (XALATAN) 0.005 % ophthalmic solution Place 1 drop into both eyes at bedtime.  12/28/13   Historical Provider, MD  losartan (COZAAR) 50 MG tablet Take 1 tablet (50 mg total) by mouth daily. Patient not taking: Reported on 07/29/2014 10/24/11   Elsie Stain, MD  Memantine HCl ER (NAMENDA XR) 28 MG CP24 TAKE ONE CAPSULE BY MOUTH ONCE DAILY. Patient taking differently: Take 1 capsule by mouth daily.  07/13/14   Marcial Pacas, MD  mirtazapine (REMERON) 15 MG tablet Take 30 mg by mouth at bedtime.     Historical Provider, MD  Multiple Vitamin (MULTIVITAMIN WITH MINERALS) TABS tablet Take 1 tablet by mouth daily.    Historical Provider, MD  nortriptyline (PAMELOR) 10 MG capsule Take 20 mg by mouth at bedtime.    Historical Provider, MD  omeprazole (PRILOSEC) 40 MG capsule Take 40 mg by mouth 2 (two) times daily.  09/07/13   Historical Provider, MD  promethazine (PHENERGAN) 25 MG tablet 1-2 tablets by mouth every 6 hours as needed for nausea    Historical Provider, MD  Tamsulosin HCl (FLOMAX) 0.4 MG CAPS Take 0.4 mg by mouth at bedtime.     Historical Provider, MD   BP 158/82 mmHg  Pulse 85  Temp(Src) 98.7 F (37.1 C) (Core (Comment))  Resp 16  Wt 165 lb (74.844 kg)  SpO2 98% Physical Exam  Constitutional: He is oriented to person, place, and time. He appears well-developed and well-nourished.  HENT:  Head: Normocephalic and atraumatic.  Eyes: Conjunctivae and EOM are normal. Pupils are equal, round, and reactive to light.   Neck: Normal range of motion. Neck supple.  Cardiovascular: Normal rate and regular rhythm.   Pulmonary/Chest: Effort normal and breath sounds normal.  Abdominal: Soft. Bowel sounds are normal.  Musculoskeletal: Normal range of motion.  Neurological: He is alert and oriented to person, place, and time.  Skin: Skin is warm and dry.  Psychiatric: He has a normal mood and affect. His behavior is normal.  Nursing note and vitals reviewed.   ED Course  Fecal disimpaction Date/Time: 07/31/2014 6:18 PM Performed by: Nat Christen Authorized by: Nat Christen Comments: 18:00    manual fecal disimpaction performed with fleets enema administration. No  masses noted. Stool was at the level of the fingertip with consistency of clay.   Patient had small bowel movement after procedure.   (including critical care time)  DIAGNOSTIC STUDIES: Oxygen Saturation is 99% on room air, normal by my interpretation.    COORDINATION OF CARE: 3:56 PM - Discussed treatment plan with pt at bedside which includes XR abdomen and digital rectal exam and pt agreed to plan.   Labs Review Labs Reviewed - No data to display  Imaging Review Dg Abd Acute W/chest  07/31/2014   CLINICAL DATA:  Initial evaluation for lower abdominal pain and rectal pain for 3 days with constipation and nonproductive cough for several months which is also worse in last 3-4 days with generalized chest pain and with history of hypertension renal insufficiency peripheral vascular disease coronary artery disease and COPD with history of CABG in 1997  EXAM: ACUTE ABDOMEN SERIES (ABDOMEN 2 VIEW & CHEST 1 VIEW)  COMPARISON:  04/16/2014  FINDINGS: Mild cardiac enlargement. Vascular pattern normal. Prior CABG. Moderate diffuse interstitial change is stable consistent with scarring.  There appears to be right renal artery stent. There is stool trauma to the colon. Nondilated loops of small bowel demonstrate air-fluid levels. Gas is seen well into the colon. No  pneumoperitoneum identified. Fecal retention in the rectum distending the rectum to 9 cm. Bilateral iliac artery stents.  IMPRESSION: 1. COPD with bilateral parenchymal fibrosis, stable. 2. Nonobstructive gas pattern. Air-fluid levels in some loops of nondistended small bowel possibly due to ileus. 3. Constipation.  Fecal impaction in the rectum.   Electronically Signed   By: Skipper Cliche M.D.   On: 07/31/2014 17:18     EKG Interpretation None      MDM   Final diagnoses:  Constipation   No acute abdomen. Digital disimpaction performed by examiner. Fleets enema administered. Patient had small bowel movement. Discharge home with constipation instructions.  I personally performed the services described in this documentation, which was scribed in my presence. The recorded information has been reviewed and is accurate.    Nat Christen, MD 07/31/14 [place], MD 07/31/14 Tresa Moore

## 2014-07-31 NOTE — Discharge Instructions (Signed)
Constipation Constipation is when a person:  Poops (has a bowel movement) less than 3 times a week.  Has a hard time pooping.  Has poop that is dry, hard, or bigger than normal. HOME CARE   Eat foods with a lot of fiber in them. This includes fruits, vegetables, beans, and whole grains such as brown rice.  Avoid fatty foods and foods with a lot of sugar. This includes french fries, hamburgers, cookies, candy, and soda.  If you are not getting enough fiber from food, take products with added fiber in them (supplements).  Drink enough fluid to keep your pee (urine) clear or pale yellow.  Exercise on a regular basis, or as told by your doctor.  Go to the restroom when you feel like you need to poop. Do not hold it.  Only take medicine as told by your doctor. Do not take medicines that help you poop (laxatives) without talking to your doctor first. GET HELP RIGHT AWAY IF:   You have bright red blood in your poop (stool).  Your constipation lasts more than 4 days or gets worse.  You have belly (abdominal) or butt (rectal) pain.  You have thin poop (as thin as a pencil).  You lose weight, and it cannot be explained. MAKE SURE YOU:   Understand these instructions.  Will watch your condition.  Will get help right away if you are not doing well or get worse. Document Released: 12/26/2007 Document Revised: 07/14/2013 Document Reviewed: 04/20/2013 [facility] Patient Information 2015 [place], [place]. This information is not intended to replace advice given to you by your health care provider. Make sure you discuss any questions you have with your health care provider.   Increase fluids, fruits, fibers. Try magnesium citrate when constipation gets bad. Follow-up your primary care doctor.

## 2014-07-31 NOTE — ED Notes (Signed)
PT states he was seen last Thursday and had 2 enemas and started on stool softeners. PT reports hard stool felt and painful in rectum x3 days. PT denies any abdominal pain.

## 2014-07-31 NOTE — ED Notes (Signed)
Pt had small BM at this time, states he feels better and is ready to go home.

## 2014-08-02 DIAGNOSIS — J9610 Chronic respiratory failure, unspecified whether with hypoxia or hypercapnia: Secondary | ICD-10-CM | POA: Diagnosis not present

## 2014-08-04 DIAGNOSIS — N183 Chronic kidney disease, stage 3 (moderate): Secondary | ICD-10-CM | POA: Diagnosis not present

## 2014-08-04 DIAGNOSIS — N2581 Secondary hyperparathyroidism of renal origin: Secondary | ICD-10-CM | POA: Diagnosis not present

## 2014-08-04 DIAGNOSIS — D649 Anemia, unspecified: Secondary | ICD-10-CM | POA: Diagnosis not present

## 2014-08-09 ENCOUNTER — Ambulatory Visit: Payer: Commercial Managed Care - HMO | Admitting: Neurology

## 2014-08-09 DIAGNOSIS — N2581 Secondary hyperparathyroidism of renal origin: Secondary | ICD-10-CM | POA: Diagnosis not present

## 2014-08-09 DIAGNOSIS — N183 Chronic kidney disease, stage 3 (moderate): Secondary | ICD-10-CM | POA: Diagnosis not present

## 2014-08-09 DIAGNOSIS — I129 Hypertensive chronic kidney disease with stage 1 through stage 4 chronic kidney disease, or unspecified chronic kidney disease: Secondary | ICD-10-CM | POA: Diagnosis not present

## 2014-08-09 DIAGNOSIS — R300 Dysuria: Secondary | ICD-10-CM | POA: Diagnosis not present

## 2014-08-19 ENCOUNTER — Ambulatory Visit
Admission: RE | Admit: 2014-08-19 | Discharge: 2014-08-19 | Disposition: A | Discharge status: Home / Self Care | Payer: Commercial Managed Care - HMO | Source: Ambulatory Visit | Attending: Family Medicine | Admitting: Family Medicine

## 2014-08-19 ENCOUNTER — Other Ambulatory Visit: Payer: Self-pay | Admitting: Family Medicine

## 2014-08-19 DIAGNOSIS — R918 Other nonspecific abnormal finding of lung field: Secondary | ICD-10-CM | POA: Diagnosis not present

## 2014-08-19 DIAGNOSIS — J449 Chronic obstructive pulmonary disease, unspecified: Secondary | ICD-10-CM | POA: Diagnosis not present

## 2014-08-19 DIAGNOSIS — J069 Acute upper respiratory infection, unspecified: Secondary | ICD-10-CM

## 2014-08-19 DIAGNOSIS — Z87891 Personal history of nicotine dependence: Secondary | ICD-10-CM | POA: Diagnosis not present

## 2014-08-19 DIAGNOSIS — J309 Allergic rhinitis, unspecified: Secondary | ICD-10-CM | POA: Diagnosis not present

## 2014-08-19 DIAGNOSIS — Z951 Presence of aortocoronary bypass graft: Secondary | ICD-10-CM | POA: Diagnosis not present

## 2014-08-19 DIAGNOSIS — K5900 Constipation, unspecified: Secondary | ICD-10-CM | POA: Diagnosis not present

## 2014-08-27 ENCOUNTER — Ambulatory Visit (INDEPENDENT_AMBULATORY_CARE_PROVIDER_SITE_OTHER): Payer: Commercial Managed Care - HMO | Admitting: Cardiology

## 2014-08-27 ENCOUNTER — Encounter: Payer: Self-pay | Admitting: Cardiology

## 2014-08-27 VITALS — BP 166/92 | HR 80 | Ht 67.0 in | Wt 162.1 lb

## 2014-08-27 DIAGNOSIS — N183 Chronic kidney disease, stage 3 unspecified: Secondary | ICD-10-CM | POA: Insufficient documentation

## 2014-08-27 DIAGNOSIS — R0600 Dyspnea, unspecified: Secondary | ICD-10-CM | POA: Diagnosis not present

## 2014-08-27 DIAGNOSIS — R079 Chest pain, unspecified: Secondary | ICD-10-CM

## 2014-08-27 DIAGNOSIS — I2510 Atherosclerotic heart disease of native coronary artery without angina pectoris: Secondary | ICD-10-CM | POA: Diagnosis not present

## 2014-08-27 DIAGNOSIS — I2583 Coronary atherosclerosis due to lipid rich plaque: Principal | ICD-10-CM

## 2014-08-27 DIAGNOSIS — R0789 Other chest pain: Secondary | ICD-10-CM

## 2014-08-27 DIAGNOSIS — I10 Essential (primary) hypertension: Secondary | ICD-10-CM

## 2014-08-27 DIAGNOSIS — K219 Gastro-esophageal reflux disease without esophagitis: Secondary | ICD-10-CM

## 2014-08-27 DIAGNOSIS — G8929 Other chronic pain: Secondary | ICD-10-CM | POA: Insufficient documentation

## 2014-08-27 DIAGNOSIS — E785 Hyperlipidemia, unspecified: Secondary | ICD-10-CM

## 2014-08-27 DIAGNOSIS — J441 Chronic obstructive pulmonary disease with (acute) exacerbation: Secondary | ICD-10-CM | POA: Diagnosis not present

## 2014-08-27 MED ORDER — METOPROLOL SUCCINATE ER 25 MG PO TB24: 25.0000 mg | ORAL_TABLET | Freq: Every day | ORAL | Status: DC

## 2014-08-27 NOTE — Patient Instructions (Addendum)
Your physician has recommended you make the following change in your medication:  1) START Toprol XL 25 mg daily  Your physician recommends that you return for FASTING lab work in one week. (BMET, LFTs, Lipids).  Your physician has requested that you have an echocardiogram. Echocardiography is a painless test that uses sound waves to create images of your heart. It provides your doctor with information about the size and shape of your heart and how well your heart's chambers and valves are working. This procedure takes approximately one hour. There are no restrictions for this procedure.  Your physician has requested that you have a lexiscan myoview. For further information please visit [email or web address]. Please follow instruction sheet, as given.  Your physician has requested that you have a carotid duplex. This test is an ultrasound of the carotid arteries in your neck. It looks at blood flow through these arteries that supply the brain with blood. Allow one hour for this exam. There are no restrictions or special instructions.  Your physician recommends that you schedule a follow-up appointment in 2 weeks with a PA or NP.  Your physician recommends that you schedule a follow-up appointment in: 3 months with Dr. Radford Pax.

## 2014-08-27 NOTE — Progress Notes (Signed)
Cardiology Office Note   Date:  08/27/2014   ID:  [name] [name], DOB 1931-06-18, MRN [id number]  PCP:  Gara Kroner, MD  Cardiologist:   Sueanne Margarita, MD   Chief Complaint  Patient presents with  . Coronary Artery Disease  . Hypertension  . Hyperlipidemia      History of Present Illness: [name] [name] is a 81 y.o. male who presents for followup of his ASCAD.  He has a history of ASCAD s/p CABG in 1997 with known atretic LIMA to LAD and 40% ostial LM, PVD s/p left iliac stent and renal artery stenosis s/p left renal artery stent, carotid artery stenosis s/p left CEA, dementia, HTN, CKD, noncardiac chest wall pain, dyslipidemia and COPD.  Nuclear stress test in 2013 showed no ischemia.  He says that he has been having midsternal chest pain that is different from what he has had with his chronic chest wall pain in the past.  It is worse with exertion but can also be nonexertional.  It does not radiate and is similar to his anginal symptoms before his CABG.  He has been having SOB that has been occurring for several years and occurs with exertion.  He occasionally has some diaphoresis and nausea with the CP.  He says that he frequent nausea which does no necessarily occur with his CP.  He denies any LE edema.    He occasionally has some racing of his heart.  He had an episode of syncope a few months ago while going up the stairs at his house outside but had no symptoms beforehand and felt fine afterwards.    Past Medical History  Diagnosis Date  . PVD (peripheral vascular disease)     s/p left renal artery stent and left iliac stent  . Hypertension   . Hyperlipemia   . GERD (gastroesophageal reflux disease)   . COPD (chronic obstructive pulmonary disease)   . Monoclonal gammopathy   . BPH (benign prostatic hyperplasia)   . DEMENTIA   . Alzheimer disease   . Memory loss   . CAD (coronary artery disease)     s/p CABG in 1997 with known atretic LIMA to LAD and 40% LM.    Marland Kitchen  Chronic kidney disease (CKD), stage III (moderate)   . Carotid artery stenosis     s/p left CEA  . Chronic chest wall pain     Past Surgical History  Procedure Laterality Date  . Coronary artery bypass graft  1997  . Left cea  2002  . Back surgery  2011  . Cataract extraction w/phaco  09/18/2011    Procedure: CATARACT EXTRACTION PHACO AND INTRAOCULAR LENS PLACEMENT (IOC);  Surgeon: Elta Guadeloupe T. Gershon Crane, MD;  Location: [facility];  Service: Ophthalmology;  Laterality: Right;  CDE: 6.19  . Cardiac surgery       Current Outpatient Prescriptions  Medication Sig Dispense Refill  . acetaminophen (TYLENOL) 500 MG tablet Take 1,000 mg by mouth every 6 (six) hours as needed for headache.    . albuterol (PROVENTIL HFA;VENTOLIN HFA) 108 (90 BASE) MCG/ACT inhaler Inhale 2 puffs into the lungs every 4 (four) hours as needed for wheezing or shortness of breath.    Marland Kitchen albuterol (PROVENTIL) (2.5 MG/3ML) 0.083% nebulizer solution Take 1 vial (61mL) 3-4 times per day. 1080 mL 1  . ALPRAZolam (XANAX) 0.5 MG tablet Take 1 tablet (0.5 mg total) by mouth daily. If needed 90 tablet 1  . arformoterol (BROVANA) 15 MCG/2ML NEBU Take 2  mLs (15 mcg total) by nebulization 2 (two) times daily. 120 mL 6  . aspirin 325 MG tablet Take 325 mg by mouth daily.      Marland Kitchen azelastine (ASTELIN) 0.1 % nasal spray Place 1-2 sprays into both nostrils 2 (two) times daily. Use in each nostril as directed    . bisacodyl (DULCOLAX) 5 MG EC tablet Take 5 mg by mouth daily as needed for moderate constipation.    . calcitRIOL (ROCALTROL) 0.5 MCG capsule Take 0.5 mcg by mouth daily.    Marland Kitchen donepezil (ARICEPT) 10 MG tablet Take 1 tablet (10 mg total) by mouth at bedtime. 90 tablet 3  . finasteride (PROSCAR) 5 MG tablet Take 5 mg by mouth daily.     . fluticasone (FLONASE) 50 MCG/ACT nasal spray Place 2 sprays into both nostrils daily.    Marland Kitchen gabapentin (NEURONTIN) 300 MG capsule Take 1 capsule (300 mg total) by mouth 3 (three) times daily. (Patient taking  differently: Take 600 mg by mouth 3 (three) times daily. ) 270 capsule 3  . HYDROcodone-acetaminophen (NORCO/VICODIN) 5-325 MG per tablet Take 0.5 tablets by mouth every 4 (four) hours as needed for moderate pain. 10 tablet 0  . latanoprost (XALATAN) 0.005 % ophthalmic solution Place 1 drop into both eyes at bedtime.     . Memantine HCl ER (NAMENDA XR) 28 MG CP24 TAKE ONE CAPSULE BY MOUTH ONCE DAILY. (Patient taking differently: Take 1 capsule by mouth daily. ) 90 capsule 1  . mirtazapine (REMERON) 15 MG tablet Take 30 mg by mouth at bedtime.     . Multiple Vitamin (MULTIVITAMIN WITH MINERALS) TABS tablet Take 1 tablet by mouth daily.    . nortriptyline (PAMELOR) 10 MG capsule Take 20 mg by mouth at bedtime.    Marland Kitchen omeprazole (PRILOSEC) 40 MG capsule Take 40 mg by mouth 2 (two) times daily.     . promethazine (PHENERGAN) 25 MG tablet 1-2 tablets by mouth every 6 hours as needed for nausea    . Tamsulosin HCl (FLOMAX) 0.4 MG CAPS Take 0.4 mg by mouth at bedtime.      No current facility-administered medications for this visit.    Allergies:   Review of patient's allergies indicates no known allergies.    Social History:  The patient  reports that he quit smoking about 36 years ago. His smoking use included Cigarettes. He has a 20 pack-year smoking history. He has never used smokeless tobacco. He reports that he does not drink alcohol or use illicit drugs.   Family History:  The patient's family history includes High Cholesterol in an other family member; High blood pressure in an other family member; Hypertension in his father. There is no history of Anesthesia problems, Hypotension, Malignant hyperthermia, or Pseudochol deficiency.    ROS:  Please see the history of present illness.   Otherwise, review of systems are positive for none.   All other systems are reviewed and negative.    PHYSICAL EXAM: VS:  BP 166/92 mmHg  Pulse 80  Ht 5\' 7"  (1.702 m)  Wt 162 lb 1.9 oz (73.537 kg)  BMI 25.39  kg/m2  SpO2 96% , BMI Body mass index is 25.39 kg/(m^2). GEN: Well nourished, well developed, in no acute distress HEENT: normal Neck: no JVD, carotid bruits, or masses Cardiac: RRR; no murmurs, rubs, or gallops,no edema  Respiratory:  clear to auscultation bilaterally, normal work of breathing GI: soft, nontender, nondistended, + BS MS: no deformity or atrophy Skin: warm and dry,  no rash Neuro:  Strength and sensation are intact Psych: euthymic mood, full affect   EKG:  EKG is ordered today. The ekg ordered today demonstrates NSR with no ST changes   Recent Labs: 01/27/2014: TSH 0.62 04/16/2014: ALT 16; BUN 22; Creatinine 2.31*; Hemoglobin 12.6*; Platelets 172; Potassium 4.4; Pro B Natriuretic peptide (BNP) 152.4; Sodium 142    Lipid Panel No results found for: CHOL, TRIG, HDL, CHOLHDL, VLDL, LDLCALC, LDLDIRECT    Wt Readings from Last 3 Encounters:  08/27/14 162 lb 1.9 oz (73.537 kg)  07/31/14 165 lb (74.844 kg)  07/29/14 169 lb (76.658 kg)      ASSESSMENT AND PLAN:  1.  ASCAD s/p remote CABG with known atretic LIMA to LAD and 40% LM.  Continue ASA.  Check Lexiscan myoview to rule out ischemia and 2D echo to assess LVF.   2.  HTN poorly controlled and has been elevated recently at home after meds stopped by renal MD.  Start Toprol 25mg  daily.  Folllowup with PA in 2 weeks to recheck BP. 3.  Dyslipidemia - he stopped statin.  Will check FLP and ALT 4.  Carotid artery stenosis s/p left CEA with right carotid bruit - he has not followed up with vascular surgery recently.  Check carotid dopplers 5.  Renal artery stenosis s/p left renal artery stent 6.  GERD 7.  COPD 8.  CKD stage 3   Current medicines are reviewed at length with the patient today.  The patient does not have concerns regarding medicines.  The following changes have been made:  no change  Labs/ tests ordered today include: Lexiscan myoview, carotid artery dopplers, 2D echo  No orders of the defined types  were placed in this encounter.     Disposition:   FU with me in 3 months   Signed, Sueanne Margarita, MD  08/27/2014 10:31 AM    [facility] [address] [place], [place], [place]  [address] Phone: [phone]; Fax: [phone]

## 2014-08-30 ENCOUNTER — Other Ambulatory Visit (HOSPITAL_COMMUNITY): Payer: Self-pay | Admitting: Cardiology

## 2014-08-30 DIAGNOSIS — I6523 Occlusion and stenosis of bilateral carotid arteries: Secondary | ICD-10-CM

## 2014-08-31 ENCOUNTER — Ambulatory Visit: Payer: Commercial Managed Care - HMO | Admitting: Neurology

## 2014-09-01 ENCOUNTER — Telehealth: Payer: Self-pay | Admitting: Cardiology

## 2014-09-01 ENCOUNTER — Telehealth: Payer: Self-pay

## 2014-09-01 NOTE — Telephone Encounter (Signed)
Follow up ° ° ° ° ° °Returning Katy's call °

## 2014-09-01 NOTE — Telephone Encounter (Signed)
Patient and his wife asking for prices of up-coming procedures.  Instructed them to speak with their insurance company for figures, as they are the ones who bill them. They agree to call insurance tomorrow and will call our office if they want to cancel any procedures.

## 2014-09-01 NOTE — Telephone Encounter (Signed)
Error

## 2014-09-01 NOTE — Telephone Encounter (Signed)
Left message for patient that I was told by the [facility] to call him per patient request.

## 2014-09-02 DIAGNOSIS — J9610 Chronic respiratory failure, unspecified whether with hypoxia or hypercapnia: Secondary | ICD-10-CM | POA: Diagnosis not present

## 2014-09-07 ENCOUNTER — Other Ambulatory Visit (HOSPITAL_COMMUNITY): Payer: Commercial Managed Care - HMO

## 2014-09-07 ENCOUNTER — Encounter (HOSPITAL_COMMUNITY): Payer: Commercial Managed Care - HMO

## 2014-09-07 ENCOUNTER — Other Ambulatory Visit: Payer: Commercial Managed Care - HMO

## 2014-09-13 ENCOUNTER — Ambulatory Visit: Payer: Commercial Managed Care - HMO | Admitting: Physician Assistant

## 2014-09-15 ENCOUNTER — Emergency Department (HOSPITAL_COMMUNITY)
Admission: EM | Admit: 2014-09-15 | Discharge: 2014-09-15 | Disposition: A | Discharge status: Home / Self Care | Payer: Commercial Managed Care - HMO | Attending: Emergency Medicine | Admitting: Emergency Medicine

## 2014-09-15 ENCOUNTER — Emergency Department (HOSPITAL_COMMUNITY): Payer: Commercial Managed Care - HMO

## 2014-09-15 ENCOUNTER — Encounter (HOSPITAL_COMMUNITY): Payer: Self-pay | Admitting: *Deleted

## 2014-09-15 DIAGNOSIS — Z79899 Other long term (current) drug therapy: Secondary | ICD-10-CM | POA: Diagnosis not present

## 2014-09-15 DIAGNOSIS — R0789 Other chest pain: Secondary | ICD-10-CM | POA: Diagnosis not present

## 2014-09-15 DIAGNOSIS — Z8639 Personal history of other endocrine, nutritional and metabolic disease: Secondary | ICD-10-CM | POA: Insufficient documentation

## 2014-09-15 DIAGNOSIS — Z7982 Long term (current) use of aspirin: Secondary | ICD-10-CM | POA: Insufficient documentation

## 2014-09-15 DIAGNOSIS — G8929 Other chronic pain: Secondary | ICD-10-CM | POA: Insufficient documentation

## 2014-09-15 DIAGNOSIS — K219 Gastro-esophageal reflux disease without esophagitis: Secondary | ICD-10-CM | POA: Diagnosis not present

## 2014-09-15 DIAGNOSIS — I252 Old myocardial infarction: Secondary | ICD-10-CM | POA: Diagnosis not present

## 2014-09-15 DIAGNOSIS — N183 Chronic kidney disease, stage 3 (moderate): Secondary | ICD-10-CM | POA: Diagnosis not present

## 2014-09-15 DIAGNOSIS — G309 Alzheimer's disease, unspecified: Secondary | ICD-10-CM | POA: Insufficient documentation

## 2014-09-15 DIAGNOSIS — F0280 Dementia in other diseases classified elsewhere without behavioral disturbance: Secondary | ICD-10-CM | POA: Insufficient documentation

## 2014-09-15 DIAGNOSIS — Z7952 Long term (current) use of systemic steroids: Secondary | ICD-10-CM | POA: Diagnosis not present

## 2014-09-15 DIAGNOSIS — Z87891 Personal history of nicotine dependence: Secondary | ICD-10-CM | POA: Insufficient documentation

## 2014-09-15 DIAGNOSIS — Z951 Presence of aortocoronary bypass graft: Secondary | ICD-10-CM | POA: Insufficient documentation

## 2014-09-15 DIAGNOSIS — J984 Other disorders of lung: Secondary | ICD-10-CM | POA: Diagnosis not present

## 2014-09-15 DIAGNOSIS — J449 Chronic obstructive pulmonary disease, unspecified: Secondary | ICD-10-CM | POA: Insufficient documentation

## 2014-09-15 DIAGNOSIS — I129 Hypertensive chronic kidney disease with stage 1 through stage 4 chronic kidney disease, or unspecified chronic kidney disease: Secondary | ICD-10-CM | POA: Insufficient documentation

## 2014-09-15 DIAGNOSIS — J441 Chronic obstructive pulmonary disease with (acute) exacerbation: Secondary | ICD-10-CM | POA: Diagnosis not present

## 2014-09-15 DIAGNOSIS — F17210 Nicotine dependence, cigarettes, uncomplicated: Secondary | ICD-10-CM | POA: Diagnosis not present

## 2014-09-15 DIAGNOSIS — R079 Chest pain, unspecified: Secondary | ICD-10-CM | POA: Diagnosis not present

## 2014-09-15 LAB — BASIC METABOLIC PANEL
BUN: 19 mg/dL (ref 6–23)
CALCIUM: 8.8 mg/dL (ref 8.4–10.5)
CO2: 27 mmol/L (ref 19–32)
Chloride: 109 mmol/L (ref 96–112)
Creatinine, Ser: 1.97 mg/dL — ABNORMAL HIGH (ref 0.50–1.35)
GFR, EST AFRICAN AMERICAN: 35 mL/min — AB (ref >90)
GFR, EST NON AFRICAN AMERICAN: 30 mL/min — AB (ref >90)
Glucose, Bld: 104 mg/dL — ABNORMAL HIGH (ref 70–99)
Potassium: 5.1 mmol/L (ref 3.5–5.1)
SODIUM: 137 mmol/L (ref 135–145)

## 2014-09-15 LAB — CBC WITH DIFFERENTIAL/PLATELET
BASOS PCT: 0 % (ref 0–1)
Basophils Absolute: 0 10*3/uL (ref 0.0–0.1)
Eosinophils Absolute: 0.2 10*3/uL (ref 0.0–0.7)
Eosinophils Relative: 2 % (ref 0–5)
HCT: 34.6 % — ABNORMAL LOW (ref 39.0–52.0)
Hemoglobin: 11.6 g/dL — ABNORMAL LOW (ref 13.0–17.0)
Lymphocytes Relative: 30 % (ref 12–46)
Lymphs Abs: 2 10*3/uL (ref 0.7–4.0)
MCH: 33.3 pg (ref 26.0–34.0)
MCHC: 33.5 g/dL (ref 30.0–36.0)
MCV: 99.4 fL (ref 78.0–100.0)
Monocytes Absolute: 0.5 10*3/uL (ref 0.1–1.0)
Monocytes Relative: 7 % (ref 3–12)
NEUTROS PCT: 61 % (ref 43–77)
Neutro Abs: 4 10*3/uL (ref 1.7–7.7)
PLATELETS: 187 10*3/uL (ref 150–400)
RBC: 3.48 MIL/uL — AB (ref 4.22–5.81)
RDW: 14.1 % (ref 11.5–15.5)
WBC: 6.6 10*3/uL (ref 4.0–10.5)

## 2014-09-15 LAB — TROPONIN I

## 2014-09-15 MED ORDER — ALBUTEROL SULFATE HFA 108 (90 BASE) MCG/ACT IN AERS: 2.0000 | INHALATION_SPRAY | RESPIRATORY_TRACT | Status: DC | PRN

## 2014-09-15 MED ORDER — PREDNISONE 20 MG PO TABS: 40.0000 mg | ORAL_TABLET | Freq: Every day | ORAL | Status: DC

## 2014-09-15 MED ORDER — HYDROCODONE-ACETAMINOPHEN 5-325 MG PO TABS: ORAL_TABLET | ORAL | Status: DC

## 2014-09-15 MED ORDER — IPRATROPIUM-ALBUTEROL 0.5-2.5 (3) MG/3ML IN SOLN
3.0000 mL | Freq: Once | RESPIRATORY_TRACT | Status: AC
  Administered 2014-09-15: 3 mL via RESPIRATORY_TRACT
  Filled 2014-09-15: qty 3

## 2014-09-15 MED ORDER — DOXYCYCLINE HYCLATE 100 MG PO TABS: 100.0000 mg | ORAL_TABLET | Freq: Two times a day (BID) | ORAL | Status: DC

## 2014-09-15 MED ORDER — PREDNISONE 50 MG PO TABS
60.0000 mg | ORAL_TABLET | Freq: Once | ORAL | Status: AC
  Administered 2014-09-15: 60 mg via ORAL
  Filled 2014-09-15 (×2): qty 1

## 2014-09-15 MED ORDER — ACETAMINOPHEN 325 MG PO TABS
650.0000 mg | ORAL_TABLET | Freq: Once | ORAL | Status: AC
  Administered 2014-09-15: 650 mg via ORAL
  Filled 2014-09-15: qty 2

## 2014-09-15 NOTE — Discharge Instructions (Signed)
°Emergency Department Resource Guide °1) Find a Doctor and Pay Out of Pocket °Although you won't have to find out who is covered by your insurance plan, it is a good idea to ask around and get recommendations. You will then need to call the office and see if the doctor you have chosen will accept you as a new patient and what types of options they offer for patients who are self-pay. Some doctors offer discounts or will set up payment plans for their patients who do not have insurance, but you will need to ask so you aren't surprised when you get to your appointment. ° °2) Contact Your Local Health Department °Not all health departments have doctors that can see patients for sick visits, but many do, so it is worth a call to see if yours does. If you don't know where your local health department is, you can check in your phone book. The CDC also has a tool to help you locate your state's health department, and many state websites also have listings of all of their local health departments. ° °3) Find a Walk-in Clinic °If your illness is not likely to be very severe or complicated, you may want to try a walk in clinic. These are popping up all over the country in pharmacies, drugstores, and shopping centers. They're usually staffed by nurse practitioners or physician assistants that have been trained to treat common illnesses and complaints. They're usually fairly quick and inexpensive. However, if you have serious medical issues or chronic medical problems, these are probably not your best option. ° °No Primary Care Doctor: °- Call [facility] Connect at  832-8000 - they can help you locate a primary care doctor that  accepts your insurance, provides certain services, etc. °- Physician Referral Service- 1-800-533-3463 ° °Chronic Pain Problems: °Organization         Address  Phone   Notes  °[facility]  ([phone]) [phone] Patients need to be referred by their primary care doctor.  ° °Medication  Assistance: °Organization         Address  Phone   Notes  °[place] [facility] [address] °[place], [place] [address] (336) 641-8030 --Must be a resident of [place] °-- Must have NO insurance coverage whatsoever (no Medicaid/ Medicare, etc.) °-- The pt. MUST have a primary care doctor that directs their care regularly and follows them in the community °  °[facility]  (866) 331-1348   °[employer]  (888) 892-1162   ° °Agencies that provide inexpensive medical care: °Organization         Address  Phone   Notes  °[facility]  (336) 832-8035   °[facility]    (336) 832-7272   °[facility] [address] °[place], [place] [address] (336) 832-4777   °[facility] [address], °[place] (336) 271-4999   °[facility]    (336) 373-0678   °[facility]    (336) 272-1050   °[facility] ° [address], [place] Phone:  (336) 832-4444, Fax:  (336) 832-4440 Hours of Operation:  9 am - 6 pm, M-F.  Also accepts Medicaid/Medicare and self-pay.  °[facility] ° [address], [place] Phone: (336) [phone], Fax: (336) [phone]. Hours of Operation:  8:30 am - 5:30 pm, M-F.  Also accepts Medicaid and self-pay.  °[facility] [place] [address], [place] Phone: (336) 878-6027   °[facility] [address], [place], [place] (336)723-1848, Ext. 123 Mondays & Thursdays: 7-9 AM.  First 15 patients are seen on a first come, first serve basis. °  ° °Medicaid-accepting [place] Providers: ° °Organization         Address  Phone   Notes  °[facility] [address], [place] (336) 641-2100 Also accepts self-pay patients.  °[facility] [address], [place] ° (336) 856-9996   °[facility] [address], [place]  (336) 288-8857   °[facility] [address] [place] [address], [place] (336) 299-7000   °Veita Bland [address], [place]  ° (336) 373-1557 Only accepts [place] Access Medicaid patients after they have their name applied to their card.  ° °Self-Pay (no insurance) in [place]: ° °Organization         Address  Phone   Notes  °Sickle Cell Patients, [facility] [address], [place] (336) 832-1970   °[facility] [address], [place] (336) 832-4400   °[facility] ° [address], [place] (336) 992-4800   °[facility]/Dr. Osei-Bonsu ° [address] [place] [address], [place] or [address], [place] (336) [phone] Phone number for both [place] and [place] locations is the same.  °[facility] [address], [place] (336) 299-0000   °[facility] [address] [place] [address], [place] or [address] (336) 852-7530 °(336) 878-2260   °[facility] [address], [place] (336) 350-1642, phone; (336) 294-5005, fax Sees patients 1st and 3rd Saturday of every month.  Must not qualify for public or private insurance (i.e. Medicaid, Medicare, [place] Health Choice, Veterans' Benefits) • Household income should be no more than 200% of the poverty level •The clinic cannot treat you if you are pregnant or think you are pregnant • Sexually transmitted diseases are not treated at the clinic.  ° ° °Dental Care: °Organization         Address  Phone  Notes  °[place] [facility] [address],  (336) 641-6152 Accepts children up to age 21 who are enrolled in Medicaid or [place] Health Choice; pregnant women with a Medicaid card; and children who have applied for Medicaid or [place] Health Choice, but were declined, whose parents can pay a reduced fee at time of service.  °[place]  [facility] [place]  [address], [place] (336) 641-7733 Accepts children up to age 21 who are enrolled in Medicaid or [place] Health Choice; pregnant women with a Medicaid card; and children who have applied for Medicaid or [place] Health Choice, but were declined, whose parents can pay a reduced fee at time of service.  °[facility] ° [address],  (336) 641-4533 Patients are seen by appointment only. Walk-ins are not accepted. [facility] will see patients 18 years of age and older. °Monday - Tuesday (8am-5pm) °Most Wednesdays (8:30-5pm) °$30 per visit, cash only  °[facility] ° [address], [place] (336) 641-4533 Patients are seen by appointment only. Walk-ins are not accepted. [facility] will see patients 18 years of age and older. °One   Wednesday Evening (Monthly: Volunteer Based).  $30 per visit, cash only  °[facility]  (919) [phone] for adults; Children under age 4, call Graduate Pediatric Dentistry at ([phone]) [phone]. Children aged 4-14, please call ([phone]) [phone] to request a pediatric application. ° Dental services are provided in all areas of dental care including fillings, crowns and bridges, complete and partial dentures, implants, gum treatment, root canals, and extractions. Preventive care is also provided. Treatment is provided to both adults and children. °Patients are selected via a lottery and there is often a waiting list. °  °[facility] [address], °[place] ° (336) 763-8833 [email or web address].[email or web address] °  °[facility] [address], [place], [place] (336)723-1848, Ext. 123 Second and Fourth Thursday of each month, opens at 6:30 AM; Clinic ends at 9 AM.  Patients are seen on a first-come first-served basis, and a limited number are seen during each clinic.  ° °[facility] ° [address], [place], [place] (336) [phone]    Eligibility Requirements °You must have lived in [place], [place], or [place] counties for at least the last three months. °  You cannot be eligible for state or federal sponsored healthcare insurance, including Veterans Administration, Medicaid, or Medicare. °  You generally cannot be eligible for healthcare insurance through your employer.  °  How to apply: °Eligibility screenings are held every Tuesday and Wednesday afternoon from 1:00 pm until 4:00 pm. You do not need an appointment for the interview!  °[facility] [address], [place], [place] 336-631-2330   °[facility]  336-342-8273   °[facility]  336-703-3100   °[facility]  336-570-6415   ° °Behavioral Health Resources in the Community: °Intensive Outpatient Programs °Organization         Address  Phone  Notes  °[facility] [address], [place], [place] 336-878-6098   °[facility] [address], [place], [place] 336-832-9800   °[facility] [address], [place], [place] ° 336-882-2125   °[facility] [address],  °[place], [place] 1-800-853-5163 or 336-641-4981   °Substance Abuse Resources °Organization         Address  Phone  Notes  °[facility]  336-882-2125   °[facility]  336-784-9470   °[facility]  336-285-9073   °[facility]  336-845-3988   °Residential & Outpatient Substance Abuse Program  1-800-659-3381   °Psychological Services °Organization         Address  Phone  Notes  °[facility]  336- 832-9600   °[facility]  336- 378-7881   °[facility] [address], [place] 1-800-853-5163 or 336-641-4981   ° °Mobile Crisis Teams °Organization         Address  Phone  Notes  °[facility], Mobile Crisis Care Unit  1-877-626-1772   °Assertive °[facility] ° [address]  [place], [place] 336-834-9664   °Sharon DeEsch [address] °[place] 336-554-5454   ° °Self-Help/Support Groups °Organization         Address  Phone             Notes  °[facility] - variety of support groups  336- 373-1402 Call for more information  °Narcotics Anonymous (NA), [facility] [address], °[place]   2 meetings at this location  ° °  Residential Treatment Programs Organization         Address  Phone  Notes  [facility] [address]    [place]  1-973-460-6917   [facility]  [address], [place] [address], [place], [place]   [place], [place] (712)134-1122 Admissions: 8am-3pm M-F  [facility] [place] [address]    [place], [place] 583-094-0768   [facility] [address] [place], [place], [place]   [facility] [address]  [place], [place]   [facility] Intensive Outpatient [address], [place], [place]   [facility] [place]  [place], [place] 1-684 569 7228 or 971-046-2942   [facility] [address], [place], [place] Accepts Medicaid  [facility] [place] [address]  [place] 1-709-174-4854 Substance Abuse/Addiction Treatment   [facility]         Address  Phone  Notes  [facility]  [phone]   Domenic Schwab, PhD [address] [place], [place]   904 538 7863 or [phone]   [place], [place] [phone]   [facility] [address] [place], [place] [phone] Insurance/Medicaid/sponsorship through [facility] [address]                                    [place], [place] [phone] [place] [address], [place] ([phone]    Dr. Adele Schilder  [phone]   [facility] Dept. 1) [address] [place] 2) [place] 3)  [address], [place] [phone] [phone]  [phone]   [facility] (443)291-0707 or ([phone] (After Hours)      Take the prescriptions as directed: do not take extra tylenol when taking the prescription pain medication because it already contains tylenol. Use your albuterol inhaler (2 to 4 puffs) or your albuterol nebulizer (1 unit dose) every 4 hours for the next 7 days, then as needed for cough, wheezing, or shortness of breath.  Apply moist heat or ice to the area(s) of discomfort, for 15 minutes at a time, several times per day for the next few days.  Do not fall asleep on a heating or ice pack.  Call your regular medical doctor today to schedule a follow up appointment in the next 2 days.  Return to the Emergency Department immediately if worsening.

## 2014-09-15 NOTE — ED Notes (Signed)
Pt co chest pain across center of chest "for awhile" hurts more today, pt tender on palpation, pt states "i have been coughing a lot too".

## 2014-09-15 NOTE — ED Notes (Signed)
Pt alert & oriented x4, stable gait. Patient given discharge instructions, paperwork & prescription(s). Patient  instructed to stop at the registration desk to finish any additional paperwork. Patient verbalized understanding. Pt left department w/ no further questions. 

## 2014-09-15 NOTE — ED Provider Notes (Signed)
CSN: [id number]     Arrival date & time 09/15/14  1113 History   First MD Initiated Contact with Patient 09/15/14 1216     Chief Complaint  Patient presents with  . Chest Pain     The history is provided by the patient and the spouse. The history is limited by the condition of the patient (Hx dementia).  Pt was seen at 1225. Per pt and his wife: c/o gradual onset and persistence of constant anterior chest wall "pain" for the past 2 weeks. Pain worsens with palpation of the area, coughing, and body position changes. Describes the CP as "sharp." Pt's wife states pt also has had runny/stuffy nose, sinus congestion and "doing a lot of coughing" for the past 1 to 2 weeks. Pt has been using his home nebs without change in his symptoms. Denies fevers, no back pain, no abd pain, no N/V/D, no SOB, no palpitations. The patient has a significant history of similar chest wall pain symptoms previously, recently being evaluated for this complaint and multiple prior evals for same.      Past Medical History  Diagnosis Date  . PVD (peripheral vascular disease)     s/p left renal artery stent and left iliac stent  . Hypertension   . Hyperlipemia   . GERD (gastroesophageal reflux disease)   . COPD (chronic obstructive pulmonary disease)   . Monoclonal gammopathy   . BPH (benign prostatic hyperplasia)   . DEMENTIA   . Alzheimer disease   . Memory loss   . CAD (coronary artery disease)     s/p CABG in 1997 with known atretic LIMA to LAD and 40% LM.    Marland Kitchen Chronic kidney disease (CKD), stage III (moderate)   . Carotid artery stenosis     s/p left CEA  . Chronic chest wall pain    Past Surgical History  Procedure Laterality Date  . Coronary artery bypass graft  1997  . Left cea  2002  . Back surgery  2011  . Cataract extraction w/phaco  09/18/2011    Procedure: CATARACT EXTRACTION PHACO AND INTRAOCULAR LENS PLACEMENT (IOC);  Surgeon: Elta Guadeloupe T. Gershon Crane, MD;  Location: [facility];  Service: Ophthalmology;   Laterality: Right;  CDE: 6.19  . Cardiac surgery     Family History  Problem Relation Age of Onset  . Anesthesia problems Neg Hx   . Hypotension Neg Hx   . Malignant hyperthermia Neg Hx   . Pseudochol deficiency Neg Hx   . High blood pressure    . High Cholesterol    . Hypertension Father    History  Substance Use Topics  . Smoking status: Former Smoker -- 0.50 packs/day for 40 years    Types: Cigarettes    Quit date: 07/23/1978  . Smokeless tobacco: Never Used  . Alcohol Use: No    Review of Systems  Unable to perform ROS: Dementia      Allergies  Review of patient's allergies indicates no known allergies.  Home Medications   Prior to Admission medications   Medication Sig Start Date End Date Taking? Authorizing Provider  acetaminophen (TYLENOL) 500 MG tablet Take 1,000 mg by mouth every 6 (six) hours as needed for headache.   Yes Historical Provider, MD  albuterol (PROVENTIL HFA;VENTOLIN HFA) 108 (90 BASE) MCG/ACT inhaler Inhale 2 puffs into the lungs every 4 (four) hours as needed for wheezing or shortness of breath.   Yes Historical Provider, MD  albuterol (PROVENTIL) (2.5 MG/3ML) 0.083% nebulizer solution  Take 1 vial (40mL) 3-4 times per day. Patient taking differently: Take 2.5 mg by nebulization every 6 (six) hours as needed for wheezing or shortness of breath. Take 1 vial (90mL) 3-4 times per day. 12/28/13  Yes Elsie Stain, MD  ALPRAZolam Duanne Moron) 0.5 MG tablet Take 1 tablet (0.5 mg total) by mouth daily. If needed Patient taking differently: Take 0.5 mg by mouth daily.  07/21/14  Yes Marcial Pacas, MD  aspirin 325 MG tablet Take 325 mg by mouth daily.     Yes Historical Provider, MD  azelastine (ASTELIN) 0.1 % nasal spray Place 1-2 sprays into both nostrils 2 (two) times daily. Use in each nostril as directed   Yes Historical Provider, MD  bisacodyl (DULCOLAX) 5 MG EC tablet Take 5 mg by mouth daily as needed for moderate constipation.   Yes Historical Provider, MD   calcitRIOL (ROCALTROL) 0.5 MCG capsule Take 0.5 mcg by mouth daily. 07/29/14  Yes Historical Provider, MD  Cholecalciferol (VITAMIN D PO) Take 1 tablet by mouth daily.   Yes Historical Provider, MD  donepezil (ARICEPT) 10 MG tablet Take 1 tablet (10 mg total) by mouth at bedtime. 09/02/13  Yes Marcial Pacas, MD  finasteride (PROSCAR) 5 MG tablet Take 5 mg by mouth daily.    Yes Historical Provider, MD  fluticasone (FLONASE) 50 MCG/ACT nasal spray Place 2 sprays into both nostrils daily.   Yes Historical Provider, MD  gabapentin (NEURONTIN) 300 MG capsule Take 1 capsule (300 mg total) by mouth 3 (three) times daily. Patient taking differently: Take 300 mg by mouth daily.  01/25/14  Yes Marcial Pacas, MD  latanoprost (XALATAN) 0.005 % ophthalmic solution Place 1 drop into both eyes at bedtime.  12/28/13  Yes Historical Provider, MD  Memantine HCl ER (NAMENDA XR) 28 MG CP24 TAKE ONE CAPSULE BY MOUTH ONCE DAILY. Patient taking differently: Take 1 capsule by mouth daily.  07/13/14  Yes Marcial Pacas, MD  metoprolol succinate (TOPROL XL) 25 MG 24 hr tablet Take 1 tablet (25 mg total) by mouth daily. 08/27/14  Yes Sueanne Margarita, MD  mirtazapine (REMERON) 15 MG tablet Take 30 mg by mouth at bedtime.    Yes Historical Provider, MD  Multiple Vitamin (MULTIVITAMIN WITH MINERALS) TABS tablet Take 1 tablet by mouth daily.   Yes Historical Provider, MD  Naphazoline-Glycerin (CLEAR EYES REDNESS RELIEF OP) Apply 1 drop to eye daily.   Yes Historical Provider, MD  nortriptyline (PAMELOR) 10 MG capsule Take 20 mg by mouth at bedtime.   Yes Historical Provider, MD  omeprazole (PRILOSEC) 40 MG capsule Take 40 mg by mouth 2 (two) times daily.  09/07/13  Yes Historical Provider, MD  Tamsulosin HCl (FLOMAX) 0.4 MG CAPS Take 0.4 mg by mouth at bedtime.    Yes Historical Provider, MD  arformoterol (BROVANA) 15 MCG/2ML NEBU Take 2 mLs (15 mcg total) by nebulization 2 (two) times daily. Patient not taking: Reported on 09/15/2014 11/27/13    Elsie Stain, MD  HYDROcodone-acetaminophen (NORCO/VICODIN) 5-325 MG per tablet Take 0.5 tablets by mouth every 4 (four) hours as needed for moderate pain. Patient not taking: Reported on 09/15/2014 04/16/14   Orpah Greek, MD   BP 154/70 mmHg  Pulse 64  Temp(Src) 98 F (36.7 C) (Oral)  Resp 15  Ht 5\' 9"  (1.753 m)  Wt 160 lb (72.576 kg)  BMI 23.62 kg/m2  SpO2 99% Physical Exam  1230: Physical examination:  Nursing notes reviewed; Vital signs and O2 SAT reviewed;  Constitutional: Well developed, Well nourished, Well hydrated, In no acute distress; Head:  Normocephalic, atraumatic; Eyes: EOMI, PERRL, No scleral icterus; ENMT: TM's clear bilat. +edemetous nasal turbinates bilat with clear rhinorrhea. Mouth and pharynx without lesions. No tonsillar exudates. No intra-oral edema. No submandibular or sublingual edema. No hoarse voice, no drooling, no stridor. No pain with manipulation of larynx. No trismus. Mouth and pharynx normal, Mucous membranes moist; Neck: Supple, Full range of motion, No lymphadenopathy; Cardiovascular: Regular rate and rhythm, No gallop; Respiratory: Breath sounds clear & equal bilaterally, No wheezes.  Speaking full sentences with ease, Normal respiratory effort/excursion; Chest: +very tender to palp bilat parasternal areas which reproduces pt's pain. No rash, no deformity, no soft tissue crepitus. Movement normal; Abdomen: Soft, Nontender, Nondistended, Normal bowel sounds; Genitourinary: No CVA tenderness; Extremities: Pulses normal, No tenderness, No edema, No calf edema or asymmetry.; Neuro: Awake, alert, mildly confused per hx dementia. Major CN grossly intact.  Speech clear. No gross focal motor or sensory deficits in extremities.; Skin: Color normal, Warm, Dry.   ED Course  Procedures     EKG Interpretation   Date/Time:  Wednesday September 15 2014 11:21:01 EST Ventricular Rate:  77 PR Interval:  155 QRS Duration: 92 QT Interval:  402 QTC  Calculation: 455 R Axis:   48 Text Interpretation:  Sinus rhythm Consider left atrial enlargement  Baseline wander When compared with ECG of 04/16/2014 No significant change  was found Confirmed by [facility]  MD, Nunzio Cory [phone]) on 09/15/2014  12:42:05 PM      MDM  MDM Reviewed: previous chart, nursing note and vitals Reviewed previous: labs and ECG Interpretation: labs, ECG and x-ray      Results for orders placed or performed during the hospital encounter of 54/62/70  Basic metabolic panel  Result Value Ref Range   Sodium 137 135 - 145 mmol/L   Potassium 5.1 3.5 - 5.1 mmol/L   Chloride 109 96 - 112 mmol/L   CO2 27 19 - 32 mmol/L   Glucose, Bld 104 (H) 70 - 99 mg/dL   BUN 19 6 - 23 mg/dL   Creatinine, Ser 1.97 (H) 0.50 - 1.35 mg/dL   Calcium 8.8 8.4 - 10.5 mg/dL   GFR calc non Af Amer 30 (L) >90 mL/min   GFR calc Af Amer 35 (L) >90 mL/min   Anion gap NOT CALCULATED 5 - 15  Troponin I  Result Value Ref Range   Troponin I <0.03 <0.031 ng/mL  CBC with Differential  Result Value Ref Range   WBC 6.6 4.0 - 10.5 K/uL   RBC 3.48 (L) 4.22 - 5.81 MIL/uL   Hemoglobin 11.6 (L) 13.0 - 17.0 g/dL   HCT 34.6 (L) 39.0 - 52.0 %   MCV 99.4 78.0 - 100.0 fL   MCH 33.3 26.0 - 34.0 pg   MCHC 33.5 30.0 - 36.0 g/dL   RDW 14.1 11.5 - 15.5 %   Platelets 187 150 - 400 K/uL   Neutrophils Relative % 61 43 - 77 %   Neutro Abs 4.0 1.7 - 7.7 K/uL   Lymphocytes Relative 30 12 - 46 %   Lymphs Abs 2.0 0.7 - 4.0 K/uL   Monocytes Relative 7 3 - 12 %   Monocytes Absolute 0.5 0.1 - 1.0 K/uL   Eosinophils Relative 2 0 - 5 %   Eosinophils Absolute 0.2 0.0 - 0.7 K/uL   Basophils Relative 0 0 - 1 %   Basophils Absolute 0.0 0.0 - 0.1 K/uL  Dg Chest 2 View 09/15/2014   CLINICAL DATA:  81 year old male with chest pain for 1 week, increasing. Initial encounter. Personal history of former smoker.  EXAM: CHEST  2 VIEW  COMPARISON:  08/19/2014 and earlier.  FINDINGS: Stable lung volumes. Chronic increased  interstitial markings and right greater than left pulmonary architectural distortion. Stable cardiac size and mediastinal contours. Sequelae of CABG. No pneumothorax or pulmonary edema. No pleural effusion or acute pulmonary opacity. No acute osseous abnormality identified.  IMPRESSION: Chronic lung disease. No superimposed acute findings are identified.   Electronically Signed   By: Genevie Ann M.D.   On: 09/15/2014 12:34    1445:  Neb and steroid given with improvement. Sats 99% R/A, lungs CTA bilat, resps easy, appears NAD. States he feels better and wants to go home now. Doubt PE as cause for symptoms with low risk [place].  Doubt ACS as cause for symptoms with normal troponin and unchanged EKG from previous after 2 weeks of constant symptoms. BUN/Cr and H/H per baseline. Tx for COPD exacerbation and msk pain at this time. Dx and testing d/w pt and family.  Questions answered.  Verb understanding, agreeable to d/c home with outpt f/u.     Francine Graven, DO 09/19/14 1131

## 2014-09-17 ENCOUNTER — Ambulatory Visit: Payer: Commercial Managed Care - HMO | Admitting: Internal Medicine

## 2014-09-20 DIAGNOSIS — K219 Gastro-esophageal reflux disease without esophagitis: Secondary | ICD-10-CM | POA: Diagnosis not present

## 2014-09-20 DIAGNOSIS — R079 Chest pain, unspecified: Secondary | ICD-10-CM | POA: Diagnosis not present

## 2014-09-28 ENCOUNTER — Emergency Department (HOSPITAL_COMMUNITY)
Admission: EM | Admit: 2014-09-28 | Discharge: 2014-09-29 | Disposition: A | Discharge status: Home / Self Care | Payer: Commercial Managed Care - HMO | Attending: Emergency Medicine | Admitting: Emergency Medicine

## 2014-09-28 ENCOUNTER — Emergency Department (HOSPITAL_COMMUNITY): Payer: Commercial Managed Care - HMO

## 2014-09-28 DIAGNOSIS — I509 Heart failure, unspecified: Secondary | ICD-10-CM | POA: Diagnosis not present

## 2014-09-28 DIAGNOSIS — R05 Cough: Secondary | ICD-10-CM | POA: Diagnosis not present

## 2014-09-28 DIAGNOSIS — N400 Enlarged prostate without lower urinary tract symptoms: Secondary | ICD-10-CM | POA: Diagnosis not present

## 2014-09-28 DIAGNOSIS — J441 Chronic obstructive pulmonary disease with (acute) exacerbation: Secondary | ICD-10-CM | POA: Diagnosis not present

## 2014-09-28 DIAGNOSIS — Z7952 Long term (current) use of systemic steroids: Secondary | ICD-10-CM | POA: Diagnosis not present

## 2014-09-28 DIAGNOSIS — R0602 Shortness of breath: Secondary | ICD-10-CM | POA: Diagnosis not present

## 2014-09-28 DIAGNOSIS — R0789 Other chest pain: Secondary | ICD-10-CM | POA: Diagnosis not present

## 2014-09-28 DIAGNOSIS — Z9889 Other specified postprocedural states: Secondary | ICD-10-CM | POA: Diagnosis not present

## 2014-09-28 DIAGNOSIS — G309 Alzheimer's disease, unspecified: Secondary | ICD-10-CM | POA: Diagnosis not present

## 2014-09-28 DIAGNOSIS — N183 Chronic kidney disease, stage 3 (moderate): Secondary | ICD-10-CM | POA: Insufficient documentation

## 2014-09-28 DIAGNOSIS — I2510 Atherosclerotic heart disease of native coronary artery without angina pectoris: Secondary | ICD-10-CM | POA: Diagnosis not present

## 2014-09-28 DIAGNOSIS — E785 Hyperlipidemia, unspecified: Secondary | ICD-10-CM | POA: Insufficient documentation

## 2014-09-28 DIAGNOSIS — R079 Chest pain, unspecified: Secondary | ICD-10-CM | POA: Diagnosis not present

## 2014-09-28 DIAGNOSIS — Z7951 Long term (current) use of inhaled steroids: Secondary | ICD-10-CM | POA: Insufficient documentation

## 2014-09-28 DIAGNOSIS — Z862 Personal history of diseases of the blood and blood-forming organs and certain disorders involving the immune mechanism: Secondary | ICD-10-CM | POA: Diagnosis not present

## 2014-09-28 DIAGNOSIS — Z951 Presence of aortocoronary bypass graft: Secondary | ICD-10-CM | POA: Insufficient documentation

## 2014-09-28 DIAGNOSIS — F17210 Nicotine dependence, cigarettes, uncomplicated: Secondary | ICD-10-CM | POA: Diagnosis not present

## 2014-09-28 DIAGNOSIS — I129 Hypertensive chronic kidney disease with stage 1 through stage 4 chronic kidney disease, or unspecified chronic kidney disease: Secondary | ICD-10-CM | POA: Diagnosis not present

## 2014-09-28 DIAGNOSIS — Z87891 Personal history of nicotine dependence: Secondary | ICD-10-CM | POA: Insufficient documentation

## 2014-09-28 DIAGNOSIS — Z7982 Long term (current) use of aspirin: Secondary | ICD-10-CM | POA: Diagnosis not present

## 2014-09-28 DIAGNOSIS — F0280 Dementia in other diseases classified elsewhere without behavioral disturbance: Secondary | ICD-10-CM | POA: Diagnosis not present

## 2014-09-28 DIAGNOSIS — G8929 Other chronic pain: Secondary | ICD-10-CM

## 2014-09-28 DIAGNOSIS — I739 Peripheral vascular disease, unspecified: Secondary | ICD-10-CM | POA: Insufficient documentation

## 2014-09-28 DIAGNOSIS — K219 Gastro-esophageal reflux disease without esophagitis: Secondary | ICD-10-CM | POA: Insufficient documentation

## 2014-09-28 LAB — I-STAT TROPONIN, ED: TROPONIN I, POC: 0.01 ng/mL (ref 0.00–0.08)

## 2014-09-28 LAB — I-STAT CHEM 8, ED
BUN: 18 mg/dL (ref 6–23)
CHLORIDE: 107 mmol/L (ref 96–112)
Calcium, Ion: 1.14 mmol/L (ref 1.13–1.30)
Creatinine, Ser: 2 mg/dL — ABNORMAL HIGH (ref 0.50–1.35)
GLUCOSE: 90 mg/dL (ref 70–99)
HCT: 37 % — ABNORMAL LOW (ref 39.0–52.0)
HEMOGLOBIN: 12.6 g/dL — AB (ref 13.0–17.0)
POTASSIUM: 4.7 mmol/L (ref 3.5–5.1)
Sodium: 144 mmol/L (ref 135–145)
TCO2: 21 mmol/L (ref 0–100)

## 2014-09-28 LAB — CBC WITH DIFFERENTIAL/PLATELET
Basophils Absolute: 0 10*3/uL (ref 0.0–0.1)
Basophils Relative: 0 % (ref 0–1)
EOS PCT: 2 % (ref 0–5)
Eosinophils Absolute: 0.1 10*3/uL (ref 0.0–0.7)
HEMATOCRIT: 33.5 % — AB (ref 39.0–52.0)
Hemoglobin: 11.3 g/dL — ABNORMAL LOW (ref 13.0–17.0)
LYMPHS PCT: 42 % (ref 12–46)
Lymphs Abs: 2.6 10*3/uL (ref 0.7–4.0)
MCH: 33 pg (ref 26.0–34.0)
MCHC: 33.7 g/dL (ref 30.0–36.0)
MCV: 98 fL (ref 78.0–100.0)
MONO ABS: 0.5 10*3/uL (ref 0.1–1.0)
Monocytes Relative: 7 % (ref 3–12)
NEUTROS ABS: 2.9 10*3/uL (ref 1.7–7.7)
Neutrophils Relative %: 48 % (ref 43–77)
Platelets: 175 10*3/uL (ref 150–400)
RBC: 3.42 MIL/uL — ABNORMAL LOW (ref 4.22–5.81)
RDW: 14.2 % (ref 11.5–15.5)
WBC: 6.1 10*3/uL (ref 4.0–10.5)

## 2014-09-28 MED ORDER — HYDROCOD POLST-CHLORPHEN POLST 10-8 MG/5ML PO LQCR
5.0000 mL | Freq: Two times a day (BID) | ORAL | Status: DC | PRN
  Administered 2014-09-28: 5 mL via ORAL
  Filled 2014-09-28: qty 5

## 2014-09-28 MED ORDER — IPRATROPIUM-ALBUTEROL 0.5-2.5 (3) MG/3ML IN SOLN
3.0000 mL | Freq: Once | RESPIRATORY_TRACT | Status: AC
  Administered 2014-09-28: 3 mL via RESPIRATORY_TRACT
  Filled 2014-09-28: qty 3

## 2014-09-28 NOTE — ED Notes (Signed)
Dr Otter at bedside  

## 2014-09-28 NOTE — ED Notes (Signed)
Pt c/o pain across chest, st's he has had this pain x'3-4 months.  Pt st's he had a cabbage  approx 20 years ago and chest is always sore.  Pt went to [facility] in Clinic tonight and after receiving CXR was told to come to ED ref. CHF

## 2014-09-28 NOTE — ED Provider Notes (Signed)
CSN: [id number]     Arrival date & time 09/28/14  2158 History  This chart was scribed for Linton Flemings, MD by Delphia Grates, ED Scribe. This patient was seen in room D36C/D36C and the patient's care was started at 11:11 PM.   Chief Complaint  Patient presents with  . Chest Pain    The history is provided by the patient and a relative. No language interpreter was used.     HPI Comments: [name] [name] is a 81 y.o. male, with history of PVD, HTN, HLD, GERD, COPD, CKD, CAD, Alzheimer disease, and dementia who presents to the Emergency Department complaining of intermittent, worsening chest pain that has been ongoing for the past 3 months. Patient received CXR tonight at [facility] and was sent here to be further evaluated for possible CHF. There is associated congestion, productive cough, and SOB. Patient has past surgical history of CABG approximately  20 years ago, stent placement 9 years ago (2007). Per family, patient is scheduled for to have a stress test next week.   Cardiologist: Fransico Him   Past Medical History  Diagnosis Date  . PVD (peripheral vascular disease)     s/p left renal artery stent and left iliac stent  . Hypertension   . Hyperlipemia   . GERD (gastroesophageal reflux disease)   . COPD (chronic obstructive pulmonary disease)   . Monoclonal gammopathy   . BPH (benign prostatic hyperplasia)   . DEMENTIA   . Alzheimer disease   . Memory loss   . CAD (coronary artery disease)     s/p CABG in 1997 with known atretic LIMA to LAD and 40% LM.    Marland Kitchen Chronic kidney disease (CKD), stage III (moderate)   . Carotid artery stenosis     s/p left CEA  . Chronic chest wall pain    Past Surgical History  Procedure Laterality Date  . Coronary artery bypass graft  1997  . Left cea  2002  . Back surgery  2011  . Cataract extraction w/phaco  09/18/2011    Procedure: CATARACT EXTRACTION PHACO AND INTRAOCULAR LENS PLACEMENT (IOC);  Surgeon: Elta Guadeloupe T. Gershon Crane, MD;  Location: [facility];   Service: Ophthalmology;  Laterality: Right;  CDE: 6.19  . Cardiac surgery     Family History  Problem Relation Age of Onset  . Anesthesia problems Neg Hx   . Hypotension Neg Hx   . Malignant hyperthermia Neg Hx   . Pseudochol deficiency Neg Hx   . High blood pressure    . High Cholesterol    . Hypertension Father    History  Substance Use Topics  . Smoking status: Former Smoker -- 0.50 packs/day for 40 years    Types: Cigarettes    Quit date: 07/23/1978  . Smokeless tobacco: Never Used  . Alcohol Use: No    Review of Systems  HENT: Positive for congestion.   Respiratory: Positive for cough and shortness of breath.   Cardiovascular: Positive for chest pain.  All other systems reviewed and are negative.     Allergies  Review of patient's allergies indicates no known allergies.  Home Medications   Prior to Admission medications   Medication Sig Start Date End Date Taking? Authorizing Provider  acetaminophen (TYLENOL) 500 MG tablet Take 1,000 mg by mouth every 6 (six) hours as needed for headache.    Historical Provider, MD  albuterol (PROVENTIL HFA;VENTOLIN HFA) 108 (90 BASE) MCG/ACT inhaler Inhale 2 puffs into the lungs every 4 (four) hours as  needed for wheezing or shortness of breath.    Historical Provider, MD  albuterol (PROVENTIL HFA;VENTOLIN HFA) 108 (90 BASE) MCG/ACT inhaler Inhale 2 puffs into the lungs every 4 (four) hours as needed for wheezing or shortness of breath. 09/15/14   Francine Graven, DO  albuterol (PROVENTIL) (2.5 MG/3ML) 0.083% nebulizer solution Take 1 vial (87mL) 3-4 times per day. Patient taking differently: Take 2.5 mg by nebulization every 6 (six) hours as needed for wheezing or shortness of breath. Take 1 vial (24mL) 3-4 times per day. 12/28/13   Elsie Stain, MD  ALPRAZolam Duanne Moron) 0.5 MG tablet Take 1 tablet (0.5 mg total) by mouth daily. If needed Patient taking differently: Take 0.5 mg by mouth daily.  07/21/14   Marcial Pacas, MD  arformoterol  (BROVANA) 15 MCG/2ML NEBU Take 2 mLs (15 mcg total) by nebulization 2 (two) times daily. Patient not taking: Reported on 09/15/2014 11/27/13   Elsie Stain, MD  aspirin 325 MG tablet Take 325 mg by mouth daily.      Historical Provider, MD  azelastine (ASTELIN) 0.1 % nasal spray Place 1-2 sprays into both nostrils 2 (two) times daily. Use in each nostril as directed    Historical Provider, MD  bisacodyl (DULCOLAX) 5 MG EC tablet Take 5 mg by mouth daily as needed for moderate constipation.    Historical Provider, MD  calcitRIOL (ROCALTROL) 0.5 MCG capsule Take 0.5 mcg by mouth daily. 07/29/14   Historical Provider, MD  Cholecalciferol (VITAMIN D PO) Take 1 tablet by mouth daily.    Historical Provider, MD  donepezil (ARICEPT) 10 MG tablet Take 1 tablet (10 mg total) by mouth at bedtime. 09/02/13   Marcial Pacas, MD  doxycycline (VIBRA-TABS) 100 MG tablet Take 1 tablet (100 mg total) by mouth 2 (two) times daily. 09/15/14   Francine Graven, DO  finasteride (PROSCAR) 5 MG tablet Take 5 mg by mouth daily.     Historical Provider, MD  fluticasone (FLONASE) 50 MCG/ACT nasal spray Place 2 sprays into both nostrils daily.    Historical Provider, MD  gabapentin (NEURONTIN) 300 MG capsule Take 1 capsule (300 mg total) by mouth 3 (three) times daily. Patient taking differently: Take 300 mg by mouth daily.  01/25/14   Marcial Pacas, MD  HYDROcodone-acetaminophen (NORCO/VICODIN) 5-325 MG per tablet ONE HALF tab PO q6 hours prn pain 09/15/14   Francine Graven, DO  latanoprost (XALATAN) 0.005 % ophthalmic solution Place 1 drop into both eyes at bedtime.  12/28/13   Historical Provider, MD  Memantine HCl ER (NAMENDA XR) 28 MG CP24 TAKE ONE CAPSULE BY MOUTH ONCE DAILY. Patient taking differently: Take 1 capsule by mouth daily.  07/13/14   Marcial Pacas, MD  metoprolol succinate (TOPROL XL) 25 MG 24 hr tablet Take 1 tablet (25 mg total) by mouth daily. 08/27/14   Sueanne Margarita, MD  mirtazapine (REMERON) 15 MG tablet Take 30 mg by  mouth at bedtime.     Historical Provider, MD  Multiple Vitamin (MULTIVITAMIN WITH MINERALS) TABS tablet Take 1 tablet by mouth daily.    Historical Provider, MD  Naphazoline-Glycerin (CLEAR EYES REDNESS RELIEF OP) Apply 1 drop to eye daily.    Historical Provider, MD  nortriptyline (PAMELOR) 10 MG capsule Take 20 mg by mouth at bedtime.    Historical Provider, MD  omeprazole (PRILOSEC) 40 MG capsule Take 40 mg by mouth 2 (two) times daily.  09/07/13   Historical Provider, MD  predniSONE (DELTASONE) 20 MG tablet Take 2 tablets (  40 mg total) by mouth daily. 09/15/14   Francine Graven, DO  Tamsulosin HCl (FLOMAX) 0.4 MG CAPS Take 0.4 mg by mouth at bedtime.     Historical Provider, MD   Triage Vitals: BP 158/56 mmHg  Pulse 57  Temp(Src) 98 F (36.7 C) (Oral)  Resp 20  Ht 5\' 7"  (1.702 m)  Wt 165 lb (74.844 kg)  BMI 25.84 kg/m2  SpO2 98%  Physical Exam  Constitutional: He is oriented to person, place, and time. He appears well-developed and well-nourished. No distress.  HENT:  Head: Normocephalic and atraumatic.  Nose: Nose normal.  Mouth/Throat: Oropharynx is clear and moist.  Eyes: Conjunctivae and EOM are normal. Pupils are equal, round, and reactive to light.  Neck: Normal range of motion. Neck supple. No JVD present. No tracheal deviation present. No thyromegaly present.  Cardiovascular: Normal rate, regular rhythm, normal heart sounds and intact distal pulses.  Exam reveals no gallop and no friction rub.   No murmur heard. Pulmonary/Chest: Effort normal. No stridor. No respiratory distress. He has wheezes. He has no rales. He exhibits tenderness.  expiratory wheeze. Extremely tender to palpation over entire chest.  Abdominal: Soft. Bowel sounds are normal. He exhibits no distension and no mass. There is no tenderness. There is no rebound and no guarding.  Musculoskeletal: Normal range of motion. He exhibits no edema or tenderness.  Lymphadenopathy:    He has no cervical adenopathy.   Neurological: He is alert and oriented to person, place, and time. He displays normal reflexes. He exhibits normal muscle tone. Coordination normal.  Skin: Skin is warm and dry. No rash noted. No erythema. No pallor.  Psychiatric: He has a normal mood and affect. His behavior is normal. Judgment and thought content normal.  Nursing note and vitals reviewed.   ED Course  Procedures (including critical care time)  DIAGNOSTIC STUDIES: Oxygen Saturation is 98% on room air, normal by my interpretation.    COORDINATION OF CARE: At 2317 Discussed treatment plan with patient which includes breathing treatment and cough syrup. Patient agrees.   Labs Review Labs Reviewed  CBC WITH DIFFERENTIAL/PLATELET - Abnormal; Notable for the following:    RBC 3.42 (*)    Hemoglobin 11.3 (*)    HCT 33.5 (*)    All other components within normal limits  BRAIN NATRIURETIC PEPTIDE - Abnormal; Notable for the following:    B Natriuretic Peptide 491.7 (*)    All other components within normal limits  I-STAT CHEM 8, ED - Abnormal; Notable for the following:    Creatinine, Ser 2.00 (*)    Hemoglobin 12.6 (*)    HCT 37.0 (*)    All other components within normal limits  I-STAT TROPOININ, ED    Imaging Review Dg Chest 2 View  09/28/2014   CLINICAL DATA:  Mid chest pain and shortness of breath for 4 months.  EXAM: CHEST  2 VIEW  COMPARISON:  09/28/2014 from [facility]. Previous studies from 09/15/2014 and 12/10/2013  FINDINGS: Postoperative changes in the mediastinum. Normal heart size and pulmonary vascularity. Coarse infiltration in the lungs most likely to represent diffuse fibrosis. No significant change since previous study. No developing consolidation or airspace disease. No blunting of costophrenic angles. No pneumothorax.  IMPRESSION: Diffuse parenchymal fibrosis in the lungs. No change since prior study.   Electronically Signed   By: Lucienne Capers M.D.   On: 09/28/2014 23:06     EKG  Interpretation   Date/Time:  Tuesday September 28 2014 22:03:08  EST Ventricular Rate:  61 PR Interval:  140 QRS Duration: 88 QT Interval:  416 QTC Calculation: 418 R Axis:   47 Text Interpretation:  Normal sinus rhythm Normal ECG Confirmed by Marcellas Marchant   MD, Veanna Dower ([id number]) on 09/28/2014 11:05:47 PM      MDM   Final diagnoses:  Chronic chest wall pain  COPD exacerbation    81 year old male with chronic chest wall pain since having CABG 20 years ago, sent from urgent care clinic with concern for CHF.  Per wife, cough for the last 2 weeks history of COPD.  Chest x-ray here without signs of CHF.  Expiratory wheeze on exam.  Plan for DuoNeb and Tussionex.  Zithromax and follow-up with primary care doctor.  He has outpatient follow-up with cardiology scheduled for stress test and echo with carotid imaging as well.  EKG without ischemia.  Troponin negative.  I personally performed the services described in this documentation, which was scribed in my presence. The recorded information has been reviewed and is accurate.     Linton Flemings, MD 09/29/14 [phone]

## 2014-09-29 DIAGNOSIS — I129 Hypertensive chronic kidney disease with stage 1 through stage 4 chronic kidney disease, or unspecified chronic kidney disease: Secondary | ICD-10-CM | POA: Diagnosis not present

## 2014-09-29 DIAGNOSIS — F0280 Dementia in other diseases classified elsewhere without behavioral disturbance: Secondary | ICD-10-CM | POA: Diagnosis not present

## 2014-09-29 DIAGNOSIS — G8929 Other chronic pain: Secondary | ICD-10-CM | POA: Diagnosis not present

## 2014-09-29 DIAGNOSIS — R0789 Other chest pain: Secondary | ICD-10-CM | POA: Diagnosis not present

## 2014-09-29 DIAGNOSIS — G309 Alzheimer's disease, unspecified: Secondary | ICD-10-CM | POA: Diagnosis not present

## 2014-09-29 DIAGNOSIS — I739 Peripheral vascular disease, unspecified: Secondary | ICD-10-CM | POA: Diagnosis not present

## 2014-09-29 DIAGNOSIS — I2510 Atherosclerotic heart disease of native coronary artery without angina pectoris: Secondary | ICD-10-CM | POA: Diagnosis not present

## 2014-09-29 DIAGNOSIS — J441 Chronic obstructive pulmonary disease with (acute) exacerbation: Secondary | ICD-10-CM | POA: Diagnosis not present

## 2014-09-29 DIAGNOSIS — N183 Chronic kidney disease, stage 3 (moderate): Secondary | ICD-10-CM | POA: Diagnosis not present

## 2014-09-29 LAB — BRAIN NATRIURETIC PEPTIDE: B Natriuretic Peptide: 491.7 pg/mL — ABNORMAL HIGH (ref 0.0–100.0)

## 2014-09-29 MED ORDER — AZITHROMYCIN 250 MG PO TABS
500.0000 mg | ORAL_TABLET | Freq: Once | ORAL | Status: AC
  Administered 2014-09-29: 500 mg via ORAL
  Filled 2014-09-29: qty 2

## 2014-09-29 MED ORDER — AZITHROMYCIN 250 MG PO TABS: 250.0000 mg | ORAL_TABLET | Freq: Every day | ORAL | Status: DC

## 2014-09-29 MED ORDER — PREDNISONE 20 MG PO TABS: 40.0000 mg | ORAL_TABLET | Freq: Every day | ORAL | Status: DC

## 2014-09-29 MED ORDER — HYDROCOD POLST-CHLORPHEN POLST 10-8 MG/5ML PO LQCR: 5.0000 mL | Freq: Two times a day (BID) | ORAL | Status: DC | PRN

## 2014-09-29 MED ORDER — PREDNISONE 20 MG PO TABS
60.0000 mg | ORAL_TABLET | Freq: Once | ORAL | Status: AC
  Administered 2014-09-29: 60 mg via ORAL
  Filled 2014-09-29: qty 3

## 2014-09-29 NOTE — Discharge Instructions (Signed)
Chest Wall Pain Chest wall pain is pain in or around the bones and muscles of your chest. It may take up to 6 weeks to get better. It may take longer if you must stay physically active in your work and activities.  CAUSES  Chest wall pain may happen on its own. However, it may be caused by:  A viral illness like the flu.  Injury.  Coughing.  Exercise.  Arthritis.  Fibromyalgia.  Shingles. HOME CARE INSTRUCTIONS   Avoid overtiring physical activity. Try not to strain or perform activities that cause pain. This includes any activities using your chest or your abdominal and side muscles, especially if heavy weights are used.  Put ice on the sore area.  Put ice in a plastic bag.  Place a towel between your skin and the bag.  Leave the ice on for 15-20 minutes per hour while awake for the first 2 days.  Only take over-the-counter or prescription medicines for pain, discomfort, or fever as directed by your caregiver. SEEK IMMEDIATE MEDICAL CARE IF:   Your pain increases, or you are very uncomfortable.  You have a fever.  Your chest pain becomes worse.  You have new, unexplained symptoms.  You have nausea or vomiting.  You feel sweaty or lightheaded.  You have a cough with phlegm (sputum), or you cough up blood. MAKE SURE YOU:   Understand these instructions.  Will watch your condition.  Will get help right away if you are not doing well or get worse. Document Released: 07/09/2005 Document Revised: 10/01/2011 Document Reviewed: 03/05/2011 [facility] Patient Information 2015 [place], [place]. This information is not intended to replace advice given to you by your health care provider. Make sure you discuss any questions you have with your health care provider.  Chronic Obstructive Pulmonary Disease Exacerbation Chronic obstructive pulmonary disease (COPD) is a common lung condition in which airflow from the lungs is limited. COPD is a general term that can be used to  describe many different lung problems that limit airflow, including chronic bronchitis and emphysema. COPD exacerbations are episodes when breathing symptoms become much worse and require extra treatment. Without treatment, COPD exacerbations can be life threatening, and frequent COPD exacerbations can cause further damage to your lungs. CAUSES   Respiratory infections.   Exposure to smoke.   Exposure to air pollution, chemical fumes, or dust. Sometimes there is no apparent cause or trigger. RISK FACTORS  Smoking cigarettes.  Older age.  Frequent prior COPD exacerbations. SIGNS AND SYMPTOMS   Increased coughing.   Increased thick spit (sputum) production.   Increased wheezing.   Increased shortness of breath.   Rapid breathing.   Chest tightness. DIAGNOSIS  Your medical history, a physical exam, and tests will help your health care provider make a diagnosis. Tests may include:  A chest X-ray.  Basic lab tests.  Sputum testing.  An arterial blood gas test. TREATMENT  Depending on the severity of your COPD exacerbation, you may need to be admitted to a hospital for treatment. Some of the treatments commonly used to treat COPD exacerbations are:   Antibiotic medicines.   Bronchodilators. These are drugs that expand the air passages. They may be given with an inhaler or nebulizer. Spacer devices may be needed to help improve drug delivery.  Corticosteroid medicines.  Supplemental oxygen therapy.  HOME CARE INSTRUCTIONS   Do not smoke. Quitting smoking is very important to prevent COPD from getting worse and exacerbations from happening as often.  Avoid exposure to  all substances that irritate the airway, especially to tobacco smoke.   If you were prescribed an antibiotic medicine, finish it all even if you start to feel better.  Take all medicines as directed by your health care provider.It is important to use correct technique with inhaled  medicines.  Drink enough fluids to keep your urine clear or pale yellow (unless you have a medical condition that requires fluid restriction).  Use a cool mist vaporizer. This makes it easier to clear your chest when you cough.   If you have a home nebulizer and oxygen, continue to use them as directed.   Maintain all necessary vaccinations to prevent infections.   Exercise regularly.   Eat a healthy diet.   Keep all follow-up appointments as directed by your health care provider. SEEK IMMEDIATE MEDICAL CARE IF:  You have worsening shortness of breath.   You have trouble talking.   You have severe chest pain.  You have blood in your sputum.  You have a fever.  You have weakness, vomit repeatedly, or faint.   You feel confused.   You continue to get worse. MAKE SURE YOU:   Understand these instructions.  Will watch your condition.  Will get help right away if you are not doing well or get worse. Document Released: 05/06/2007 Document Revised: 11/23/2013 Document Reviewed: 03/13/2013 [facility] Patient Information 2015 [place], [place]. This information is not intended to replace advice given to you by your health care provider. Make sure you discuss any questions you have with your health care provider.  Chronic Pain Chronic pain can be defined as pain that is off and on and lasts for 3-6 months or longer. Many things cause chronic pain, which can make it difficult to make a diagnosis. There are many treatment options available for chronic pain. However, finding a treatment that works well for you may require trying various approaches until the right one is found. Many people benefit from a combination of two or more types of treatment to control their pain. SYMPTOMS  Chronic pain can occur anywhere in the body and can range from mild to very severe. Some types of chronic pain include:  Headache.  Low back pain.  Cancer pain.  Arthritis pain.  Neurogenic  pain. This is pain resulting from damage to nerves. People with chronic pain may also have other symptoms such as:  Depression.  Anger.  Insomnia.  Anxiety. DIAGNOSIS  Your health care provider will help diagnose your condition over time. In many cases, the initial focus will be on excluding possible conditions that could be causing the pain. Depending on your symptoms, your health care provider may order tests to diagnose your condition. Some of these tests may include:   Blood tests.   CT scan.   MRI.   X-rays.   Ultrasounds.   Nerve conduction studies.  You may need to see a specialist.  TREATMENT  Finding treatment that works well may take time. You may be referred to a pain specialist. He or she may prescribe medicine or therapies, such as:   Mindful meditation or yoga.  Shots (injections) of numbing or pain-relieving medicines into the spine or area of pain.  Local electrical stimulation.  Acupuncture.   Massage therapy.   Aroma, color, light, or sound therapy.   Biofeedback.   Working with a physical therapist to keep from getting stiff.   Regular, gentle exercise.   Cognitive or behavioral therapy.   Group support.  Sometimes, surgery may be  recommended.  HOME CARE INSTRUCTIONS   Take all medicines as directed by your health care provider.   Lessen stress in your life by relaxing and doing things such as listening to calming music.   Exercise or be active as directed by your health care provider.   Eat a healthy diet and include things such as vegetables, fruits, fish, and lean meats in your diet.   Keep all follow-up appointments with your health care provider.   Attend a support group with others suffering from chronic pain. SEEK MEDICAL CARE IF:   Your pain gets worse.   You develop a new pain that was not there before.   You cannot tolerate medicines given to you by your health care provider.   You have new  symptoms since your last visit with your health care provider.  SEEK IMMEDIATE MEDICAL CARE IF:   You feel weak.   You have decreased sensation or numbness.   You lose control of bowel or bladder function.   Your pain suddenly gets much worse.   You develop shaking.  You develop chills.  You develop confusion.  You develop chest pain.  You develop shortness of breath.  MAKE SURE YOU:  Understand these instructions.  Will watch your condition.  Will get help right away if you are not doing well or get worse. Document Released: 03/31/2002 Document Revised: 03/11/2013 Document Reviewed: 01/02/2013 [facility] Patient Information 2015 [place], [place]. This information is not intended to replace advice given to you by your health care provider. Make sure you discuss any questions you have with your health care provider.

## 2014-10-01 DIAGNOSIS — J9610 Chronic respiratory failure, unspecified whether with hypoxia or hypercapnia: Secondary | ICD-10-CM | POA: Diagnosis not present

## 2014-10-04 ENCOUNTER — Ambulatory Visit (HOSPITAL_COMMUNITY): Payer: Commercial Managed Care - HMO | Attending: Cardiology | Admitting: Cardiology

## 2014-10-04 ENCOUNTER — Ambulatory Visit (HOSPITAL_BASED_OUTPATIENT_CLINIC_OR_DEPARTMENT_OTHER): Payer: Commercial Managed Care - HMO | Admitting: Radiology

## 2014-10-04 ENCOUNTER — Ambulatory Visit (HOSPITAL_BASED_OUTPATIENT_CLINIC_OR_DEPARTMENT_OTHER): Payer: Commercial Managed Care - HMO | Admitting: Cardiology

## 2014-10-04 DIAGNOSIS — I2510 Atherosclerotic heart disease of native coronary artery without angina pectoris: Secondary | ICD-10-CM | POA: Insufficient documentation

## 2014-10-04 DIAGNOSIS — Z951 Presence of aortocoronary bypass graft: Secondary | ICD-10-CM | POA: Insufficient documentation

## 2014-10-04 DIAGNOSIS — R0602 Shortness of breath: Secondary | ICD-10-CM | POA: Diagnosis not present

## 2014-10-04 DIAGNOSIS — R0609 Other forms of dyspnea: Secondary | ICD-10-CM | POA: Insufficient documentation

## 2014-10-04 DIAGNOSIS — I6502 Occlusion and stenosis of left vertebral artery: Secondary | ICD-10-CM | POA: Diagnosis not present

## 2014-10-04 DIAGNOSIS — I10 Essential (primary) hypertension: Secondary | ICD-10-CM | POA: Insufficient documentation

## 2014-10-04 DIAGNOSIS — I739 Peripheral vascular disease, unspecified: Secondary | ICD-10-CM | POA: Diagnosis not present

## 2014-10-04 DIAGNOSIS — R079 Chest pain, unspecified: Secondary | ICD-10-CM | POA: Insufficient documentation

## 2014-10-04 DIAGNOSIS — R0600 Dyspnea, unspecified: Secondary | ICD-10-CM

## 2014-10-04 DIAGNOSIS — R55 Syncope and collapse: Secondary | ICD-10-CM | POA: Diagnosis not present

## 2014-10-04 DIAGNOSIS — I6523 Occlusion and stenosis of bilateral carotid arteries: Secondary | ICD-10-CM | POA: Diagnosis not present

## 2014-10-04 DIAGNOSIS — R413 Other amnesia: Secondary | ICD-10-CM

## 2014-10-04 DIAGNOSIS — I272 Pulmonary hypertension, unspecified: Secondary | ICD-10-CM

## 2014-10-04 MED ORDER — TECHNETIUM TC 99M SESTAMIBI GENERIC - CARDIOLITE
33.0000 | Freq: Once | INTRAVENOUS | Status: AC | PRN
  Administered 2014-10-04: 33 via INTRAVENOUS

## 2014-10-04 MED ORDER — REGADENOSON 0.4 MG/5ML IV SOLN
0.4000 mg | Freq: Once | INTRAVENOUS | Status: AC
  Administered 2014-10-04: 0.4 mg via INTRAVENOUS

## 2014-10-04 MED ORDER — TECHNETIUM TC 99M SESTAMIBI GENERIC - CARDIOLITE
11.0000 | Freq: Once | INTRAVENOUS | Status: AC | PRN
  Administered 2014-10-04: 11 via INTRAVENOUS

## 2014-10-04 NOTE — Progress Notes (Signed)
[facility] [address] [place], [place] [address] [phone]    Cardiology Nuclear Med Study  [name] [name] is a 81 y.o. male     MRN : [id number]     DOB: 1931-04-06  Procedure Date: 10/04/2014  Nuclear Med Background Indication for Stress Test:  Evaluation for Ischemia, Follow up CAD,  and Post Hospital:Chest Pain on 09/28/14 and (-) enzymes History: CABG, and 2013 Myocardial Perfusion Imaging-Normal, EF=51% Cardiac Risk Factors: Carotid Disease, Hypertension and PVD  Symptoms: Chronic Chest Pain with/without exertion present most of the time since CABG (sensitive to touch), DOE and Syncope   Nuclear Pre-Procedure Caffeine/Decaff Intake:  None NPO After: 5:30am   Lungs:  clear O2 Sat: 98% on room air. IV 0.9% NS with Angio Cath:  22g  IV Site: R Wrist  IV Started by:  Matilde Haymaker, RN  Chest Size (in):  42 Cup Size: n/a  Height: 5\' 7"  (1.702 m)  Weight:  165 lb (74.844 kg)  BMI:  Body mass index is 25.84 kg/(m^2). Tech Comments:  No Toprol x 24 hrs    Nuclear Med Study 1 or 2 day study: 1 day  Stress Test Type:  Lexiscan  Reading MD: n/a  Order Authorizing Provider:  Vonna Drafts   Resting Radionuclide: Technetium 56m Sestamibi  Resting Radionuclide Dose: 11.0 mCi   Stress Radionuclide:  Technetium 23m Sestamibi  Stress Radionuclide Dose: 33.0 mCi           Stress Protocol Rest HR: 66 Stress HR: 72  Rest BP: 154/92 Stress BP: 154/72  Exercise Time (min): n/a METS: n/a   Predicted Max HR: 139 bpm % Max HR: 51.8 bpm Rate Pressure Product: 11088   Dose of Adenosine (mg):  n/a Dose of Lexiscan: 0.4 mg  Dose of Atropine (mg): n/a Dose of Dobutamine: n/a mcg/kg/min (at max HR)  Stress Test Technologist: Irven Baltimore, RN  Nuclear Technologist:  Earl Many, CNMT     Rest Procedure:  Myocardial perfusion imaging was performed at rest 45 minutes following the intravenous administration of Technetium 71m Sestamibi. Rest  ECG: NSR - Normal EKG  Stress Procedure:  The patient received IV Lexiscan 0.4 mg over 15-seconds.  Technetium 78m Sestamibi injected at 30-seconds.  The patient complained of chest tightness with Lexiscan. Quantitative spect images were obtained after a 45 minute delay. Stress ECG: No significant change from baseline ECG  QPS Raw Data Images:  Normal; no motion artifact; normal heart/lung ratio. Stress Images:  There is a small area of moderate-severe attenuation of the mid lateral wall with normal uptake in all areas of the LV .   Rest Images:  There is a small area of moderate-severe attenuation of the mid lateral wall with normal uptake in all areas of the LV .  Subtraction (SDS):  No evidence of ischemia.   Transient Ischemic Dilatation (Normal <1.22):  0.90 Lung/Heart Ratio (Normal <0.45):  0.25  Quantitative Gated Spect Images QGS EDV:  106 ml QGS ESV:  49 ml  Impression Exercise Capacity:  Lexiscan with no exercise. BP Response:  Normal blood pressure response. Clinical Symptoms:  No significant symptoms noted. ECG Impression:  No significant ST segment change suggestive of ischemia. Comparison with Prior Nuclear Study: No images to compare  Overall Impression:  Low risk stress nuclear study .  There is no ischemia.  There is a smal fixed defect in the mid lateral wall that may be an artifact vs. a small prior  lateral MI.  .  LV Ejection Fraction: 53%.  LV Wall Motion:  NL LV Function; NL Wall Motion.   Thayer Headings, Brooke Bonito., MD, [facility] 10/04/2014, 4:51 PM [address],  [place] Pager [phone]

## 2014-10-04 NOTE — Progress Notes (Signed)
Echo performed. 

## 2014-10-04 NOTE — Progress Notes (Signed)
Carotid duplex performed 

## 2014-10-05 ENCOUNTER — Encounter: Payer: Self-pay | Admitting: Cardiology

## 2014-10-05 DIAGNOSIS — N401 Enlarged prostate with lower urinary tract symptoms: Secondary | ICD-10-CM | POA: Diagnosis not present

## 2014-10-05 DIAGNOSIS — N5201 Erectile dysfunction due to arterial insufficiency: Secondary | ICD-10-CM | POA: Diagnosis not present

## 2014-10-05 DIAGNOSIS — N138 Other obstructive and reflux uropathy: Secondary | ICD-10-CM | POA: Diagnosis not present

## 2014-10-07 ENCOUNTER — Telehealth: Payer: Self-pay

## 2014-10-07 ENCOUNTER — Telehealth: Payer: Self-pay | Admitting: *Deleted

## 2014-10-07 DIAGNOSIS — I6523 Occlusion and stenosis of bilateral carotid arteries: Secondary | ICD-10-CM

## 2014-10-07 DIAGNOSIS — I272 Pulmonary hypertension, unspecified: Secondary | ICD-10-CM

## 2014-10-07 MED ORDER — NITROGLYCERIN 0.4 MG SL SUBL: 0.4000 mg | SUBLINGUAL_TABLET | SUBLINGUAL | Status: DC | PRN

## 2014-10-07 NOTE — Telephone Encounter (Signed)
Informed patient of results and verbal understanding expressed.  Repeat carotids ordered to be scheduled in 6 months.

## 2014-10-07 NOTE — Telephone Encounter (Signed)
Refill for nitroglycerin SL 0.4 mg sent to pts pharmacy of choice. Pts wife informed of refill sent.

## 2014-10-07 NOTE — Telephone Encounter (Signed)
Informed patient of results and verbal understanding expressed.  Repeat ECHO ordered to be scheduled in 1 year. Patient agrees with treatment plan. 

## 2014-10-07 NOTE — Telephone Encounter (Signed)
Patients wife requested nitro refill for patient, but per history med list it was discontinued by provider on 07/29/14. Please advise. Thanks, MI

## 2014-10-07 NOTE — Telephone Encounter (Signed)
-----   Message from Sueanne Margarita, MD sent at 10/05/2014  4:31 PM EDT ----- Heavy plaque in the right bifurcation, and smooth plaque in bilateral mid CCA's.  60-79% ostial RICA stenosis.2-06% LICA stenosis, s/p CEA with DPA.  Patent vertebral arteries with antegrade flow, with >50% ostial stenosis of the left vertebral artery, and atypical flow.  Normal subclavian arteries, bilaterally.  Repeat study in 6 months

## 2014-10-07 NOTE — Telephone Encounter (Signed)
Ok to refill 

## 2014-10-07 NOTE — Telephone Encounter (Signed)
-----   Message from Sueanne Margarita, MD sent at 10/04/2014  9:57 PM EDT ----- Please let patient know that echo showed low normal LVF with mildly leaky AR/MR, moderate pulmonary HTN secondary to COPD - repeat echo in 1 year for pulmonary HTN

## 2014-10-14 ENCOUNTER — Emergency Department (HOSPITAL_COMMUNITY): Payer: Commercial Managed Care - HMO

## 2014-10-14 ENCOUNTER — Inpatient Hospital Stay (HOSPITAL_COMMUNITY)
Admission: EM | Admit: 2014-10-14 | Discharge: 2014-10-16 | DRG: 193 | Disposition: A | Discharge status: Home / Self Care | Payer: Commercial Managed Care - HMO | Attending: Internal Medicine | Admitting: Internal Medicine

## 2014-10-14 ENCOUNTER — Encounter (HOSPITAL_COMMUNITY): Payer: Self-pay | Admitting: Emergency Medicine

## 2014-10-14 DIAGNOSIS — I129 Hypertensive chronic kidney disease with stage 1 through stage 4 chronic kidney disease, or unspecified chronic kidney disease: Secondary | ICD-10-CM | POA: Diagnosis not present

## 2014-10-14 DIAGNOSIS — E875 Hyperkalemia: Secondary | ICD-10-CM | POA: Diagnosis present

## 2014-10-14 DIAGNOSIS — J441 Chronic obstructive pulmonary disease with (acute) exacerbation: Secondary | ICD-10-CM | POA: Diagnosis not present

## 2014-10-14 DIAGNOSIS — N400 Enlarged prostate without lower urinary tract symptoms: Secondary | ICD-10-CM | POA: Diagnosis not present

## 2014-10-14 DIAGNOSIS — Z951 Presence of aortocoronary bypass graft: Secondary | ICD-10-CM | POA: Diagnosis not present

## 2014-10-14 DIAGNOSIS — J189 Pneumonia, unspecified organism: Secondary | ICD-10-CM | POA: Diagnosis not present

## 2014-10-14 DIAGNOSIS — R4182 Altered mental status, unspecified: Secondary | ICD-10-CM | POA: Diagnosis not present

## 2014-10-14 DIAGNOSIS — Z87891 Personal history of nicotine dependence: Secondary | ICD-10-CM

## 2014-10-14 DIAGNOSIS — J811 Chronic pulmonary edema: Secondary | ICD-10-CM | POA: Diagnosis not present

## 2014-10-14 DIAGNOSIS — F0280 Dementia in other diseases classified elsewhere without behavioral disturbance: Secondary | ICD-10-CM | POA: Diagnosis present

## 2014-10-14 DIAGNOSIS — G309 Alzheimer's disease, unspecified: Secondary | ICD-10-CM | POA: Diagnosis not present

## 2014-10-14 DIAGNOSIS — N179 Acute kidney failure, unspecified: Secondary | ICD-10-CM | POA: Diagnosis not present

## 2014-10-14 DIAGNOSIS — Z23 Encounter for immunization: Secondary | ICD-10-CM | POA: Diagnosis not present

## 2014-10-14 DIAGNOSIS — K219 Gastro-esophageal reflux disease without esophagitis: Secondary | ICD-10-CM | POA: Diagnosis present

## 2014-10-14 DIAGNOSIS — R410 Disorientation, unspecified: Secondary | ICD-10-CM | POA: Diagnosis present

## 2014-10-14 DIAGNOSIS — Z7982 Long term (current) use of aspirin: Secondary | ICD-10-CM | POA: Diagnosis not present

## 2014-10-14 DIAGNOSIS — Z8249 Family history of ischemic heart disease and other diseases of the circulatory system: Secondary | ICD-10-CM

## 2014-10-14 DIAGNOSIS — E46 Unspecified protein-calorie malnutrition: Secondary | ICD-10-CM | POA: Diagnosis not present

## 2014-10-14 DIAGNOSIS — D472 Monoclonal gammopathy: Secondary | ICD-10-CM | POA: Diagnosis present

## 2014-10-14 DIAGNOSIS — I2510 Atherosclerotic heart disease of native coronary artery without angina pectoris: Secondary | ICD-10-CM | POA: Diagnosis present

## 2014-10-14 DIAGNOSIS — E785 Hyperlipidemia, unspecified: Secondary | ICD-10-CM | POA: Diagnosis present

## 2014-10-14 DIAGNOSIS — Z7951 Long term (current) use of inhaled steroids: Secondary | ICD-10-CM | POA: Diagnosis not present

## 2014-10-14 DIAGNOSIS — N183 Chronic kidney disease, stage 3 (moderate): Secondary | ICD-10-CM | POA: Diagnosis present

## 2014-10-14 DIAGNOSIS — G9340 Encephalopathy, unspecified: Secondary | ICD-10-CM | POA: Diagnosis present

## 2014-10-14 DIAGNOSIS — R443 Hallucinations, unspecified: Secondary | ICD-10-CM | POA: Diagnosis not present

## 2014-10-14 DIAGNOSIS — J309 Allergic rhinitis, unspecified: Secondary | ICD-10-CM | POA: Diagnosis present

## 2014-10-14 LAB — CBC WITH DIFFERENTIAL/PLATELET
BASOS ABS: 0 10*3/uL (ref 0.0–0.1)
Basophils Relative: 0 % (ref 0–1)
EOS PCT: 3 % (ref 0–5)
Eosinophils Absolute: 0.2 10*3/uL (ref 0.0–0.7)
HCT: 34.3 % — ABNORMAL LOW (ref 39.0–52.0)
Hemoglobin: 11.6 g/dL — ABNORMAL LOW (ref 13.0–17.0)
LYMPHS PCT: 44 % (ref 12–46)
Lymphs Abs: 3.2 10*3/uL (ref 0.7–4.0)
MCH: 34 pg (ref 26.0–34.0)
MCHC: 33.8 g/dL (ref 30.0–36.0)
MCV: 100.6 fL — ABNORMAL HIGH (ref 78.0–100.0)
MONO ABS: 0.6 10*3/uL (ref 0.1–1.0)
Monocytes Relative: 8 % (ref 3–12)
Neutro Abs: 3.2 10*3/uL (ref 1.7–7.7)
Neutrophils Relative %: 45 % (ref 43–77)
PLATELETS: 203 10*3/uL (ref 150–400)
RBC: 3.41 MIL/uL — AB (ref 4.22–5.81)
RDW: 14.7 % (ref 11.5–15.5)
WBC: 7.2 10*3/uL (ref 4.0–10.5)

## 2014-10-14 LAB — COMPREHENSIVE METABOLIC PANEL
ALBUMIN: 3.4 g/dL — AB (ref 3.5–5.2)
ALT: 15 U/L (ref 0–53)
AST: 21 U/L (ref 0–37)
Alkaline Phosphatase: 56 U/L (ref 39–117)
Anion gap: 6 (ref 5–15)
BUN: 18 mg/dL (ref 6–23)
CALCIUM: 9 mg/dL (ref 8.4–10.5)
CO2: 27 mmol/L (ref 19–32)
Chloride: 107 mmol/L (ref 96–112)
Creatinine, Ser: 2.42 mg/dL — ABNORMAL HIGH (ref 0.50–1.35)
GFR calc Af Amer: 27 mL/min — ABNORMAL LOW (ref >90)
GFR, EST NON AFRICAN AMERICAN: 24 mL/min — AB (ref >90)
GLUCOSE: 96 mg/dL (ref 70–99)
Potassium: 4.6 mmol/L (ref 3.5–5.1)
Sodium: 140 mmol/L (ref 135–145)
Total Bilirubin: 0.3 mg/dL (ref 0.3–1.2)
Total Protein: 7 g/dL (ref 6.0–8.3)

## 2014-10-14 LAB — URINALYSIS, ROUTINE W REFLEX MICROSCOPIC
Bilirubin Urine: NEGATIVE
Glucose, UA: NEGATIVE mg/dL
HGB URINE DIPSTICK: NEGATIVE
Ketones, ur: NEGATIVE mg/dL
Leukocytes, UA: NEGATIVE
NITRITE: NEGATIVE
Protein, ur: NEGATIVE mg/dL
Specific Gravity, Urine: 1.015 (ref 1.005–1.030)
UROBILINOGEN UA: 0.2 mg/dL (ref 0.0–1.0)
pH: 6 (ref 5.0–8.0)

## 2014-10-14 LAB — TROPONIN I: Troponin I: <0.03 ng/mL (ref <0.031)

## 2014-10-14 MED ORDER — NALOXONE HCL 1 MG/ML IJ SOLN
1.0000 mg | Freq: Once | INTRAMUSCULAR | Status: AC
  Administered 2014-10-14: 1 mg via INTRAVENOUS

## 2014-10-14 MED ORDER — SODIUM CHLORIDE 0.9 % IV SOLN
INTRAVENOUS | Status: AC
  Administered 2014-10-14: via INTRAVENOUS

## 2014-10-14 NOTE — ED Provider Notes (Signed)
This chart was scribed for McCloud, DO by Delphia Grates, ED Scribe. This patient was seen in room APA09/APA09 and the patient's care was started at 11:14 PM..  TIME SEEN: 2314  CHIEF COMPLAINT: Hallucinations  HPI:   HPI Comments: [name] [name] is a 81 y.o. male, with history of PVD, HTN, HLD, GERD, COPD, CKD, CAD, Alzheimer's dementia, who presents to the Emergency Department complaining of hallucinations, altered all status this past week. Wife states the patient has been "talking of his head" and reports that she has a difficult time waking the patient. Wife reports a recent addition of blood pressure medication which she believes is Toprol.  No other new medications. Wife denies falls or head injury. Patient was seen at [facility] approximately 2 weeks ago for COPD exacerbation and recently finished course of Prednisone and she reports antibiotics with improvement of cough. She denies nausea, vomiting, diarrhea, or urinary symptoms. She reports patient has had increase of his cough over the past several days but no known fever.  PCP: Gara Kroner, MD   ROS: Level V caveat for altered mental status  PAST MEDICAL HISTORY/PAST SURGICAL HISTORY:  Past Medical History  Diagnosis Date  . PVD (peripheral vascular disease)     s/p left renal artery stent and left iliac stent  . Hypertension   . Hyperlipemia   . GERD (gastroesophageal reflux disease)   . COPD (chronic obstructive pulmonary disease)   . Monoclonal gammopathy   . BPH (benign prostatic hyperplasia)   . DEMENTIA   . Alzheimer disease   . Memory loss   . CAD (coronary artery disease)     s/p CABG in 1997 with known atretic LIMA to LAD and 40% LM.    Marland Kitchen Chronic kidney disease (CKD), stage III (moderate)   . Carotid artery stenosis     s/p left CEA.  Carotid dopplers 09/2013 60-79% right ICA stenosis and 1-39% left ICA stenosis of left CEA  . Chronic chest wall pain     MEDICATIONS:  Prior to Admission medications    Medication Sig Start Date End Date Taking? Authorizing Provider  acetaminophen (TYLENOL) 500 MG tablet Take 1,000 mg by mouth every 6 (six) hours as needed for headache.   Yes Historical Provider, MD  albuterol (PROVENTIL HFA;VENTOLIN HFA) 108 (90 BASE) MCG/ACT inhaler Inhale 2 puffs into the lungs every 4 (four) hours as needed for wheezing or shortness of breath. 09/15/14  Yes Francine Graven, DO  albuterol (PROVENTIL) (2.5 MG/3ML) 0.083% nebulizer solution Take 1 vial (35mL) 3-4 times per day. Patient taking differently: Take 2.5 mg by nebulization 2 (two) times daily. Take 1 vial (32mL) 3-4 times per day. 12/28/13  Yes Elsie Stain, MD  ALPRAZolam Duanne Moron) 0.5 MG tablet Take 1 tablet (0.5 mg total) by mouth daily. If needed Patient taking differently: Take 0.5 mg by mouth daily.  07/21/14  Yes Marcial Pacas, MD  aspirin 325 MG tablet Take 325 mg by mouth daily.     Yes Historical Provider, MD  azelastine (ASTELIN) 0.1 % nasal spray Place 1-2 sprays into both nostrils 2 (two) times daily. Use in each nostril as directed   Yes Historical Provider, MD  bisacodyl (DULCOLAX) 5 MG EC tablet Take 5 mg by mouth daily as needed for moderate constipation.   Yes Historical Provider, MD  calcitRIOL (ROCALTROL) 0.5 MCG capsule Take 0.5 mcg by mouth daily. 07/29/14  Yes Historical Provider, MD  Cholecalciferol (VITAMIN D PO) Take 1 tablet by mouth daily.  Yes Historical Provider, MD  donepezil (ARICEPT) 10 MG tablet Take 1 tablet (10 mg total) by mouth at bedtime. 09/02/13  Yes Marcial Pacas, MD  finasteride (PROSCAR) 5 MG tablet Take 5 mg by mouth daily.    Yes Historical Provider, MD  fluticasone (FLONASE) 50 MCG/ACT nasal spray Place 2 sprays into both nostrils daily.   Yes Historical Provider, MD  gabapentin (NEURONTIN) 300 MG capsule Take 1 capsule (300 mg total) by mouth 3 (three) times daily. Patient taking differently: Take 300 mg by mouth daily.  01/25/14  Yes Marcial Pacas, MD  latanoprost (XALATAN) 0.005 %  ophthalmic solution Place 1 drop into both eyes at bedtime.  12/28/13  Yes Historical Provider, MD  Memantine HCl ER (NAMENDA XR) 28 MG CP24 TAKE ONE CAPSULE BY MOUTH ONCE DAILY. Patient taking differently: Take 1 capsule by mouth daily.  07/13/14  Yes Marcial Pacas, MD  metoprolol succinate (TOPROL XL) 25 MG 24 hr tablet Take 1 tablet (25 mg total) by mouth daily. 08/27/14  Yes Sueanne Margarita, MD  mirtazapine (REMERON) 15 MG tablet Take 30 mg by mouth at bedtime.    Yes Historical Provider, MD  Multiple Vitamin (MULTIVITAMIN WITH MINERALS) TABS tablet Take 1 tablet by mouth daily.   Yes Historical Provider, MD  Naphazoline-Glycerin (CLEAR EYES REDNESS RELIEF OP) Apply 1 drop to eye daily.   Yes Historical Provider, MD  nitroGLYCERIN (NITROSTAT) 0.4 MG SL tablet Place 1 tablet (0.4 mg total) under the tongue every 5 (five) minutes as needed for chest pain. 10/07/14  Yes Sueanne Margarita, MD  nortriptyline (PAMELOR) 10 MG capsule Take 20 mg by mouth at bedtime.   Yes Historical Provider, MD  omeprazole (PRILOSEC) 40 MG capsule Take 40 mg by mouth every evening.  09/07/13  Yes Historical Provider, MD  Tamsulosin HCl (FLOMAX) 0.4 MG CAPS Take 0.4 mg by mouth at bedtime.    Yes Historical Provider, MD  azithromycin (ZITHROMAX) 250 MG tablet Take 1 tablet (250 mg total) by mouth daily. Patient not taking: Reported on 10/14/2014 09/29/14   Linton Flemings, MD  chlorpheniramine-HYDROcodone (TUSSIONEX) 10-8 MG/5ML LQCR Take 5 mLs by mouth every 12 (twelve) hours as needed for cough (chest pain). Patient not taking: Reported on 10/14/2014 09/29/14   Linton Flemings, MD  doxycycline (VIBRA-TABS) 100 MG tablet Take 1 tablet (100 mg total) by mouth 2 (two) times daily. Patient not taking: Reported on 10/14/2014 09/15/14   Francine Graven, DO  HYDROcodone-acetaminophen (NORCO/VICODIN) 5-325 MG per tablet ONE HALF tab PO q6 hours prn pain Patient not taking: Reported on 10/14/2014 09/15/14   Francine Graven, DO  lansoprazole (PREVACID) 30  MG capsule Take 30 mg by mouth daily. 09/22/14   Historical Provider, MD  predniSONE (DELTASONE) 20 MG tablet Take 2 tablets (40 mg total) by mouth daily. Patient not taking: Reported on 10/14/2014 09/29/14   Linton Flemings, MD    ALLERGIES:  No Known Allergies  SOCIAL HISTORY:  History  Substance Use Topics  . Smoking status: Former Smoker -- 0.50 packs/day for 40 years    Types: Cigarettes    Quit date: 07/23/1978  . Smokeless tobacco: Never Used  . Alcohol Use: No    FAMILY HISTORY: Family History  Problem Relation Age of Onset  . Anesthesia problems Neg Hx   . Hypotension Neg Hx   . Malignant hyperthermia Neg Hx   . Pseudochol deficiency Neg Hx   . High blood pressure    . High Cholesterol    .  Hypertension Father     EXAM:  Triage Vitals: BP 117/68 mmHg  Pulse 57  Temp(Src) 97.6 F (36.4 C) (Oral)  Resp 18  Ht 5\' 7"  (1.702 m)  Wt 165 lb (74.844 kg)  BMI 25.84 kg/m2  SpO2 98%  CONSTITUTIONAL: Patient is sleeping but is minimally arousable, he will move all his extremities and will speak intermittently but falls quickly back to sleep, protecting airway, does not follow commands or answer questions HEAD: Normocephalic EYES: Conjunctivae clear, PERRL ENT: normal nose; no rhinorrhea; moist mucous membranes; pharynx without lesions noted NECK: Supple, no meningismus, no LAD  CARD: RRR; S1 and S2 appreciated; no murmurs, no clicks, no rubs, no gallops RESP: Normal chest excursion without splinting or tachypnea; breath sounds clear and equal bilaterally; no wheezes, no rhonchi, no rales, no hypoxia or respiratory distress ABD/GI: Normal bowel sounds; non-distended; soft, non-tender, no rebound, no guarding BACK:  The back appears normal and is non-tender to palpation, there is no CVA tenderness EXT: Normal ROM in all joints; non-tender to palpation; no edema; normal capillary refill; no cyanosis    SKIN: Normal color for age and race; warm NEURO: Moves all extremities  equally, appears very drowsy and is difficult to arouse but protecting airway   MEDICAL DECISION MAKING: Patient here with altered mental status. Wife reports that he has not been eating or drinking well, difficult to wake up and has been hallucinating. We'll obtain altered mental status workup including labs, urine, chest x-ray, head CT. He does appear to have recently been on hydrocodone. We'll give Narcan. Will check CBG.  ED PROGRESS: No improvement with Narcan. Patient's labs are unremarkable except for chronic kidney disease which is stable. Urine shows no sign of infection. Chest x-ray shows possible pneumonia. He is not febrile and has no leukocytosis but wife does report increasing productive cough. No recent admission to the hospital. Will treat for community-acquired pneumonia. Discussed with Dr. Alcario Drought for admission to medical bed, observation given patient is still unable to wake up, drank, ambulate and wife does not feel that he is safe to go home with her if she cannot care for him. She reports he is normally able to accomplish his ADLs, ambulate, carry a conversation.      EKG Interpretation  Date/Time:  Friday October 15 2014 00:05:24 EDT Ventricular Rate:  51 PR Interval:  149 QRS Duration: 87 QT Interval:  476 QTC Calculation: 438 R Axis:   24 Text Interpretation:  Sinus rhythm Low voltage, precordial leads Baseline wander in lead(s) V3 No significant change since last tracing Confirmed by WARD,  DO, KRISTEN ([id number]) on 10/15/2014 12:14:28 AM       I personally performed the services described in this documentation, which was scribed in my presence. The recorded information has been reviewed and is accurate.    [place], DO 10/15/14 [id number]

## 2014-10-14 NOTE — ED Notes (Signed)
Wife states patient has been talking out of his head.  Wife states patient has prostate problems and dementia.  Patient sleeping during triage.

## 2014-10-15 DIAGNOSIS — N183 Chronic kidney disease, stage 3 unspecified: Secondary | ICD-10-CM | POA: Diagnosis present

## 2014-10-15 DIAGNOSIS — G9340 Encephalopathy, unspecified: Secondary | ICD-10-CM | POA: Diagnosis present

## 2014-10-15 DIAGNOSIS — N179 Acute kidney failure, unspecified: Secondary | ICD-10-CM | POA: Diagnosis present

## 2014-10-15 DIAGNOSIS — Z8249 Family history of ischemic heart disease and other diseases of the circulatory system: Secondary | ICD-10-CM | POA: Diagnosis not present

## 2014-10-15 DIAGNOSIS — I2510 Atherosclerotic heart disease of native coronary artery without angina pectoris: Secondary | ICD-10-CM | POA: Diagnosis present

## 2014-10-15 DIAGNOSIS — K219 Gastro-esophageal reflux disease without esophagitis: Secondary | ICD-10-CM | POA: Diagnosis present

## 2014-10-15 DIAGNOSIS — I129 Hypertensive chronic kidney disease with stage 1 through stage 4 chronic kidney disease, or unspecified chronic kidney disease: Secondary | ICD-10-CM | POA: Diagnosis present

## 2014-10-15 DIAGNOSIS — R410 Disorientation, unspecified: Secondary | ICD-10-CM

## 2014-10-15 DIAGNOSIS — R4182 Altered mental status, unspecified: Secondary | ICD-10-CM | POA: Diagnosis present

## 2014-10-15 DIAGNOSIS — E46 Unspecified protein-calorie malnutrition: Secondary | ICD-10-CM | POA: Diagnosis present

## 2014-10-15 DIAGNOSIS — N400 Enlarged prostate without lower urinary tract symptoms: Secondary | ICD-10-CM | POA: Diagnosis present

## 2014-10-15 DIAGNOSIS — J189 Pneumonia, unspecified organism: Secondary | ICD-10-CM | POA: Diagnosis present

## 2014-10-15 DIAGNOSIS — E785 Hyperlipidemia, unspecified: Secondary | ICD-10-CM | POA: Diagnosis present

## 2014-10-15 DIAGNOSIS — Z87891 Personal history of nicotine dependence: Secondary | ICD-10-CM | POA: Diagnosis not present

## 2014-10-15 DIAGNOSIS — E875 Hyperkalemia: Secondary | ICD-10-CM | POA: Diagnosis present

## 2014-10-15 DIAGNOSIS — Z7951 Long term (current) use of inhaled steroids: Secondary | ICD-10-CM | POA: Diagnosis not present

## 2014-10-15 DIAGNOSIS — J441 Chronic obstructive pulmonary disease with (acute) exacerbation: Secondary | ICD-10-CM | POA: Diagnosis present

## 2014-10-15 DIAGNOSIS — Z951 Presence of aortocoronary bypass graft: Secondary | ICD-10-CM | POA: Diagnosis not present

## 2014-10-15 DIAGNOSIS — Z23 Encounter for immunization: Secondary | ICD-10-CM | POA: Diagnosis not present

## 2014-10-15 DIAGNOSIS — D472 Monoclonal gammopathy: Secondary | ICD-10-CM | POA: Diagnosis present

## 2014-10-15 DIAGNOSIS — G309 Alzheimer's disease, unspecified: Secondary | ICD-10-CM | POA: Diagnosis present

## 2014-10-15 DIAGNOSIS — Z7982 Long term (current) use of aspirin: Secondary | ICD-10-CM | POA: Diagnosis not present

## 2014-10-15 DIAGNOSIS — F0280 Dementia in other diseases classified elsewhere without behavioral disturbance: Secondary | ICD-10-CM | POA: Diagnosis present

## 2014-10-15 LAB — BLOOD GAS, ARTERIAL
ACID-BASE DEFICIT: 0.5 mmol/L (ref 0.0–2.0)
Bicarbonate: 23.9 mEq/L (ref 20.0–24.0)
Drawn by: 22223
FIO2: 21 %
O2 Saturation: 94.7 %
PATIENT TEMPERATURE: 37
PO2 ART: 77.5 mmHg — AB (ref 80.0–100.0)
TCO2: 21.7 mmol/L (ref 0–100)
pCO2 arterial: 41.1 mmHg (ref 35.0–45.0)
pH, Arterial: 7.382 (ref 7.350–7.450)

## 2014-10-15 LAB — LACTIC ACID, PLASMA: Lactic Acid, Venous: 0.9 mmol/L (ref 0.5–2.0)

## 2014-10-15 LAB — CBG MONITORING, ED: Glucose-Capillary: 101 mg/dL — ABNORMAL HIGH (ref 70–99)

## 2014-10-15 LAB — STREP PNEUMONIAE URINARY ANTIGEN: Strep Pneumo Urinary Antigen: NEGATIVE

## 2014-10-15 LAB — AMMONIA: Ammonia: 16 umol/L (ref 11–32)

## 2014-10-15 LAB — BRAIN NATRIURETIC PEPTIDE: B Natriuretic Peptide: 70 pg/mL (ref 0.0–100.0)

## 2014-10-15 MED ORDER — AZELASTINE HCL 0.1 % NA SOLN
1.0000 | Freq: Two times a day (BID) | NASAL | Status: DC
  Administered 2014-10-15: 2 via NASAL
  Administered 2014-10-15: 1 via NASAL
  Administered 2014-10-16: 2 via NASAL
  Filled 2014-10-15: qty 30

## 2014-10-15 MED ORDER — SODIUM CHLORIDE 0.9 % IV SOLN
INTRAVENOUS | Status: DC
  Administered 2014-10-15: 13:00:00 via INTRAVENOUS

## 2014-10-15 MED ORDER — INFLUENZA VAC SPLIT QUAD 0.5 ML IM SUSY
0.5000 mL | PREFILLED_SYRINGE | INTRAMUSCULAR | Status: AC
  Administered 2014-10-16: 0.5 mL via INTRAMUSCULAR
  Filled 2014-10-15: qty 0.5

## 2014-10-15 MED ORDER — AMMONIA AROMATIC IN INHA
RESPIRATORY_TRACT | Status: AC
  Filled 2014-10-15: qty 10

## 2014-10-15 MED ORDER — METOPROLOL SUCCINATE ER 25 MG PO TB24
25.0000 mg | ORAL_TABLET | Freq: Every day | ORAL | Status: DC
  Administered 2014-10-15 – 2014-10-16 (×2): 25 mg via ORAL
  Filled 2014-10-15 (×2): qty 1

## 2014-10-15 MED ORDER — HEPARIN SODIUM (PORCINE) 5000 UNIT/ML IJ SOLN
5000.0000 [IU] | Freq: Three times a day (TID) | INTRAMUSCULAR | Status: DC
  Administered 2014-10-15 – 2014-10-16 (×4): 5000 [IU] via SUBCUTANEOUS
  Filled 2014-10-15 (×4): qty 1

## 2014-10-15 MED ORDER — LATANOPROST 0.005 % OP SOLN
1.0000 [drp] | Freq: Every day | OPHTHALMIC | Status: DC
  Administered 2014-10-15: 1 [drp] via OPHTHALMIC
  Filled 2014-10-15: qty 2.5

## 2014-10-15 MED ORDER — PANTOPRAZOLE SODIUM 40 MG PO TBEC: 40.0000 mg | DELAYED_RELEASE_TABLET | Freq: Every day | ORAL | Status: DC

## 2014-10-15 MED ORDER — AZITHROMYCIN 250 MG PO TABS: 500.0000 mg | ORAL_TABLET | ORAL | Status: DC

## 2014-10-15 MED ORDER — CEFTRIAXONE SODIUM 1 G IJ SOLR
1.0000 g | INTRAMUSCULAR | Status: DC
  Administered 2014-10-16: 1 g via INTRAVENOUS
  Filled 2014-10-15 (×3): qty 10

## 2014-10-15 MED ORDER — AZITHROMYCIN 250 MG PO TABS
500.0000 mg | ORAL_TABLET | ORAL | Status: DC
  Administered 2014-10-16: 500 mg via ORAL
  Filled 2014-10-15: qty 2

## 2014-10-15 MED ORDER — ACETAMINOPHEN 500 MG PO TABS
1000.0000 mg | ORAL_TABLET | Freq: Four times a day (QID) | ORAL | Status: DC | PRN
  Administered 2014-10-15: 1000 mg via ORAL
  Filled 2014-10-15: qty 2

## 2014-10-15 MED ORDER — DONEPEZIL HCL 5 MG PO TABS
10.0000 mg | ORAL_TABLET | Freq: Every day | ORAL | Status: DC
  Administered 2014-10-15: 10 mg via ORAL
  Filled 2014-10-15: qty 2
  Filled 2014-10-15 (×3): qty 1

## 2014-10-15 MED ORDER — BISACODYL 5 MG PO TBEC: 5.0000 mg | DELAYED_RELEASE_TABLET | Freq: Every day | ORAL | Status: DC | PRN

## 2014-10-15 MED ORDER — DEXTROSE 5 % IV SOLN
500.0000 mg | Freq: Once | INTRAVENOUS | Status: AC
  Administered 2014-10-15: 500 mg via INTRAVENOUS
  Filled 2014-10-15: qty 500

## 2014-10-15 MED ORDER — MIRTAZAPINE 30 MG PO TABS
30.0000 mg | ORAL_TABLET | Freq: Every day | ORAL | Status: DC
  Administered 2014-10-15: 30 mg via ORAL
  Filled 2014-10-15: qty 1

## 2014-10-15 MED ORDER — ENSURE ENLIVE PO LIQD
237.0000 mL | Freq: Two times a day (BID) | ORAL | Status: DC
  Administered 2014-10-15 – 2014-10-16 (×2): 237 mL via ORAL

## 2014-10-15 MED ORDER — MEMANTINE HCL ER 28 MG PO CP24
28.0000 mg | ORAL_CAPSULE | Freq: Every day | ORAL | Status: DC
  Administered 2014-10-15 – 2014-10-16 (×2): 28 mg via ORAL
  Filled 2014-10-15 (×4): qty 1

## 2014-10-15 MED ORDER — ADULT MULTIVITAMIN W/MINERALS CH
1.0000 | ORAL_TABLET | Freq: Every day | ORAL | Status: DC
  Administered 2014-10-15 – 2014-10-16 (×2): 1 via ORAL
  Filled 2014-10-15 (×2): qty 1

## 2014-10-15 MED ORDER — ALBUTEROL SULFATE HFA 108 (90 BASE) MCG/ACT IN AERS
2.0000 | INHALATION_SPRAY | RESPIRATORY_TRACT | Status: DC | PRN
  Filled 2014-10-15: qty 6.7

## 2014-10-15 MED ORDER — PANTOPRAZOLE SODIUM 40 MG PO TBEC
80.0000 mg | DELAYED_RELEASE_TABLET | Freq: Every day | ORAL | Status: DC
  Administered 2014-10-15 – 2014-10-16 (×2): 80 mg via ORAL
  Filled 2014-10-15 (×2): qty 2

## 2014-10-15 MED ORDER — DEXTROSE 5 % IV SOLN
1.0000 g | Freq: Once | INTRAVENOUS | Status: AC
  Administered 2014-10-15: 1 g via INTRAVENOUS
  Filled 2014-10-15: qty 10

## 2014-10-15 MED ORDER — GABAPENTIN 300 MG PO CAPS
300.0000 mg | ORAL_CAPSULE | Freq: Every day | ORAL | Status: DC
  Administered 2014-10-15 – 2014-10-16 (×2): 300 mg via ORAL
  Filled 2014-10-15 (×2): qty 1

## 2014-10-15 MED ORDER — TAMSULOSIN HCL 0.4 MG PO CAPS
0.4000 mg | ORAL_CAPSULE | Freq: Every day | ORAL | Status: DC
  Administered 2014-10-15: 0.4 mg via ORAL
  Filled 2014-10-15: qty 1

## 2014-10-15 MED ORDER — ASPIRIN 325 MG PO TABS
325.0000 mg | ORAL_TABLET | Freq: Every day | ORAL | Status: DC
Start: 2014-10-15 — End: 2014-10-16
  Administered 2014-10-15 – 2014-10-16 (×2): 325 mg via ORAL
  Filled 2014-10-15 (×2): qty 1

## 2014-10-15 MED ORDER — ALBUTEROL SULFATE (2.5 MG/3ML) 0.083% IN NEBU
2.5000 mg | INHALATION_SOLUTION | Freq: Two times a day (BID) | RESPIRATORY_TRACT | Status: DC
  Administered 2014-10-15 – 2014-10-16 (×3): 2.5 mg via RESPIRATORY_TRACT
  Filled 2014-10-15 (×3): qty 3

## 2014-10-15 MED ORDER — FINASTERIDE 5 MG PO TABS
5.0000 mg | ORAL_TABLET | Freq: Every day | ORAL | Status: DC
  Administered 2014-10-15 – 2014-10-16 (×2): 5 mg via ORAL
  Filled 2014-10-15 (×4): qty 1

## 2014-10-15 MED ORDER — NORTRIPTYLINE HCL 10 MG PO CAPS
20.0000 mg | ORAL_CAPSULE | Freq: Every day | ORAL | Status: DC
  Administered 2014-10-15: 20 mg via ORAL
  Filled 2014-10-15 (×3): qty 2

## 2014-10-15 MED ORDER — CALCITRIOL 0.25 MCG PO CAPS
0.5000 ug | ORAL_CAPSULE | Freq: Every day | ORAL | Status: DC
  Administered 2014-10-15 – 2014-10-16 (×2): 0.5 ug via ORAL
  Filled 2014-10-15 (×2): qty 2
  Filled 2014-10-15: qty 1

## 2014-10-15 MED ORDER — FLUTICASONE PROPIONATE 50 MCG/ACT NA SUSP
2.0000 | Freq: Every day | NASAL | Status: DC
  Administered 2014-10-15 – 2014-10-16 (×2): 2 via NASAL
  Filled 2014-10-15: qty 16

## 2014-10-15 NOTE — Care Management Note (Addendum)
    Page 1 of 1   10/15/2014     3:42:11 PM CARE MANAGEMENT NOTE 10/15/2014  Patient:  [name], [name]   Account Number:  [id number]  Date Initiated:  10/15/2014  Documentation initiated by:  Theophilus Kinds  Subjective/Objective Assessment:   Pt admitted from home with pneumonia. Pt lives with his wife and will return home at discharge. Pt is independent with ADL's. Pt has neb machine for home use.     Action/Plan:   No CM needs noted. Anticipate discharge within 48 hours.   Anticipated DC Date:  10/17/2014   Anticipated DC Plan:  [place]  CM consult      Choice offered to / List presented to:             Status of service:  Completed, signed off Medicare Important Message given?  YES (If response is "NO", the following Medicare IM given date fields will be blank) Date Medicare IM given:  10/15/2014 Medicare IM given by:  Theophilus Kinds Date Additional Medicare IM given:   Additional Medicare IM given by:    Discharge Disposition:  HOME/SELF CARE  Per UR Regulation:    If discussed at Long Length of Stay Meetings, dates discussed:    Comments:  10/15/14 [place], RN BSN CM

## 2014-10-15 NOTE — H&P (Signed)
[facility] History and Physical  SELVIN YUN OAC:[id number] DOB: [birth year] DOA: 10/14/2014  Referring physician: EDP PCP: Gara Kroner, MD   Chief Complaint: AMS   HPI: [name] [name] is a 81 y.o. male who is brought in by his wife with AMS.  Patient has apparently been having hallucinations for the past week and talking "out of his head".  Wife has had a difficult time waking the patient at home recently.  He was seen at [facility] about 2 weeks ago for a COPD exacerbation and recently finished course of prednisone.  He has had persistent cough since that time however.    Review of Systems: Systems reviewed.  As above, otherwise negative  Past Medical History  Diagnosis Date  . PVD (peripheral vascular disease)     s/p left renal artery stent and left iliac stent  . Hypertension   . Hyperlipemia   . GERD (gastroesophageal reflux disease)   . COPD (chronic obstructive pulmonary disease)   . Monoclonal gammopathy   . BPH (benign prostatic hyperplasia)   . DEMENTIA   . Alzheimer disease   . Memory loss   . CAD (coronary artery disease)     s/p CABG in 1997 with known atretic LIMA to LAD and 40% LM.    Marland Kitchen Chronic kidney disease (CKD), stage III (moderate)   . Carotid artery stenosis     s/p left CEA.  Carotid dopplers 09/2013 60-79% right ICA stenosis and 1-39% left ICA stenosis of left CEA  . Chronic chest wall pain    Past Surgical History  Procedure Laterality Date  . Coronary artery bypass graft  1997  . Left cea  2002  . Back surgery  2011  . Cataract extraction w/phaco  09/18/2011    Procedure: CATARACT EXTRACTION PHACO AND INTRAOCULAR LENS PLACEMENT (IOC);  Surgeon: Elta Guadeloupe T. Gershon Crane, MD;  Location: [facility];  Service: Ophthalmology;  Laterality: Right;  CDE: 6.19  . Cardiac surgery     Social History:  reports that he quit smoking about 36 years ago. His smoking use included Cigarettes. He has a 20 pack-year smoking history. He has never used smokeless tobacco. He  reports that he does not drink alcohol or use illicit drugs.  No Known Allergies  Family History  Problem Relation Age of Onset  . Anesthesia problems Neg Hx   . Hypotension Neg Hx   . Malignant hyperthermia Neg Hx   . Pseudochol deficiency Neg Hx   . High blood pressure    . High Cholesterol    . Hypertension Father      Prior to Admission medications   Medication Sig Start Date End Date Taking? Authorizing Provider  acetaminophen (TYLENOL) 500 MG tablet Take 1,000 mg by mouth every 6 (six) hours as needed for headache.   Yes Historical Provider, MD  albuterol (PROVENTIL HFA;VENTOLIN HFA) 108 (90 BASE) MCG/ACT inhaler Inhale 2 puffs into the lungs every 4 (four) hours as needed for wheezing or shortness of breath. 09/15/14  Yes Francine Graven, DO  albuterol (PROVENTIL) (2.5 MG/3ML) 0.083% nebulizer solution Take 1 vial (88mL) 3-4 times per day. Patient taking differently: Take 2.5 mg by nebulization 2 (two) times daily. Take 1 vial (5mL) 3-4 times per day. 12/28/13  Yes Elsie Stain, MD  ALPRAZolam Duanne Moron) 0.5 MG tablet Take 1 tablet (0.5 mg total) by mouth daily. If needed Patient taking differently: Take 0.5 mg by mouth daily.  07/21/14  Yes Marcial Pacas, MD  aspirin 325 MG tablet  Take 325 mg by mouth daily.     Yes Historical Provider, MD  azelastine (ASTELIN) 0.1 % nasal spray Place 1-2 sprays into both nostrils 2 (two) times daily. Use in each nostril as directed   Yes Historical Provider, MD  bisacodyl (DULCOLAX) 5 MG EC tablet Take 5 mg by mouth daily as needed for moderate constipation.   Yes Historical Provider, MD  calcitRIOL (ROCALTROL) 0.5 MCG capsule Take 0.5 mcg by mouth daily. 07/29/14  Yes Historical Provider, MD  Cholecalciferol (VITAMIN D PO) Take 1 tablet by mouth daily.   Yes Historical Provider, MD  donepezil (ARICEPT) 10 MG tablet Take 1 tablet (10 mg total) by mouth at bedtime. 09/02/13  Yes Marcial Pacas, MD  finasteride (PROSCAR) 5 MG tablet Take 5 mg by mouth daily.     Yes Historical Provider, MD  fluticasone (FLONASE) 50 MCG/ACT nasal spray Place 2 sprays into both nostrils daily.   Yes Historical Provider, MD  gabapentin (NEURONTIN) 300 MG capsule Take 1 capsule (300 mg total) by mouth 3 (three) times daily. Patient taking differently: Take 300 mg by mouth daily.  01/25/14  Yes Marcial Pacas, MD  latanoprost (XALATAN) 0.005 % ophthalmic solution Place 1 drop into both eyes at bedtime.  12/28/13  Yes Historical Provider, MD  Memantine HCl ER (NAMENDA XR) 28 MG CP24 TAKE ONE CAPSULE BY MOUTH ONCE DAILY. Patient taking differently: Take 1 capsule by mouth daily.  07/13/14  Yes Marcial Pacas, MD  metoprolol succinate (TOPROL XL) 25 MG 24 hr tablet Take 1 tablet (25 mg total) by mouth daily. 08/27/14  Yes Sueanne Margarita, MD  mirtazapine (REMERON) 15 MG tablet Take 30 mg by mouth at bedtime.    Yes Historical Provider, MD  Multiple Vitamin (MULTIVITAMIN WITH MINERALS) TABS tablet Take 1 tablet by mouth daily.   Yes Historical Provider, MD  Naphazoline-Glycerin (CLEAR EYES REDNESS RELIEF OP) Apply 1 drop to eye daily.   Yes Historical Provider, MD  nitroGLYCERIN (NITROSTAT) 0.4 MG SL tablet Place 1 tablet (0.4 mg total) under the tongue every 5 (five) minutes as needed for chest pain. 10/07/14  Yes Sueanne Margarita, MD  nortriptyline (PAMELOR) 10 MG capsule Take 20 mg by mouth at bedtime.   Yes Historical Provider, MD  omeprazole (PRILOSEC) 40 MG capsule Take 40 mg by mouth every evening.  09/07/13  Yes Historical Provider, MD  Tamsulosin HCl (FLOMAX) 0.4 MG CAPS Take 0.4 mg by mouth at bedtime.    Yes Historical Provider, MD  lansoprazole (PREVACID) 30 MG capsule Take 30 mg by mouth daily. 09/22/14   Historical Provider, MD   Physical Exam: Filed Vitals:   10/15/14 0137  BP:   Pulse:   Temp: 96.6 F (35.9 C)  Resp:     BP 126/63 mmHg  Pulse 57  Temp(Src) 96.6 F (35.9 C) (Rectal)  Resp 10  Ht 5\' 7"  (1.702 m)  Wt 74.844 kg (165 lb)  BMI 25.84 kg/m2  SpO2 98%  General  Appearance:    Initially awake during the ED stay, patient becomes minimally responsive and difficult to arouse for several hours, however 20 mins later becomes easily arrousable and suddenly wakes up while I am performing a pupilary exam on him, not oriented to time, is oriented to person, place, and situation, no distress, appears stated age  Head:    Normocephalic, atraumatic  Eyes:    PERRL, EOMI, sclera non-icteric, does have history of cataract surgery on his R eye which is now irregular.  L eye is initially pin point while unresponsive however becomes regular and responsive suddenly during my exam as patient wakes up.     Nose:   Nares without drainage or epistaxis. Mucosa, turbinates normal  Throat:   Moist mucous membranes. Oropharynx without erythema or exudate.  Neck:   Supple. No carotid bruits.  No thyromegaly.  No lymphadenopathy.   Back:     No CVA tenderness, no spinal tenderness  Lungs:     Clear to auscultation bilaterally, without wheezes, rhonchi or rales  Chest wall:    No tenderness to palpitation  Heart:    Regular rate and rhythm without murmurs, gallops, rubs  Abdomen:     Soft, non-tender, nondistended, normal bowel sounds, no organomegaly  Genitalia:    deferred  Rectal:    deferred  Extremities:   No clubbing, cyanosis or edema.  Pulses:   2+ and symmetric all extremities  Skin:   Skin color, texture, turgor normal, no rashes or lesions  Lymph nodes:   Cervical, supraclavicular, and axillary nodes normal  Neurologic:   CNII-XII intact. Normal strength, sensation and reflexes      throughout    Labs on Admission:  Basic Metabolic Panel:  Recent Labs Lab 10/14/14 2243  NA 140  K 4.6  CL 107  CO2 27  GLUCOSE 96  BUN 18  CREATININE 2.42*  CALCIUM 9.0   Liver Function Tests:  Recent Labs Lab 10/14/14 2243  AST 21  ALT 15  ALKPHOS 56  BILITOT 0.3  PROT 7.0  ALBUMIN 3.4*   No results for input(s): LIPASE, AMYLASE in the last 168 hours.  Recent  Labs Lab 10/14/14 2336  AMMONIA 16   CBC:  Recent Labs Lab 10/14/14 2243  WBC 7.2  NEUTROABS 3.2  HGB 11.6*  HCT 34.3*  MCV 100.6*  PLT 203   Cardiac Enzymes:  Recent Labs Lab 10/14/14 2243  TROPONINI <0.03    BNP (last 3 results)  Recent Labs  01/27/14 1408 04/16/14 1120  PROBNP 21.0 152.4   CBG:  Recent Labs Lab 10/14/14 2356  GLUCAP 101*    Radiological Exams on Admission: Ct Head Wo Contrast  10/15/2014   CLINICAL DATA:  Hallucinations, acute onset.  Initial encounter.  EXAM: CT HEAD WITHOUT CONTRAST  TECHNIQUE: Contiguous axial images were obtained from the base of the skull through the vertex without intravenous contrast.  COMPARISON:  CT of the head performed 08/15/2013, and MRI of the brain performed 12/05/2012  FINDINGS: There is no evidence of acute infarction, mass lesion, or intra- or extra-axial hemorrhage on CT.  Prominence of the ventricles and sulci reflects mild to moderate cortical volume loss. Mild cerebellar atrophy is noted. Scattered periventricular white matter change likely reflects small vessel ischemic microangiopathy.  The brainstem and fourth ventricle are within normal limits. The basal ganglia are unremarkable in appearance. The cerebral hemispheres demonstrate grossly normal gray-white differentiation. No mass effect or midline shift is seen.  There is no evidence of fracture; visualized osseous structures are unremarkable in appearance. The orbits are within normal limits. The paranasal sinuses and mastoid air cells are well-aerated. No significant soft tissue abnormalities are seen.  IMPRESSION: 1. No acute intracranial pathology seen on CT. 2. Mild to moderate cortical volume loss and scattered small vessel ischemic microangiopathy.   Electronically Signed   By: Garald Balding M.D.   On: 10/15/2014 00:59   Dg Chest Port 1 View  10/15/2014   CLINICAL DATA:  Acute onset of altered  mental status. Initial encounter.  EXAM: PORTABLE CHEST - 1  VIEW  COMPARISON:  Chest radiograph performed 09/28/2014  FINDINGS: The lungs are well-aerated. Vascular congestion is noted. Mild right basilar airspace opacity may reflect mild interstitial edema or possibly pneumonia. There is no evidence of pleural effusion or pneumothorax.  The cardiomediastinal silhouette is borderline normal in size. The patient is status post median sternotomy. No acute osseous abnormalities are seen.  IMPRESSION: Vascular congestion noted. Mild right basilar airspace opacity may reflect mild interstitial edema or possibly pneumonia.   Electronically Signed   By: Garald Balding M.D.   On: 10/15/2014 00:57    EKG: Independently reviewed.  Assessment/Plan Principal Problem:   CAP (community acquired pneumonia) Active Problems:   Delirium   1. CAP - mild RLL CAP likely causing his cough and ultimately his waxing and waning mental status which is most c/w delirium. 1. PNA pathway 2. Rocephin and azithromycin 3. Cultures pending    Code Status: Full Code  Family Communication: Wife at bedside Disposition Plan: Admit to obs   Time spent: 70 min  GARDNER, JARED M. [facility] Pager [phone]  If 7AM-7PM, please contact the day team taking care of the patient [email or web address] Password [facility] 10/15/2014, 3:18 AM

## 2014-10-15 NOTE — Progress Notes (Signed)
UR completed 

## 2014-10-15 NOTE — Progress Notes (Signed)
INITIAL NUTRITION ASSESSMENT  DOCUMENTATION CODES Per approved criteria  -Not Applicable   INTERVENTION: -Ensure Enlive po BID, each supplement provides 350 kcal and 20 grams of protein  -Snacks per pt request  NUTRITION DIAGNOSIS: Inadequate oral intake related to reported nausea as evidenced by loss of three pounds last 2 weeks.   Goal: Pt to meet >/= 90% of their estimated nutrition needs   Monitor:  Oral intake, snack preferences, labs/procedures, better historian for wt/intake hx  Reason for Assessment: MST  81 y.o. male  Admitting Dx: CAP (community acquired pneumonia)  ASSESSMENT: 81 y.o. male who is brought in by his wife with AMS, PMHX sig for: COPD, PVD, HTN,  HLD, Dementia, CKD, CAD,  Pt reports having persistent nausea for the past two months. He claims he was 273 pounds and has fallen to 162. This does not line up with admitting dx and Pt has dx of dementia and likely has AMS.   He says he did boost/ensure at home. Will order as well as some snacks per pt request.   Nutrition Focused Physical Exam: Well nourished all areas    Height: Ht Readings from Last 1 Encounters:  10/15/14 5\' 7"  (1.702 m)    Weight: Wt Readings from Last 1 Encounters:  10/15/14 162 lb 4.1 oz (73.6 kg)    Ideal Body Weight: 148  % Ideal Body Weight: 109%  Wt Readings from Last 10 Encounters:  10/15/14 162 lb 4.1 oz (73.6 kg)  10/04/14 165 lb (74.844 kg)  09/28/14 165 lb (74.844 kg)  09/15/14 160 lb (72.576 kg)  08/27/14 162 lb 1.9 oz (73.537 kg)  07/31/14 165 lb (74.844 kg)  07/29/14 169 lb (76.658 kg)  04/16/14 169 lb (76.658 kg)  02/03/14 174 lb (78.926 kg)  01/27/14 170 lb (77.111 kg)  Loss of 3 pounds last 2 weeks  Usual Body Weight: Unknown  BMI:  Body mass index is 25.41 kg/(m^2).  Estimated Nutritional Needs: Kcal:  1700-1900 (23-26 kcal/kg) Protein: 74-88 g (1-1.2 g/kg) Fluid: >2 liters  Skin: WDL  Diet Order: Diet Heart Room service appropriate?:  Yes; Fluid consistency:: Thin  EDUCATION NEEDS: -No education needs identified at this time   Intake/Output Summary (Last 24 hours) at 10/15/14 1138 Last data filed at 10/15/14 0400  Gross per 24 hour  Intake      0 ml  Output    300 ml  Net   -300 ml    Last BM: WDL  Labs:   Recent Labs Lab 10/14/14 2243  NA 140  K 4.6  CL 107  CO2 27  BUN 18  CREATININE 2.42*  CALCIUM 9.0  GLUCOSE 96    CBG (last 3)   Recent Labs  10/14/14 2356  GLUCAP 101*    Scheduled Meds: . albuterol  2.5 mg Nebulization BID  . aspirin  325 mg Oral Daily  . azelastine  1-2 spray Each Nare BID  . [START ON 10/16/2014] azithromycin  500 mg Oral Q24H  . calcitRIOL  0.5 mcg Oral Daily  . [START ON 10/16/2014] cefTRIAXone (ROCEPHIN)  IV  1 g Intravenous Q24H  . donepezil  10 mg Oral QHS  . finasteride  5 mg Oral Daily  . fluticasone  2 spray Each Nare Daily  . gabapentin  300 mg Oral Daily  . heparin  5,000 Units Subcutaneous 3 times per day  . latanoprost  1 drop Both Eyes QHS  . memantine  28 mg Oral Daily  . metoprolol succinate  25 mg Oral Daily  . mirtazapine  30 mg Oral QHS  . multivitamin with minerals  1 tablet Oral Daily  . nortriptyline  20 mg Oral QHS  . pantoprazole  80 mg Oral Daily  . tamsulosin  0.4 mg Oral QHS    Continuous Infusions:   Past Medical History  Diagnosis Date  . PVD (peripheral vascular disease)     s/p left renal artery stent and left iliac stent  . Hypertension   . Hyperlipemia   . GERD (gastroesophageal reflux disease)   . COPD (chronic obstructive pulmonary disease)   . Monoclonal gammopathy   . BPH (benign prostatic hyperplasia)   . DEMENTIA   . Alzheimer disease   . Memory loss   . CAD (coronary artery disease)     s/p CABG in 1997 with known atretic LIMA to LAD and 40% LM.    Marland Kitchen Chronic kidney disease (CKD), stage III (moderate)   . Carotid artery stenosis     s/p left CEA.  Carotid dopplers 09/2013 60-79% right ICA stenosis and 1-39%  left ICA stenosis of left CEA  . Chronic chest wall pain     Past Surgical History  Procedure Laterality Date  . Coronary artery bypass graft  1997  . Left cea  2002  . Back surgery  2011  . Cataract extraction w/phaco  09/18/2011    Procedure: CATARACT EXTRACTION PHACO AND INTRAOCULAR LENS PLACEMENT (IOC);  Surgeon: Elta Guadeloupe T. Gershon Crane, MD;  Location: [facility];  Service: Ophthalmology;  Laterality: Right;  CDE: 6.19  . Cardiac surgery     Burtis Junes RD, LDN Nutrition Pager: [phone] 10/15/2014 11:47 AM

## 2014-10-15 NOTE — ED Notes (Signed)
Family at bedside. Patient is nonverbal at this time. Patient not alert or able to be aroused  at this time Patient placed on cardiac monitor at this time.

## 2014-10-15 NOTE — Evaluation (Signed)
Physical Therapy Evaluation Patient Details Name: [name] [name] MRN: [id number] DOB: 04-16-1931 Today's Date: 10/15/2014   History of Present Illness  HPI: [name] [name] is a 80 y.o. male who is brought in by his wife with AMS. Patient has apparently been having hallucinations for the past week and talking "out of his head". Wife has had a difficult time waking the patient at home recently. He was seen at [facility] about 2 weeks ago for a COPD exacerbation and recently finished course of prednisone. He has had persistent cough since that time however.  Clinical Impression   Pt is seen for evaluation and found to be very alert and cooperative.  He is oriented to self and place.  His strength, balance and endurance are all WNL.  He should not have any problems transitioning to home at d/c.    Follow Up Recommendations No PT follow up    Equipment Recommendations  None recommended by PT    Recommendations for Other Services   none    Precautions / Restrictions Precautions Precautions: None Restrictions Weight Bearing Restrictions: No      Mobility  Bed Mobility Overal bed mobility: Independent                Transfers Overall transfer level: Independent                  Ambulation/Gait Ambulation/Gait assistance: Independent Ambulation Distance (Feet): 250 Feet Assistive device: None Gait Pattern/deviations: WFL(Within Functional Limits)   Gait velocity interpretation: at or above normal speed for age/gender    Stairs            Wheelchair Mobility    Modified Rankin (Stroke Patients Only)       Balance Overall balance assessment: No apparent balance deficits (not formally assessed)                                           Pertinent Vitals/Pain Pain Assessment: No/denies pain    Home Living Family/patient expects to be discharged to:: Private residence Living Arrangements: Spouse/significant other Available Help at  Discharge: Family;Available 24 hours/day Type of Home: Apartment Home Access: Level entry     Home Layout: One level Home Equipment: None      Prior Function Level of Independence: Independent               Hand Dominance        Extremity/Trunk Assessment   Upper Extremity Assessment: Overall WFL for tasks assessed           Lower Extremity Assessment: Overall WFL for tasks assessed      Cervical / Trunk Assessment: Normal  Communication   Communication: No difficulties  Cognition Arousal/Alertness: Awake/alert Behavior During Therapy: WFL for tasks assessed/performed Overall Cognitive Status: History of cognitive impairments - at baseline                      General Comments      Exercises        Assessment/Plan    PT Assessment Patent does not need any further PT services  PT Diagnosis     PT Problem List    PT Treatment Interventions     PT Goals (Current goals can be found in the Care Plan section) Acute Rehab PT Goals PT Goal Formulation: All assessment and education complete, DC therapy  Frequency     Barriers to discharge    none    Co-evaluation               End of Session Equipment Utilized During Treatment: Gait belt Activity Tolerance: Patient tolerated treatment well Patient left: in chair;with call bell/phone within reach;with chair alarm set      Functional Assessment Tool Used: clinical judgement Functional Limitation: Mobility: Walking and moving around Mobility: Walking and Moving Around Current Status [phone]): 0 percent impaired, limited or restricted Mobility: Walking and Moving Around Goal Status [phone]): 0 percent impaired, limited or restricted Mobility: Walking and Moving Around Discharge Status [phone]): 0 percent impaired, limited or restricted    Time: 9147-8295 PT Time Calculation (min) (ACUTE ONLY): 16 min   Charges:   PT Evaluation $Initial PT Evaluation Tier I: 1 Procedure     PT  G Codes:   PT G-Codes **NOT FOR INPATIENT CLASS** Functional Assessment Tool Used: clinical judgement Functional Limitation: Mobility: Walking and moving around Mobility: Walking and Moving Around Current Status (A2130): 0 percent impaired, limited or restricted Mobility: Walking and Moving Around Goal Status (Q6578): 0 percent impaired, limited or restricted Mobility: Walking and Moving Around Discharge Status (I6962): 0 percent impaired, limited or restricted    [name] [name] L 10/15/2014, 12:16 PM

## 2014-10-15 NOTE — Progress Notes (Signed)
[facility] PROGRESS NOTE  [name] [name] GYK:[id number] DOB: 10-17-1930 DOA: 10/14/2014 PCP: Gara Kroner, MD  Brief narrative 81 year old male with history of COPD, Alzheimer's dementia (mild to moderate), chronic kidney disease stage III, carotid artery stenosis status post left CEA in 2015, PVD (status post left renal artery and left iliac stent,), GERD, BPH, CAD status post CABG in 97, recently treated for mild COPD exacerbation from the ED brought to the hospital by his wife for altered mental status and visual hallucinations. Patient was having persistent cough at home and some chills. Patient's vitals were stable in the ED. Head CT unremarkable. Chest x-ray showing right basilar opacity suggestive of pneumonia. Admitted for community-acquired pneumonia.  Assessment/Plan: Acute encephalopathy secondary to Community acquired pneumonia Continue empiric Rocephin and azithromycin. Follow cultures, urine strep and Legionella antigen. Supportive care with antitussives and nebs. Past wife patient's mental status has significantly improved this morning.   Acute  on CKD stage III Possible for associated dehydration. Monitor with gentle IV fluids   CAD Continue aspirin and metoprolol  Alzheimer's dementia Continue Namenda and Aricept  BPH Continue Proscar Flomax  Protein calorie malnutrition Continue supplements   Code Status: Full code Family Communication: Wife at bedside Disposition Plan: home Likely tomorrow. Seen by PT. No home care needs   Consultants:  none  Procedures:  none  Antibiotics:  IV rocephin and azithro 3/25--  HPI/Subjective: Patient seen and examined. Admission H&P reviewed. As per wife is mental status is improving towards baseline. Patient denies any symptoms.  Objective: Filed Vitals:   10/15/14 0610  BP: 155/66  Pulse: 56  Temp: 97.8 F (36.6 C)  Resp: 14    Intake/Output Summary (Last 24 hours) at 10/15/14 1232 Last data  filed at 10/15/14 0900  Gross per 24 hour  Intake    120 ml  Output    300 ml  Net   -180 ml   Filed Weights   10/14/14 2202 10/15/14 0425  Weight: 74.844 kg (165 lb) 73.6 kg (162 lb 4.1 oz)    Exam:   General:  Elderly male in no acute distress  HEENT: No pallor, moist oral mucosa, supple neck  Chest: Coarse breath sounds over right lung base, no rhonchi or wheeze  CVS: Normal S1 and S2, no murmurs rub or gallop  GI: Soft, nondistended, nontender, bowel sounds present  Musculoskeletal: Warm, no edema  CNS: AAOX2, , nonfocal   Data Reviewed: Basic Metabolic Panel:  Recent Labs Lab 10/14/14 2243  NA 140  K 4.6  CL 107  CO2 27  GLUCOSE 96  BUN 18  CREATININE 2.42*  CALCIUM 9.0   Liver Function Tests:  Recent Labs Lab 10/14/14 2243  AST 21  ALT 15  ALKPHOS 56  BILITOT 0.3  PROT 7.0  ALBUMIN 3.4*   No results for input(s): LIPASE, AMYLASE in the last 168 hours.  Recent Labs Lab 10/14/14 2336  AMMONIA 16   CBC:  Recent Labs Lab 10/14/14 2243  WBC 7.2  NEUTROABS 3.2  HGB 11.6*  HCT 34.3*  MCV 100.6*  PLT 203   Cardiac Enzymes:  Recent Labs Lab 10/14/14 2243  TROPONINI <0.03   BNP (last 3 results)  Recent Labs  09/28/14 2217 10/15/14 0155  BNP 491.7* 70.0    ProBNP (last 3 results)  Recent Labs  01/27/14 1408 04/16/14 1120  PROBNP 21.0 152.4    CBG:  Recent Labs Lab 10/14/14 2356  GLUCAP 101*    Recent Results (from the past  240 hour(s))  Blood culture (routine x 2)     Status: None (Preliminary result)   Collection Time: 10/15/14  1:55 AM  Result Value Ref Range Status   Specimen Description BLOOD LEFT FOREARM  Final   Special Requests   Final    BOTTLES DRAWN AEROBIC AND ANAEROBIC AEB 8CC ANA 5CC   Culture PENDING  Incomplete   Report Status PENDING  Incomplete  Blood culture (routine x 2)     Status: None (Preliminary result)   Collection Time: 10/15/14  2:10 AM  Result Value Ref Range Status    Specimen Description BLOOD LEFT HAND  Final   Special Requests BOTTLES DRAWN AEROBIC AND ANAEROBIC 8CC EACH  Final   Culture PENDING  Incomplete   Report Status PENDING  Incomplete     Studies: Ct Head Wo Contrast  10/15/2014   CLINICAL DATA:  Hallucinations, acute onset.  Initial encounter.  EXAM: CT HEAD WITHOUT CONTRAST  TECHNIQUE: Contiguous axial images were obtained from the base of the skull through the vertex without intravenous contrast.  COMPARISON:  CT of the head performed 08/15/2013, and MRI of the brain performed 12/05/2012  FINDINGS: There is no evidence of acute infarction, mass lesion, or intra- or extra-axial hemorrhage on CT.  Prominence of the ventricles and sulci reflects mild to moderate cortical volume loss. Mild cerebellar atrophy is noted. Scattered periventricular white matter change likely reflects small vessel ischemic microangiopathy.  The brainstem and fourth ventricle are within normal limits. The basal ganglia are unremarkable in appearance. The cerebral hemispheres demonstrate grossly normal gray-white differentiation. No mass effect or midline shift is seen.  There is no evidence of fracture; visualized osseous structures are unremarkable in appearance. The orbits are within normal limits. The paranasal sinuses and mastoid air cells are well-aerated. No significant soft tissue abnormalities are seen.  IMPRESSION: 1. No acute intracranial pathology seen on CT. 2. Mild to moderate cortical volume loss and scattered small vessel ischemic microangiopathy.   Electronically Signed   By: Garald Balding M.D.   On: 10/15/2014 00:59   Dg Chest Port 1 View  10/15/2014   CLINICAL DATA:  Acute onset of altered mental status. Initial encounter.  EXAM: PORTABLE CHEST - 1 VIEW  COMPARISON:  Chest radiograph performed 09/28/2014  FINDINGS: The lungs are well-aerated. Vascular congestion is noted. Mild right basilar airspace opacity may reflect mild interstitial edema or possibly  pneumonia. There is no evidence of pleural effusion or pneumothorax.  The cardiomediastinal silhouette is borderline normal in size. The patient is status post median sternotomy. No acute osseous abnormalities are seen.  IMPRESSION: Vascular congestion noted. Mild right basilar airspace opacity may reflect mild interstitial edema or possibly pneumonia.   Electronically Signed   By: Garald Balding M.D.   On: 10/15/2014 00:57    Scheduled Meds: . albuterol  2.5 mg Nebulization BID  . aspirin  325 mg Oral Daily  . azelastine  1-2 spray Each Nare BID  . [START ON 10/16/2014] azithromycin  500 mg Oral Q24H  . calcitRIOL  0.5 mcg Oral Daily  . [START ON 10/16/2014] cefTRIAXone (ROCEPHIN)  IV  1 g Intravenous Q24H  . donepezil  10 mg Oral QHS  . feeding supplement (ENSURE ENLIVE)  237 mL Oral BID BM  . finasteride  5 mg Oral Daily  . fluticasone  2 spray Each Nare Daily  . gabapentin  300 mg Oral Daily  . heparin  5,000 Units Subcutaneous 3 times per day  . latanoprost  1 drop Both Eyes QHS  . memantine  28 mg Oral Daily  . metoprolol succinate  25 mg Oral Daily  . mirtazapine  30 mg Oral QHS  . multivitamin with minerals  1 tablet Oral Daily  . nortriptyline  20 mg Oral QHS  . pantoprazole  80 mg Oral Daily  . tamsulosin  0.4 mg Oral QHS   Continuous Infusions:     Time spent: 20 minutes    Wong Steadham, [place]  [facility] Pager [phone]. If 7PM-7AM, please contact night-coverage at [email or web address], password [facility] 10/15/2014, 12:32 PM

## 2014-10-16 DIAGNOSIS — G9340 Encephalopathy, unspecified: Secondary | ICD-10-CM | POA: Diagnosis not present

## 2014-10-16 DIAGNOSIS — N183 Chronic kidney disease, stage 3 (moderate): Secondary | ICD-10-CM

## 2014-10-16 DIAGNOSIS — N179 Acute kidney failure, unspecified: Secondary | ICD-10-CM

## 2014-10-16 DIAGNOSIS — G309 Alzheimer's disease, unspecified: Secondary | ICD-10-CM | POA: Diagnosis not present

## 2014-10-16 DIAGNOSIS — E875 Hyperkalemia: Secondary | ICD-10-CM

## 2014-10-16 DIAGNOSIS — J189 Pneumonia, unspecified organism: Secondary | ICD-10-CM | POA: Diagnosis not present

## 2014-10-16 DIAGNOSIS — E46 Unspecified protein-calorie malnutrition: Secondary | ICD-10-CM | POA: Diagnosis not present

## 2014-10-16 DIAGNOSIS — D472 Monoclonal gammopathy: Secondary | ICD-10-CM | POA: Diagnosis not present

## 2014-10-16 DIAGNOSIS — E785 Hyperlipidemia, unspecified: Secondary | ICD-10-CM | POA: Diagnosis not present

## 2014-10-16 DIAGNOSIS — J441 Chronic obstructive pulmonary disease with (acute) exacerbation: Secondary | ICD-10-CM | POA: Diagnosis not present

## 2014-10-16 LAB — BASIC METABOLIC PANEL
Anion gap: 4 — ABNORMAL LOW (ref 5–15)
BUN: 16 mg/dL (ref 6–23)
CO2: 26 mmol/L (ref 19–32)
CREATININE: 1.96 mg/dL — AB (ref 0.50–1.35)
Calcium: 8.9 mg/dL (ref 8.4–10.5)
Chloride: 111 mmol/L (ref 96–112)
GFR calc Af Amer: 35 mL/min — ABNORMAL LOW (ref >90)
GFR calc non Af Amer: 30 mL/min — ABNORMAL LOW (ref >90)
GLUCOSE: 86 mg/dL (ref 70–99)
POTASSIUM: 5.4 mmol/L — AB (ref 3.5–5.1)
Sodium: 141 mmol/L (ref 135–145)

## 2014-10-16 LAB — HIV ANTIBODY (ROUTINE TESTING W REFLEX): HIV SCREEN 4TH GENERATION: NONREACTIVE

## 2014-10-16 MED ORDER — LEVOFLOXACIN 750 MG PO TABS: 750.0000 mg | ORAL_TABLET | ORAL | Status: AC

## 2014-10-16 MED ORDER — SODIUM POLYSTYRENE SULFONATE 15 GM/60ML PO SUSP: 30.0000 g | Freq: Once | ORAL | Status: DC

## 2014-10-16 MED ORDER — ENSURE ENLIVE PO LIQD: 237.0000 mL | Freq: Two times a day (BID) | ORAL | Status: AC

## 2014-10-16 MED ORDER — GUAIFENESIN-DM 100-10 MG/5ML PO SYRP: 5.0000 mL | ORAL_SOLUTION | ORAL | Status: DC | PRN

## 2014-10-16 NOTE — Progress Notes (Signed)
Patient discharged home with wife, discharge instructions reviewed, medications reviewed, follow up appts given. Patient and wife verbalized understanding. Patient left floor via wheelchair accompanied by staff. No c/o pain or shortness of breath at d/c.  [name] [name] [employer]

## 2014-10-16 NOTE — Discharge Instructions (Signed)
Pneumonia, Adult  Pneumonia is an infection of the lungs. It may be caused by a germ (virus or bacteria). Some types of pneumonia can spread easily from person to person. This can happen when you cough or sneeze.  HOME CARE   Only take medicine as told by your doctor.   Take your medicine (antibiotics) as told. Finish it even if you start to feel better.   Do not smoke.   You may use a vaporizer or humidifier in your room. This can help loosen thick spit (mucus).   Sleep so you are almost sitting up (semi-upright). This helps reduce coughing.   Rest.  A shot (vaccine) can help prevent pneumonia. Shots are often advised for:   People over 65 years old.   Patients on chemotherapy.   People with long-term (chronic) lung problems.   People with immune system problems.  GET HELP RIGHT AWAY IF:    You are getting worse.   You cannot control your cough, and you are losing sleep.   You cough up blood.   Your pain gets worse, even with medicine.   You have a fever.   Any of your problems are getting worse, not better.   You have shortness of breath or chest pain.  MAKE SURE YOU:    Understand these instructions.   Will watch your condition.   Will get help right away if you are not doing well or get worse.  Document Released: 12/26/2007 Document Revised: 10/01/2011 Document Reviewed: 09/29/2010  ExitCare Patient Information 2015 [employer]. This information is not intended to replace advice given to you by your health care provider. Make sure you discuss any questions you have with your health care provider.

## 2014-10-16 NOTE — Discharge Summary (Signed)
Physician Discharge Summary  VU LIEBMAN FYB:[id number] DOB: [birth year] DOA: 10/14/2014  PCP: Gara Kroner, MD  Admit date: 10/14/2014 Discharge date: 10/16/2014  Time spent: <49minutes  Recommendations for Outpatient Follow-up:  1. Discharge home with outpt PCP appt in 1 week ( has appt on 10/22/2014). Please check blood cx results and repeat potassium level during outpt follow up.  2. Completes antibiotics on 10/20/2014   Discharge Diagnoses:  Principal Problem:   CAP (community acquired pneumonia)   Active Problems:   Delirium   Acute renal failure superimposed on stage 3 chronic kidney disease   Allergic rhinitis   Obstructive chronic bronchitis with exacerbation, Gold C Copd   GERD (gastroesophageal reflux disease)   Alzheimer disease   Hyperkalemia   Discharge Condition: fair  Diet recommendation: heart healthy  Filed Weights   10/14/14 2202 10/15/14 0425  Weight: 74.844 kg (165 lb) 73.6 kg (162 lb 4.1 oz)    History of present illness:  Please refer to admission H&P for details, in brief, 81 year old male with history of COPD, Alzheimer's dementia (mild to moderate), chronic kidney disease stage III, carotid artery stenosis status post left CEA in 2015, PVD (status post left renal artery and left iliac stent,), GERD, BPH, CAD status post CABG in 97, recently treated for mild COPD exacerbation from the ED brought to the hospital by his wife for altered mental status and visual hallucinations. Patient was having persistent cough at home and some chills. Patient's vitals were stable in the ED. Head CT unremarkable. Chest x-ray showing right basilar opacity suggestive of pneumonia. Admitted for community-acquired pneumonia.  Hospital Course:  Acute encephalopathy secondary to Community acquired pneumonia -placed on  empiric Rocephin and azithromycin. Supportive care with antitussives and nebs given. -pt remains afebrile and mental status back to baseline. He is stable  for discharge home on oral levaquin 750 mg q 48 hr for 5-7 day course( completes on 3/30)..  Follow cultures, urine strep and Legionella antigen as outpt.   Acute on CKD stage III Possible for associated dehydration. Improved to baseline with hydration.  hyperkalemia k of 5.4 this am . Ordered kayexalate prior to discharge. please monitor potassium level during outpt visit.   COPD, gold C continue home inhaler and nebs as needed.  CAD Continue aspirin and metoprolol  Alzheimer's dementia Continue Namenda and Aricept  BPH Continue Proscar and  Flomax  Inadequate oral intake  seen by dietician and added ensure supplements   Code Status: Full code  Family Communication: Wife at bedside  Disposition Plan: home     Consultants:  none  Procedures:  none  Antibiotics:  IV rocephin and azithro 3/25--    Discharge Exam: Filed Vitals:   10/16/14 0550  BP: 144/62  Pulse: 58  Temp: 98.1 F (36.7 C)  Resp: 20     General: Elderly male in no acute distress  HEENT: No pallor, moist oral mucosa, supple neck  Chest: clear b/l,, no rhonchi or wheeze  CVS: Normal S1 and S2, no murmurs rub or gallop  GI: Soft, nondistended, nontender, bowel sounds present  Musculoskeletal: Warm, no edema  CNS: AAOX2, , nonfocal  Discharge Instructions    Current Discharge Medication List    START taking these medications   Details  guaiFENesin-dextromethorphan (ROBITUSSIN DM) 100-10 MG/5ML syrup Take 5 mLs by mouth every 4 (four) hours as needed for cough. Qty: 118 mL, Refills: 0    levofloxacin (LEVAQUIN) 750 MG tablet Take 1 tablet (750 mg total) by mouth every  other day. Qty: 3 tablet, Refills: 0  Ensure enlive liquid                                  237 ml twice daily                                                                   30 bottles, 5 refills    CONTINUE these medications which have NOT CHANGED   Details  acetaminophen (TYLENOL) 500 MG tablet  Take 1,000 mg by mouth every 6 (six) hours as needed for headache.    albuterol (PROVENTIL HFA;VENTOLIN HFA) 108 (90 BASE) MCG/ACT inhaler Inhale 2 puffs into the lungs every 4 (four) hours as needed for wheezing or shortness of breath. Qty: 1 Inhaler, Refills: 0    albuterol (PROVENTIL) (2.5 MG/3ML) 0.083% nebulizer solution Take 1 vial (25mL) 3-4 times per day. Qty: 1080 mL, Refills: 1    ALPRAZolam (XANAX) 0.5 MG tablet Take 1 tablet (0.5 mg total) by mouth daily. If needed Qty: 90 tablet, Refills: 1    aspirin 325 MG tablet Take 325 mg by mouth daily.      azelastine (ASTELIN) 0.1 % nasal spray Place 1-2 sprays into both nostrils 2 (two) times daily. Use in each nostril as directed    bisacodyl (DULCOLAX) 5 MG EC tablet Take 5 mg by mouth daily as needed for moderate constipation.    calcitRIOL (ROCALTROL) 0.5 MCG capsule Take 0.5 mcg by mouth daily.    Cholecalciferol (VITAMIN D PO) Take 1 tablet by mouth daily.    donepezil (ARICEPT) 10 MG tablet Take 1 tablet (10 mg total) by mouth at bedtime. Qty: 90 tablet, Refills: 3    finasteride (PROSCAR) 5 MG tablet Take 5 mg by mouth daily.     fluticasone (FLONASE) 50 MCG/ACT nasal spray Place 2 sprays into both nostrils daily.    gabapentin (NEURONTIN) 300 MG capsule Take 1 capsule (300 mg total) by mouth 3 (three) times daily. Qty: 270 capsule, Refills: 3    latanoprost (XALATAN) 0.005 % ophthalmic solution Place 1 drop into both eyes at bedtime.    Associated Diagnoses: Monoclonal paraproteinemia    Memantine HCl ER (NAMENDA XR) 28 MG CP24 TAKE ONE CAPSULE BY MOUTH ONCE DAILY. Qty: 90 capsule, Refills: 1    metoprolol succinate (TOPROL XL) 25 MG 24 hr tablet Take 1 tablet (25 mg total) by mouth daily. Qty: 30 tablet, Refills: 6    mirtazapine (REMERON) 15 MG tablet Take 30 mg by mouth at bedtime.     Multiple Vitamin (MULTIVITAMIN WITH MINERALS) TABS tablet Take 1 tablet by mouth daily.    Naphazoline-Glycerin (CLEAR EYES  REDNESS RELIEF OP) Apply 1 drop to eye daily.    nitroGLYCERIN (NITROSTAT) 0.4 MG SL tablet Place 1 tablet (0.4 mg total) under the tongue every 5 (five) minutes as needed for chest pain. Qty: 90 tablet, Refills: 3    nortriptyline (PAMELOR) 10 MG capsule Take 20 mg by mouth at bedtime.    omeprazole (PRILOSEC) 40 MG capsule Take 40 mg by mouth every evening.     Tamsulosin HCl (FLOMAX) 0.4 MG CAPS Take 0.4 mg by mouth at bedtime.  STOP taking these medications     lansoprazole (PREVACID) 30 MG capsule        No Known Allergies Follow-up Information    Follow up with Gara Kroner, MD On 10/22/2014.   Specialty:  Family Medicine   Contact information:   [address] [place] [address] [phone]        The results of significant diagnostics from this hospitalization (including imaging, microbiology, ancillary and laboratory) are listed below for reference.    Significant Diagnostic Studies: Dg Chest 2 View  09/28/2014   CLINICAL DATA:  Mid chest pain and shortness of breath for 4 months.  EXAM: CHEST  2 VIEW  COMPARISON:  09/28/2014 from [facility]. Previous studies from 09/15/2014 and 12/10/2013  FINDINGS: Postoperative changes in the mediastinum. Normal heart size and pulmonary vascularity. Coarse infiltration in the lungs most likely to represent diffuse fibrosis. No significant change since previous study. No developing consolidation or airspace disease. No blunting of costophrenic angles. No pneumothorax.  IMPRESSION: Diffuse parenchymal fibrosis in the lungs. No change since prior study.   Electronically Signed   By: Lucienne Capers M.D.   On: 09/28/2014 23:06   Ct Head Wo Contrast  10/15/2014   CLINICAL DATA:  Hallucinations, acute onset.  Initial encounter.  EXAM: CT HEAD WITHOUT CONTRAST  TECHNIQUE: Contiguous axial images were obtained from the base of the skull through the vertex without intravenous contrast.  COMPARISON:  CT of the  head performed 08/15/2013, and MRI of the brain performed 12/05/2012  FINDINGS: There is no evidence of acute infarction, mass lesion, or intra- or extra-axial hemorrhage on CT.  Prominence of the ventricles and sulci reflects mild to moderate cortical volume loss. Mild cerebellar atrophy is noted. Scattered periventricular white matter change likely reflects small vessel ischemic microangiopathy.  The brainstem and fourth ventricle are within normal limits. The basal ganglia are unremarkable in appearance. The cerebral hemispheres demonstrate grossly normal gray-white differentiation. No mass effect or midline shift is seen.  There is no evidence of fracture; visualized osseous structures are unremarkable in appearance. The orbits are within normal limits. The paranasal sinuses and mastoid air cells are well-aerated. No significant soft tissue abnormalities are seen.  IMPRESSION: 1. No acute intracranial pathology seen on CT. 2. Mild to moderate cortical volume loss and scattered small vessel ischemic microangiopathy.   Electronically Signed   By: Garald Balding M.D.   On: 10/15/2014 00:59   Dg Chest Port 1 View  10/15/2014   CLINICAL DATA:  Acute onset of altered mental status. Initial encounter.  EXAM: PORTABLE CHEST - 1 VIEW  COMPARISON:  Chest radiograph performed 09/28/2014  FINDINGS: The lungs are well-aerated. Vascular congestion is noted. Mild right basilar airspace opacity may reflect mild interstitial edema or possibly pneumonia. There is no evidence of pleural effusion or pneumothorax.  The cardiomediastinal silhouette is borderline normal in size. The patient is status post median sternotomy. No acute osseous abnormalities are seen.  IMPRESSION: Vascular congestion noted. Mild right basilar airspace opacity may reflect mild interstitial edema or possibly pneumonia.   Electronically Signed   By: Garald Balding M.D.   On: 10/15/2014 00:57    Microbiology: Recent Results (from the past 240 hour(s))   Blood culture (routine x 2)     Status: None (Preliminary result)   Collection Time: 10/15/14  1:55 AM  Result Value Ref Range Status   Specimen Description BLOOD LEFT FOREARM  Final   Special Requests   Final  BOTTLES DRAWN AEROBIC AND ANAEROBIC AEB 8CC ANA 5CC   Culture NO GROWTH 1 DAY  Final   Report Status PENDING  Incomplete  Blood culture (routine x 2)     Status: None (Preliminary result)   Collection Time: 10/15/14  2:10 AM  Result Value Ref Range Status   Specimen Description BLOOD LEFT HAND  Final   Special Requests BOTTLES DRAWN AEROBIC AND ANAEROBIC 8CC EACH  Final   Culture NO GROWTH 1 DAY  Final   Report Status PENDING  Incomplete     Labs: Basic Metabolic Panel:  Recent Labs Lab 10/14/14 2243 10/16/14 0608  NA 140 141  K 4.6 5.4*  CL 107 111  CO2 27 26  GLUCOSE 96 86  BUN 18 16  CREATININE 2.42* 1.96*  CALCIUM 9.0 8.9   Liver Function Tests:  Recent Labs Lab 10/14/14 2243  AST 21  ALT 15  ALKPHOS 56  BILITOT 0.3  PROT 7.0  ALBUMIN 3.4*   No results for input(s): LIPASE, AMYLASE in the last 168 hours.  Recent Labs Lab 10/14/14 2336  AMMONIA 16   CBC:  Recent Labs Lab 10/14/14 2243  WBC 7.2  NEUTROABS 3.2  HGB 11.6*  HCT 34.3*  MCV 100.6*  PLT 203   Cardiac Enzymes:  Recent Labs Lab 10/14/14 2243  TROPONINI <0.03   BNP: BNP (last 3 results)  Recent Labs  09/28/14 2217 10/15/14 0155  BNP 491.7* 70.0    ProBNP (last 3 results)  Recent Labs  01/27/14 1408 04/16/14 1120  PROBNP 21.0 152.4    CBG:  Recent Labs Lab 10/14/14 2356  GLUCAP 101*       Signed:  Melinda Gwinner  [facility] 10/16/2014, 8:03 AM

## 2014-10-18 LAB — LEGIONELLA ANTIGEN, URINE

## 2014-10-21 ENCOUNTER — Telehealth: Payer: Self-pay | Admitting: *Deleted

## 2014-10-21 NOTE — Telephone Encounter (Signed)
Patient's wife is calling for Rx refill for Namenda.

## 2014-10-21 NOTE — Telephone Encounter (Signed)
I called [facility] at [phone] Patient ID# [id number].  It is too soon for a refill at this time.  They will automatically ship this order when it is due on 04/30 Ref # [id number].  I called back to advise.

## 2014-10-22 DIAGNOSIS — J189 Pneumonia, unspecified organism: Secondary | ICD-10-CM | POA: Diagnosis not present

## 2014-10-22 DIAGNOSIS — J449 Chronic obstructive pulmonary disease, unspecified: Secondary | ICD-10-CM | POA: Diagnosis not present

## 2014-10-22 DIAGNOSIS — R0789 Other chest pain: Secondary | ICD-10-CM | POA: Diagnosis not present

## 2014-10-22 DIAGNOSIS — K5900 Constipation, unspecified: Secondary | ICD-10-CM | POA: Diagnosis not present

## 2014-10-22 DIAGNOSIS — K219 Gastro-esophageal reflux disease without esophagitis: Secondary | ICD-10-CM | POA: Diagnosis not present

## 2014-10-22 DIAGNOSIS — N183 Chronic kidney disease, stage 3 (moderate): Secondary | ICD-10-CM | POA: Diagnosis not present

## 2014-10-22 DIAGNOSIS — I10 Essential (primary) hypertension: Secondary | ICD-10-CM | POA: Diagnosis not present

## 2014-10-22 DIAGNOSIS — I77811 Abdominal aortic ectasia: Secondary | ICD-10-CM | POA: Diagnosis not present

## 2014-10-23 LAB — CULTURE, BLOOD (ROUTINE X 2)
Culture: NO GROWTH
Culture: NO GROWTH

## 2014-10-27 ENCOUNTER — Encounter (INDEPENDENT_AMBULATORY_CARE_PROVIDER_SITE_OTHER): Payer: Self-pay

## 2014-10-27 ENCOUNTER — Ambulatory Visit (INDEPENDENT_AMBULATORY_CARE_PROVIDER_SITE_OTHER): Payer: Commercial Managed Care - HMO | Admitting: Critical Care Medicine

## 2014-10-27 ENCOUNTER — Other Ambulatory Visit (INDEPENDENT_AMBULATORY_CARE_PROVIDER_SITE_OTHER): Payer: Commercial Managed Care - HMO

## 2014-10-27 ENCOUNTER — Ambulatory Visit (INDEPENDENT_AMBULATORY_CARE_PROVIDER_SITE_OTHER)
Admission: RE | Admit: 2014-10-27 | Discharge: 2014-10-27 | Disposition: A | Discharge status: Home / Self Care | Payer: Commercial Managed Care - HMO | Source: Ambulatory Visit | Attending: Critical Care Medicine | Admitting: Critical Care Medicine

## 2014-10-27 ENCOUNTER — Encounter: Payer: Self-pay | Admitting: Critical Care Medicine

## 2014-10-27 VITALS — BP 136/82 | HR 71 | Temp 97.5°F | Ht 68.0 in | Wt 169.6 lb

## 2014-10-27 DIAGNOSIS — J449 Chronic obstructive pulmonary disease, unspecified: Secondary | ICD-10-CM | POA: Diagnosis not present

## 2014-10-27 DIAGNOSIS — J441 Chronic obstructive pulmonary disease with (acute) exacerbation: Secondary | ICD-10-CM

## 2014-10-27 DIAGNOSIS — J189 Pneumonia, unspecified organism: Secondary | ICD-10-CM

## 2014-10-27 DIAGNOSIS — R05 Cough: Secondary | ICD-10-CM | POA: Diagnosis not present

## 2014-10-27 LAB — BASIC METABOLIC PANEL
BUN: 18 mg/dL (ref 6–23)
CHLORIDE: 106 meq/L (ref 96–112)
CO2: 28 mEq/L (ref 19–32)
Calcium: 9.2 mg/dL (ref 8.4–10.5)
Creatinine, Ser: 1.83 mg/dL — ABNORMAL HIGH (ref 0.40–1.50)
GFR: 45.87 mL/min — ABNORMAL LOW (ref >60.00)
Glucose, Bld: 91 mg/dL (ref 70–99)
Potassium: 4.9 mEq/L (ref 3.5–5.1)
SODIUM: 139 meq/L (ref 135–145)

## 2014-10-27 MED ORDER — ALBUTEROL SULFATE (2.5 MG/3ML) 0.083% IN NEBU: INHALATION_SOLUTION | RESPIRATORY_TRACT | Status: DC

## 2014-10-27 MED ORDER — ARFORMOTEROL TARTRATE 15 MCG/2ML IN NEBU: 15.0000 ug | INHALATION_SOLUTION | Freq: Two times a day (BID) | RESPIRATORY_TRACT | Status: DC

## 2014-10-27 NOTE — Progress Notes (Signed)
Subjective:    Patient ID: [name] [name], male    DOB: 05/11/1931, 81 y.o.   MRN: [id number]    History of Present Illness   11/27/2013 Chief Complaint  Patient presents with  . Follow-up    chest congestion, prod cough with yellow mucus, runny nose, PND, sneezing, increased DOE, and chest soreness.  Symptoms have gradually worsened since Dec.   Notes more cough, congestion, chest pain anterior chest sharp, yellow mucus, nasal congestion.   Notes some QHS dyspnea and wheezing.  Out of symbicort along. Expense issue.   rec Obstructive chronic bronchitis with exacerbation, Gold C Copd Gold C Copd with recurrent exacerbation, cannot afford HFA inhalers Plan Brovana twice daily in nebulizer via medicare DME > "too expensive" > stopped p one m Albuterol as needed New nebulizer machine  Overnight oxygen test      10/27/2014 Chief Complaint  Patient presents with  . [facility] Follow up    still has intermitant, sharp chest pain.  SOB unchanged.  Cough improved with yellow mucus.  No f/c/s.  Last seen 01/2014 ED 4 x in 6 months. ;  Just in [place] 3/24- 3/26 for CAP and CKD stage III and copd exac.  Symptoms on adm, more dyspnea, pain in chest , coughing, mucus excess yellow, no blood.  Mucus up into the throat.  On no oxygen.  Now: cough is better. Still sharp chest pain, worse with deep breath, worse with cough. Dyspnea is the same.  No visiting RN.  Never been to pulm rehab.  Stays active   Current Medications, Allergies, Complete Past Medical History, Past Surgical History, Family History, and Social History were reviewed in [employer] record.  ROS  The following are not active complaints unless bolded sore throat, dysphagia, dental problems, itching, sneezing,  nasal congestion or excess/ purulent secretions, ear ache,   fever, chills, sweats, unintended wt loss, classically pleuritic or exertional cp, hemoptysis,  orthopnea pnd or leg swelling, presyncope,  palpitations, heartburn, abdominal pain, anorexia, nausea, vomiting, diarrhea  or change in bowel or urinary habits, change in stools or urine, dysuria,hematuria,  rash, arthralgias, visual complaints, headache, numbness weakness or ataxia or problems with walking or coordination,  change in mood/affect or memory.           Objective:   Physical Exam BP 136/82 mmHg  Pulse 71  Temp(Src) 97.5 F (36.4 C) (Oral)  Ht 5\' 8"  (1.727 m)  Wt 169 lb 9.6 oz (76.93 kg)  BMI 25.79 kg/m2  SpO2 99%  amb bm nad  Wt Readings from Last 3 Encounters:  10/15/14 162 lb 4.1 oz (73.6 kg)  10/04/14 165 lb (74.844 kg)  09/28/14 165 lb (74.844 kg)      HEENT: Head is normocephalic and atraumatic. Extraocular muscles are intact. Pupils are equal, round, and reactive to light. Nares appeared normal. Mouth is well hydrated and without lesions. Mucous membranes are moist. Posterior pharynx clear of any exudate or lesions.   NECK: Supple. No carotid bruits. No lymphadenopathy or thyromegaly. LUNGS: Clear to auscultation. Diminished breath sounds, no wheezing. HEART: Regular rate and rhythm without murmur. Exquisite tenderness/ flinching with light touch anywhere near CABG scar. ABDOMEN: Soft, nontender, and nondistended. Positive bowel sounds. No hepatosplenomegaly was noted.  EXTREMITIES: Without any cyanosis, clubbing, rash, lesions or edema.  NEUROLOGIC: Cranial nerves II through XII are grossly intact.  PSYCHIATRIC: normal affect SKIN: No ulceration or induration present.  No results for input(s): NA, K, CL, CO2, BUN,  CREATININE, GLUCOSE in the last 168 hours. No results for input(s): HGB, HCT, WBC, PLT in the last 168 hours.   Lab Results  Component Value Date   PROBNP 152.4 04/16/2014     Recent Labs Lab 10/27/14 1056  NA 139  K 4.9  CL 106  CO2 28  GLUCOSE 91  BUN 18  CREATININE 1.83*  CALCIUM 9.2   Dg Chest 2 View  10/27/2014   CLINICAL DATA:  One month history of cough congestion and  chest tightness; episode of pneumonia 2 weeks ago ; history of COPD and CABG, remote history of tobacco use.  EXAM: CHEST  2 VIEW  COMPARISON:  Portable chest x-ray of October 15, 2014 and PA and lateral chest x-ray of September 28, 2014  FINDINGS: The lungs are well-expanded. The interstitial markings are minimally prominent at the left lung base. On the right there are coarse lung markings that are more conspicuous than on the previous study. There is thickening of the minor fissure which is stable. The heart is normal in size. The pulmonary vascularity is not engorged. There are 7 intact sternal wires from previous CABG. The bony thorax exhibits no acute abnormality.  IMPRESSION: Slight interval increase in the conspicuity of the pulmonary interstitial markings on the right. This may reflect acute bronchitis or pneumonitis superimposed upon interstitial lung disease. There is no alveolar pneumonia.  If the patient's clinical symptoms are worsening, chest CT scanning now would be useful.   Electronically Signed   By: David  Martinique   On: 10/27/2014 13:21        Assessment & Plan:   CAP (community acquired pneumonia) History of community acquired pneumonia with scarring residual in lung on chest x-ray No additional antibiotics indicated   Obstructive chronic bronchitis with exacerbation, Gold C Copd COPD gold stage C with frequent exacerbation and polypharmacy with recent community-acquired pneumonia now resolving Plan Enroll the patient into  COPD GOLD program today Start Brovana in the nebulizer twice daily Use albuterol as needed Stop nortryptiline as this is a high risk medication     Updated Medication List Outpatient Encounter Prescriptions as of 10/27/2014  Medication Sig  . acetaminophen (TYLENOL) 500 MG tablet Take 1,000 mg by mouth every 6 (six) hours as needed for headache.  . albuterol (PROVENTIL HFA;VENTOLIN HFA) 108 (90 BASE) MCG/ACT inhaler Inhale 2 puffs into the lungs every 4  (four) hours as needed for wheezing or shortness of breath.  Marland Kitchen albuterol (PROVENTIL) (2.5 MG/3ML) 0.083% nebulizer solution Take 1 vial (18mL) 3-4 times per day. AS NEEDED  . ALPRAZolam (XANAX) 0.5 MG tablet Take 1 tablet (0.5 mg total) by mouth daily. If needed (Patient taking differently: Take 0.5 mg by mouth daily as needed. )  . aspirin 325 MG tablet Take 325 mg by mouth daily.    Marland Kitchen azelastine (ASTELIN) 0.1 % nasal spray Place 1-2 sprays into both nostrils 2 (two) times daily. Use in each nostril as directed  . bisacodyl (DULCOLAX) 5 MG EC tablet Take 5 mg by mouth daily as needed for moderate constipation.  . calcitRIOL (ROCALTROL) 0.5 MCG capsule Take 0.5 mcg by mouth daily.  . Cholecalciferol (VITAMIN D PO) Take 1 tablet by mouth daily.  Marland Kitchen donepezil (ARICEPT) 10 MG tablet Take 1 tablet (10 mg total) by mouth at bedtime.  . feeding supplement, ENSURE ENLIVE, (ENSURE ENLIVE) LIQD Take 237 mLs by mouth 2 (two) times daily between meals.  . finasteride (PROSCAR) 5 MG tablet Take 5 mg by  mouth daily.   . fluticasone (FLONASE) 50 MCG/ACT nasal spray Place 2 sprays into both nostrils daily.  Marland Kitchen gabapentin (NEURONTIN) 300 MG capsule Take 1 capsule (300 mg total) by mouth 3 (three) times daily. (Patient taking differently: Take 300 mg by mouth 2 (two) times daily. )  . guaiFENesin-dextromethorphan (ROBITUSSIN DM) 100-10 MG/5ML syrup Take 5 mLs by mouth every 4 (four) hours as needed for cough.  . lansoprazole (PREVACID) 30 MG capsule Take 1 capsule by mouth daily.  Marland Kitchen latanoprost (XALATAN) 0.005 % ophthalmic solution Place 1 drop into both eyes at bedtime.   . Memantine HCl ER (NAMENDA XR) 28 MG CP24 TAKE ONE CAPSULE BY MOUTH ONCE DAILY. (Patient taking differently: Take 1 capsule by mouth daily. )  . metoprolol succinate (TOPROL XL) 25 MG 24 hr tablet Take 1 tablet (25 mg total) by mouth daily.  . mirtazapine (REMERON) 15 MG tablet Take 15 mg by mouth at bedtime.   . Multiple Vitamin (MULTIVITAMIN  WITH MINERALS) TABS tablet Take 1 tablet by mouth daily.  . Naphazoline-Glycerin (CLEAR EYES REDNESS RELIEF OP) Apply 1 drop to eye daily.  . nitroGLYCERIN (NITROSTAT) 0.4 MG SL tablet Place 1 tablet (0.4 mg total) under the tongue every 5 (five) minutes as needed for chest pain.  Marland Kitchen omeprazole (PRILOSEC) 40 MG capsule Take 40 mg by mouth every evening.   . Tamsulosin HCl (FLOMAX) 0.4 MG CAPS Take 0.4 mg by mouth at bedtime.   . [DISCONTINUED] albuterol (PROVENTIL) (2.5 MG/3ML) 0.083% nebulizer solution Take 1 vial (35mL) 3-4 times per day. (Patient taking differently: Take 2.5 mg by nebulization 2 (two) times daily. Take 1 vial (74mL) 3-4 times per day.)  . [DISCONTINUED] nortriptyline (PAMELOR) 10 MG capsule Take 20 mg by mouth at bedtime.  Marland Kitchen arformoterol (BROVANA) 15 MCG/2ML NEBU Take 2 mLs (15 mcg total) by nebulization 2 (two) times daily.

## 2014-10-27 NOTE — Patient Instructions (Addendum)
We will enroll you in the COPD GOLD program today Start Brovana in the nebulizer twice daily Use albuterol as needed Stop nortryptiline Lab and cxr today Follow up with [name] P, NP in 1 month and with Dr. Joya Gaskins in 2 months

## 2014-10-28 ENCOUNTER — Telehealth: Payer: Self-pay | Admitting: Neurology

## 2014-10-28 ENCOUNTER — Encounter: Payer: Self-pay | Admitting: Neurology

## 2014-10-28 ENCOUNTER — Ambulatory Visit (INDEPENDENT_AMBULATORY_CARE_PROVIDER_SITE_OTHER): Payer: Commercial Managed Care - HMO | Admitting: Neurology

## 2014-10-28 VITALS — BP 147/82 | HR 78 | Ht 68.0 in | Wt 165.0 lb

## 2014-10-28 DIAGNOSIS — F0280 Dementia in other diseases classified elsewhere without behavioral disturbance: Secondary | ICD-10-CM

## 2014-10-28 DIAGNOSIS — R202 Paresthesia of skin: Secondary | ICD-10-CM | POA: Diagnosis not present

## 2014-10-28 DIAGNOSIS — G309 Alzheimer's disease, unspecified: Secondary | ICD-10-CM

## 2014-10-28 NOTE — Telephone Encounter (Signed)
[name] [name], I saw patient today, wife reported that pharmaceutical has sent his Namenda xr 28 mg, he is at medication assistant program toe office, would you please check on this, and contact his family.

## 2014-10-28 NOTE — Progress Notes (Signed)
PATIENT: [name] [name] DOB: 09/12/1930  HISTORICAL  KENYATTE CHATMON  is a 81 years old right-handed African American male, accompanied by his wife at today's clinical visit, he was a patient of Dr. Jules Husbands since February 2009, for evaluation of memory trouble, his primary care physician is Dr. Ivery Quale.  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 Some anxiety, improved on Remeron and prn Xanax.   He is occasionally irritable and easily provoked to anger. Wife  says that he has decreased appetite recently.  UPDATE Sept 19th 2014: He continues to complain mild slow worsening memory trouble, still very active at home, Today Mini-Mental Status is 23 out of 30, he wants more aggressive treatment, I have discussed with him the LZAX trial, he decided to proceed with it. I will also start Namenda 28 mammogram every day.  UPDATE June 2nd 2015: He now came in complaining of bilateral feet and hands paresthesia, which is new since Feb 2015, his symptoms are getting worse, he could hardly picking up things, he also complains of mild gait difficulty, low back pain, no bowel and bladder incontinence,   Recent laboratory evaluation in May 21st 2013, showed mild anemia, hemoglobin 11.8, glucose 96, B12 1004, normal TSH, protein  electrophoresis showed elevated M spike a 1.3, mild increased of creatinine 1.57, with GFR of 33, normal liver function tests previously. He is going to be evaluated by hematologist Dr. Judeen Hammans soon.  He also complains of low back pain, no significant neck pain,  UPDATE July 6th 2015: He was evaluated by Dr. Benay Spice for chronic monoclonal IgG lambda protein. There is no clinical evidence of progression to multiple myeloma.  The M spike has not changed significantly over many years.  He still has numbness tinglings in his fingers, even to his arms and chest, frightening to him. We have reviewed MRI lumbar together, L5-S1: pseudo-disc bulging and facet hypertrophy with mild-moderate right and moderate left foraminal stenosis. L3-4: disc bulging and facet hypertrophy with mild biforaminal stenosis. L4-5: pseudo-disc bulging and facet hypertrophy with mild biforaminal stenosis.  notable for left renal cyst (5.7cm).   MRI cervical spine (without) demonstrating: C5-6: disc bulging and uncovertebral joint hypertrophy with severe right and moderate left foraminal stenosis.  Disc bulging and spondylosis from C4-5 to T2-3.  He has history of chronic renal disease, is going to be see by his nephrologist Dr. Posey Pronto in August 2015, I have suggested continued followup with his incidental finding of a left renal cyst with his nephrologist Dr. Posey Pronto  UPDATE April 7th 2016:  he continue has slow worsening memory trouble, taking Namenda xr 28 mg, Aricept 10 mg daily, COPD, recovering from pneumonia,  Complains of excessive sleepiness,   He is not eating well, he has bad taste in his mouth,  Ambulate without difficulty, he has stopped nortriptyline,  taking gabapentin for lower extremity pain,  REVIEW OF SYSTEMS: Full 14 system review of systems performed and notable only for  Ringing ears, running nose, appetite change, shortness of breath, chest tightness, chest pain,  Excessive thirst, constipation, nausea, sleep  talking, frequent infection, back pain, anemia, memory loss, agitation, decreased concentration, anxiety, hallucinations  ALLERGIES: No Known Allergies  HOME MEDICATIONS: Current Outpatient Prescriptions  Medication Sig Dispense Refill  . acetaminophen (TYLENOL) 500 MG tablet Take 1,000 mg by mouth every 6 (six) hours as needed for headache.    . albuterol (PROVENTIL HFA;VENTOLIN HFA) 108 (90 BASE) MCG/ACT inhaler Inhale 2 puffs into the lungs every 4 (four) hours as needed for wheezing or shortness of breath. 1 Inhaler 0  . albuterol (PROVENTIL) (2.5 MG/3ML) 0.083% nebulizer solution Take 1 vial (33m) 3-4 times per day. AS NEEDED 1080 mL 1  . ALPRAZolam (XANAX) 0.5 MG tablet Take 1 tablet (0.5 mg total) by mouth daily. If needed (Patient taking differently: Take 0.5 mg by mouth daily as needed. ) 90 tablet 1  . arformoterol (BROVANA) 15 MCG/2ML NEBU Take 2 mLs (15 mcg total) by nebulization 2 (two) times daily. 120 mL 6  . aspirin 325 MG tablet Take 325 mg by mouth daily.      .Marland Kitchenazelastine (ASTELIN) 0.1 % nasal spray Place 1-2 sprays into both nostrils 2 (two) times daily. Use in each nostril as directed    . bisacodyl (DULCOLAX) 5 MG EC tablet Take 5 mg by mouth daily as needed for moderate constipation.    . calcitRIOL (ROCALTROL) 0.5 MCG capsule Take 0.5 mcg by mouth daily.    . Cholecalciferol (VITAMIN D PO) Take 1 tablet by mouth daily.    .Marland Kitchendonepezil (ARICEPT) 10 MG tablet Take 1 tablet (10 mg total) by mouth at bedtime. 90 tablet 3  . feeding supplement, ENSURE ENLIVE, (ENSURE ENLIVE) LIQD Take 237 mLs by mouth 2 (two) times daily between meals. 30 Bottle 6  . finasteride (PROSCAR) 5 MG tablet Take 5 mg by mouth daily.     . fluticasone (FLONASE) 50 MCG/ACT nasal spray Place 2 sprays into both nostrils daily.    .Marland Kitchengabapentin (NEURONTIN) 300 MG capsule Take 1 capsule (300 mg total) by mouth 3 (three) times daily. (Patient taking differently: Take 300 mg by mouth 2 (two) times daily. )  270 capsule 3  . guaiFENesin-dextromethorphan (ROBITUSSIN DM) 100-10 MG/5ML syrup Take 5 mLs by mouth every 4 (four) hours as needed for cough. 118 mL 0  . lansoprazole (PREVACID) 30 MG capsule Take 1 capsule by mouth daily.    .Marland Kitchenlatanoprost (XALATAN) 0.005 % ophthalmic solution Place 1 drop into both eyes at bedtime.     . Memantine HCl ER (NAMENDA XR) 28 MG CP24 TAKE ONE CAPSULE BY MOUTH ONCE DAILY. (Patient taking differently: Take 1 capsule by mouth daily. ) 90 capsule 1  . metoprolol succinate (TOPROL XL) 25 MG 24 hr tablet Take 1 tablet (25 mg total) by mouth daily. 30 tablet 6  . mirtazapine (REMERON) 15 MG tablet Take 15 mg by mouth at bedtime.     . Multiple Vitamin (MULTIVITAMIN WITH MINERALS) TABS tablet Take 1 tablet by mouth daily.    . Naphazoline-Glycerin (CLEAR EYES REDNESS RELIEF OP) Apply 1 drop to eye daily.    . nitroGLYCERIN (NITROSTAT) 0.4 MG SL tablet Place 1 tablet (0.4 mg total) under the tongue every 5 (five) minutes as needed for chest pain. 90 tablet 3  . omeprazole (PRILOSEC)  40 MG capsule Take 40 mg by mouth every evening.     . Tamsulosin HCl (FLOMAX) 0.4 MG CAPS Take 0.4 mg by mouth at bedtime.      No current facility-administered medications for this visit.    PAST MEDICAL HISTORY: Past Medical History  Diagnosis Date  . PVD (peripheral vascular disease)     s/p left renal artery stent and left iliac stent  . Hypertension   . Hyperlipemia   . GERD (gastroesophageal reflux disease)   . COPD (chronic obstructive pulmonary disease)   . Monoclonal gammopathy   . BPH (benign prostatic hyperplasia)   . DEMENTIA   . Alzheimer disease   . Memory loss   . CAD (coronary artery disease)     s/p CABG in 1997 with known atretic LIMA to LAD and 40% LM.    Marland Kitchen Chronic kidney disease (CKD), stage III (moderate)   . Carotid artery stenosis     s/p left CEA.  Carotid dopplers 09/2013 60-79% right ICA stenosis and 1-39% left ICA stenosis of left CEA  . Chronic chest  wall pain     PAST SURGICAL HISTORY: Past Surgical History  Procedure Laterality Date  . Coronary artery bypass graft  1997  . Left cea  2002  . Back surgery  2011  . Cataract extraction w/phaco  09/18/2011    Procedure: CATARACT EXTRACTION PHACO AND INTRAOCULAR LENS PLACEMENT (IOC);  Surgeon: Elta Guadeloupe T. Gershon Crane, MD;  Location: [facility];  Service: Ophthalmology;  Laterality: Right;  CDE: 6.19  . Cardiac surgery      FAMILY HISTORY: Family History  Problem Relation Age of Onset  . Anesthesia problems Neg Hx   . Hypotension Neg Hx   . Malignant hyperthermia Neg Hx   . Pseudochol deficiency Neg Hx   . High blood pressure    . High Cholesterol    . Hypertension Father     SOCIAL HISTORY:  History   Social History  . Marital Status: Married    Spouse Name: Peter Congo  . Number of Children: 8  . Years of Education: 13   Occupational History  .      Retired   Social History Main Topics  . Smoking status: Former Smoker -- 0.50 packs/day for 40 years    Types: Cigarettes    Quit date: 07/23/1978  . Smokeless tobacco: Never Used  . Alcohol Use: No  . Drug Use: No  . Sexual Activity: Not on file   Other Topics Concern  . Not on file   Social History Narrative   Patient lives at home with his wife. Peter Congo). Patient is retired. Patient has one Year of college.   Right handed.   Caffeine- sometimes     PHYSICAL EXAM   Filed Vitals:   10/28/14 1652  BP: 147/82  Pulse: 78  Height: $Remove'5\' 8"'IQPoTPf$  (1.727 m)  Weight: 165 lb (74.844 kg)    Not recorded      Body mass index is 25.09 kg/(m^2).  PHYSICAL EXAMNIATION:  Gen: NAD, conversant, well nourised, obese, well groomed                     Cardiovascular: Regular rate rhythm, no peripheral edema, warm, nontender. Eyes: Conjunctivae clear without exudates or hemorrhage Neck: Supple, no carotid bruise. Pulmonary: Clear to auscultation bilaterally   NEUROLOGICAL EXAM:  MENTAL STATUS: Speech:    Speech is normal; fluent  and spontaneous with normal comprehension.  Cognition:   Mini-Mental Status Examination  is 40 out of 30, he is not oriented to time, place, missed 2 out of 3 recalls, has difficulty copy design, could not spell word backwards,   CRANIAL NERVES: CN II: Visual fields are full to confrontation. Fundoscopic exam is normal with sharp discs and no vascular changes. Venous pulsations are present bilaterally. Pupils are 4 mm and briskly reactive to light. Visual acuity is 20/20 bilaterally. CN III, IV, VI: extraocular movement are normal. No ptosis. CN V: Facial sensation is intact to pinprick in all 3 divisions bilaterally. Corneal responses are intact.  CN VII: Face is symmetric with normal eye closure and smile. CN VIII: Hearing is normal to rubbing fingers CN IX, X: Palate elevates symmetrically. Phonation is normal. CN XI: Head turning and shoulder shrug are intact CN XII: Tongue is midline with normal movements and no atrophy.  MOTOR: There is no pronator drift of out-stretched arms. Muscle bulk and tone are normal. Muscle strength is normal.   Shoulder abduction Shoulder external rotation Elbow flexion Elbow extension Wrist flexion Wrist extension Finger abduction Hip flexion Knee flexion Knee extension Ankle dorsi flexion Ankle plantar flexion  R _0 L _1 REFLEXES: Reflexes are 2+ and symmetric at the biceps, triceps, knees, and ankles. Plantar responses are flexor.  SENSORY:  intact to light touch  COORDINATION: Rapid alternating movements and fine finger movements are intact. There is no dysmetria on finger-to-nose and heel-knee-shin. There are no abnormal or extraneous movements.   GAIT/STANCE:  stooped forward,cautious,   DIAGNOSTIC DATA (LABS, IMAGING, TESTING) - I reviewed patient records, labs, notes, testing and imaging myself where available.  Lab Results  Component Value Date   WBC 7.2 10/14/2014   HGB 11.6* 10/14/2014    HCT 34.3* 10/14/2014   MCV 100.6* 10/14/2014   PLT 203 10/14/2014      Component Value Date/Time   NA 139 10/27/2014 1056   NA 142 12/30/2013 0958   K 4.9 10/27/2014 1056   K 5.2* 12/30/2013 0958   CL 106 10/27/2014 1056   CL 111* 04/14/2012 1049   CO2 28 10/27/2014 1056   CO2 26 12/30/2013 0958   GLUCOSE 91 10/27/2014 1056   GLUCOSE 97 12/30/2013 0958   GLUCOSE 99 04/14/2012 1049   BUN 18 10/27/2014 1056   BUN 19.8 12/30/2013 0958   CREATININE 1.83* 10/27/2014 1056   CREATININE 2.1* 12/30/2013 0958   CALCIUM 9.2 10/27/2014 1056   CALCIUM 9.5 12/30/2013 0958   PROT 7.0 10/14/2014 2243   PROT 8.1 12/30/2013 0958   PROT 7.3 12/22/2013 1305   ALBUMIN 3.4* 10/14/2014 2243   ALBUMIN 3.8 12/30/2013 0958   AST 21 10/14/2014 2243   AST 28 12/30/2013 0958   ALT 15 10/14/2014 2243   ALT 19 12/30/2013 0958   ALKPHOS 56 10/14/2014 2243   ALKPHOS 58 12/30/2013 0958   BILITOT 0.3 10/14/2014 2243   BILITOT 0.27 12/30/2013 0958   GFRNONAA 30* 10/16/2014 0608   GFRAA 35* 10/16/2014 0608   No results found for: CHOL, HDL, LDLCALC, LDLDIRECT, TRIG, CHOLHDL Lab Results  Component Value Date   HGBA1C 6.5* 12/22/2013   No results found for: [id number] Lab Results  Component Value Date   TSH 0.62 01/27/2014      ASSESSMENT AND PLAN  DONYA TOMARO is a 81 y.o. male      1,  Dementia,  Mini-Mental Status Examination  14 out of 30 today,  Continue Namenda xr  28 mg , Aricept 10 mg daily  2,  Bilateral lower extremity paresthesia,  electrodiagnostic study showed no evidence of large fiber peripheral neuropathy, lumbar  And cervical degenerative disc disease, likely contribute to his complains   MRI lumbar,  L5-S1: pseudo-disc bulging and facet hypertrophy with mild-moderate right and moderate left foraminal stenosis. L3-4: disc bulging and facet hypertrophy with mild biforaminal stenosis. L4-5: pseudo-disc bulging and facet hypertrophy with mild biforaminal stenosis MRI cervical spine:  C5-6: disc bulging and uncovertebral joint hypertrophy with severe right and moderate left foraminal stenosis. Disc bulging and spondylosis from C4-5 to T2-3.   he complains of worsening memory trouble, also has kidney disease, I have stopped gabapentine, nortriptline   return to clinic in 6 months with Rhae Hammock, M.D. Ph.D.  [facility] [address] [place], [place] [address] Ph: [phone] Fax: [phone]

## 2014-10-28 NOTE — Assessment & Plan Note (Signed)
COPD gold stage C with frequent exacerbation and polypharmacy with recent community-acquired pneumonia now resolving Plan Enroll the patient into  COPD GOLD program today Start Brovana in the nebulizer twice daily Use albuterol as needed Stop nortryptiline as this is a high risk medication

## 2014-10-28 NOTE — Assessment & Plan Note (Signed)
History of community acquired pneumonia with scarring residual in lung on chest x-ray No additional antibiotics indicated

## 2014-10-28 NOTE — Telephone Encounter (Signed)
I am checking with Larey Seat regarding this.

## 2014-10-29 NOTE — Progress Notes (Signed)
Quick Note:  Called, spoke with pt's wife. Discussed cxr results and recs per Dr. Joya Gaskins. She verbalized understanding and will inform pt. ______

## 2014-10-29 NOTE — Progress Notes (Signed)
Quick Note:  Called, spoke with pt's wife. Discussed lab results per Dr .Joya Gaskins. She verbalized understanding and will inform pt. ______

## 2014-11-01 DIAGNOSIS — K219 Gastro-esophageal reflux disease without esophagitis: Secondary | ICD-10-CM | POA: Diagnosis not present

## 2014-11-01 DIAGNOSIS — K3184 Gastroparesis: Secondary | ICD-10-CM | POA: Diagnosis not present

## 2014-11-01 DIAGNOSIS — J9610 Chronic respiratory failure, unspecified whether with hypoxia or hypercapnia: Secondary | ICD-10-CM | POA: Diagnosis not present

## 2014-11-01 DIAGNOSIS — R140 Abdominal distension (gaseous): Secondary | ICD-10-CM | POA: Diagnosis not present

## 2014-11-01 NOTE — Telephone Encounter (Signed)
[name] [name] verified the medication has been ready for pick up at the front desk since 03/04.  I called back.  Got no answer on home line, left message.  Tried cell as well, but got no answer and VM had not been set up.  Unable to leave message on this line.

## 2014-11-04 ENCOUNTER — Telehealth: Payer: Self-pay | Admitting: *Deleted

## 2014-11-04 NOTE — Telephone Encounter (Signed)
Received PA request for [employer] now request authorization Auth. Started through [employer]

## 2014-11-08 ENCOUNTER — Telehealth: Payer: Self-pay | Admitting: Critical Care Medicine

## 2014-11-08 DIAGNOSIS — J189 Pneumonia, unspecified organism: Secondary | ICD-10-CM

## 2014-11-08 NOTE — Telephone Encounter (Signed)
Received denial from [employer] for [name] [name] was denied under part D Medication is approved thru part [place] and asked that they run thru part B Ref # [id number] Patient contacted.

## 2014-11-08 NOTE — Telephone Encounter (Signed)
Order has been placed for nebulizer kits. Pt's wife is aware. Nothing further was needed.

## 2014-11-11 ENCOUNTER — Ambulatory Visit: Payer: Commercial Managed Care - HMO | Admitting: Neurology

## 2014-11-23 DIAGNOSIS — H5210 Myopia, unspecified eye: Secondary | ICD-10-CM | POA: Diagnosis not present

## 2014-11-23 DIAGNOSIS — H524 Presbyopia: Secondary | ICD-10-CM | POA: Diagnosis not present

## 2014-11-26 ENCOUNTER — Encounter: Payer: Self-pay | Admitting: Adult Health

## 2014-11-26 ENCOUNTER — Ambulatory Visit (INDEPENDENT_AMBULATORY_CARE_PROVIDER_SITE_OTHER): Payer: Commercial Managed Care - HMO | Admitting: Adult Health

## 2014-11-26 VITALS — BP 122/70 | HR 62 | Temp 97.6°F | Ht 68.0 in | Wt 168.8 lb

## 2014-11-26 DIAGNOSIS — J441 Chronic obstructive pulmonary disease with (acute) exacerbation: Secondary | ICD-10-CM | POA: Diagnosis not present

## 2014-11-26 NOTE — Progress Notes (Signed)
   Subjective:    Patient ID: [name] [name], male    DOB: 1931-01-28, 81 y.o.   MRN: [id number]  HPI 81 year old male former smoker with COPD Gold C COPD GOLD CARD HOLDER.   11/26/2014 Follow up : COPD  Pt returns for 1 month follow up  Reports breathing is essentially unchaged No flare in cough or wheezing .  Gets winded easily.  Wife helps with his meds.  Has clear nasal drip .   Had dementia on namenda and aricept.   Review of Systems Constitutional:   No  weight loss, night sweats,  Fevers, chills, fatigue, or  lassitude.  HEENT:   No headaches,  Difficulty swallowing,  Tooth/dental problems, or  Sore throat,                No sneezing, itching, ear ache,  +nasal congestion, post nasal drip,   CV:  No chest pain,  Orthopnea, PND, swelling in lower extremities, anasarca, dizziness, palpitations, syncope.   GI  No heartburn, indigestion, abdominal pain, nausea, vomiting, diarrhea, change in bowel habits, loss of appetite, bloody stools.   Resp.  No chest wall deformity  Skin: no rash or lesions.  GU: no dysuria, change in color of urine, no urgency or frequency.  No flank pain, no hematuria   MS:  No joint pain or swelling.  No decreased range of motion.  No back pain.  Psych:  No change in mood or affect. No depression or anxiety.  + memory loss.    ]    Objective:   Physical Exam GEN: A/Ox3; pleasant , NAD, elderly   HEENT:  [place]/AT,  EACs-clear, TMs-wnl, NOSE-clear, THROAT-clear, no lesions, no postnasal drip or exudate noted.   NECK:  Supple w/ fair ROM; no JVD; normal carotid impulses w/o bruits; no thyromegaly or nodules palpated; no lymphadenopathy.  RESP  Decreased BS in bases  .no accessory muscle use, no dullness to percussion  CARD:  RRR, no m/r/g  , no peripheral edema, pulses intact, no cyanosis or clubbing.  GI:   Soft & nt; nml bowel sounds; no organomegaly or masses detected.  Musco: Warm bil, no deformities or joint swelling noted.   Neuro:  alert, no focal deficits noted.    Skin: Warm, no lesions or rashes         Assessment & Plan:

## 2014-11-26 NOTE — Patient Instructions (Signed)
Continue on current regimen  May use Mucinex DM Twice daily  As needed  Cough and congestion  Saline nasal rinses As needed   Follow up Dr. Joya Gaskins  In 2 months and As needed

## 2014-11-30 ENCOUNTER — Telehealth: Payer: Self-pay

## 2014-11-30 NOTE — Telephone Encounter (Signed)
Called patient to inform patient asst med ready for pick up at front desk. Spoke to spouse. Spouse verbalized understanding, said will p/u med at upcoming appt Monday.

## 2014-12-02 NOTE — Assessment & Plan Note (Signed)
Compensated on present regimen   Plan  Continue on current regimen  May use Mucinex DM Twice daily  As needed  Cough and congestion  Saline nasal rinses As needed   Follow up Dr. Joya Gaskins  In 2 months and As needed

## 2014-12-06 ENCOUNTER — Other Ambulatory Visit: Payer: Self-pay | Admitting: Neurology

## 2014-12-06 DIAGNOSIS — F0390 Unspecified dementia without behavioral disturbance: Secondary | ICD-10-CM | POA: Diagnosis not present

## 2014-12-06 DIAGNOSIS — K219 Gastro-esophageal reflux disease without esophagitis: Secondary | ICD-10-CM | POA: Diagnosis not present

## 2014-12-06 DIAGNOSIS — F3340 Major depressive disorder, recurrent, in remission, unspecified: Secondary | ICD-10-CM | POA: Diagnosis not present

## 2014-12-06 DIAGNOSIS — K3184 Gastroparesis: Secondary | ICD-10-CM | POA: Diagnosis not present

## 2014-12-09 ENCOUNTER — Telehealth: Payer: Self-pay

## 2014-12-09 ENCOUNTER — Ambulatory Visit (INDEPENDENT_AMBULATORY_CARE_PROVIDER_SITE_OTHER): Payer: Commercial Managed Care - HMO | Admitting: Cardiology

## 2014-12-09 ENCOUNTER — Encounter: Payer: Self-pay | Admitting: Cardiology

## 2014-12-09 VITALS — BP 138/78 | HR 59 | Ht 68.0 in | Wt 168.0 lb

## 2014-12-09 DIAGNOSIS — E785 Hyperlipidemia, unspecified: Secondary | ICD-10-CM | POA: Diagnosis not present

## 2014-12-09 DIAGNOSIS — I739 Peripheral vascular disease, unspecified: Secondary | ICD-10-CM

## 2014-12-09 DIAGNOSIS — I10 Essential (primary) hypertension: Secondary | ICD-10-CM | POA: Diagnosis not present

## 2014-12-09 DIAGNOSIS — I2510 Atherosclerotic heart disease of native coronary artery without angina pectoris: Secondary | ICD-10-CM

## 2014-12-09 DIAGNOSIS — I6529 Occlusion and stenosis of unspecified carotid artery: Secondary | ICD-10-CM

## 2014-12-09 LAB — BASIC METABOLIC PANEL
BUN: 19 mg/dL (ref 6–23)
CHLORIDE: 104 meq/L (ref 96–112)
CO2: 29 mEq/L (ref 19–32)
Calcium: 9.6 mg/dL (ref 8.4–10.5)
Creatinine, Ser: 1.92 mg/dL — ABNORMAL HIGH (ref 0.40–1.50)
GFR: 43.38 mL/min — AB (ref >60.00)
Glucose, Bld: 85 mg/dL (ref 70–99)
POTASSIUM: 5 meq/L (ref 3.5–5.1)
SODIUM: 137 meq/L (ref 135–145)

## 2014-12-09 LAB — HEPATIC FUNCTION PANEL
ALT: 14 U/L (ref 0–53)
AST: 25 U/L (ref 0–37)
Albumin: 4 g/dL (ref 3.5–5.2)
Alkaline Phosphatase: 60 U/L (ref 39–117)
BILIRUBIN DIRECT: 0.2 mg/dL (ref 0.0–0.3)
TOTAL PROTEIN: 8.1 g/dL (ref 6.0–8.3)
Total Bilirubin: 0.4 mg/dL (ref 0.2–1.2)

## 2014-12-09 LAB — LIPID PANEL
Cholesterol: 213 mg/dL — ABNORMAL HIGH (ref 0–200)
HDL: 34.5 mg/dL — AB (ref >39.00)
NonHDL: 178.5
Total CHOL/HDL Ratio: 6
Triglycerides: 238 mg/dL — ABNORMAL HIGH (ref 0.0–149.0)
VLDL: 47.6 mg/dL — ABNORMAL HIGH (ref 0.0–40.0)

## 2014-12-09 LAB — LDL CHOLESTEROL, DIRECT: Direct LDL: 110 mg/dL

## 2014-12-09 NOTE — Telephone Encounter (Signed)
-----   Message from Sueanne Margarita, MD sent at 12/09/2014  5:11 PM EDT ----- LDL not at goal - refer to lipid clinic

## 2014-12-09 NOTE — Addendum Note (Signed)
Addended by: Harland German A on: 12/09/2014 09:42 AM   Modules accepted: Orders

## 2014-12-09 NOTE — Patient Instructions (Addendum)
Medication Instructions:  Your physician recommends that you continue on your current medications as directed. Please refer to the Current Medication list given to you today.   Labwork: TODAY: BMET, LFTs, Lipids  Testing/Procedures: Your physician has requested that you have a carotid duplex in 4 months. This test is an ultrasound of the carotid arteries in your neck. It looks at blood flow through these arteries that supply the brain with blood. Allow one hour for this exam. There are no restrictions or special instructions.  Follow-Up: Your physician recommends that you schedule a follow-up appointment with Dr. Kellie Simmering for carotid stenosis.  Your physician wants you to follow-up in: 6 months with Dr. Radford Pax. You will receive a reminder letter in the mail two months in advance. If you don't receive a letter, please call our office to schedule the follow-up appointment.   Any Other Special Instructions Will Be Listed Below (If Applicable).

## 2014-12-09 NOTE — Progress Notes (Signed)
Cardiology Office Note   Date:  12/09/2014   ID:  [name] [name], DOB 1930-12-25, MRN [id number]  PCP:  Gara Kroner, MD  Cardiologist:   Sueanne Margarita, MD   No chief complaint on file.     History of Present Illness: [name] [name] is a 81 y.o. male who presents for followup of his ASCAD. He has a history of ASCAD s/p CABG in 1997 with known atretic LIMA to LAD and 40% ostial LM, PVD s/p left iliac stent and renal artery stenosis s/p left renal artery stent, carotid artery stenosis s/p left CEA, dementia, HTN, CKD, noncardiac chest wall pain, dyslipidemia and COPD. Nuclear stress test in 2013 showed no ischemia.When I last saw him he was complaining of chest pain and underwent stress testing which showed no ischemia.  2D echo showed moderate pulmonary HTN from COPD and low normal LVF.  His BP was poorly controlled and he was started on Toprol.  He says that he is still having problems with chest pain.  He describes it as a sharp sticking pain.  He has been having problems with pleghm coming up in his mouth.  He was hospitalized in March with PNA and still has some SOB from that.  He denies any LE edema.      Past Medical History  Diagnosis Date  . PVD (peripheral vascular disease)     s/p left renal artery stent and left iliac stent  . Hypertension   . Hyperlipemia   . GERD (gastroesophageal reflux disease)   . COPD (chronic obstructive pulmonary disease)   . Monoclonal gammopathy   . BPH (benign prostatic hyperplasia)   . DEMENTIA   . Alzheimer disease   . Memory loss   . CAD (coronary artery disease)     s/p CABG in 1997 with known atretic LIMA to LAD and 40% LM.    Marland Kitchen Chronic kidney disease (CKD), stage III (moderate)   . Carotid artery stenosis     s/p left CEA.  Carotid dopplers 09/2013 60-79% right ICA stenosis and 1-39% left ICA stenosis of left CEA  . Chronic chest wall pain   . Peptic ulcer disease   . Depression with anxiety   . Retinal artery occlusion      Past Surgical History  Procedure Laterality Date  . Coronary artery bypass graft  1997  . Left cea  2002  . Back surgery  2011  . Cataract extraction w/phaco  09/18/2011    Procedure: CATARACT EXTRACTION PHACO AND INTRAOCULAR LENS PLACEMENT (IOC);  Surgeon: Elta Guadeloupe T. Gershon Crane, MD;  Location: [facility];  Service: Ophthalmology;  Laterality: Right;  CDE: 6.19  . Cardiac surgery    . Renal artery stent       Current Outpatient Prescriptions  Medication Sig Dispense Refill  . acetaminophen (TYLENOL) 500 MG tablet Take 1,000 mg by mouth every 6 (six) hours as needed for headache.    . albuterol (PROVENTIL HFA;VENTOLIN HFA) 108 (90 BASE) MCG/ACT inhaler Inhale 2 puffs into the lungs every 4 (four) hours as needed for wheezing or shortness of breath. 1 Inhaler 0  . albuterol (PROVENTIL) (2.5 MG/3ML) 0.083% nebulizer solution Take 1 vial (1mL) 3-4 times per day. AS NEEDED 1080 mL 1  . ALPRAZolam (XANAX) 0.5 MG tablet Take 1 tablet (0.5 mg total) by mouth daily. If needed (Patient taking differently: Take 0.5 mg by mouth daily as needed. ) 90 tablet 1  . arformoterol (BROVANA) 15 MCG/2ML NEBU Take 2 mLs (  15 mcg total) by nebulization 2 (two) times daily. 120 mL 6  . aspirin 325 MG tablet Take 325 mg by mouth daily.      Marland Kitchen azelastine (ASTELIN) 0.1 % nasal spray Place 1-2 sprays into both nostrils 2 (two) times daily. Use in each nostril as directed    . bisacodyl (DULCOLAX) 5 MG EC tablet Take 5 mg by mouth daily as needed for moderate constipation.    . calcitRIOL (ROCALTROL) 0.5 MCG capsule Take 0.5 mcg by mouth daily.    . Cholecalciferol (VITAMIN D PO) Take 1 tablet by mouth daily. 2000 units    . donepezil (ARICEPT) 10 MG tablet TAKE 1 TABLET AT BEDTIME 90 tablet 1  . feeding supplement, ENSURE ENLIVE, (ENSURE ENLIVE) LIQD Take 237 mLs by mouth 2 (two) times daily between meals. 30 Bottle 6  . finasteride (PROSCAR) 5 MG tablet Take 5 mg by mouth daily.     . fluticasone (FLONASE) 50 MCG/ACT  nasal spray Place 2 sprays into both nostrils daily.    Marland Kitchen gabapentin (NEURONTIN) 300 MG capsule Take 1 capsule (300 mg total) by mouth 3 (three) times daily. (Patient taking differently: Take 300 mg by mouth 2 (two) times daily. ) 270 capsule 3  . guaiFENesin-dextromethorphan (ROBITUSSIN DM) 100-10 MG/5ML syrup Take 5 mLs by mouth every 4 (four) hours as needed for cough. 118 mL 0  . lansoprazole (PREVACID) 30 MG capsule Take 1 capsule by mouth daily.    Marland Kitchen latanoprost (XALATAN) 0.005 % ophthalmic solution Place 1 drop into both eyes at bedtime.     . Memantine HCl ER (NAMENDA XR) 28 MG CP24 TAKE ONE CAPSULE BY MOUTH ONCE DAILY. (Patient taking differently: Take 1 capsule by mouth daily. ) 90 capsule 1  . metoCLOPramide (REGLAN) 10 MG tablet Take 10 mg by mouth 2 (two) times daily.    . metoprolol succinate (TOPROL XL) 25 MG 24 hr tablet Take 1 tablet (25 mg total) by mouth daily. 30 tablet 6  . mirtazapine (REMERON) 15 MG tablet Take 15 mg by mouth at bedtime.     . mometasone (NASONEX) 50 MCG/ACT nasal spray Place 2 sprays into the nose daily.    . Multiple Vitamin (MULTIVITAMIN WITH MINERALS) TABS tablet Take 1 tablet by mouth daily.    . Naphazoline-Glycerin (CLEAR EYES REDNESS RELIEF OP) Apply 1 drop to eye daily.    . nitroGLYCERIN (NITROSTAT) 0.4 MG SL tablet Place 1 tablet (0.4 mg total) under the tongue every 5 (five) minutes as needed for chest pain. 90 tablet 3  . nortriptyline (PAMELOR) 10 MG capsule Take 10 mg by mouth 2 (two) times daily. Two capsules per day    . omeprazole (PRILOSEC) 40 MG capsule Take 40 mg by mouth every evening.     . polyethylene glycol (MIRALAX / GLYCOLAX) packet Take 17 g by mouth daily.    . promethazine (PHENERGAN) 25 MG tablet Take 25 mg by mouth every 6 (six) hours as needed for nausea or vomiting.    . Tamsulosin HCl (FLOMAX) 0.4 MG CAPS Take 0.4 mg by mouth daily.      No current facility-administered medications for this visit.    Allergies:   Review  of patient's allergies indicates no known allergies.    Social History:  The patient  reports that he quit smoking about 36 years ago. His smoking use included Cigarettes. He has a 20 pack-year smoking history. He has never used smokeless tobacco. He reports that he does not  drink alcohol or use illicit drugs.   Family History:  The patient's family history includes High Cholesterol in an other family member; High blood pressure in an other family member; Hypertension in his father and mother. There is no history of Anesthesia problems, Hypotension, Malignant hyperthermia, or Pseudochol deficiency.    ROS:  Please see the history of present illness.   Otherwise, review of systems are positive for none.   All other systems are reviewed and negative.    PHYSICAL EXAM: VS:  BP 148/78 mmHg  Pulse 59  Ht 5\' 8"  (1.727 m)  Wt 168 lb (76.204 kg)  BMI 25.55 kg/m2 , BMI Body mass index is 25.55 kg/(m^2). GEN: Well nourished, well developed, in no acute distress HEENT: normal Neck: no JVD, carotid bruits, or masses Cardiac: RRR; no murmurs, rubs, or gallops,no edema  Respiratory:  clear to auscultation bilaterally, normal work of breathing GI: soft, nontender, nondistended, + BS MS: no deformity or atrophy Skin: warm and dry, no rash Neuro:  Strength and sensation are intact Psych: euthymic mood, full affect   EKG:  EKG is not ordered today.    Recent Labs: 01/27/2014: TSH 0.62 04/16/2014: Pro B Natriuretic peptide (BNP) 152.4 10/14/2014: ALT 15; Hemoglobin 11.6*; Platelets 203 10/15/2014: B Natriuretic Peptide 70.0 10/27/2014: BUN 18; Creatinine 1.83*; Potassium 4.9; Sodium 139    Lipid Panel No results found for: CHOL, TRIG, HDL, CHOLHDL, VLDL, LDLCALC, LDLDIRECT    Wt Readings from Last 3 Encounters:  12/09/14 168 lb (76.204 kg)  11/26/14 168 lb 12.8 oz (76.567 kg)  10/28/14 165 lb (74.844 kg)    ASSESSMENT AND PLAN:  1. ASCAD s/p remote CABG with known atretic LIMA to LAD and  40% LM. Continue ASA.Lexiscan myoview showed no ischemia.  His CP is very atypical and is sharp and only lasts a second.  He is being treated for GERD. 2. HTN borderline controlled today  - At home it runs [id number] systolic.  Continue BB 3. Dyslipidemia - he stopped statin. Will check FLP and ALT 4. Carotid artery stenosis s/p left CEA with right carotid bruit - he has not followed up with vascular surgery recently. I will set him up to see Dr. Kellie Simmering back. Repeat carotid dopplers in 4 months 5. Renal artery stenosis s/p left renal artery stent 6. GERD 7. COPD 8. CKD stage 3    Current medicines are reviewed at length with the patient today.  The patient does not have concerns regarding medicines.  The following changes have been made:  no change  Labs/ tests ordered today include: none  No orders of the defined types were placed in this encounter.     Disposition:   FU with me in 6 months  Signed, Sueanne Margarita, MD  12/09/2014 9:15 AM    [facility] [place], [place], [place]  [address] Phone: [phone]; Fax: [phone]

## 2014-12-09 NOTE — Telephone Encounter (Signed)
Informed patient of results and verbal understanding expressed.   Lipid Clinic referral placed for scheduling. Patient agrees with treatment plan.

## 2014-12-09 NOTE — Addendum Note (Signed)
Addended by: Eulis Foster on: 12/09/2014 10:19 AM   Modules accepted: Orders

## 2014-12-09 NOTE — Addendum Note (Signed)
Addended by: Harland German A on: 12/09/2014 10:31 AM   Modules accepted: Orders

## 2014-12-14 DIAGNOSIS — J189 Pneumonia, unspecified organism: Secondary | ICD-10-CM | POA: Diagnosis not present

## 2014-12-14 DIAGNOSIS — J441 Chronic obstructive pulmonary disease with (acute) exacerbation: Secondary | ICD-10-CM | POA: Diagnosis not present

## 2014-12-21 ENCOUNTER — Ambulatory Visit (INDEPENDENT_AMBULATORY_CARE_PROVIDER_SITE_OTHER): Payer: Commercial Managed Care - HMO | Admitting: Pharmacist Clinician (PhC)/ Clinical Pharmacy Specialist

## 2014-12-21 ENCOUNTER — Encounter: Payer: Self-pay | Admitting: Pharmacist Clinician (PhC)/ Clinical Pharmacy Specialist

## 2014-12-21 VITALS — Ht 68.0 in | Wt 169.0 lb

## 2014-12-21 DIAGNOSIS — E785 Hyperlipidemia, unspecified: Secondary | ICD-10-CM

## 2014-12-21 MED ORDER — ATORVASTATIN CALCIUM 10 MG PO TABS: 10.0000 mg | ORAL_TABLET | Freq: Every day | ORAL | Status: DC

## 2014-12-21 NOTE — Patient Instructions (Signed)
Start atorvastatin 10 mg once daily, preferably in the evenings.  Eat healthy snack choices - try almonds or walnuts, in addition to apples, raisins.  Repeat cholesterol labs in 3 months

## 2014-12-21 NOTE — Assessment & Plan Note (Addendum)
Patient with history of ASCVD, post CABG, currently with LDL of 110 on no cholesterol lowering medications.  He has an extensive medical history and medication list.  Wife takes care of all of his medications due to onset of dementia.  His chart shows he previously took Crestor, but wife cannot recall when or why this was discontinued.  To help with medication costs, we will re-start him on atorvastatin 10 mg daily.   I have given them a lab order to repeat lipids and hepatic function in 3 months.  They live in [place] and will have the labs drawn in that area.

## 2014-12-21 NOTE — Progress Notes (Signed)
12/21/2014 [name] [name] 1930-11-19 [id number]   HPI:  [name] [name] is a 81 y.o. male patient of Dr Radford Pax,  who presents today for a lipid clinic evaluation.  RF:  CABG 1997, L iliac stent, RAS with L stent  Meds: took Crestor at one time in the past, wife not sure why they stopped; nothing currently  Diet: per wife patient has problems with acid reflux and therefore is very particular about what he'll eat; does like beans, nuts.    Exercise:  None  Labs:  Results for [name] [name] (MRN [id number]) as of 12/21/2014 15:42  Ref. Range 12/09/2014 10:18  Cholesterol Latest Ref Range: 0-200 mg/dL 213 (H)  Triglycerides Latest Ref Range: 0.0-149.0 mg/dL 238.0 (H)  HDL Cholesterol Latest Ref Range: >39.00 mg/dL 34.50 (L)  Direct LDL Latest Units: mg/dL 110.0  VLDL Latest Ref Range: 0.0-40.0 mg/dL 47.6 (H)     Current Outpatient Prescriptions  Medication Sig Dispense Refill  . acetaminophen (TYLENOL) 500 MG tablet Take 1,000 mg by mouth every 6 (six) hours as needed for headache.    . albuterol (PROVENTIL HFA;VENTOLIN HFA) 108 (90 BASE) MCG/ACT inhaler Inhale 2 puffs into the lungs every 4 (four) hours as needed for wheezing or shortness of breath. 1 Inhaler 0  . albuterol (PROVENTIL) (2.5 MG/3ML) 0.083% nebulizer solution Take 1 vial (26mL) 3-4 times per day. AS NEEDED 1080 mL 1  . ALPRAZolam (XANAX) 0.5 MG tablet Take 1 tablet (0.5 mg total) by mouth daily. If needed (Patient taking differently: Take 0.5 mg by mouth daily as needed. ) 90 tablet 1  . arformoterol (BROVANA) 15 MCG/2ML NEBU Take 2 mLs (15 mcg total) by nebulization 2 (two) times daily. 120 mL 6  . aspirin 325 MG tablet Take 325 mg by mouth daily.      Marland Kitchen azelastine (ASTELIN) 0.1 % nasal spray Place 1-2 sprays into both nostrils 2 (two) times daily. Use in each nostril as directed    . bisacodyl (DULCOLAX) 5 MG EC tablet Take 5 mg by mouth daily as needed for moderate constipation.    . calcitRIOL (ROCALTROL) 0.5 MCG capsule  Take 0.5 mcg by mouth daily.    . Cholecalciferol (VITAMIN D PO) Take 1 tablet by mouth daily. 2000 units    . donepezil (ARICEPT) 10 MG tablet TAKE 1 TABLET AT BEDTIME 90 tablet 1  . feeding supplement, ENSURE ENLIVE, (ENSURE ENLIVE) LIQD Take 237 mLs by mouth 2 (two) times daily between meals. 30 Bottle 6  . finasteride (PROSCAR) 5 MG tablet Take 5 mg by mouth daily.     . fluticasone (FLONASE) 50 MCG/ACT nasal spray Place 2 sprays into both nostrils daily.    Marland Kitchen gabapentin (NEURONTIN) 300 MG capsule Take 1 capsule (300 mg total) by mouth 3 (three) times daily. (Patient taking differently: Take 300 mg by mouth 2 (two) times daily. ) 270 capsule 3  . guaiFENesin-dextromethorphan (ROBITUSSIN DM) 100-10 MG/5ML syrup Take 5 mLs by mouth every 4 (four) hours as needed for cough. 118 mL 0  . lansoprazole (PREVACID) 30 MG capsule Take 1 capsule by mouth daily.    Marland Kitchen latanoprost (XALATAN) 0.005 % ophthalmic solution Place 1 drop into both eyes at bedtime.     . Memantine HCl ER (NAMENDA XR) 28 MG CP24 TAKE ONE CAPSULE BY MOUTH ONCE DAILY. (Patient taking differently: Take 1 capsule by mouth daily. ) 90 capsule 1  . metoCLOPramide (REGLAN) 10 MG tablet Take 10 mg by mouth 2 (two) times  daily.    . metoprolol succinate (TOPROL XL) 25 MG 24 hr tablet Take 1 tablet (25 mg total) by mouth daily. 30 tablet 6  . mirtazapine (REMERON) 15 MG tablet Take 15 mg by mouth at bedtime.     . mometasone (NASONEX) 50 MCG/ACT nasal spray Place 2 sprays into the nose daily.    . Multiple Vitamin (MULTIVITAMIN WITH MINERALS) TABS tablet Take 1 tablet by mouth daily.    . Naphazoline-Glycerin (CLEAR EYES REDNESS RELIEF OP) Apply 1 drop to eye daily.    . nitroGLYCERIN (NITROSTAT) 0.4 MG SL tablet Place 1 tablet (0.4 mg total) under the tongue every 5 (five) minutes as needed for chest pain. 90 tablet 3  . nortriptyline (PAMELOR) 10 MG capsule Take 10 mg by mouth 2 (two) times daily. Two capsules per day    . polyethylene  glycol (MIRALAX / GLYCOLAX) packet Take 17 g by mouth daily.    . promethazine (PHENERGAN) 25 MG tablet Take 25 mg by mouth every 6 (six) hours as needed for nausea or vomiting.    . Tamsulosin HCl (FLOMAX) 0.4 MG CAPS Take 0.4 mg by mouth daily.      No current facility-administered medications for this visit.    No Known Allergies  Past Medical History  Diagnosis Date  . PVD (peripheral vascular disease)     s/p left renal artery stent and left iliac stent  . Hypertension   . Hyperlipemia   . GERD (gastroesophageal reflux disease)   . COPD (chronic obstructive pulmonary disease)   . Monoclonal gammopathy   . BPH (benign prostatic hyperplasia)   . DEMENTIA   . Alzheimer disease   . Memory loss   . CAD (coronary artery disease)     s/p CABG in 1997 with known atretic LIMA to LAD and 40% LM.    Marland Kitchen Chronic kidney disease (CKD), stage III (moderate)   . Carotid artery stenosis     s/p left CEA.  Carotid dopplers 09/2013 60-79% right ICA stenosis and 1-39% left ICA stenosis of left CEA  . Chronic chest wall pain   . Peptic ulcer disease   . Depression with anxiety   . Retinal artery occlusion     Height 5\' 8"  (1.727 m), weight 169 lb (76.658 kg).   ASSESSMENT AND PLAN:  Tommy Medal PharmD CPP [facility]

## 2014-12-23 DIAGNOSIS — B351 Tinea unguium: Secondary | ICD-10-CM | POA: Diagnosis not present

## 2014-12-23 DIAGNOSIS — I77811 Abdominal aortic ectasia: Secondary | ICD-10-CM | POA: Diagnosis not present

## 2014-12-23 DIAGNOSIS — K219 Gastro-esophageal reflux disease without esophagitis: Secondary | ICD-10-CM | POA: Diagnosis not present

## 2014-12-23 DIAGNOSIS — J309 Allergic rhinitis, unspecified: Secondary | ICD-10-CM | POA: Diagnosis not present

## 2014-12-23 DIAGNOSIS — K5900 Constipation, unspecified: Secondary | ICD-10-CM | POA: Diagnosis not present

## 2014-12-23 DIAGNOSIS — J449 Chronic obstructive pulmonary disease, unspecified: Secondary | ICD-10-CM | POA: Diagnosis not present

## 2014-12-23 DIAGNOSIS — I10 Essential (primary) hypertension: Secondary | ICD-10-CM | POA: Diagnosis not present

## 2014-12-23 DIAGNOSIS — N183 Chronic kidney disease, stage 3 (moderate): Secondary | ICD-10-CM | POA: Diagnosis not present

## 2014-12-27 ENCOUNTER — Encounter: Payer: Self-pay | Admitting: Vascular Surgery

## 2014-12-28 ENCOUNTER — Encounter: Payer: Commercial Managed Care - HMO | Admitting: Vascular Surgery

## 2015-01-10 DIAGNOSIS — I10 Essential (primary) hypertension: Secondary | ICD-10-CM | POA: Diagnosis not present

## 2015-01-10 DIAGNOSIS — J309 Allergic rhinitis, unspecified: Secondary | ICD-10-CM | POA: Diagnosis not present

## 2015-01-10 DIAGNOSIS — R399 Unspecified symptoms and signs involving the genitourinary system: Secondary | ICD-10-CM | POA: Diagnosis not present

## 2015-01-10 DIAGNOSIS — N400 Enlarged prostate without lower urinary tract symptoms: Secondary | ICD-10-CM | POA: Diagnosis not present

## 2015-01-11 ENCOUNTER — Encounter: Payer: Self-pay | Admitting: Podiatry

## 2015-01-11 ENCOUNTER — Ambulatory Visit (INDEPENDENT_AMBULATORY_CARE_PROVIDER_SITE_OTHER): Payer: Commercial Managed Care - HMO | Admitting: Podiatry

## 2015-01-11 DIAGNOSIS — B351 Tinea unguium: Secondary | ICD-10-CM

## 2015-01-11 DIAGNOSIS — M79673 Pain in unspecified foot: Secondary | ICD-10-CM | POA: Diagnosis not present

## 2015-01-11 NOTE — Progress Notes (Signed)
He presents today with a chief complaint of painful toenails one through 5 bilateral.  Objective: Vital signs are stable he is alert and oriented x3. Pulses are palpable bilateral. Nails are thick yellow dystrophic with mycotic and painful palpation..  Assessment: Pain in limb secondary to onychomycosis 1 through 5 bilateral.  Plan: Debridement of nails 1 through 5 bilateral covered service secondary to pain.

## 2015-01-19 ENCOUNTER — Encounter: Payer: Self-pay | Admitting: Vascular Surgery

## 2015-01-21 DIAGNOSIS — N138 Other obstructive and reflux uropathy: Secondary | ICD-10-CM | POA: Diagnosis not present

## 2015-01-21 DIAGNOSIS — K5900 Constipation, unspecified: Secondary | ICD-10-CM | POA: Diagnosis not present

## 2015-01-21 DIAGNOSIS — N401 Enlarged prostate with lower urinary tract symptoms: Secondary | ICD-10-CM | POA: Diagnosis not present

## 2015-01-25 ENCOUNTER — Encounter: Payer: Self-pay | Admitting: Vascular Surgery

## 2015-01-25 ENCOUNTER — Ambulatory Visit (INDEPENDENT_AMBULATORY_CARE_PROVIDER_SITE_OTHER): Payer: Commercial Managed Care - HMO | Admitting: Vascular Surgery

## 2015-01-25 VITALS — BP 142/68 | HR 63 | Ht 68.0 in | Wt 168.6 lb

## 2015-01-25 DIAGNOSIS — I6521 Occlusion and stenosis of right carotid artery: Secondary | ICD-10-CM

## 2015-01-25 DIAGNOSIS — I6529 Occlusion and stenosis of unspecified carotid artery: Secondary | ICD-10-CM | POA: Insufficient documentation

## 2015-01-25 NOTE — Progress Notes (Signed)
Subjective:     Patient ID: [name] [name], male   DOB: [birth year], 81 y.o.   MRN: [id number]  HPI this 81 year old male returns for continued follow-up regarding his carotid occlusive disease he was referred by Dr. Golden Hurter. Patient had left carotid endarterectomy by me in December 2003 for severe asymptomatic stenosis. He's done well from that standpoint. We have followed a moderate right ICA stenosis in the 60-70% range for many years but have not seen him for the past several years. He does have dementia. He is accompanied by his wife. She denies any new symptoms of lateralizing weakness, aphasia, amaurosis fugax, diplopia, blurred vision, or syncope. He has poor vision in the right eye for many years. He does have some abnormal and unsteady gait has progressed.  Past Medical History  Diagnosis Date  . PVD (peripheral vascular disease)     s/p left renal artery stent and left iliac stent  . Hypertension   . Hyperlipemia   . GERD (gastroesophageal reflux disease)   . COPD (chronic obstructive pulmonary disease)   . Monoclonal gammopathy   . BPH (benign prostatic hyperplasia)   . DEMENTIA   . Alzheimer disease   . Memory loss   . CAD (coronary artery disease)     s/p CABG in 1997 with known atretic LIMA to LAD and 40% LM.    Marland Kitchen Chronic kidney disease (CKD), stage III (moderate)   . Carotid artery stenosis     s/p left CEA.  Carotid dopplers 09/2013 60-79% right ICA stenosis and 1-39% left ICA stenosis of left CEA  . Chronic chest wall pain   . Peptic ulcer disease   . Depression with anxiety   . Retinal artery occlusion     History  Substance Use Topics  . Smoking status: Former Smoker -- 0.50 packs/day for 40 years    Types: Cigarettes    Quit date: 07/23/1978  . Smokeless tobacco: Never Used  . Alcohol Use: No    Family History  Problem Relation Age of Onset  . Anesthesia problems Neg Hx   . Hypotension Neg Hx   . Malignant hyperthermia Neg Hx   . Pseudochol deficiency  Neg Hx   . High blood pressure    . High Cholesterol    . Hypertension Father   . Hypertension Mother     No Known Allergies   Current outpatient prescriptions:  .  acetaminophen (TYLENOL) 500 MG tablet, Take 1,000 mg by mouth every 6 (six) hours as needed for headache., Disp: , Rfl:  .  albuterol (PROVENTIL HFA;VENTOLIN HFA) 108 (90 BASE) MCG/ACT inhaler, Inhale 2 puffs into the lungs every 4 (four) hours as needed for wheezing or shortness of breath., Disp: 1 Inhaler, Rfl: 0 .  albuterol (PROVENTIL) (2.5 MG/3ML) 0.083% nebulizer solution, Take 1 vial (69mL) 3-4 times per day. AS NEEDED, Disp: 1080 mL, Rfl: 1 .  ALPRAZolam (XANAX) 0.5 MG tablet, Take 1 tablet (0.5 mg total) by mouth daily. If needed (Patient taking differently: Take 0.5 mg by mouth daily as needed. ), Disp: 90 tablet, Rfl: 1 .  arformoterol (BROVANA) 15 MCG/2ML NEBU, Take 2 mLs (15 mcg total) by nebulization 2 (two) times daily., Disp: 120 mL, Rfl: 6 .  aspirin 325 MG tablet, Take 325 mg by mouth daily.  , Disp: , Rfl:  .  atorvastatin (LIPITOR) 10 MG tablet, Take 1 tablet (10 mg total) by mouth daily., Disp: 90 tablet, Rfl: 1 .  azelastine (ASTELIN) 0.1 % nasal  spray, Place 1-2 sprays into both nostrils 2 (two) times daily. Use in each nostril as directed, Disp: , Rfl:  .  bisacodyl (DULCOLAX) 5 MG EC tablet, Take 5 mg by mouth daily as needed for moderate constipation., Disp: , Rfl:  .  calcitRIOL (ROCALTROL) 0.5 MCG capsule, Take 0.5 mcg by mouth daily., Disp: , Rfl:  .  Cholecalciferol (VITAMIN D PO), Take 1 tablet by mouth daily. 2000 units, Disp: , Rfl:  .  donepezil (ARICEPT) 10 MG tablet, TAKE 1 TABLET AT BEDTIME, Disp: 90 tablet, Rfl: 1 .  feeding supplement, ENSURE ENLIVE, (ENSURE ENLIVE) LIQD, Take 237 mLs by mouth 2 (two) times daily between meals., Disp: 30 Bottle, Rfl: 6 .  finasteride (PROSCAR) 5 MG tablet, Take 5 mg by mouth daily. , Disp: , Rfl:  .  fluticasone (FLONASE) 50 MCG/ACT nasal spray, Place 2  sprays into both nostrils daily., Disp: , Rfl:  .  gabapentin (NEURONTIN) 300 MG capsule, Take 1 capsule (300 mg total) by mouth 3 (three) times daily. (Patient taking differently: Take 300 mg by mouth 2 (two) times daily. ), Disp: 270 capsule, Rfl: 3 .  guaiFENesin-dextromethorphan (ROBITUSSIN DM) 100-10 MG/5ML syrup, Take 5 mLs by mouth every 4 (four) hours as needed for cough., Disp: 118 mL, Rfl: 0 .  lansoprazole (PREVACID) 30 MG capsule, Take 1 capsule by mouth daily., Disp: , Rfl:  .  latanoprost (XALATAN) 0.005 % ophthalmic solution, Place 1 drop into both eyes at bedtime. , Disp: , Rfl:  .  Memantine HCl ER (NAMENDA XR) 28 MG CP24, TAKE ONE CAPSULE BY MOUTH ONCE DAILY. (Patient taking differently: Take 1 capsule by mouth daily. ), Disp: 90 capsule, Rfl: 1 .  metoCLOPramide (REGLAN) 10 MG tablet, Take 10 mg by mouth 2 (two) times daily., Disp: , Rfl:  .  metoprolol succinate (TOPROL XL) 25 MG 24 hr tablet, Take 1 tablet (25 mg total) by mouth daily., Disp: 30 tablet, Rfl: 6 .  mirtazapine (REMERON) 15 MG tablet, Take 15 mg by mouth at bedtime. , Disp: , Rfl:  .  mometasone (NASONEX) 50 MCG/ACT nasal spray, Place 2 sprays into the nose daily., Disp: , Rfl:  .  Multiple Vitamin (MULTIVITAMIN WITH MINERALS) TABS tablet, Take 1 tablet by mouth daily., Disp: , Rfl:  .  Naphazoline-Glycerin (CLEAR EYES REDNESS RELIEF OP), Apply 1 drop to eye daily., Disp: , Rfl:  .  nitroGLYCERIN (NITROSTAT) 0.4 MG SL tablet, Place 1 tablet (0.4 mg total) under the tongue every 5 (five) minutes as needed for chest pain., Disp: 90 tablet, Rfl: 3 .  nortriptyline (PAMELOR) 10 MG capsule, Take 10 mg by mouth 2 (two) times daily. Two capsules per day, Disp: , Rfl:  .  polyethylene glycol (MIRALAX / GLYCOLAX) packet, Take 17 g by mouth daily., Disp: , Rfl:  .  promethazine (PHENERGAN) 25 MG tablet, Take 25 mg by mouth every 6 (six) hours as needed for nausea or vomiting., Disp: , Rfl:  .  Tamsulosin HCl (FLOMAX) 0.4 MG  CAPS, Take 0.4 mg by mouth daily. , Disp: , Rfl:   Filed Vitals:   01/25/15 1409 01/25/15 1411  BP: 153/71 142/68  Pulse: 65 63  Height: 5\' 8"  (1.727 m)   Weight: 168 lb 9.6 oz (76.476 kg)   SpO2: 96%     Body mass index is 25.64 kg/(m^2).           Review of Systems positive for BPH, Alzheimer's dementia, dyspnea on exertion, lower extremity  edema, productive cough, wheezing, occasional chest pressure. At his post coronary artery bypass grafting in 1997     Objective:   Physical Exam BP 142/68 mmHg  Pulse 63  Ht 5\' 8"  (1.727 m)  Wt 168 lb 9.6 oz (76.476 kg)  BMI 25.64 kg/m2  SpO2 96%  Gen.-alert and oriented x3 in no apparent distress HEENT normal for age Lungs no rhonchi or wheezing Cardiovascular regular rhythm no murmurs carotid pulses 3+ palpable no bruits audible Abdomen soft nontender no palpable masses Musculoskeletal free of  major deformities Skin clear -no rashes Neurologic normal Lower extremities 3+ femoral and dorsalis pedis pulses palpable bilaterally with no edema  Today I reviewed the carotid duplex exam report which was performed in March 2016. This reveals a left carotid endarterectomy site to be widely patent. The right ICA has a 60-70% proximal stenosis which has not progressed over the past few years.      Assessment:     #1 stable right carotid occlusive disease at 60-70% in severity with no specific symptoms Status post left carotid endarterectomy 2003-widely patent #3 Alzheimer's dementia #4 status post coronary artery bypass grafting 1997-stable presently    Plan:     Return in one year with follow-up carotid duplex exam and see nurse practitioner to follow moderate right ICA stenosis.

## 2015-02-01 DIAGNOSIS — N2581 Secondary hyperparathyroidism of renal origin: Secondary | ICD-10-CM | POA: Diagnosis not present

## 2015-02-01 DIAGNOSIS — D649 Anemia, unspecified: Secondary | ICD-10-CM | POA: Diagnosis not present

## 2015-02-01 DIAGNOSIS — N183 Chronic kidney disease, stage 3 (moderate): Secondary | ICD-10-CM | POA: Diagnosis not present

## 2015-02-02 ENCOUNTER — Ambulatory Visit (INDEPENDENT_AMBULATORY_CARE_PROVIDER_SITE_OTHER): Payer: Commercial Managed Care - HMO | Admitting: Critical Care Medicine

## 2015-02-02 ENCOUNTER — Encounter: Payer: Self-pay | Admitting: Critical Care Medicine

## 2015-02-02 ENCOUNTER — Ambulatory Visit (INDEPENDENT_AMBULATORY_CARE_PROVIDER_SITE_OTHER)
Admission: RE | Admit: 2015-02-02 | Discharge: 2015-02-02 | Disposition: A | Discharge status: Home / Self Care | Payer: Commercial Managed Care - HMO | Source: Ambulatory Visit | Attending: Critical Care Medicine | Admitting: Critical Care Medicine

## 2015-02-02 VITALS — BP 168/80 | HR 68 | Ht 68.0 in | Wt 170.4 lb

## 2015-02-02 DIAGNOSIS — J441 Chronic obstructive pulmonary disease with (acute) exacerbation: Secondary | ICD-10-CM | POA: Diagnosis not present

## 2015-02-02 DIAGNOSIS — R0600 Dyspnea, unspecified: Secondary | ICD-10-CM | POA: Diagnosis not present

## 2015-02-02 DIAGNOSIS — R05 Cough: Secondary | ICD-10-CM | POA: Diagnosis not present

## 2015-02-02 DIAGNOSIS — I739 Peripheral vascular disease, unspecified: Secondary | ICD-10-CM | POA: Diagnosis not present

## 2015-02-02 DIAGNOSIS — I10 Essential (primary) hypertension: Secondary | ICD-10-CM | POA: Diagnosis not present

## 2015-02-02 MED ORDER — AZITHROMYCIN 250 MG PO TABS: ORAL_TABLET | ORAL | Status: DC

## 2015-02-02 MED ORDER — PREDNISONE 10 MG PO TABS: ORAL_TABLET | ORAL | Status: DC

## 2015-02-02 NOTE — Progress Notes (Signed)
Quick Note:  Attempted to call pt but went to busy tone. ______

## 2015-02-02 NOTE — Patient Instructions (Addendum)
Take azithromycin 250mg  Take two once then one daily until gone Take prednisone 10mg  Take 4 for two days three for two days two for two days one for two days No change in nebulizer medications A chest xray will be obtained Oxygen 2Liters continous will be obtained  Speak to your primary care MD about remeron and gabapentin causing drowsiness, depressed affect Return tammy parrett 2 weeks recheck

## 2015-02-02 NOTE — Progress Notes (Signed)
Subjective:    Patient ID: [name] [name], male    DOB: 05-Jun-1931, 81 y.o.   MRN: [id number]  HPI 02/02/2015 Chief Complaint  Patient presents with  . Follow-up    Sx's have not improved since last visit still coughing up thick yellow mucus. Occ SOB and wheezing. Itchy eyes, runny nose and mild facial swellling first thing in the mornings.    Since last visit still heavy mucus and hard to raise.  Notes some chest pain.  Not swallow issue.  Memory is not as good.  Nose runs all the time.  Sees Dr Krista Blue for Neurology. Notes more facial swelling.   Pt denies any significant sore throat, nasal congestion or excess secretions, fever, chills, sweats, unintended weight loss, pleurtic or exertional chest pain, orthopnea PND, or leg swelling Pt denies any increase in rescue therapy over baseline, denies waking up needing it or having any early am or nocturnal exacerbations of coughing/wheezing/or dyspnea. Pt also denies any obvious fluctuation in symptoms with  weather or environmental change or other alleviating or aggravating factors   Current Medications, Allergies, Complete Past Medical History, Past Surgical History, Family History, and Social History were reviewed in [facility] record per todays encounter:  02/02/2015    Review of Systems  Constitutional: Positive for activity change and fatigue. Negative for fever.  HENT: Positive for drooling. Negative for ear pain, postnasal drip, rhinorrhea, sinus pressure, sore throat, trouble swallowing and voice change.   Eyes: Negative.   Respiratory: Positive for cough, choking, chest tightness, shortness of breath and wheezing. Negative for apnea and stridor.   Cardiovascular: Negative.  Negative for chest pain, palpitations and leg swelling.  Gastrointestinal: Negative.  Negative for nausea, vomiting, abdominal pain and abdominal distention.  Genitourinary: Negative.   Musculoskeletal: Negative.  Negative for myalgias and  arthralgias.  Skin: Negative.  Negative for rash.  Allergic/Immunologic: Negative.  Negative for environmental allergies and food allergies.  Neurological: Positive for weakness and light-headedness. Negative for dizziness, syncope and headaches.  Hematological: Negative.  Negative for adenopathy. Does not bruise/bleed easily.  Psychiatric/Behavioral: Positive for confusion and decreased concentration. Negative for sleep disturbance and agitation. The patient is not nervous/anxious.        Objective:   Physical Exam Filed Vitals:   02/02/15 1036  BP: 168/80  Pulse: 68  Height: 5\' 8"  (1.727 m)  Weight: 170 lb 6.4 oz (77.293 kg)  SpO2: 99%    Gen: Pleasant, well-nourished, in no distress,  normal affect  ENT: No lesions,  mouth clear,  oropharynx clear, no postnasal drip  Neck: No JVD, no TMG, no carotid bruits  Lungs: No use of accessory muscles, no dullness to percussion, exp wheezes  Cardiovascular: RRR, heart sounds normal, no murmur or gallops, no peripheral edema  Abdomen: soft and NT, no HSM,  BS normal  Musculoskeletal: No deformities, no cyanosis or clubbing  Neuro: dull affect, non focal  Skin: Warm, no lesions or rashes cxr reviewed: Dg Chest 2 View  02/02/2015   CLINICAL DATA:  Cough and congestion 1 month.  EXAM: CHEST  2 VIEW  COMPARISON:  10/27/2014 and 08/19/2014  FINDINGS: Sternotomy wires unchanged. Lungs are adequately inflated with mild stable coarse increased interstitial markings bilaterally right worse than left. No focal consolidation or effusion. Cardiomediastinal silhouette and remainder of the exam is unchanged.  IMPRESSION: No acute cardiopulmonary disease.  Stable coarse interstitial markings right greater than left likely emphysematous/fibrotic change.   Electronically Signed   By: Quillian Quince  Derrel Nip M.D.   On: 02/02/2015 12:58          Assessment & Plan:  I personally reviewed all images and lab data in the [facility] system as well as any outside  material available during this office visit and agree with the  radiology impressions.   Obstructive chronic bronchitis with exacerbation, Gold C Copd Copd Gold C. Progressive neurologic decline ? Element aspiration desats into 80s with ambulation on RA cxr neg for pna this visit  plan Take azithromycin 250mg  Take two once then one daily until gone Take prednisone 10mg  Take 4 for two days three for two days two for two days one for two days No change in nebulizer medications Oxygen 2Liters continous will be obtained  Speak to your primary care MD about remeron and gabapentin causing drowsiness, depressed affect Return tammy parrett 2 weeks recheck     Lakeem was seen today for follow-up.  Diagnoses and all orders for this visit:  COPD with exacerbation Orders: -     Discontinue: predniSONE (DELTASONE) 10 MG tablet; Take 4 for two days three for two days two for two days one for two days -     Discontinue: azithromycin (ZITHROMAX) 250 MG tablet; Take two once then one daily until gone -     DG Chest 2 View; Future -     AMB REFERRAL FOR DME  Obstructive chronic bronchitis with exacerbation, Gold C Copd    I had an extended discussion with the patient and or family lasting 10 minutes of a 25 minute visit including:  Dx, need for abx/steroid/ need to consider reduction of neuro meds

## 2015-02-03 ENCOUNTER — Telehealth: Payer: Self-pay | Admitting: Critical Care Medicine

## 2015-02-03 DIAGNOSIS — J441 Chronic obstructive pulmonary disease with (acute) exacerbation: Secondary | ICD-10-CM

## 2015-02-03 MED ORDER — AZITHROMYCIN 250 MG PO TABS: ORAL_TABLET | ORAL | Status: DC

## 2015-02-03 MED ORDER — PREDNISONE 10 MG PO TABS: ORAL_TABLET | ORAL | Status: DC

## 2015-02-03 NOTE — Telephone Encounter (Signed)
Result Notes     Notes Recorded by Glean Hess, CMA on 02/03/2015 at 9:27 AM lmtcb. ------  Notes Recorded by Osa Craver, CMA on 02/02/2015 at 2:44 PM Attempted to call pt but went to busy tone. ------  Notes Recorded by Elsie Stain, MD on 02/02/2015 at 2:06 PM No pneumonia No other changes   Spoke with pt's wife, Peter Congo. She is aware of pt's Xray results. Nothing further was needed.

## 2015-02-03 NOTE — Telephone Encounter (Signed)
Wife states Azithromycin and Prednisone were sent to wrong pharmacy. States they were sent to [facility] and she told [employer] to cancel. Meds resent to correct pharmacy. Nothing furhter needed.

## 2015-02-03 NOTE — Telephone Encounter (Signed)
Spoke with wife and she was confused on when pt should wear O2. Informed her per Pt instructions O2 @ 2L cont. Nothing further needed.

## 2015-02-04 NOTE — Assessment & Plan Note (Signed)
Copd Gold C. Progressive neurologic decline ? Element aspiration desats into 80s with ambulation on RA cxr neg for pna this visit  plan Take azithromycin 250mg  Take two once then one daily until gone Take prednisone 10mg  Take 4 for two days three for two days two for two days one for two days No change in nebulizer medications Oxygen 2Liters continous will be obtained  Speak to your primary care MD about remeron and gabapentin causing drowsiness, depressed affect Return tammy parrett 2 weeks recheck

## 2015-02-08 ENCOUNTER — Other Ambulatory Visit: Payer: Self-pay

## 2015-02-08 MED ORDER — METOPROLOL SUCCINATE ER 25 MG PO TB24: 25.0000 mg | ORAL_TABLET | Freq: Every day | ORAL | Status: DC

## 2015-02-17 ENCOUNTER — Telehealth: Payer: Self-pay | Admitting: Critical Care Medicine

## 2015-02-17 DIAGNOSIS — J441 Chronic obstructive pulmonary disease with (acute) exacerbation: Secondary | ICD-10-CM

## 2015-02-17 MED ORDER — AMOXICILLIN 500 MG PO CAPS: 500.0000 mg | ORAL_CAPSULE | Freq: Three times a day (TID) | ORAL | Status: DC

## 2015-02-17 NOTE — Telephone Encounter (Signed)
Coughing up thick yellow mucus, runny nose, swelling in legs and ankle since being on oxygen.  Patient is on 2L continuous oxygen.  Patient has finished antibiotics and prednisone, but the mucus is thicker and more of it.   Pharmacy: [facility]   No Known Allergies

## 2015-02-17 NOTE — Telephone Encounter (Signed)
Order- [facility] please add humidifier to his O2 concentrator  Offer patient- Rx amoxacillin 500 mg, # 21,     1 three times daily

## 2015-02-17 NOTE — Telephone Encounter (Signed)
I agree

## 2015-02-17 NOTE — Telephone Encounter (Signed)
Rx sent for humidifier and amoxicillin Patient's caregiver notified. Nothing further needed.

## 2015-02-25 ENCOUNTER — Ambulatory Visit (INDEPENDENT_AMBULATORY_CARE_PROVIDER_SITE_OTHER): Payer: Commercial Managed Care - HMO | Admitting: Adult Health

## 2015-02-25 ENCOUNTER — Encounter: Payer: Self-pay | Admitting: Adult Health

## 2015-02-25 VITALS — BP 124/66 | HR 60 | Temp 97.5°F | Ht 67.0 in | Wt 175.0 lb

## 2015-02-25 DIAGNOSIS — J9611 Chronic respiratory failure with hypoxia: Secondary | ICD-10-CM | POA: Insufficient documentation

## 2015-02-25 DIAGNOSIS — J441 Chronic obstructive pulmonary disease with (acute) exacerbation: Secondary | ICD-10-CM

## 2015-02-25 NOTE — Addendum Note (Signed)
Addended by: Osa Craver on: 02/25/2015 04:04 PM   Modules accepted: Orders

## 2015-02-25 NOTE — Patient Instructions (Signed)
Finish Antibiotics as directed.  Mucinex DM Twice daily  As needed  Cough/congestion  Wear Oxygen 2l/m  Follow up Dr. Joya Gaskins in 2 months and As needed   Order for portable oxygen concentrator .

## 2015-02-25 NOTE — Assessment & Plan Note (Signed)
Compensated on oxygen 

## 2015-02-25 NOTE — Progress Notes (Signed)
   Subjective:    Patient ID: [name] [name], male    DOB: [birth year], 81 y.o.   MRN: [id number]  HPI  81 year old male former smoker with COPD Gold C COPD GOLD CARD HOLDER.   02/25/2015 Follow up : COPD  Pt returns for two-week  follow up  Patient was recently seen for a COPD flare. He was treated with a Z-Pak and prednisone. He did improve, however , symptoms continued to linger and was called in amoxicillin 1 week ago. He has a few days left of this. He is starting to feel better with decreased cough, congestion. He is now on oxygen 2 L. Wife requests a Marine scientist.   Had dementia on namenda and aricept.   Review of Systems  Constitutional:   No  weight loss, night sweats,  Fevers, chills, fatigue, or  lassitude.  HEENT:   No headaches,  Difficulty swallowing,  Tooth/dental problems, or  Sore throat,                No sneezing, itching, ear ache,  +nasal congestion, post nasal drip,   CV:  No chest pain,  Orthopnea, PND, swelling in lower extremities, anasarca, dizziness, palpitations, syncope.   GI  No heartburn, indigestion, abdominal pain, nausea, vomiting, diarrhea, change in bowel habits, loss of appetite, bloody stools.   Resp.  No chest wall deformity  Skin: no rash or lesions.  GU: no dysuria, change in color of urine, no urgency or frequency.  No flank pain, no hematuria   MS:  No joint pain or swelling.  No decreased range of motion.  No back pain.  Psych:  No change in mood or affect. No depression or anxiety.  + memory loss.    ]    Objective:   Physical Exam  GEN: A/Ox3; pleasant , NAD, elderly   HEENT:  [place]/AT,  EACs-clear, TMs-wnl, NOSE-clear, THROAT-clear, no lesions, no postnasal drip or exudate noted.   NECK:  Supple w/ fair ROM; no JVD; normal carotid impulses w/o bruits; no thyromegaly or nodules palpated; no lymphadenopathy.  RESP  Decreased BS in bases  .no accessory muscle use, no dullness to percussion  CARD:  RRR, no m/r/g   , no peripheral edema, pulses intact, no cyanosis or clubbing.  GI:   Soft & nt; nml bowel sounds; no organomegaly or masses detected.  Musco: Warm bil, no deformities or joint swelling noted.   Neuro: alert, no focal deficits noted.    Skin: Warm, no lesions or rashes         Assessment & Plan:

## 2015-02-25 NOTE — Assessment & Plan Note (Signed)
Recent exacerbation, now resolving..  Plan Finish Antibiotics as directed.  Mucinex DM Twice daily  As needed  Cough/congestion  Wear Oxygen 2l/m  Follow up Dr. Joya Gaskins in 2 months and As needed   Order for portable oxygen concentrator .

## 2015-03-05 DIAGNOSIS — I739 Peripheral vascular disease, unspecified: Secondary | ICD-10-CM | POA: Diagnosis not present

## 2015-03-05 DIAGNOSIS — R0600 Dyspnea, unspecified: Secondary | ICD-10-CM | POA: Diagnosis not present

## 2015-03-05 DIAGNOSIS — J441 Chronic obstructive pulmonary disease with (acute) exacerbation: Secondary | ICD-10-CM | POA: Diagnosis not present

## 2015-03-05 DIAGNOSIS — I10 Essential (primary) hypertension: Secondary | ICD-10-CM | POA: Diagnosis not present

## 2015-03-07 DIAGNOSIS — R350 Frequency of micturition: Secondary | ICD-10-CM | POA: Diagnosis not present

## 2015-03-08 DIAGNOSIS — H4011X1 Primary open-angle glaucoma, mild stage: Secondary | ICD-10-CM | POA: Diagnosis not present

## 2015-03-16 DIAGNOSIS — J441 Chronic obstructive pulmonary disease with (acute) exacerbation: Secondary | ICD-10-CM | POA: Diagnosis not present

## 2015-03-16 DIAGNOSIS — J449 Chronic obstructive pulmonary disease, unspecified: Secondary | ICD-10-CM | POA: Diagnosis not present

## 2015-03-16 DIAGNOSIS — J9611 Chronic respiratory failure with hypoxia: Secondary | ICD-10-CM | POA: Diagnosis not present

## 2015-03-21 DIAGNOSIS — N183 Chronic kidney disease, stage 3 (moderate): Secondary | ICD-10-CM | POA: Diagnosis not present

## 2015-03-21 DIAGNOSIS — N2581 Secondary hyperparathyroidism of renal origin: Secondary | ICD-10-CM | POA: Diagnosis not present

## 2015-03-30 ENCOUNTER — Ambulatory Visit
Admission: RE | Admit: 2015-03-30 | Discharge: 2015-03-30 | Disposition: A | Discharge status: Home / Self Care | Payer: Medicare HMO | Source: Ambulatory Visit | Attending: Family Medicine | Admitting: Family Medicine

## 2015-03-30 ENCOUNTER — Other Ambulatory Visit: Payer: Self-pay | Admitting: Family Medicine

## 2015-03-30 DIAGNOSIS — M545 Low back pain: Secondary | ICD-10-CM | POA: Diagnosis not present

## 2015-03-30 DIAGNOSIS — M47817 Spondylosis without myelopathy or radiculopathy, lumbosacral region: Secondary | ICD-10-CM | POA: Diagnosis not present

## 2015-03-30 DIAGNOSIS — D649 Anemia, unspecified: Secondary | ICD-10-CM | POA: Diagnosis not present

## 2015-03-30 DIAGNOSIS — R400 Somnolence: Secondary | ICD-10-CM | POA: Diagnosis not present

## 2015-03-30 DIAGNOSIS — R609 Edema, unspecified: Secondary | ICD-10-CM | POA: Diagnosis not present

## 2015-03-30 DIAGNOSIS — B353 Tinea pedis: Secondary | ICD-10-CM | POA: Diagnosis not present

## 2015-03-31 DIAGNOSIS — N183 Chronic kidney disease, stage 3 (moderate): Secondary | ICD-10-CM | POA: Diagnosis not present

## 2015-03-31 DIAGNOSIS — I129 Hypertensive chronic kidney disease with stage 1 through stage 4 chronic kidney disease, or unspecified chronic kidney disease: Secondary | ICD-10-CM | POA: Diagnosis not present

## 2015-03-31 DIAGNOSIS — N2581 Secondary hyperparathyroidism of renal origin: Secondary | ICD-10-CM | POA: Diagnosis not present

## 2015-04-05 DIAGNOSIS — R0600 Dyspnea, unspecified: Secondary | ICD-10-CM | POA: Diagnosis not present

## 2015-04-05 DIAGNOSIS — I10 Essential (primary) hypertension: Secondary | ICD-10-CM | POA: Diagnosis not present

## 2015-04-05 DIAGNOSIS — J441 Chronic obstructive pulmonary disease with (acute) exacerbation: Secondary | ICD-10-CM | POA: Diagnosis not present

## 2015-04-05 DIAGNOSIS — I739 Peripheral vascular disease, unspecified: Secondary | ICD-10-CM | POA: Diagnosis not present

## 2015-04-07 ENCOUNTER — Other Ambulatory Visit: Payer: Self-pay | Admitting: Cardiology

## 2015-04-11 ENCOUNTER — Encounter (HOSPITAL_COMMUNITY): Payer: Commercial Managed Care - HMO

## 2015-04-11 ENCOUNTER — Inpatient Hospital Stay (HOSPITAL_COMMUNITY): Admission: RE | Admit: 2015-04-11 | Payer: Commercial Managed Care - HMO | Source: Ambulatory Visit

## 2015-04-14 DIAGNOSIS — N183 Chronic kidney disease, stage 3 (moderate): Secondary | ICD-10-CM | POA: Diagnosis not present

## 2015-04-14 DIAGNOSIS — J309 Allergic rhinitis, unspecified: Secondary | ICD-10-CM | POA: Diagnosis not present

## 2015-04-14 DIAGNOSIS — B351 Tinea unguium: Secondary | ICD-10-CM | POA: Diagnosis not present

## 2015-04-14 DIAGNOSIS — J449 Chronic obstructive pulmonary disease, unspecified: Secondary | ICD-10-CM | POA: Diagnosis not present

## 2015-04-14 DIAGNOSIS — D649 Anemia, unspecified: Secondary | ICD-10-CM | POA: Diagnosis not present

## 2015-04-14 DIAGNOSIS — M25521 Pain in right elbow: Secondary | ICD-10-CM | POA: Diagnosis not present

## 2015-04-14 DIAGNOSIS — G629 Polyneuropathy, unspecified: Secondary | ICD-10-CM | POA: Diagnosis not present

## 2015-04-14 DIAGNOSIS — I10 Essential (primary) hypertension: Secondary | ICD-10-CM | POA: Diagnosis not present

## 2015-04-16 DIAGNOSIS — J9611 Chronic respiratory failure with hypoxia: Secondary | ICD-10-CM | POA: Diagnosis not present

## 2015-04-16 DIAGNOSIS — J441 Chronic obstructive pulmonary disease with (acute) exacerbation: Secondary | ICD-10-CM | POA: Diagnosis not present

## 2015-04-16 DIAGNOSIS — J449 Chronic obstructive pulmonary disease, unspecified: Secondary | ICD-10-CM | POA: Diagnosis not present

## 2015-04-19 DIAGNOSIS — R195 Other fecal abnormalities: Secondary | ICD-10-CM | POA: Diagnosis not present

## 2015-04-19 DIAGNOSIS — D649 Anemia, unspecified: Secondary | ICD-10-CM | POA: Diagnosis not present

## 2015-04-19 DIAGNOSIS — K3184 Gastroparesis: Secondary | ICD-10-CM | POA: Diagnosis not present

## 2015-04-19 DIAGNOSIS — K219 Gastro-esophageal reflux disease without esophagitis: Secondary | ICD-10-CM | POA: Diagnosis not present

## 2015-04-19 DIAGNOSIS — R197 Diarrhea, unspecified: Secondary | ICD-10-CM | POA: Diagnosis not present

## 2015-04-21 ENCOUNTER — Ambulatory Visit: Payer: Commercial Managed Care - HMO | Admitting: Podiatry

## 2015-04-26 ENCOUNTER — Ambulatory Visit (INDEPENDENT_AMBULATORY_CARE_PROVIDER_SITE_OTHER): Payer: Commercial Managed Care - HMO | Admitting: Internal Medicine

## 2015-04-26 ENCOUNTER — Encounter: Payer: Self-pay | Admitting: Internal Medicine

## 2015-04-26 ENCOUNTER — Telehealth: Payer: Self-pay | Admitting: Internal Medicine

## 2015-04-26 VITALS — BP 130/80 | HR 60 | Ht 68.0 in | Wt 174.0 lb

## 2015-04-26 DIAGNOSIS — J9611 Chronic respiratory failure with hypoxia: Secondary | ICD-10-CM

## 2015-04-26 DIAGNOSIS — J449 Chronic obstructive pulmonary disease, unspecified: Secondary | ICD-10-CM | POA: Insufficient documentation

## 2015-04-26 MED ORDER — PREDNISONE 10 MG PO TABS: ORAL_TABLET | ORAL | Status: DC

## 2015-04-26 MED ORDER — BUDESONIDE 0.25 MG/2ML IN SUSP: RESPIRATORY_TRACT | Status: DC

## 2015-04-26 NOTE — Telephone Encounter (Signed)
Spoke with pt's wife. They need rx's for prednisone and budesonide sent to their local pharmacy. These have been sent in. Nothing further was needed.

## 2015-04-26 NOTE — Progress Notes (Signed)
Subjective:    Patient ID: [name] [name], male    DOB: 1931-07-10  MRN: [id number]    Brief patient profile:  81 year old male quit smoking in 1980s  Followed previously by Dr Joya Gaskins for copd with gold III criteria April 2016    History of Present Illness  02/25/2015 Follow up : COPD  NP ov    Patient was recently seen for a COPD flare. He was treated with a Z-Pak and prednisone. He did improve, however , symptoms continued to linger and was called in amoxicillin 1 week prior to OV   He has a few days left of this. He is starting to feel better with decreased cough, congestion. He is now on oxygen 2 L. Wife requests a Marine scientist. rec Finish Antibiotics as directed.  Mucinex DM Twice daily  As needed  Cough/congestion  Wear Oxygen 2l/m   Order for portable oxygen concentrator .     04/26/2015  Ext ov/Wert establish care re: GOLD III copd/ noct 02 dep / maint on brovana but no bud Chief Complaint  Patient presents with  . Follow-up    Pt states his breathing is overall doing well. He still c/o chest congestion, but not coughing much.   using 02 prn   A few hours a day not with exertion, not necessarily at hs  Worse swelling, has kidney doctor and heart doctor but unsure who to call re swelling   No obvious day to day or daytime variability or assoc excess or purulent mucus  or cp or chest tightness, subjective wheeze or overt sinus or hb symptoms. No unusual exp hx or h/o childhood pna/ asthma or knowledge of premature birth.  Sleeping ok without nocturnal  or early am exacerbation  of respiratory  c/o's or need for noct saba. Also denies any obvious fluctuation of symptoms with weather or environmental changes or other aggravating or alleviating factors except as outlined above   Current Medications, Allergies, Complete Past Medical History, Past Surgical History, Family History, and Social History were reviewed in [employer] record.  ROS  The  following are not active complaints unless bolded sore throat, dysphagia, dental problems, itching, sneezing,  nasal congestion or excess/ purulent secretions, ear ache,   fever, chills, sweats, unintended wt loss, classically pleuritic or exertional cp, hemoptysis,  orthopnea pnd or leg swelling, presyncope, palpitations, abdominal pain, anorexia, nausea, vomiting, diarrhea  or change in bowel or bladder habits, change in stools or urine, dysuria,hematuria,  rash, arthralgias, visual complaints, headache, numbness, weakness or ataxia or problems with walking or coordination,  change in mood/affect or memory.        Objective:   Physical Exam  GEN: A/Ox3; pleasant , NAD, elderly    Wt Readings from Last 3 Encounters:  04/26/15 174 lb (78.926 kg)  02/25/15 175 lb (79.379 kg)  02/02/15 170 lb 6.4 oz (77.293 kg)    Vital signs reviewed     HEENT:  [place]/AT,  EACs-clear, TMs-wnl, NOSE-clear, THROAT-clear, no lesions, no postnasal drip or exudate noted.   NECK:  Supple w/ fair ROM; no JVD; normal carotid impulses w/o bruits; no thyromegaly or nodules palpated; no lymphadenopathy.  RESP   Min  insp / exp rhonchi bilaterally   CARD:  RRR, no m/r/g  , no peripheral edema, pulses intact, no cyanosis or clubbing - 1+ pitting bilateral lower ext edema   GI:   Soft & nt; nml bowel sounds; no organomegaly or masses detected.  Musco:  Warm bil, no deformities or joint swelling noted.   Neuro: alert, no focal deficits noted.    Skin: Warm, no lesions or rashes    I personally reviewed images and agree with radiology impression as follows:  CXR:   02/02/15 No acute cardiopulmonary disease. Stable coarse interstitial markings right greater than left likely emphysematous/fibrotic change.       Assessment & Plan:

## 2015-04-26 NOTE — Patient Instructions (Addendum)
Continue 02 2lpm at bedtime for now  - ok to leave off during the day and just use with heavy exertion   Prednisone 10 mg take  4 each am x 2 days,   2 each am x 2 days,  1 each am x 2 days and stop   Add budesonide 0.25 mg twice daily with nebulizer treatments   Let your kidney doctor know about the swelling   See [name] NP in 4  weeks with all your medications, even over the counter meds, separated in two separate bags, the ones you take no matter what vs the ones you stop once you feel better and take only as needed when you feel you need them.   [name]  will generate for you a new user friendly medication calendar that will put [place] all on the same page re: your medication use.     Without this process, it simply isn't possible to assure that we are providing  your outpatient care  with  the attention to detail we feel you deserve.   If we cannot assure that you're getting that kind of care,  then we cannot manage your problem effectively from this clinic.  Once you have seen [name] and we are sure that we're all on the same page with your medication use she will arrange follow up with me.

## 2015-04-27 ENCOUNTER — Encounter: Payer: Self-pay | Admitting: Internal Medicine

## 2015-04-27 NOTE — Assessment & Plan Note (Addendum)
Enrolled in Gold COPD program 10/27/2014 Spirometry  10/27/14  FEV1  1.86 (73%) ratio 66  - Added bud to brovana 04/26/2015   He has persistent rhonchi on exam with a sense of chest congestion and so probably has enough airway inflammation to justify an inhaled steroid. Recommended a short course of prednisone and then start budesonide 0.25 mg twice daily along with continued Brovana.  I had an extended discussion with the patient and wife reviewing all relevant studies completed to date and  lasting 25 minutes of a 40 minute visit    Each maintenance medication was reviewed in detail including most importantly the difference between maintenance and prns and under what circumstances the prns are to be triggered using an action plan format that is not reflected in the computer generated alphabetically organized AVS  He really needs med reconciliation at this point and wife apparently not sure sure who to call for what problem, another issue we'll need to help with   To keep things simple, I have asked the patient to first separate medicines that are perceived as maintenance, that is to be taken daily "no matter what", from those medicines that are taken on only on an as-needed basis and I have given the patient examples of both, and then return to see our NP to generate a  detailed  medication calendar which should be followed until the next physician sees the patient and updates it.  .    Please see instructions for details which were reviewed in writing and the patient given a copy highlighting the part that I personally wrote and discussed at today's ov.

## 2015-04-27 NOTE — Assessment & Plan Note (Signed)
04/26/2015   Walked RA  2 laps @ 185 ft each stopped due to  Leg pain, slow pace, sats at very end down to 85%   He only desaturates when we pushed him further than he normally walks. Therefore I don't think that ambulatory oxygen is really going to help mobilize him but with exertion he should be using 2 L and always at bedtime, reviewed/ see avs

## 2015-04-29 DIAGNOSIS — D649 Anemia, unspecified: Secondary | ICD-10-CM | POA: Diagnosis not present

## 2015-05-02 ENCOUNTER — Encounter: Payer: Self-pay | Admitting: Nurse Practitioner

## 2015-05-02 ENCOUNTER — Ambulatory Visit (INDEPENDENT_AMBULATORY_CARE_PROVIDER_SITE_OTHER): Payer: Commercial Managed Care - HMO | Admitting: Nurse Practitioner

## 2015-05-02 ENCOUNTER — Telehealth: Payer: Self-pay

## 2015-05-02 VITALS — BP 137/68 | HR 70 | Ht 67.0 in | Wt 177.8 lb

## 2015-05-02 DIAGNOSIS — F0280 Dementia in other diseases classified elsewhere without behavioral disturbance: Secondary | ICD-10-CM

## 2015-05-02 DIAGNOSIS — R413 Other amnesia: Secondary | ICD-10-CM | POA: Diagnosis not present

## 2015-05-02 DIAGNOSIS — G309 Alzheimer's disease, unspecified: Secondary | ICD-10-CM | POA: Diagnosis not present

## 2015-05-02 MED ORDER — GABAPENTIN 300 MG PO CAPS: 300.0000 mg | ORAL_CAPSULE | Freq: Three times a day (TID) | ORAL | Status: DC

## 2015-05-02 MED ORDER — DONEPEZIL HCL 10 MG PO TABS: 10.0000 mg | ORAL_TABLET | Freq: Every day | ORAL | Status: DC

## 2015-05-02 NOTE — Patient Instructions (Addendum)
Continue Aricept at current dose Continue Namenda at current dose.  Given a copy of PAP form to fill out and return. F/U in 6 months

## 2015-05-02 NOTE — Progress Notes (Signed)
[facility]  PATIENT: [name] [name] DOB: [birth year]   REASON FOR VISIT: follow-up for Alzheimer's disease, memory loss, paresthesias of the feet, excessive drowsiness HISTORY FROM:patient and wife    HISTORY OF PRESENT ILLNESS:[name] [name] is a 81 years old right-handed African American male, accompanied by his wife at today's clinical visit, he was a patient of Dr. Jules Husbands since February 2009, for evaluation of memory trouble, his primary care physician is Dr. Ivery Quale.  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 Some anxiety, improved on Remeron and prn Xanax.  He is occasionally irritable and easily provoked to anger. Wife says that he has decreased appetite recently. UPDATE June 2nd 2015: He now came in complaining of bilateral feet and hands paresthesia, which is new since Feb 2015, his symptoms are getting worse, he could hardly picking up things, he also complains of mild gait difficulty, low back pain, no bowel and bladder incontinence,  Recent laboratory evaluation in May 21st 2013, showed mild anemia, hemoglobin 11.8, glucose 96, B12 1004, normal TSH, protein electrophoresis showed elevated M spike a 1.3, mild increased of creatinine 1.57, with GFR of 33, normal liver function tests previously.  He is going to be evaluated by hematologist Dr. Judeen Hammans soon. He also complains of low back pain, no significant neck pain,  UPDATE July 6th 2015: He was evaluated by Dr. Benay Spice for chronic monoclonal IgG lambda protein. There is no clinical evidence of progression to multiple myeloma. The M spike has not changed significantly over many years.  He still has numbness tinglings in his fingers, even to his arms and chest, frightening to him. We have reviewed MRI lumbar together, L5-S1: pseudo-disc bulging and facet hypertrophy with mild-moderate right and moderate left foraminal stenosis. L3-4: disc bulging and facet hypertrophy with mild biforaminal stenosis. L4-5: pseudo-disc bulging and facet hypertrophy with mild biforaminal stenosis. notable for left renal cyst (5.7cm).  MRI cervical spine (without) demonstrating: C5-6: disc bulging and uncovertebral joint hypertrophy with severe right and moderate left foraminal stenosis. Disc bulging and spondylosis from C4-5 to T2-3. He has history of chronic renal disease, is going to be see by his nephrologist Dr. Posey Pronto in August 2015, I have suggested continued followup with his incidental finding of a left renal cyst with his nephrologist Dr. Posey Pronto  UPDATE April 7th 2016: he continue has slow worsening memory trouble, taking Namenda xr 28 mg, Aricept 10 mg daily, COPD, recovering from pneumonia, Complains of excessive sleepiness,  He is not eating well, he has bad taste in his mouth, Ambulate without difficulty, he has stopped nortriptyline, taking gabapentin for lower extremity pain,  UPDATE 05/02/15 [name] [name], 81 year old male returns for follow-up. He has a history of Alzheimer's dementia and paresthesias of the feet.is currently on Aricept 10 mg daily and Namenda 28 mg extended release through patient assistance. He  has not reapplied for his assistance and took his last pill today according to the wife. He is also taking gabapentin 300 mg 3 times  daily for his paresthesias however he is drowsy from the medication and was encouraged to decrease to 2 times a day. No recent falls and he does not use an assistive device. No longer drives.  REVIEW OF SYSTEMS: Full 14 system review of systems performed and notable only for those listed, all others are neg:  Constitutional: neg  Cardiovascular: neg Ear/Nose/Throat: neg  Skin: neg Eyes: neg Respiratory: neg Gastroitestinal: neg  Hematology/Lymphatic: neg  Endocrine: neg Musculoskeletal:neg Allergy/Immunology: neg Neurological: memory loss paresthesias of the feet Psychiatric: neg Sleep : neg   ALLERGIES: No Known Allergies  HOME MEDICATIONS: Outpatient Prescriptions Prior to Visit  Medication Sig Dispense Refill  . acetaminophen (TYLENOL) 500 MG tablet Take 1,000 mg by mouth every 6 (six) hours as needed for headache.    . albuterol (PROVENTIL HFA;VENTOLIN HFA) 108 (90 BASE) MCG/ACT inhaler Inhale 2 puffs into the lungs every 4 (four) hours as needed for wheezing or shortness of breath. 1 Inhaler 0  . arformoterol (BROVANA) 15 MCG/2ML NEBU Take 2 mLs (15 mcg total) by nebulization 2 (two) times daily. 120 mL 6  . aspirin 325 MG tablet Take 325 mg by mouth daily.      Marland Kitchen atorvastatin (LIPITOR) 10 MG tablet Take 1 tablet (10 mg total) by mouth daily. 90 tablet 1  . azelastine (ASTELIN) 0.1 % nasal spray Place 1-2 sprays into both nostrils 2 (two) times daily. Use in each nostril as directed    . bisacodyl (DULCOLAX) 5 MG EC tablet Take 5 mg by mouth daily as needed for moderate constipation.    . budesonide (PULMICORT) 0.25 MG/2ML nebulizer solution Use one vial twice daily with brovana 120 mL 11  . calcitRIOL (ROCALTROL) 0.5 MCG capsule Take 0.5 mcg by mouth daily.    Marland Kitchen donepezil (ARICEPT) 10 MG tablet TAKE 1 TABLET AT BEDTIME 90 tablet 1  . feeding supplement, ENSURE ENLIVE, (ENSURE ENLIVE) LIQD Take 237 mLs by mouth 2 (two) times daily between meals. 30 Bottle 6  . finasteride  (PROSCAR) 5 MG tablet Take 5 mg by mouth daily.     . fluticasone (FLONASE) 50 MCG/ACT nasal spray Place 2 sprays into both nostrils daily.    Marland Kitchen gabapentin (NEURONTIN) 300 MG capsule Take 1 capsule (300 mg total) by mouth 3 (three) times daily. (Patient taking differently: Take 300 mg by mouth 2 (two) times daily. ) 270 capsule 3  . guaiFENesin-dextromethorphan (ROBITUSSIN DM) 100-10 MG/5ML syrup Take 5 mLs by mouth every 4 (four) hours as needed for cough. 118 mL 0  . lansoprazole (PREVACID) 30 MG capsule Take 1 capsule by mouth daily.    Marland Kitchen latanoprost (XALATAN) 0.005 % ophthalmic solution Place 1 drop into both eyes at bedtime.     . Memantine HCl ER (NAMENDA XR) 28 MG CP24 TAKE ONE CAPSULE BY MOUTH ONCE DAILY. (Patient taking differently: Take 1 capsule by mouth daily. ) 90 capsule 1  . metoCLOPramide (REGLAN) 10 MG tablet Take 10 mg by mouth 2 (two) times daily.    . metoprolol succinate (TOPROL-XL) 25 MG 24 hr tablet TAKE 1 TABLET EVERY DAY 90 tablet 0  . mirtazapine (REMERON) 15 MG tablet Take 15 mg by mouth at bedtime.     . Multiple Vitamin (MULTIVITAMIN WITH MINERALS) TABS tablet Take 1 tablet by mouth daily.    . Naphazoline-Glycerin (CLEAR EYES  REDNESS RELIEF OP) Apply 1 drop to eye daily.    . nitroGLYCERIN (NITROSTAT) 0.4 MG SL tablet Place 1 tablet (0.4 mg total) under the tongue every 5 (five) minutes as needed for chest pain. 90 tablet 3  . polyethylene glycol (MIRALAX / GLYCOLAX) packet Take 17 g by mouth daily.    . predniSONE (DELTASONE) 10 MG tablet Take  4 each am x 2 days,   2 each am x 2 days,  1 each am x 2 days and stop 14 tablet 0  . Tamsulosin HCl (FLOMAX) 0.4 MG CAPS Take 0.4 mg by mouth daily.      No facility-administered medications prior to visit.    PAST MEDICAL HISTORY: Past Medical History  Diagnosis Date  . PVD (peripheral vascular disease) ([place])     s/p left renal artery stent and left iliac stent  . Hypertension   . Hyperlipemia   . GERD  (gastroesophageal reflux disease)   . COPD (chronic obstructive pulmonary disease) ([place])   . Monoclonal gammopathy   . BPH (benign prostatic hyperplasia)   . DEMENTIA   . Alzheimer disease   . Memory loss   . CAD (coronary artery disease)     s/p CABG in 1997 with known atretic LIMA to LAD and 40% LM.    Marland Kitchen Chronic kidney disease (CKD), stage III (moderate)   . Carotid artery stenosis     s/p left CEA.  Carotid dopplers 09/2013 60-79% right ICA stenosis and 1-39% left ICA stenosis of left CEA  . Chronic chest wall pain   . Peptic ulcer disease   . Depression with anxiety   . Retinal artery occlusion   . On home oxygen therapy     at bedtime.      PAST SURGICAL HISTORY: Past Surgical History  Procedure Laterality Date  . Coronary artery bypass graft  1997  . Left cea  2002  . Back surgery  2011  . Cataract extraction w/phaco  09/18/2011    Procedure: CATARACT EXTRACTION PHACO AND INTRAOCULAR LENS PLACEMENT (IOC);  Surgeon: Elta Guadeloupe T. Gershon Crane, MD;  Location: [facility];  Service: Ophthalmology;  Laterality: Right;  CDE: 6.19  . Cardiac surgery    . Renal artery stent      FAMILY HISTORY: Family History  Problem Relation Age of Onset  . Anesthesia problems Neg Hx   . Hypotension Neg Hx   . Malignant hyperthermia Neg Hx   . Pseudochol deficiency Neg Hx   . High blood pressure    . High Cholesterol    . Hypertension Father   . Hypertension Mother     SOCIAL HISTORY: Social History   Social History  . Marital Status: Married    Spouse Name: Peter Congo  . Number of Children: 8  . Years of Education: 13   Occupational History  .      Retired   Social History Main Topics  . Smoking status: Former Smoker -- 0.50 packs/day for 40 years    Types: Cigarettes    Quit date: 07/23/1978  . Smokeless tobacco: Never Used  . Alcohol Use: No  . Drug Use: No  . Sexual Activity: Not on file   Other Topics Concern  . Not on file   Social History Narrative   Patient lives at home with  his wife. Peter Congo). Patient is retired. Patient has one Year of college.   Right handed.   Caffeine- sometimes     PHYSICAL EXAM  Filed Vitals:  05/02/15 1500  BP: 137/68  Pulse: 70  Height: _0  (1.702 m)  Weight: 177 lb 12.8 oz (80.65 kg)   Body mass index is 27.84 kg/(m^2).  Generalized: Well developed, in no acute distress, well-groomed  Head: normocephalic and atraumatic,. Oropharynx benign  Neck: Supple, no carotid bruits  Cardiac: Regular rate rhythm, no murmur  Musculoskeletal: No deformity   Neurological examination   Mentation: Alert  MMSE 14/30 same as last visit, missing items in orientation,  calculation and 3 of 3 recall.AFT 8. Clock drawing 2/4.   Follows all commands speech and language fluent.   Cranial nerve II-XII: Pupils were equal round reactive to light extraocular movements were full, visual field were full on confrontational test. Facial sensation and strength were normal. hearing was intact to finger rubbing bilaterally. Uvula tongue midline. head turning and shoulder shrug were normal and symmetric.Tongue protrusion into cheek strength was normal. Motor: normal bulk and tone, full strength in the BUE, BLE, fine finger movements normal, no pronator drift. No focal weakness Sensory: intact to light touch  Coordination: finger-nose-finger, heel-to-shin bilaterally, no dysmetria Reflexes: Brachioradialis 2/2, biceps 2/2, triceps 2/2, patellar 2/2, Achilles 2/2, plantar responses were flexor bilaterally. Gait and Station: Rising up from seated position without assistance, stooped stance,  moderate stride, good arm swing, smooth turning, cautious gait, no assistive device DIAGNOSTIC DATA (LABS, IMAGING, TESTING) - I reviewed patient records, labs, notes, testing and imaging myself where available.  Lab Results  Component Value Date   WBC 7.2 10/14/2014   HGB 11.6* 10/14/2014   HCT 34.3* 10/14/2014   MCV 100.6* 10/14/2014   PLT 203 10/14/2014        Component Value Date/Time   NA 137 12/09/2014 1018   NA 142 12/30/2013 0958   K 5.0 12/09/2014 1018   K 5.2* 12/30/2013 0958   CL 104 12/09/2014 1018   CL 111* 04/14/2012 1049   CO2 29 12/09/2014 1018   CO2 26 12/30/2013 0958   GLUCOSE 85 12/09/2014 1018   GLUCOSE 97 12/30/2013 0958   GLUCOSE 99 04/14/2012 1049   BUN 19 12/09/2014 1018   BUN 19.8 12/30/2013 0958   CREATININE 1.92* 12/09/2014 1018   CREATININE 2.1* 12/30/2013 0958   CALCIUM 9.6 12/09/2014 1018   CALCIUM 9.5 12/30/2013 0958   PROT 8.1 12/09/2014 1018   PROT 8.1 12/30/2013 0958   PROT 7.3 12/22/2013 1305   ALBUMIN 4.0 12/09/2014 1018   ALBUMIN 3.8 12/30/2013 0958   AST 25 12/09/2014 1018   AST 28 12/30/2013 0958   ALT 14 12/09/2014 1018   ALT 19 12/30/2013 0958   ALKPHOS 60 12/09/2014 1018   ALKPHOS 58 12/30/2013 0958   BILITOT 0.4 12/09/2014 1018   BILITOT 0.27 12/30/2013 0958   GFRNONAA 30* 10/16/2014 0608   GFRAA 35* 10/16/2014 0608   Lab Results  Component Value Date   CHOL 213* 12/09/2014   HDL 34.50* 12/09/2014   LDLDIRECT 110.0 12/09/2014   TRIG 238.0* 12/09/2014   CHOLHDL 6 12/09/2014       ASSESSMENT AND PLAN  81 y.o. year old male  has a past medical history of; Hypertension; Hyperlipemia;  Monoclonal gammopathy;  DEMENTIA; Alzheimer disease; Memory loss; CAD (coronary artery disease); Chronic kidney disease (CKD), stage III (moderate); On home oxygen therapy. Here to follow up. He also has bilateral lower extremity paresthesia, electrodiagnostic study showed no evidence of large fiber peripheral neuropathy, lumbar and cervical degenerative disc disease, likely contribute to his complaints.He complains of excessive drowsiness  Continue Aricept at current dose Will refill Continue Namenda at current dose. Given samples of medication until his patient assistance can be processed probably take a couple of weeks  Given a copy of PA form to fill out and return so he gets his Namenda at no  cost.  Decrease gabapentin to twice daily to see if this helps drowsiness, at risk for falls be careful with ambulation F/U in 6 months Dennie Bible, [facility], [facility], [facility] [address] [place], [place] [address] [phone]

## 2015-05-02 NOTE — Telephone Encounter (Signed)
I called [facility]AP at [phone] Patient ID# [id number].  Refill ordered.  Meds will arrive in 7-10 business days.  Ref # [id number]

## 2015-05-04 ENCOUNTER — Telehealth: Payer: Self-pay | Admitting: Internal Medicine

## 2015-05-04 MED ORDER — ARFORMOTEROL TARTRATE 15 MCG/2ML IN NEBU: 15.0000 ug | INHALATION_SOLUTION | Freq: Two times a day (BID) | RESPIRATORY_TRACT | Status: DC

## 2015-05-04 NOTE — Telephone Encounter (Signed)
Called and spoke with pt's wife Wife stated that pt needed refill on [place] faxed to [facility] at [phone] Informed wife that refill would be sent today  Order printed and signed by MW and faxed to [phone]  Nothing further is needed

## 2015-05-05 ENCOUNTER — Encounter (HOSPITAL_COMMUNITY): Payer: Medicare HMO

## 2015-05-05 ENCOUNTER — Other Ambulatory Visit: Payer: Self-pay | Admitting: Cardiology

## 2015-05-05 ENCOUNTER — Telehealth: Payer: Self-pay

## 2015-05-05 DIAGNOSIS — R0600 Dyspnea, unspecified: Secondary | ICD-10-CM | POA: Diagnosis not present

## 2015-05-05 DIAGNOSIS — I10 Essential (primary) hypertension: Secondary | ICD-10-CM | POA: Diagnosis not present

## 2015-05-05 DIAGNOSIS — J441 Chronic obstructive pulmonary disease with (acute) exacerbation: Secondary | ICD-10-CM | POA: Diagnosis not present

## 2015-05-05 DIAGNOSIS — I739 Peripheral vascular disease, unspecified: Secondary | ICD-10-CM | POA: Diagnosis not present

## 2015-05-05 NOTE — Telephone Encounter (Signed)
Called and spoke to patient's wife relayed Patient asst. Medication ready for pick up. Relayed office hours.

## 2015-05-06 NOTE — Progress Notes (Signed)
I have reviewed and agreed above plan. 

## 2015-05-16 DIAGNOSIS — J9611 Chronic respiratory failure with hypoxia: Secondary | ICD-10-CM | POA: Diagnosis not present

## 2015-05-16 DIAGNOSIS — J449 Chronic obstructive pulmonary disease, unspecified: Secondary | ICD-10-CM | POA: Diagnosis not present

## 2015-05-16 DIAGNOSIS — J441 Chronic obstructive pulmonary disease with (acute) exacerbation: Secondary | ICD-10-CM | POA: Diagnosis not present

## 2015-05-24 ENCOUNTER — Ambulatory Visit (INDEPENDENT_AMBULATORY_CARE_PROVIDER_SITE_OTHER): Payer: Commercial Managed Care - HMO | Admitting: Adult Health

## 2015-05-24 ENCOUNTER — Encounter: Payer: Self-pay | Admitting: Adult Health

## 2015-05-24 VITALS — BP 128/80 | HR 61 | Temp 97.8°F | Ht 67.0 in | Wt 177.0 lb

## 2015-05-24 DIAGNOSIS — J449 Chronic obstructive pulmonary disease, unspecified: Secondary | ICD-10-CM

## 2015-05-24 DIAGNOSIS — Z23 Encounter for immunization: Secondary | ICD-10-CM

## 2015-05-24 DIAGNOSIS — J9611 Chronic respiratory failure with hypoxia: Secondary | ICD-10-CM

## 2015-05-24 NOTE — Assessment & Plan Note (Signed)
Recent flare , now improved with steroids  Cont on current regimen   Plan  Prevnar vaccine today .  Saline nasal rinses As needed   Mucinex DM Twice daily  As needed  Cough/congestion  Wear Oxygen 2l/m  Follow up Dr. Melvyn Novas 3 months and As needed

## 2015-05-24 NOTE — Progress Notes (Signed)
Subjective:    Patient ID: [name] [name], male    DOB: 1930-10-23  MRN: [id number]    Brief patient profile:  81 year old male quit smoking in 1980s  Followed previously by Dr Joya Gaskins for copd with gold III criteria April 2016    History of Present Illness  02/25/2015 Follow up : COPD  NP ov    Patient was recently seen for a COPD flare. He was treated with a Z-Pak and prednisone. He did improve, however , symptoms continued to linger and was called in amoxicillin 1 week prior to OV   He has a few days left of this. He is starting to feel better with decreased cough, congestion. He is now on oxygen 2 L. Wife requests a Marine scientist. rec Finish Antibiotics as directed.  Mucinex DM Twice daily  As needed  Cough/congestion  Wear Oxygen 2l/m   Order for portable oxygen concentrator .     04/26/2015  Ext ov/Wert establish care re: GOLD III copd/ noct 02 dep / maint on brovana but no bud Chief Complaint  Patient presents with  . Follow-up    Pt states his breathing is overall doing well. He still c/o chest congestion, but not coughing much.   using 02 prn   A few hours a day not with exertion, not necessarily at hs  Worse swelling, has kidney doctor and heart doctor but unsure who to call re swelling  >pred taper , add budesonide neb Twice daily    05/24/2015 Follow up : GOLD III COPD , noct O2  Pt returns with his wife for 1 month follow up  He has moderate dementia on aricept and namenda.  Last ov with COPD flare , tx w/ pred taper .  Budesonide Neb was added to his Brovana neb regimen  She says he is doing better w/ decreased cough and wheezing.  Does have drippy nose on/off.  PVX and Flu shot are utd Discussed prevnar vaccine for today .  Denies chest pain, orthopnea, hemoptysis , or fever.   Current Medications, Allergies, Complete Past Medical History, Past Surgical History, Family History, and Social History were reviewed in [employer]  record.  ROS  The following are not active complaints unless bolded sore throat, dysphagia, dental problems, itching, sneezing,  nasal congestion or excess/ purulent secretions, ear ache,   fever, chills, sweats, unintended wt loss, classically pleuritic or exertional cp, hemoptysis,  orthopnea pnd or leg swelling, presyncope, palpitations, abdominal pain, anorexia, nausea, vomiting, diarrhea  or change in bowel or bladder habits, change in stools or urine, dysuria,hematuria,  rash, arthralgias, visual complaints, headache, numbness, weakness or ataxia or problems with walking or coordination,  change in mood/affect or memory.        Objective:   Physical Exam  GEN: A/Ox3; pleasant , NAD, elderly     Vital signs reviewed     HEENT:  [place]/AT,  EACs-clear, TMs-wnl, NOSE-clear, THROAT-clear, no lesions, no postnasal drip or exudate noted.   NECK:  Supple w/ fair ROM; no JVD; normal carotid impulses w/o bruits; no thyromegaly or nodules palpated; no lymphadenopathy.  RESP   Faint rhonchi   CARD:  RRR, no m/r/g  , no peripheral edema, pulses intact, no cyanosis or clubbing - tr 1+ edema   GI:   Soft & nt; nml bowel sounds; no organomegaly or masses detected.  Musco: Warm bil, no deformities or joint swelling noted.   Neuro: alert, no focal deficits noted.  Skin: Warm, no lesions or rashes      CXR:   02/02/15 No acute cardiopulmonary disease. Stable coarse interstitial markings right greater than left likely emphysematous/fibrotic change.       Assessment & Plan:

## 2015-05-24 NOTE — Assessment & Plan Note (Signed)
Cont on O2 At bedtime  And with activity

## 2015-05-24 NOTE — Patient Instructions (Signed)
Prevnar vaccine today .  Saline nasal rinses As needed   Mucinex DM Twice daily  As needed  Cough/congestion  Wear Oxygen 2l/m  Follow up Dr. Melvyn Novas 3 months and As needed

## 2015-05-25 NOTE — Progress Notes (Signed)
Chart and office note reviewed in detail  > agree with a/p as outlined    

## 2015-05-30 DIAGNOSIS — K219 Gastro-esophageal reflux disease without esophagitis: Secondary | ICD-10-CM | POA: Diagnosis not present

## 2015-05-30 DIAGNOSIS — I2510 Atherosclerotic heart disease of native coronary artery without angina pectoris: Secondary | ICD-10-CM | POA: Diagnosis not present

## 2015-05-30 DIAGNOSIS — N183 Chronic kidney disease, stage 3 (moderate): Secondary | ICD-10-CM | POA: Diagnosis not present

## 2015-05-30 DIAGNOSIS — K5900 Constipation, unspecified: Secondary | ICD-10-CM | POA: Diagnosis not present

## 2015-05-30 DIAGNOSIS — J449 Chronic obstructive pulmonary disease, unspecified: Secondary | ICD-10-CM | POA: Diagnosis not present

## 2015-05-30 DIAGNOSIS — F0390 Unspecified dementia without behavioral disturbance: Secondary | ICD-10-CM | POA: Diagnosis not present

## 2015-05-30 DIAGNOSIS — I739 Peripheral vascular disease, unspecified: Secondary | ICD-10-CM | POA: Diagnosis not present

## 2015-05-30 DIAGNOSIS — D509 Iron deficiency anemia, unspecified: Secondary | ICD-10-CM | POA: Diagnosis not present

## 2015-06-01 DIAGNOSIS — J449 Chronic obstructive pulmonary disease, unspecified: Secondary | ICD-10-CM | POA: Diagnosis not present

## 2015-06-05 DIAGNOSIS — I739 Peripheral vascular disease, unspecified: Secondary | ICD-10-CM | POA: Diagnosis not present

## 2015-06-05 DIAGNOSIS — J441 Chronic obstructive pulmonary disease with (acute) exacerbation: Secondary | ICD-10-CM | POA: Diagnosis not present

## 2015-06-05 DIAGNOSIS — R0600 Dyspnea, unspecified: Secondary | ICD-10-CM | POA: Diagnosis not present

## 2015-06-05 DIAGNOSIS — I10 Essential (primary) hypertension: Secondary | ICD-10-CM | POA: Diagnosis not present

## 2015-06-09 ENCOUNTER — Ambulatory Visit (INDEPENDENT_AMBULATORY_CARE_PROVIDER_SITE_OTHER): Payer: Commercial Managed Care - HMO | Admitting: Cardiology

## 2015-06-09 ENCOUNTER — Encounter: Payer: Self-pay | Admitting: Cardiology

## 2015-06-09 VITALS — BP 138/72 | HR 61 | Ht 67.0 in | Wt 177.0 lb

## 2015-06-09 DIAGNOSIS — I2510 Atherosclerotic heart disease of native coronary artery without angina pectoris: Secondary | ICD-10-CM

## 2015-06-09 DIAGNOSIS — I10 Essential (primary) hypertension: Secondary | ICD-10-CM

## 2015-06-09 DIAGNOSIS — E785 Hyperlipidemia, unspecified: Secondary | ICD-10-CM

## 2015-06-09 DIAGNOSIS — I2583 Coronary atherosclerosis due to lipid rich plaque: Principal | ICD-10-CM

## 2015-06-09 LAB — HEPATIC FUNCTION PANEL
ALBUMIN: 3.7 g/dL (ref 3.6–5.1)
ALK PHOS: 55 U/L (ref 40–115)
ALT: 19 U/L (ref 9–46)
AST: 23 U/L (ref 10–35)
Bilirubin, Direct: <0.1 mg/dL (ref <=0.2)
TOTAL PROTEIN: 7.4 g/dL (ref 6.1–8.1)
Total Bilirubin: 0.3 mg/dL (ref 0.2–1.2)

## 2015-06-09 LAB — LIPID PANEL
Cholesterol: 158 mg/dL (ref 125–200)
HDL: 28 mg/dL — ABNORMAL LOW (ref >=40)
LDL Cholesterol: 89 mg/dL (ref <130)
Total CHOL/HDL Ratio: 5.6 Ratio — ABNORMAL HIGH (ref <=5.0)
Triglycerides: 207 mg/dL — ABNORMAL HIGH (ref <150)
VLDL: 41 mg/dL — ABNORMAL HIGH (ref <30)

## 2015-06-09 NOTE — Progress Notes (Signed)
Cardiology Office Note   Date:  06/09/2015   ID:  [name] [name], DOB 12/31/1930, MRN [id number]  PCP:  Gara Kroner, MD    Chief Complaint  Patient presents with  . Coronary Artery Disease  . Hypertension      History of Present Illness: [name] [name] is a 81 y.o. male who presents for followup of his ASCAD. He has a history of ASCAD s/p CABG in 1997 with known atretic LIMA to LAD and 40% ostial LM, PVD s/p left iliac stent and renal artery stenosis s/p left renal artery stent, carotid artery stenosis s/p left CEA, dementia, HTN, CKD, noncardiac chest wall pain, dyslipidemia and COPD. Nuclear stress test in 2013 showed no ischemia.He has moderate pulmonary HTN from COPD. He has chronic chest wall pain that never goes away.  He describes it as a sharp sticking pain. Nuclear stress test showed no ischemia.  He has chronic DOE from his COPD which is stable.  He occasionally has some LE edema at night.  He denies any palpitations, dizziness or syncope.     Past Medical History  Diagnosis Date  . PVD (peripheral vascular disease) ([place])     s/p left renal artery stent and left iliac stent  . Hypertension   . Hyperlipemia   . GERD (gastroesophageal reflux disease)   . COPD (chronic obstructive pulmonary disease) ([place])   . Monoclonal gammopathy   . BPH (benign prostatic hyperplasia)   . DEMENTIA   . Alzheimer disease   . Memory loss   . CAD (coronary artery disease)     s/p CABG in 1997 with known atretic LIMA to LAD and 40% LM.    Marland Kitchen Chronic kidney disease (CKD), stage III (moderate)   . Carotid artery stenosis     s/p left CEA.  Carotid dopplers 09/2013 60-79% right ICA stenosis and 1-39% left ICA stenosis of left CEA  . Chronic chest wall pain   . Peptic ulcer disease   . Depression with anxiety   . Retinal artery occlusion   . On home oxygen therapy     at bedtime.      Past Surgical History  Procedure Laterality Date  . Coronary artery bypass  graft  1997  . Left cea  2002  . Back surgery  2011  . Cataract extraction w/phaco  09/18/2011    Procedure: CATARACT EXTRACTION PHACO AND INTRAOCULAR LENS PLACEMENT (IOC);  Surgeon: Elta Guadeloupe T. Gershon Crane, MD;  Location: [facility];  Service: Ophthalmology;  Laterality: Right;  CDE: 6.19  . Cardiac surgery    . Renal artery stent       Current Outpatient Prescriptions  Medication Sig Dispense Refill  . acetaminophen (TYLENOL) 500 MG tablet Take 1,000 mg by mouth every 6 (six) hours as needed for headache.    . albuterol (PROVENTIL HFA;VENTOLIN HFA) 108 (90 BASE) MCG/ACT inhaler Inhale 2 puffs into the lungs every 4 (four) hours as needed for wheezing or shortness of breath. 1 Inhaler 0  . arformoterol (BROVANA) 15 MCG/2ML NEBU Take 2 mLs (15 mcg total) by nebulization 2 (two) times daily. 120 mL 6  . aspirin 325 MG tablet Take 325 mg by mouth daily.      Marland Kitchen atorvastatin (LIPITOR) 10 MG tablet TAKE 1 TABLET DAILY. 90 tablet 1  . azelastine (ASTELIN) 0.1 % nasal spray Place 1-2 sprays into both nostrils 2 (two) times daily. Use  in each nostril as directed    . bisacodyl (DULCOLAX) 5 MG EC tablet Take 5 mg by mouth daily as needed for moderate constipation.    . budesonide (PULMICORT) 0.25 MG/2ML nebulizer solution Use one vial twice daily with brovana 120 mL 11  . calcitRIOL (ROCALTROL) 0.5 MCG capsule Take 0.5 mcg by mouth daily.    Marland Kitchen donepezil (ARICEPT) 10 MG tablet Take 1 tablet (10 mg total) by mouth at bedtime. 90 tablet 1  . feeding supplement, ENSURE ENLIVE, (ENSURE ENLIVE) LIQD Take 237 mLs by mouth 2 (two) times daily between meals. 30 Bottle 6  . finasteride (PROSCAR) 5 MG tablet Take 5 mg by mouth daily.     . fluticasone (FLONASE) 50 MCG/ACT nasal spray Place 2 sprays into both nostrils daily.    Marland Kitchen gabapentin (NEURONTIN) 300 MG capsule Take 1 capsule (300 mg total) by mouth 3 (three) times daily. 270 capsule 3  . guaiFENesin-dextromethorphan (ROBITUSSIN DM) 100-10 MG/5ML syrup Take 5 mLs by  mouth every 4 (four) hours as needed for cough. 118 mL 0  . lansoprazole (PREVACID) 30 MG capsule Take 1 capsule by mouth daily.    Marland Kitchen latanoprost (XALATAN) 0.005 % ophthalmic solution Place 1 drop into both eyes at bedtime.     . Memantine HCl ER (NAMENDA XR) 28 MG CP24 TAKE ONE CAPSULE BY MOUTH ONCE DAILY. (Patient taking differently: Take 1 capsule by mouth daily. ) 90 capsule 1  . metoCLOPramide (REGLAN) 10 MG tablet Take 10 mg by mouth 2 (two) times daily.    . metoprolol succinate (TOPROL-XL) 25 MG 24 hr tablet TAKE 1 TABLET EVERY DAY 90 tablet 0  . mirtazapine (REMERON) 15 MG tablet Take 15 mg by mouth at bedtime.     . Multiple Vitamin (MULTIVITAMIN WITH MINERALS) TABS tablet Take 1 tablet by mouth daily.    . nitroGLYCERIN (NITROSTAT) 0.4 MG SL tablet Place 1 tablet (0.4 mg total) under the tongue every 5 (five) minutes as needed for chest pain. 90 tablet 3  . polyethylene glycol (MIRALAX / GLYCOLAX) packet Take 17 g by mouth daily.    . Tamsulosin HCl (FLOMAX) 0.4 MG CAPS Take 0.4 mg by mouth daily.      No current facility-administered medications for this visit.    Allergies:   Review of patient's allergies indicates no known allergies.    Social History:  The patient  reports that he quit smoking about 36 years ago. His smoking use included Cigarettes. He has a 20 pack-year smoking history. He has never used smokeless tobacco. He reports that he does not drink alcohol or use illicit drugs.   Family History:  The patient's family history includes High Cholesterol in an other family member; High blood pressure in an other family member; Hypertension in his father and mother. There is no history of Anesthesia problems, Hypotension, Malignant hyperthermia, or Pseudochol deficiency.    ROS:  Please see the history of present illness.   Otherwise, review of systems are positive for none.   All other systems are reviewed and negative.    PHYSICAL EXAM: VS:  BP 138/72 mmHg  Pulse 61   Ht 5\' 7"  (1.702 m)  Wt 80.287 kg (177 lb)  BMI 27.72 kg/m2  SpO2 97% , BMI Body mass index is 27.72 kg/(m^2). GEN: Well nourished, well developed, in no acute distress HEENT: normal Neck: no JVD, carotid bruits, or masses Cardiac: RRR; no murmurs, rubs, or gallops,no edema  Respiratory:  clear to auscultation bilaterally,  normal work of breathing GI: soft, nontender, nondistended, + BS MS: no deformity or atrophy Skin: warm and dry, no rash Neuro:  Strength and sensation are intact Psych: euthymic mood, full affect   EKG:  EKG is not ordered today.    Recent Labs: 10/14/2014: Hemoglobin 11.6*; Platelets 203 10/15/2014: B Natriuretic Peptide 70.0 12/09/2014: ALT 14; BUN 19; Creatinine, Ser 1.92*; Potassium 5.0; Sodium 137    Lipid Panel    Component Value Date/Time   CHOL 213* 12/09/2014 1018   TRIG 238.0* 12/09/2014 1018   HDL 34.50* 12/09/2014 1018   CHOLHDL 6 12/09/2014 1018   VLDL 47.6* 12/09/2014 1018   LDLDIRECT 110.0 12/09/2014 1018      Wt Readings from Last 3 Encounters:  06/09/15 80.287 kg (177 lb)  05/24/15 80.287 kg (177 lb)  05/02/15 80.65 kg (177 lb 12.8 oz)    ASSESSMENT AND PLAN:  1. ASCAD s/p remote CABG with known atretic LIMA to LAD and 40% LM.  Continue ASA/BB/statin.. 2. HTN controlled today. Continue BB 3. Dyslipidemia -  Continue statin.   4. Carotid artery stenosis s/p left CEA with right carotid bruit and stable 60-70% right ICA stenosis-  followed by vascular surgery.  5. Renal artery stenosis s/p left renal artery stent 6. GERD 7. COPD 8. CKD stage 3 9  Chronic noncardiac CP - Lexiscan myoview showed no ischemia. His CP is very atypical and is sharp and only lasts a second. 10.  Moderate pulmonary HTN secondary to COPD - repeat echo 09/2015   Current medicines are reviewed at length with the patient today.  The patient does not have concerns regarding medicines.  The following changes have been made:  no change  Labs/ tests  ordered today: See above Assessment and Plan No orders of the defined types were placed in this encounter.     Disposition:   FU with me in 6 months  Signed, Sueanne Margarita, MD  06/09/2015 10:41 AM    [facility] [place], [place], [place]  [address] Phone: [phone]; Fax: [phone]

## 2015-06-09 NOTE — Patient Instructions (Signed)
Medication Instructions:  Your physician recommends that you continue on your current medications as directed. Please refer to the Current Medication list given to you today.   Labwork: TODAY: Lipids, Lfts  Testing/Procedures: None  Follow-Up: Your physician wants you to follow-up in: 6 months with Dr. Radford Pax. You will receive a reminder letter in the mail two months in advance. If you don't receive a letter, please call our office to schedule the follow-up appointment.   Any Other Special Instructions Will Be Listed Below (If Applicable).     If you need a refill on your cardiac medications before your next appointment, please call your pharmacy.

## 2015-06-13 ENCOUNTER — Ambulatory Visit (HOSPITAL_COMMUNITY)
Admission: RE | Admit: 2015-06-13 | Discharge: 2015-06-13 | Disposition: A | Discharge status: Home / Self Care | Payer: Commercial Managed Care - HMO | Source: Ambulatory Visit | Attending: Cardiology | Admitting: Cardiology

## 2015-06-13 DIAGNOSIS — I6523 Occlusion and stenosis of bilateral carotid arteries: Secondary | ICD-10-CM | POA: Diagnosis not present

## 2015-06-13 DIAGNOSIS — I6529 Occlusion and stenosis of unspecified carotid artery: Secondary | ICD-10-CM

## 2015-06-13 DIAGNOSIS — I2510 Atherosclerotic heart disease of native coronary artery without angina pectoris: Secondary | ICD-10-CM | POA: Diagnosis not present

## 2015-06-13 DIAGNOSIS — I129 Hypertensive chronic kidney disease with stage 1 through stage 4 chronic kidney disease, or unspecified chronic kidney disease: Secondary | ICD-10-CM | POA: Diagnosis not present

## 2015-06-13 DIAGNOSIS — N183 Chronic kidney disease, stage 3 (moderate): Secondary | ICD-10-CM | POA: Insufficient documentation

## 2015-06-13 DIAGNOSIS — I10 Essential (primary) hypertension: Secondary | ICD-10-CM | POA: Diagnosis not present

## 2015-06-13 DIAGNOSIS — E785 Hyperlipidemia, unspecified: Secondary | ICD-10-CM | POA: Insufficient documentation

## 2015-06-14 ENCOUNTER — Telehealth: Payer: Self-pay | Admitting: Cardiology

## 2015-06-14 DIAGNOSIS — I6529 Occlusion and stenosis of unspecified carotid artery: Secondary | ICD-10-CM

## 2015-06-14 DIAGNOSIS — E785 Hyperlipidemia, unspecified: Secondary | ICD-10-CM

## 2015-06-14 MED ORDER — ATORVASTATIN CALCIUM 20 MG PO TABS: 20.0000 mg | ORAL_TABLET | Freq: Every day | ORAL | Status: DC

## 2015-06-14 NOTE — Telephone Encounter (Signed)
Informed patient of results and verbal understanding expressed.  Repeat carotids ordered to be scheduled in 1 year. Patient agrees with treatment plan. 

## 2015-06-14 NOTE — Telephone Encounter (Signed)
Informed patient of results and verbal understanding expressed.  Instructed patient to INCREASE LIPITOR to 20 mg daily. FLP and ALT scheduled 08/01/2015. Patient agrees with treatment plan.

## 2015-06-14 NOTE — Telephone Encounter (Signed)
New message ° ° ° ° ° °Returning a call to the nurse to get test results °

## 2015-06-14 NOTE — Telephone Encounter (Signed)
-----   Message from Sueanne Margarita, MD sent at 06/14/2015 10:43 AM EST ----- Heterogeneous plaque, bilaterally.  Stable 60-79% RICA stenosis.  Stable 123456 LICA stenosis, S/P CEA with DPA.  Elevated left subclavian artery velocities.  Patent vertebral arteries with antegrade flow.  Followup study in 1 year.

## 2015-06-14 NOTE — Telephone Encounter (Signed)
-----   Message from Sueanne Margarita, MD sent at 06/11/2015  6:33 PM EST ----- Increase Lipitor to 20mg  daily and recheck FLP and ALT in 6 weeks

## 2015-06-16 DIAGNOSIS — J449 Chronic obstructive pulmonary disease, unspecified: Secondary | ICD-10-CM | POA: Diagnosis not present

## 2015-06-16 DIAGNOSIS — J441 Chronic obstructive pulmonary disease with (acute) exacerbation: Secondary | ICD-10-CM | POA: Diagnosis not present

## 2015-06-16 DIAGNOSIS — J9611 Chronic respiratory failure with hypoxia: Secondary | ICD-10-CM | POA: Diagnosis not present

## 2015-06-28 DIAGNOSIS — H401131 Primary open-angle glaucoma, bilateral, mild stage: Secondary | ICD-10-CM | POA: Diagnosis not present

## 2015-07-01 ENCOUNTER — Encounter (HOSPITAL_COMMUNITY): Payer: Self-pay | Admitting: *Deleted

## 2015-07-05 DIAGNOSIS — I10 Essential (primary) hypertension: Secondary | ICD-10-CM | POA: Diagnosis not present

## 2015-07-05 DIAGNOSIS — J441 Chronic obstructive pulmonary disease with (acute) exacerbation: Secondary | ICD-10-CM | POA: Diagnosis not present

## 2015-07-05 DIAGNOSIS — I739 Peripheral vascular disease, unspecified: Secondary | ICD-10-CM | POA: Diagnosis not present

## 2015-07-05 DIAGNOSIS — R0600 Dyspnea, unspecified: Secondary | ICD-10-CM | POA: Diagnosis not present

## 2015-07-05 DIAGNOSIS — D509 Iron deficiency anemia, unspecified: Secondary | ICD-10-CM | POA: Diagnosis not present

## 2015-07-11 ENCOUNTER — Ambulatory Visit (HOSPITAL_COMMUNITY)
Admission: RE | Admit: 2015-07-11 | Discharge: 2015-07-11 | Disposition: A | Discharge status: Home / Self Care | Payer: Commercial Managed Care - HMO | Source: Ambulatory Visit | Attending: Gastroenterology | Admitting: Gastroenterology

## 2015-07-11 ENCOUNTER — Encounter (HOSPITAL_COMMUNITY)
Admission: RE | Disposition: A | Discharge status: Home / Self Care | Payer: Self-pay | Source: Ambulatory Visit | Attending: Gastroenterology

## 2015-07-11 ENCOUNTER — Encounter (HOSPITAL_COMMUNITY): Payer: Self-pay

## 2015-07-11 ENCOUNTER — Ambulatory Visit (HOSPITAL_COMMUNITY): Admit: 2015-07-11 | Payer: Self-pay | Admitting: Gastroenterology

## 2015-07-11 ENCOUNTER — Ambulatory Visit (HOSPITAL_COMMUNITY): Payer: Commercial Managed Care - HMO | Admitting: Anesthesiology

## 2015-07-11 ENCOUNTER — Other Ambulatory Visit: Payer: Self-pay | Admitting: Gastroenterology

## 2015-07-11 DIAGNOSIS — I701 Atherosclerosis of renal artery: Secondary | ICD-10-CM | POA: Diagnosis not present

## 2015-07-11 DIAGNOSIS — Z7951 Long term (current) use of inhaled steroids: Secondary | ICD-10-CM | POA: Insufficient documentation

## 2015-07-11 DIAGNOSIS — Z79899 Other long term (current) drug therapy: Secondary | ICD-10-CM | POA: Insufficient documentation

## 2015-07-11 DIAGNOSIS — I2510 Atherosclerotic heart disease of native coronary artery without angina pectoris: Secondary | ICD-10-CM | POA: Diagnosis not present

## 2015-07-11 DIAGNOSIS — Z951 Presence of aortocoronary bypass graft: Secondary | ICD-10-CM | POA: Diagnosis not present

## 2015-07-11 DIAGNOSIS — Z955 Presence of coronary angioplasty implant and graft: Secondary | ICD-10-CM | POA: Insufficient documentation

## 2015-07-11 DIAGNOSIS — N400 Enlarged prostate without lower urinary tract symptoms: Secondary | ICD-10-CM | POA: Insufficient documentation

## 2015-07-11 DIAGNOSIS — D509 Iron deficiency anemia, unspecified: Secondary | ICD-10-CM | POA: Diagnosis not present

## 2015-07-11 DIAGNOSIS — H409 Unspecified glaucoma: Secondary | ICD-10-CM | POA: Insufficient documentation

## 2015-07-11 DIAGNOSIS — I129 Hypertensive chronic kidney disease with stage 1 through stage 4 chronic kidney disease, or unspecified chronic kidney disease: Secondary | ICD-10-CM | POA: Insufficient documentation

## 2015-07-11 DIAGNOSIS — I739 Peripheral vascular disease, unspecified: Secondary | ICD-10-CM | POA: Diagnosis not present

## 2015-07-11 DIAGNOSIS — K219 Gastro-esophageal reflux disease without esophagitis: Secondary | ICD-10-CM | POA: Insufficient documentation

## 2015-07-11 DIAGNOSIS — G309 Alzheimer's disease, unspecified: Secondary | ICD-10-CM | POA: Insufficient documentation

## 2015-07-11 DIAGNOSIS — I6523 Occlusion and stenosis of bilateral carotid arteries: Secondary | ICD-10-CM | POA: Insufficient documentation

## 2015-07-11 DIAGNOSIS — Z7982 Long term (current) use of aspirin: Secondary | ICD-10-CM | POA: Insufficient documentation

## 2015-07-11 DIAGNOSIS — K449 Diaphragmatic hernia without obstruction or gangrene: Secondary | ICD-10-CM | POA: Insufficient documentation

## 2015-07-11 DIAGNOSIS — Z9981 Dependence on supplemental oxygen: Secondary | ICD-10-CM | POA: Insufficient documentation

## 2015-07-11 DIAGNOSIS — Z87891 Personal history of nicotine dependence: Secondary | ICD-10-CM | POA: Diagnosis not present

## 2015-07-11 DIAGNOSIS — E785 Hyperlipidemia, unspecified: Secondary | ICD-10-CM | POA: Insufficient documentation

## 2015-07-11 DIAGNOSIS — G709 Myoneural disorder, unspecified: Secondary | ICD-10-CM | POA: Insufficient documentation

## 2015-07-11 DIAGNOSIS — N183 Chronic kidney disease, stage 3 (moderate): Secondary | ICD-10-CM | POA: Diagnosis not present

## 2015-07-11 DIAGNOSIS — F0280 Dementia in other diseases classified elsewhere without behavioral disturbance: Secondary | ICD-10-CM | POA: Insufficient documentation

## 2015-07-11 DIAGNOSIS — J449 Chronic obstructive pulmonary disease, unspecified: Secondary | ICD-10-CM | POA: Insufficient documentation

## 2015-07-11 DIAGNOSIS — D649 Anemia, unspecified: Secondary | ICD-10-CM | POA: Diagnosis not present

## 2015-07-11 HISTORY — DX: Cough: R05

## 2015-07-11 HISTORY — DX: Unspecified glaucoma: H40.9

## 2015-07-11 HISTORY — DX: Cough, unspecified: R05.9

## 2015-07-11 HISTORY — PX: COLONOSCOPY WITH PROPOFOL: SHX5780

## 2015-07-11 HISTORY — PX: ESOPHAGOGASTRODUODENOSCOPY (EGD) WITH PROPOFOL: SHX5813

## 2015-07-11 HISTORY — DX: Anemia, unspecified: D64.9

## 2015-07-11 HISTORY — DX: Myoneural disorder, unspecified: G70.9

## 2015-07-11 SURGERY — ESOPHAGOGASTRODUODENOSCOPY (EGD) WITH PROPOFOL
Anesthesia: Monitor Anesthesia Care

## 2015-07-11 SURGERY — EGD (ESOPHAGOGASTRODUODENOSCOPY)
Anesthesia: Monitor Anesthesia Care

## 2015-07-11 MED ORDER — SODIUM CHLORIDE 0.9 % IV SOLN
INTRAVENOUS | Status: DC
  Administered 2015-07-11: 1000 mL via INTRAVENOUS

## 2015-07-11 MED ORDER — EPHEDRINE SULFATE 50 MG/ML IJ SOLN
INTRAMUSCULAR | Status: DC | PRN
  Administered 2015-07-11: 10 mg via INTRAVENOUS

## 2015-07-11 MED ORDER — PROPOFOL 10 MG/ML IV BOLUS
INTRAVENOUS | Status: DC | PRN
  Administered 2015-07-11: 40 mg via INTRAVENOUS

## 2015-07-11 MED ORDER — SODIUM CHLORIDE 0.9 % IJ SOLN
INTRAMUSCULAR | Status: AC
  Filled 2015-07-11: qty 10

## 2015-07-11 MED ORDER — PROPOFOL 500 MG/50ML IV EMUL
INTRAVENOUS | Status: DC | PRN
  Administered 2015-07-11: 65 ug/kg/min via INTRAVENOUS

## 2015-07-11 MED ORDER — PROPOFOL 10 MG/ML IV BOLUS
INTRAVENOUS | Status: AC
  Filled 2015-07-11: qty 40

## 2015-07-11 MED ORDER — EPHEDRINE SULFATE 50 MG/ML IJ SOLN
INTRAMUSCULAR | Status: AC
  Filled 2015-07-11: qty 1

## 2015-07-11 SURGICAL SUPPLY — 25 items

## 2015-07-11 NOTE — Anesthesia Preprocedure Evaluation (Signed)
Anesthesia Evaluation  Patient identified by MRN, date of birth, ID band Patient awake    Reviewed: Allergy & Precautions, NPO status , Patient's Chart, lab work & pertinent test results, reviewed documented beta blocker date and time   Airway Mallampati: II  TM Distance: >3 FB Neck ROM: Full    Dental  (+) Dental Advisory Given   Pulmonary COPD, former smoker,    breath sounds clear to auscultation       Cardiovascular hypertension, Pt. on medications and Pt. on home beta blockers + CAD and + Peripheral Vascular Disease   Rhythm:Regular Rate:Normal  s/p CABG in 1997 with known atretic LIMA to LAD and 40% LM.    Neuro/Psych PSYCHIATRIC DISORDERS    GI/Hepatic PUD, GERD  Medicated,  Endo/Other    Renal/GU CRFRenal disease  negative genitourinary   Musculoskeletal   Abdominal   Peds negative pediatric ROS (+)  Hematology  (+) anemia ,   Anesthesia Other Findings Dementia  Reproductive/Obstetrics                             Lab Results  Component Value Date   WBC 7.2 10/14/2014   HGB 11.6* 10/14/2014   HCT 34.3* 10/14/2014   MCV 100.6* 10/14/2014   PLT 203 10/14/2014   Lab Results  Component Value Date   CREATININE 1.92* 12/09/2014   BUN 19 12/09/2014   NA 137 12/09/2014   K 5.0 12/09/2014   CL 104 12/09/2014   CO2 29 12/09/2014   Lab Results  Component Value Date   INR 1.08 01/30/2010   INR 1.0 09/16/2008     Anesthesia Physical Anesthesia Plan  ASA: III  Anesthesia Plan: MAC   Post-op Pain Management:    Induction: Intravenous  Airway Management Planned: Natural Airway and Nasal Cannula  Additional Equipment:   Intra-op Plan:   Post-operative Plan:   Informed Consent: I have reviewed the patients History and Physical, chart, labs and discussed the procedure including the risks, benefits and alternatives for the proposed anesthesia with the patient or  authorized representative who has indicated his/her understanding and acceptance.     Plan Discussed with: CRNA  Anesthesia Plan Comments:         Anesthesia Quick Evaluation

## 2015-07-11 NOTE — Transfer of Care (Signed)
Immediate Anesthesia Transfer of Care Note  Patient: [name] [name]  Procedure(s) Performed: Procedure(s): ESOPHAGOGASTRODUODENOSCOPY (EGD) WITH PROPOFOL (N/A) COLONOSCOPY WITH PROPOFOL (N/A)  Patient Location: PACU and Endoscopy Unit  Anesthesia Type:MAC  Level of Consciousness: awake and patient cooperative  Airway & Oxygen Therapy: Patient Spontanous Breathing and Patient connected to nasal cannula oxygen  Post-op Assessment: Report given to RN and Post -op Vital signs reviewed and stable  Post vital signs: Reviewed and stable  Last Vitals:  Filed Vitals:   07/11/15 0847 07/11/15 1000  BP: 189/72 121/41  Pulse: 66   Temp: 36.6 C 36.4 C  Resp: 17 18    Complications: No apparent anesthesia complications

## 2015-07-11 NOTE — Discharge Instructions (Addendum)
No repeat colon due to age unless bleeding or other problemMonitored Anesthesia Care Monitored anesthesia care is an anesthesia service for a medical procedure. Anesthesia is the loss of the ability to feel pain. It is produced by medicines called anesthetics. It may affect a small area of your body (local anesthesia), a large area of your body (regional anesthesia), or your entire body (general anesthesia). The need for monitored anesthesia care depends your procedure, your condition, and the potential need for regional or general anesthesia. It is often provided during procedures where:   General anesthesia may be needed if there are complications. This is because you need special care when you are under general anesthesia.   You will be under local or regional anesthesia. This is so that you are able to have higher levels of anesthesia if needed.   You will receive calming medicines (sedatives). This is especially the case if sedatives are given to put you in a semi-conscious state of relaxation (deep sedation). This is because the amount of sedative needed to produce this state can be hard to predict. Too much of a sedative can produce general anesthesia. Monitored anesthesia care is performed by one or more health care providers who have special training in all types of anesthesia. You will need to meet with these health care providers before your procedure. During this meeting, they will ask you about your medical history. They will also give you instructions to follow. (For example, you will need to stop eating and drinking before your procedure. You may also need to stop or change medicines you are taking.) During your procedure, your health care providers will stay with you. They will:   Watch your condition. This includes watching your blood pressure, breathing, and level of pain.   Diagnose and treat problems that occur.   Give medicines if they are needed. These may include calming  medicines (sedatives) and anesthetics.   Make sure you are comfortable.  Having monitored anesthesia care does not necessarily mean that you will be under anesthesia. It does mean that your health care providers will be able to manage anesthesia if you need it or if it occurs. It also means that you will be able to have a different type of anesthesia than you are having if you need it. When your procedure is complete, your health care providers will continue to watch your condition. They will make sure any medicines wear off before you are allowed to go home.    This information is not intended to replace advice given to you by your health care provider. Make sure you discuss any questions you have with your health care provider.   Document Released: 04/04/2005 Document Revised: 07/30/2014 Document Reviewed: 08/20/2012 Elsevier Interactive Patient Education 2016 [place]. Esophagogastroduodenoscopy, Care After Refer to this sheet in the next few weeks. These instructions provide you with information about caring for yourself after your procedure. Your health care provider may also give you more specific instructions. Your treatment has been planned according to current medical practices, but problems sometimes occur. Call your health care provider if you have any problems or questions after your procedure. WHAT TO EXPECT AFTER THE PROCEDURE After your procedure, it is typical to feel:  Soreness in your throat.  Pain with swallowing.  Sick to your stomach (nauseous).  Bloated.  Dizzy.  Fatigued. HOME CARE INSTRUCTIONS  Do not eat or drink anything until the numbing medicine (local anesthetic) has worn off and your gag reflex has returned.  You will know that the local anesthetic has worn off when you can swallow comfortably.  Do not drive or operate machinery until directed by your health care provider.  Take medicines only as directed by your health care provider. SEEK MEDICAL  CARE IF:   You cannot stop coughing.  You are not urinating at all or less than usual. SEEK IMMEDIATE MEDICAL CARE IF:  You have difficulty swallowing.  You cannot eat or drink.  You have worsening throat or chest pain.  You have dizziness or lightheadedness or you faint.  You have nausea or vomiting.  You have chills.  You have a fever.  You have severe abdominal pain.  You have black, tarry, or bloody stools.   This information is not intended to replace advice given to you by your health care provider. Make sure you discuss any questions you have with your health care provider.   Document Released: 06/25/2012 Document Revised: 07/30/2014 Document Reviewed: 06/25/2012 Elsevier Interactive Patient Education 2016 [employer] Colonoscopy, Care After Refer to this sheet in the next few weeks. These instructions provide you with information on caring for yourself after your procedure. Your health care provider may also give you more specific instructions. Your treatment has been planned according to current medical practices, but problems sometimes occur. Call your health care provider if you have any problems or questions after your procedure. WHAT TO EXPECT AFTER THE PROCEDURE  After your procedure, it is typical to have the following:  A small amount of blood in your stool.  Moderate amounts of gas and mild abdominal cramping or bloating. HOME CARE INSTRUCTIONS  Do not drive, operate machinery, or sign important documents for 24 hours.  You may shower and resume your regular physical activities, but move at a slower pace for the first 24 hours.  Take frequent rest periods for the first 24 hours.  Walk around or put a warm pack on your abdomen to help reduce abdominal cramping and bloating.  Drink enough fluids to keep your urine clear or pale yellow.  You may resume your normal diet as instructed by your health care provider. Avoid heavy or fried foods that are  hard to digest.  Avoid drinking alcohol for 24 hours or as instructed by your health care provider.  Only take over-the-counter or prescription medicines as directed by your health care provider.  If a tissue sample (biopsy) was taken during your procedure:  Do not take aspirin or blood thinners for 7 days, or as instructed by your health care provider.  Do not drink alcohol for 7 days, or as instructed by your health care provider.  Eat soft foods for the first 24 hours. SEEK MEDICAL CARE IF: You have persistent spotting of blood in your stool 2-3 days after the procedure. SEEK IMMEDIATE MEDICAL CARE IF:  You have more than a small spotting of blood in your stool.  You pass large blood clots in your stool.  Your abdomen is swollen (distended).  You have nausea or vomiting.  You have a fever.  You have increasing abdominal pain that is not relieved with medicine.   This information is not intended to replace advice given to you by your health care provider. Make sure you discuss any questions you have with your health care provider.   Document Released: 02/21/2004 Document Revised: 04/29/2013 Document Reviewed: 03/16/2013 Elsevier Interactive Patient Education [employer].

## 2015-07-11 NOTE — Op Note (Signed)
[facility] [place], [address]   COLONOSCOPY PROCEDURE REPORT  PATIENT: [name] [name], [name] [name]  MR#: [id number] BIRTHDATE: 11-May-1931 , 84  yrs. old GENDER: male ENDOSCOPIST: Laurence Spates, MD REFERRED BY:  Dr  Moreen Fowler PROCEDURE DATE:  07-24-2015 PROCEDURE:   Colonoscopy ASA CLASS:   class III INDICATIONS:iron deficiency anemia MEDICATIONS: propofol 215 mg for both procedures anesthesia  DESCRIPTION OF PROCEDURE:   After the risks and benefits and of the procedure were explained, informed consent was obtained.  igital exam was normal         The Pentax Ped Colon [id number]  endoscope was introduced through the anus and advanced to the    .  The quality of the prep was  good      .  The instrument was then slowly withdrawn as the colon was fully examined. Estimated blood loss is zero unless otherwise noted in this procedure report. Theappendiceal orifice and ICV were normal. The scope was withdrawn. No polyps, AVMs, diverticula or other lesions were seen. Internal hemorrhoids were seen in the rectum in the retro flex view.The scope was withdrawn the patient tolerated the procedure well   The scope was then withdrawn from the patient and the procedure completed.  WITHDRAWAL TIME: 11 minutes  COMPLICATIONS: There were no immediate complications. ENDOSCOPIC IMPRESSION: 1. Iron Deficiency Anemia no findings on colonoscopy RECOMMENDATIONS: continue oral iron replacementand follow-up in the office in 6 months  REPEAT EXAM:  cc:Dr Swayne  _______________________________ eSignedLaurence Spates, MD Jul 24, 2015 10:11 AM   CPT CODES: ICD CODES:  The ICD and CPT codes recommended by this software are interpretations from the data that the clinical staff has captured with the software.  The verification of the translation of this report to the ICD and CPT codes and modifiers is the sole responsibility of the health care institution and  practicing physician where this report was generated.  [place]. will not be held responsible for the validity of the ICD and CPT codes included on this report.  AMA assumes no liability for data contained or not contained herein. CPT is a Designer, television/film set of the [employer].

## 2015-07-11 NOTE — Anesthesia Postprocedure Evaluation (Signed)
Anesthesia Post Note  Patient: [name] [name]  Procedure(s) Performed: Procedure(s) (LRB): ESOPHAGOGASTRODUODENOSCOPY (EGD) WITH PROPOFOL (N/A) COLONOSCOPY WITH PROPOFOL (N/A)  Patient location during evaluation: PACU Anesthesia Type: MAC Level of consciousness: awake and alert Pain management: pain level controlled Vital Signs Assessment: post-procedure vital signs reviewed and stable Respiratory status: spontaneous breathing, nonlabored ventilation, respiratory function stable and patient connected to nasal cannula oxygen Cardiovascular status: stable and blood pressure returned to baseline Anesthetic complications: no    Last Vitals:  Filed Vitals:   07/11/15 0847 07/11/15 1000  BP: 189/72 121/41  Pulse: 66   Temp: 36.6 C 36.4 C  Resp: 17 18    Last Pain: There were no vitals filed for this visit.               Effie Berkshire

## 2015-07-11 NOTE — H&P (Signed)
Subjective:   Patient is a 81 y.o. male presents with iron deficiency anemia. He has been taking iron twice-daily and his iron level has improved. It had dropped to 10.6 with a low iron saturation. His last EGD and colonoscopy was 5 years ago. He sees cardiology regularly. He has a history of CAD status post CABG, peripheral vascular disease, hypertension, renal artery stenosis, COPD requiring 02 at night, and dementia. He also has a history of chronic constipation gastroparesis etc. After discussion with he and his wife in the office, it was felt that he did need at least one more endoscopic evaluation. It was felt that this could best be done in the hospital with anesthesia and propofol.. Procedure including risks and benefits discussed in office.  Patient Active Problem List   Diagnosis Date Noted  . COPD GOLD II 04/26/2015  . Chronic respiratory failure ([place]) 02/25/2015  . Carotid stenosis, asymptomatic 01/25/2015  . Hyperkalemia 10/16/2014  . Delirium 10/15/2014  . Acute renal failure superimposed on stage 3 chronic kidney disease ([place]) 10/15/2014  . Chronic kidney disease (CKD), stage III (moderate)   . Chronic chest wall pain   . Dyspnea 01/27/2014  . Paresthesia 12/22/2013  . PVD (peripheral vascular disease) ([place])   . GERD (gastroesophageal reflux disease)   . CAD (coronary artery disease)   . Alzheimer disease   . Memory loss   . PAIN IN JOINT, MULTIPLE SITES 04/19/2009  . Allergic rhinitis 08/16/2008  . Dyslipidemia 03/26/2007  . MONOCLONAL GAMMOPATHY 03/26/2007  . Essential hypertension 03/26/2007  . RENAL INSUFFICIENCY 03/26/2007   Past Medical History  Diagnosis Date  . PVD (peripheral vascular disease) ([place])     s/p left renal artery stent and left iliac stent  . Hypertension   . Hyperlipemia   . GERD (gastroesophageal reflux disease)   . COPD (chronic obstructive pulmonary disease) ([place])   . Monoclonal gammopathy   . BPH (benign prostatic hyperplasia)   .  Memory loss   . CAD (coronary artery disease)     s/p CABG in 1997 with known atretic LIMA to LAD and 40% LM.    Marland Kitchen Chronic kidney disease (CKD), stage III (moderate)   . Carotid artery stenosis     s/p left CEA.  Carotid dopplers 09/2013 60-79% right ICA stenosis and 1-39% left ICA stenosis of left CEA  . Chronic chest wall pain   . Peptic ulcer disease   . Depression with anxiety   . Retinal artery occlusion   . On home oxygen therapy     at bedtime-2 L/m nasally..    . Neuromuscular disorder ([facility])     fingertips numbness"  . DEMENTIA     "short term memory issues"  . Alzheimer disease   . Cough     cough with productive white to yellow sputum.  Marland Kitchen Anemia   . Glaucoma     bilateral    Past Surgical History  Procedure Laterality Date  . Left cea  2002    carotid stent-left-   . Cataract extraction w/phaco  09/18/2011    Procedure: CATARACT EXTRACTION PHACO AND INTRAOCULAR LENS PLACEMENT (IOC);  Surgeon: Elta Guadeloupe T. Gershon Crane, MD;  Location: [facility];  Service: Ophthalmology;  Laterality: Right;  CDE: 6.19  . Cardiac surgery    . Renal artery stent    . Coronary artery bypass graft  1997    "nerve pain at area"  . Back surgery  2011    lower back -disc    Prescriptions  prior to admission  Medication Sig Dispense Refill Last Dose  . acetaminophen (TYLENOL) 500 MG tablet Take 1,000 mg by mouth every 6 (six) hours as needed for headache.   Past Week at Unknown time  . albuterol (PROVENTIL HFA;VENTOLIN HFA) 108 (90 BASE) MCG/ACT inhaler Inhale 2 puffs into the lungs every 4 (four) hours as needed for wheezing or shortness of breath. 1 Inhaler 0 Past Week at Unknown time  . arformoterol (BROVANA) 15 MCG/2ML NEBU Take 2 mLs (15 mcg total) by nebulization 2 (two) times daily. 120 mL 6 07/11/2015 at 0730  . aspirin 325 MG tablet Take 325 mg by mouth daily.     Past Week at Unknown time  . atorvastatin (LIPITOR) 20 MG tablet Take 1 tablet (20 mg total) by mouth daily. 90 tablet 3 07/10/2015 at  1700  . budesonide (PULMICORT) 0.25 MG/2ML nebulizer solution Use one vial twice daily with brovana 120 mL 11 Past Week at Unknown time  . calcitRIOL (ROCALTROL) 0.5 MCG capsule Take 0.5 mcg by mouth daily.   Past Week at Unknown time  . Cholecalciferol (VITAMIN D3) 5000 UNITS TABS Take 5,000 Units by mouth every other day.   Past Week at Unknown time  . docusate sodium (COLACE) 100 MG capsule Take 300 mg by mouth daily as needed for mild constipation.   Past Week at Unknown time  . donepezil (ARICEPT) 10 MG tablet Take 1 tablet (10 mg total) by mouth at bedtime. 90 tablet 1 Past Week at Unknown time  . feeding supplement, ENSURE ENLIVE, (ENSURE ENLIVE) LIQD Take 237 mLs by mouth 2 (two) times daily between meals. 30 Bottle 6 Taking  . ferrous sulfate 325 (65 FE) MG tablet Take 325 mg by mouth 2 (two) times daily with a meal.   Past Week at Unknown time  . finasteride (PROSCAR) 5 MG tablet Take 5 mg by mouth daily.    07/10/2015 at 1700  . fluticasone (FLONASE) 50 MCG/ACT nasal spray Place 2 sprays into both nostrils daily as needed for allergies.    Past Week at Unknown time  . gabapentin (NEURONTIN) 300 MG capsule Take 1 capsule (300 mg total) by mouth 3 (three) times daily. 270 capsule 3 Past Week at Unknown time  . guaiFENesin-dextromethorphan (ROBITUSSIN DM) 100-10 MG/5ML syrup Take 5 mLs by mouth every 4 (four) hours as needed for cough. 118 mL 0 Past Week at Unknown time  . ipratropium (ATROVENT) 0.06 % nasal spray Place 1 spray into both nostrils 4 (four) times daily.   Past Week at Unknown time  . lansoprazole (PREVACID) 30 MG capsule Take 1 capsule by mouth daily.   07/11/2015 at 0730  . latanoprost (XALATAN) 0.005 % ophthalmic solution Place 1 drop into both eyes at bedtime.    Past Week at Unknown time  . Memantine HCl ER (NAMENDA XR) 28 MG CP24 TAKE ONE CAPSULE BY MOUTH ONCE DAILY. (Patient taking differently: Take 1 capsule by mouth daily. ) 90 capsule 1 07/10/2015 at 1700  .  metoCLOPramide (REGLAN) 10 MG tablet Take 10 mg by mouth daily before breakfast.    Past Week at Unknown time  . metoprolol succinate (TOPROL-XL) 25 MG 24 hr tablet TAKE 1 TABLET EVERY DAY 90 tablet 0 07/11/2015 at 0730  . mirtazapine (REMERON) 30 MG tablet Take 30 mg by mouth at bedtime.   Past Week at Unknown time  . Multiple Vitamin (MULTIVITAMIN WITH MINERALS) TABS tablet Take 1 tablet by mouth daily.   07/10/2015 at 0900  .  nitroGLYCERIN (NITROSTAT) 0.4 MG SL tablet Place 1 tablet (0.4 mg total) under the tongue every 5 (five) minutes as needed for chest pain. 90 tablet 3 Past Month at Unknown time  . polyethylene glycol (MIRALAX / GLYCOLAX) packet Take 17 g by mouth daily as needed for moderate constipation.    Past Week at Unknown time  . Tamsulosin HCl (FLOMAX) 0.4 MG CAPS Take 0.8 mg by mouth at bedtime.    Past Week at Unknown time   No Known Allergies  Social History  Substance Use Topics  . Smoking status: Former Smoker -- 0.50 packs/day for 40 years    Types: Cigarettes    Quit date: 07/23/1978  . Smokeless tobacco: Never Used  . Alcohol Use: No    Family History  Problem Relation Age of Onset  . Anesthesia problems Neg Hx   . Hypotension Neg Hx   . Malignant hyperthermia Neg Hx   . Pseudochol deficiency Neg Hx   . High blood pressure    . High Cholesterol    . Hypertension Father   . Hypertension Mother      Objective:   Patient Vitals for the past 8 hrs:  BP Temp Temp src Pulse Resp SpO2 Height Weight  07/11/15 0847 (!) 189/72 mmHg 97.8 F (36.6 C) Oral 66 17 96 % 5\' 7"  (1.702 m) 77.111 kg (170 lb)         See MD Preop evaluation      Assessment:   1. Iron deficiency anemia 2. Chronic medical problems as noted above   Plan:   We will proceed with EGD and colonoscopy propofol anesthesia. These procedures have been discussed in detail the patient and his wife in the office.

## 2015-07-11 NOTE — Op Note (Signed)
[facility] [place], [id number]   ENDOSCOPY PROCEDURE REPORT  PATIENT: [name] [name], [name] [name]  MR#: [id number] BIRTHDATE: Jan 18, 1931 , 84  yrs. old GENDER: male ENDOSCOPIST:Tabius Oletta Lamas, MD REFERRED BY: Dr Moreen Fowler PROCEDURE DATE:  07/11/2015 PROCEDURE:   EGD ASA CLASS:    class III INDICATIONS: chronic iron deficiency anemia MEDICATION: see colonoscopy note TOPICAL ANESTHETIC:  DESCRIPTION OF PROCEDURE:   After the risks and benefits of the procedure were explained, informed consent was obtained.  The [place] [id number]  endoscope was introduced through the mouth  and advanced to the second portion of the duodenum .  The instrument was slowly withdrawn as the mucosa was fully examined. Estimated blood loss is zero unless otherwise noted in this procedure report. The scope was withdrawn. The duodenum was seen well. That 2nd duodenum duodenal bulb for normal. No ulcerations or signs of recent bleeding were seen. The pyloric channel and gastric antrum were normal.The fundus and cardio were reviewedin the forward and retro flex view and were normal. There was a hiatal hernia with a wwidelypatent junction. There was no esophagitis or varices. The esophagus was normal. The scope was withdrawn the patient tolerated the procedure well.    The scope was then withdrawn from the patient and the procedure completed.  COMPLICATIONS: There were no immediate complications.  ENDOSCOPIC IMPRESSION: 1. Iron Deficiency Anemia. No obvious source on upper G.I. endoscopy 2. Hiatal hernia with widely patent GE junction RECOMMENDATIONS: Colonoscopy   _______________________________ eSignedLaurence Spates, MD 07/11/2015 10:07 AM     cc: Dr Moreen Fowler  CPT CODES: ICD CODES:  The ICD and CPT codes recommended by this software are interpretations from the data that the clinical staff has captured with the software.  The verification of the translation of  this report to the ICD and CPT codes and modifiers is the sole responsibility of the health care institution and practicing physician where this report was generated.  [employer] will not be held responsible for the validity of the ICD and CPT codes included on this report.  AMA assumes no liability for data contained or not contained herein. CPT is a Designer, television/film set of the [employer].  PATIENT NAME:  [name] [name], [name] [name] MR#: [id number]

## 2015-07-12 ENCOUNTER — Encounter (HOSPITAL_COMMUNITY): Payer: Self-pay | Admitting: Gastroenterology

## 2015-07-16 DIAGNOSIS — J9611 Chronic respiratory failure with hypoxia: Secondary | ICD-10-CM | POA: Diagnosis not present

## 2015-07-16 DIAGNOSIS — J441 Chronic obstructive pulmonary disease with (acute) exacerbation: Secondary | ICD-10-CM | POA: Diagnosis not present

## 2015-07-16 DIAGNOSIS — J449 Chronic obstructive pulmonary disease, unspecified: Secondary | ICD-10-CM | POA: Diagnosis not present

## 2015-07-28 DIAGNOSIS — J449 Chronic obstructive pulmonary disease, unspecified: Secondary | ICD-10-CM | POA: Diagnosis not present

## 2015-07-28 DIAGNOSIS — I209 Angina pectoris, unspecified: Secondary | ICD-10-CM | POA: Diagnosis not present

## 2015-07-28 DIAGNOSIS — N183 Chronic kidney disease, stage 3 (moderate): Secondary | ICD-10-CM | POA: Diagnosis not present

## 2015-07-28 DIAGNOSIS — I10 Essential (primary) hypertension: Secondary | ICD-10-CM | POA: Diagnosis not present

## 2015-07-28 DIAGNOSIS — F0390 Unspecified dementia without behavioral disturbance: Secondary | ICD-10-CM | POA: Diagnosis not present

## 2015-07-28 DIAGNOSIS — I739 Peripheral vascular disease, unspecified: Secondary | ICD-10-CM | POA: Diagnosis not present

## 2015-07-28 DIAGNOSIS — I2510 Atherosclerotic heart disease of native coronary artery without angina pectoris: Secondary | ICD-10-CM | POA: Diagnosis not present

## 2015-07-28 DIAGNOSIS — I77811 Abdominal aortic ectasia: Secondary | ICD-10-CM | POA: Diagnosis not present

## 2015-07-28 DIAGNOSIS — J069 Acute upper respiratory infection, unspecified: Secondary | ICD-10-CM | POA: Diagnosis not present

## 2015-08-01 ENCOUNTER — Other Ambulatory Visit: Payer: Commercial Managed Care - HMO

## 2015-08-04 ENCOUNTER — Other Ambulatory Visit: Payer: Self-pay | Admitting: Cardiology

## 2015-08-04 ENCOUNTER — Other Ambulatory Visit (INDEPENDENT_AMBULATORY_CARE_PROVIDER_SITE_OTHER): Payer: Commercial Managed Care - HMO | Admitting: *Deleted

## 2015-08-04 DIAGNOSIS — E785 Hyperlipidemia, unspecified: Secondary | ICD-10-CM | POA: Diagnosis not present

## 2015-08-04 LAB — LIPID PANEL
CHOLESTEROL: 139 mg/dL (ref 125–200)
HDL: 27 mg/dL — AB (ref >=40)
LDL CALC: 74 mg/dL (ref <130)
TRIGLYCERIDES: 188 mg/dL — AB (ref <150)
Total CHOL/HDL Ratio: 5.1 Ratio — ABNORMAL HIGH (ref <=5.0)
VLDL: 38 mg/dL — AB (ref <30)

## 2015-08-04 LAB — ALT: ALT: 13 U/L (ref 9–46)

## 2015-08-05 ENCOUNTER — Telehealth: Payer: Self-pay | Admitting: *Deleted

## 2015-08-05 DIAGNOSIS — I2583 Coronary atherosclerosis due to lipid rich plaque: Principal | ICD-10-CM

## 2015-08-05 DIAGNOSIS — I2510 Atherosclerotic heart disease of native coronary artery without angina pectoris: Secondary | ICD-10-CM

## 2015-08-05 DIAGNOSIS — R0600 Dyspnea, unspecified: Secondary | ICD-10-CM | POA: Diagnosis not present

## 2015-08-05 DIAGNOSIS — J441 Chronic obstructive pulmonary disease with (acute) exacerbation: Secondary | ICD-10-CM | POA: Diagnosis not present

## 2015-08-05 DIAGNOSIS — I10 Essential (primary) hypertension: Secondary | ICD-10-CM | POA: Diagnosis not present

## 2015-08-05 DIAGNOSIS — I739 Peripheral vascular disease, unspecified: Secondary | ICD-10-CM | POA: Diagnosis not present

## 2015-08-05 NOTE — Telephone Encounter (Signed)
DPR on file for wife who has been notified of lab results and recommendations. gave some food ideas for pt. LIVER/LIPID PANEL 5/15. wife agreeable to plan of care.

## 2015-08-11 ENCOUNTER — Other Ambulatory Visit: Payer: Self-pay | Admitting: Neurology

## 2015-08-16 DIAGNOSIS — J449 Chronic obstructive pulmonary disease, unspecified: Secondary | ICD-10-CM | POA: Diagnosis not present

## 2015-08-16 DIAGNOSIS — J9611 Chronic respiratory failure with hypoxia: Secondary | ICD-10-CM | POA: Diagnosis not present

## 2015-08-16 DIAGNOSIS — J441 Chronic obstructive pulmonary disease with (acute) exacerbation: Secondary | ICD-10-CM | POA: Diagnosis not present

## 2015-08-25 ENCOUNTER — Ambulatory Visit (INDEPENDENT_AMBULATORY_CARE_PROVIDER_SITE_OTHER): Payer: Commercial Managed Care - HMO | Admitting: Internal Medicine

## 2015-08-25 ENCOUNTER — Encounter: Payer: Self-pay | Admitting: Internal Medicine

## 2015-08-25 ENCOUNTER — Ambulatory Visit (INDEPENDENT_AMBULATORY_CARE_PROVIDER_SITE_OTHER)
Admission: RE | Admit: 2015-08-25 | Discharge: 2015-08-25 | Disposition: A | Discharge status: Home / Self Care | Payer: Commercial Managed Care - HMO | Source: Ambulatory Visit | Attending: Internal Medicine | Admitting: Internal Medicine

## 2015-08-25 VITALS — BP 144/70 | HR 62 | Ht 69.0 in | Wt 175.6 lb

## 2015-08-25 DIAGNOSIS — J449 Chronic obstructive pulmonary disease, unspecified: Secondary | ICD-10-CM

## 2015-08-25 DIAGNOSIS — R058 Other specified cough: Secondary | ICD-10-CM

## 2015-08-25 DIAGNOSIS — J9611 Chronic respiratory failure with hypoxia: Secondary | ICD-10-CM

## 2015-08-25 DIAGNOSIS — R05 Cough: Secondary | ICD-10-CM

## 2015-08-25 NOTE — Progress Notes (Signed)
Subjective:    Patient ID: [name] [name], male    DOB: 1930-12-27  MRN: [id number]    Brief patient profile:  82 year old male quit smoking in 1980s  Followed previously by Dr Joya Gaskins for copd with gold III criteria April 2016    History of Present Illness  02/25/2015 Follow up : COPD  NP ov    Patient was recently seen for a COPD flare. He was treated with a Z-Pak and prednisone. He did improve, however , symptoms continued to linger and was called in amoxicillin 1 week prior to OV   He has a few days left of this. He is starting to feel better with decreased cough, congestion. He is now on oxygen 2 L. Wife requests a Marine scientist. rec Finish Antibiotics as directed.  Mucinex DM Twice daily  As needed  Cough/congestion  Wear Oxygen 2l/m   Order for portable oxygen concentrator .     04/26/2015  Ext ov/Jhoselin Crume establish care re: GOLD III copd/ noct 02 dep / maint on brovana but no bud Chief Complaint  Patient presents with  . Follow-up    Pt states his breathing is overall doing well. He still c/o chest congestion, but not coughing much.   using 02 prn   A few hours a day not with exertion, not necessarily at hs  Worse swelling, has kidney doctor and heart doctor but unsure who to call re swelling  Continue 02 2lpm at bedtime for now  - ok to leave off during the day and just use with heavy exertion  Prednisone 10 mg take  4 each am x 2 days,   2 each am x 2 days,  1 each am x 2 days and stop  Add budesonide 0.25 mg twice daily with nebulizer treatments  Let your kidney doctor know about the swelling     05/24/2015 Follow up : GOLD III COPD , noct O2  Pt returns with his wife for 1 month follow up  He has moderate dementia on aricept and namenda.  Last ov with COPD flare , tx w/ pred taper .  Budesonide Neb was added to his Brovana neb regimen  She says he is doing better w/ decreased cough and wheezing.  Does have drippy nose on/off.  PVX and Flu shot are  utd Discussed prevnar vaccine for today  rec No change rx   08/25/2015  f/u ov/Masiyah Engen re: COPD III copd/ noct 02 and brovana/ bud maint  Chief Complaint  Patient presents with  . Follow-up    Cough has not improved- coughing up min yellow sputum at times. He also c/o constant rhinitis and PND.   not that much noct or am cough mostly daytime despite "constant rhinitis"  Not limited by breathing from desired activities  But very sedentary   No obvious day to day or daytime variability or assoc   cp or chest tightness, subjective wheeze or overt sinus or hb symptoms. No unusual exp hx or h/o childhood pna/ asthma or knowledge of premature birth.  Sleeping ok without nocturnal  or early am exacerbation  of respiratory  c/o's or need for noct saba. Also denies any obvious fluctuation of symptoms with weather or environmental changes or other aggravating or alleviating factors except as outlined above   Current Medications, Allergies, Complete Past Medical History, Past Surgical History, Family History, and Social History were reviewed in [employer] record.  ROS  The following are not active complaints unless  bolded sore throat, dysphagia, dental problems, itching, sneezing,  nasal congestion or excess/ purulent secretions, ear ache,   fever, chills, sweats, unintended wt loss, classically pleuritic or exertional cp, hemoptysis,  orthopnea pnd or leg swelling, presyncope, palpitations, abdominal pain, anorexia, nausea, vomiting, diarrhea  or change in bowel or bladder habits, change in stools or urine, dysuria,hematuria,  rash, arthralgias, visual complaints, headache, numbness, weakness or ataxia or problems with walking or coordination,  change in mood/affect or memory.              Objective:   Physical Exam  GEN: A/Ox3; pleasant , NAD, elderly stoic amb bm nad      Wt Readings from Last 3 Encounters:  08/25/15 175 lb 9.6 oz (79.652 kg)  07/11/15 170 lb (77.111  kg)  06/09/15 177 lb (80.287 kg)    Vital signs reviewed    HEENT:  [place]/AT,  EACs-clear, TMs-wnl, NOSE-clear, THROAT-clear, no lesions, no postnasal drip or exudate noted. - edentulous with dentures in place  NECK:  Supple w/ fair ROM; no JVD; normal carotid impulses w/o bruits; no thyromegaly or nodules palpated; no lymphadenopathy.  RESP   Completely clear bilaterally   CARD:  RRR, no m/r/g  , no peripheral edema, pulses intact, no cyanosis or clubbing - no sign edema   GI:   Soft & nt; nml bowel sounds; no organomegaly or masses detected.  Musco: Warm bil, no deformities or joint swelling noted.   Neuro: alert, no focal deficits noted.    Skin: Warm, no lesions or rashes      CXR PA and Lateral:   08/25/2015 :    I personally reviewed images and agree with radiology impression as follows:   Stable coarse interstitial densities throughout both lungs, right greater than left, most consistent with scarring or pulmonary fibrosis. No significant changes noted compared to prior exam.        Assessment & Plan:

## 2015-08-25 NOTE — Patient Instructions (Addendum)
Prevacid 30 mg Take 30- 60 min before your first and last meals of the day    GERD (REFLUX)  is an extremely common cause of respiratory symptoms just like yours , many times with no obvious heartburn at all.   It can be treated with medication, but also with lifestyle changes including elevation of the head of your bed (ideally with 6 inch  bed blocks),  Smoking cessation, avoidance of late meals, excessive alcohol, and avoid fatty foods, chocolate, peppermint, colas, red wine, and acidic juices such as orange juice.  NO MINT OR MENTHOL PRODUCTS SO NO COUGH DROPS  USE SUGARLESS CANDY INSTEAD (Jolley ranchers or Stover's or Life Savers) or even ice chips will also do - the key is to swallow to prevent all throat clearing. NO OIL BASED VITAMINS - use powdered substitutes.    If not better > See [name] NP w/in 2 weeks or next available with all your medications, even over the counter meds, separated in two separate bags, the ones you take no matter what vs the ones you stop once you feel better and take only as needed when you feel you need them.   [name]  will generate for you a new user friendly medication calendar that will put [place] all on the same page re: your medication use.     Without this process, it simply isn't possible to assure that we are providing  your outpatient care  with  the attention to detail we feel you deserve.   If we cannot assure that you're getting that kind of care,  then we cannot manage your problem effectively from this clinic.  Once you have seen [name] and we are sure that we're all on the same page with your medication use she will arrange follow up with me.   Please remember to go to the  x-ray department downstairs for your tests - we will call you with the results when they are available.

## 2015-08-26 NOTE — Progress Notes (Signed)
Quick Note:  Spoke with pt and notified of results per Dr. Wert. Pt verbalized understanding and denied any questions.  ______ 

## 2015-09-01 ENCOUNTER — Encounter: Payer: Self-pay | Admitting: Internal Medicine

## 2015-09-01 DIAGNOSIS — R058 Other specified cough: Secondary | ICD-10-CM | POA: Insufficient documentation

## 2015-09-01 DIAGNOSIS — R05 Cough: Secondary | ICD-10-CM | POA: Insufficient documentation

## 2015-09-01 NOTE — Assessment & Plan Note (Signed)
04/26/2015   Walked RA  2 laps @ 185 ft each stopped due to  Leg pain, slow pace, sats at very end down to 85%  - 08/25/2015   [facility] RA  2 laps @ 185 ft each stopped due to  Tired, no sob, sats 87% (walking much more than he usually does  As of 08/25/2015 rec 2lpm hs and prn daytime, certainly no need at rest

## 2015-09-01 NOTE — Assessment & Plan Note (Signed)
Classic Upper airway cough syndrome, so named because it's frequently impossible to sort out how much is  CR/sinusitis with freq throat clearing (which can be related to primary GERD)   vs  causing  secondary (" extra esophageal")  GERD from wide swings in gastric pressure that occur with throat clearing, often  promoting self use of mint and menthol lozenges that reduce the lower esophageal sphincter tone and exacerbate the problem further in a cyclical fashion.   These are the same pts (now being labeled as having "irritable larynx syndrome" by some cough centers) who not infrequently have a history of having failed to tolerate ace inhibitors,  dry powder inhalers or biphosphonates or report having atypical reflux symptoms that don't respond to standard doses of PPI , and are easily confused as having aecopd or asthma flares by even experienced allergists/ pulmonologists.  For now rx for gerd with acid suppression/ diet then regroup / note already supposed to be taking atrovent for pnds / reinforced

## 2015-09-01 NOTE — Assessment & Plan Note (Addendum)
Enrolled in Gold COPD program 10/27/2014 Spirometry  10/27/14  FEV1  1.86 (73%) ratio 66  - Added bud to brovana 04/26/2015    I had an extended discussion with the patient reviewing all relevant studies completed to date and  lasting 15 to 20 minutes of a 25 minute visit  Adequate control on present rx, reviewed > no change in rx needed  For now but I worry about this pt and wife's ability to handle complexity of care and maintain med reconciliation amongst all the docs he's seeing, some in epic, some not.   To keep things simple, I have asked the patient to first separate medicines that are perceived as maintenance, that is to be taken daily "no matter what", from those medicines that are taken on only on an as-needed basis and I have given the patient examples of both, and then return to see our NP to generate a  detailed  medication calendar which should be followed until the next physician sees the patient and updates it.    For today:   each maintenance medication was reviewed in detail including most importantly the difference between maintenance and prns and under what circumstances the prns are to be triggered using an action plan format that is not reflected in the computer generated alphabetically organized AVS.    Please see instructions for details which were reviewed in writing and the patient given a copy highlighting the part that I personally wrote and discussed at today's ov.

## 2015-09-05 DIAGNOSIS — R0600 Dyspnea, unspecified: Secondary | ICD-10-CM | POA: Diagnosis not present

## 2015-09-05 DIAGNOSIS — J441 Chronic obstructive pulmonary disease with (acute) exacerbation: Secondary | ICD-10-CM | POA: Diagnosis not present

## 2015-09-05 DIAGNOSIS — I739 Peripheral vascular disease, unspecified: Secondary | ICD-10-CM | POA: Diagnosis not present

## 2015-09-05 DIAGNOSIS — I10 Essential (primary) hypertension: Secondary | ICD-10-CM | POA: Diagnosis not present

## 2015-09-09 ENCOUNTER — Telehealth: Payer: Self-pay | Admitting: Neurology

## 2015-09-09 DIAGNOSIS — J209 Acute bronchitis, unspecified: Secondary | ICD-10-CM | POA: Diagnosis not present

## 2015-09-09 NOTE — Telephone Encounter (Signed)
Pt's wife called inquiring if rec'd any correspondence from [employer]. Sts she dropped off application around the 1st week in January 2017.

## 2015-09-12 ENCOUNTER — Other Ambulatory Visit: Payer: Self-pay | Admitting: *Deleted

## 2015-09-12 MED ORDER — MEMANTINE HCL ER 28 MG PO CP24: ORAL_CAPSULE | ORAL | Status: DC

## 2015-09-12 NOTE — Telephone Encounter (Signed)
Sharyn Lull I put form in Dr.Yan's office for her to sign. All she needs is signature and date.

## 2015-09-12 NOTE — Telephone Encounter (Signed)
Called and spoke to patient wife [name] [name] has another patient form being ing sent. Patient's wife was fine with this I will give to Dr. Jamie Brookes for processing there part and I will completer the rest.

## 2015-09-12 NOTE — Telephone Encounter (Signed)
Rx printed for patient assistance program.

## 2015-09-12 NOTE — Telephone Encounter (Signed)
Form is Complete on [employer]'s end RX has been attached . Spoke to patient's wife and she wants me to mail her form and RX. Relayed to patient's wife that she had to fill out all of the Highlighted area's and attach of proof of income or patient assistance .Marland Kitchen Would be denied . Patient's wife understood all details.

## 2015-09-16 DIAGNOSIS — J449 Chronic obstructive pulmonary disease, unspecified: Secondary | ICD-10-CM | POA: Diagnosis not present

## 2015-09-16 DIAGNOSIS — J9611 Chronic respiratory failure with hypoxia: Secondary | ICD-10-CM | POA: Diagnosis not present

## 2015-09-16 DIAGNOSIS — J441 Chronic obstructive pulmonary disease with (acute) exacerbation: Secondary | ICD-10-CM | POA: Diagnosis not present

## 2015-09-20 ENCOUNTER — Other Ambulatory Visit: Payer: Self-pay | Admitting: *Deleted

## 2015-09-20 NOTE — Patient Outreach (Signed)
[facility]) Care Management  09/20/2015  [name] [name] 11-17-1930 [id number]    Referral from [place] 4 list:  Telephone call to patient: left message on voice mail requesting call back.  Plan; Will follow up.  Sherrin Daisy, RN BSN [place] Management Coordinator [facility] Care Management  [phone]

## 2015-09-21 DIAGNOSIS — N2581 Secondary hyperparathyroidism of renal origin: Secondary | ICD-10-CM | POA: Diagnosis not present

## 2015-09-21 DIAGNOSIS — N183 Chronic kidney disease, stage 3 (moderate): Secondary | ICD-10-CM | POA: Diagnosis not present

## 2015-09-22 ENCOUNTER — Other Ambulatory Visit: Payer: Self-pay | Admitting: *Deleted

## 2015-09-22 NOTE — Patient Outreach (Signed)
[facility]) Care Management  09/22/2015  [name] [name] 1930-08-13 [id number]   Referral from [employer]: Telephone call to patient; spouse answered phone & states husband has beginning stages of dementia and she answers his calls. Spouse placed patient on line and he was able to verify his name, & date of birth; he also gave his spouse permission to speak with me about his health concerns.  Spouse/caregiver states patient is having problems with weakness, lost of appetite and trouble swallowing. Medical conditions include COPD (on oxygen at 2 liters at night), stage 3 kidney disease, dementia, HTN, GERD, Heart disease(triple bypass 40 yrs ago), stent in neck, neuropathy in feet, glaucoma. States he has numerous specialists and goes to [place] [facility] in [place] once a year.  Specialists include pulmonologist (Dr. Melvyn Novas), vascular specialist (Dr. Kellie Simmering),  Nephrologist(Dr. Posey Pronto), neurologist (Dr.Yan), cardiologist(Dr. Radford Pax), gastroenterologist (Dr. Oletta Lamas), eye specialist (Dr. Gershon Crane). States recently seen at primary  care office and was placed on antibiotics .  Spouse managing patient's medications and making sure he takes them. States he is on large number of medications and too many to use medication box.  States she's trying not to forget to give him all that he needs. States using mail order pharmacy and local pharmacy for prescription medications.  States major concerns for patient are poor appetite, trouble swallowing solid foods, lost 4 lbs in last month, having nausea.  Current weight about 170 lbs/ 5\' 7"  height . States patient was on amoxicillin but started having trouble sleeping so she stopped giving for several days. States she plans to call MD and advise.  States patient has had chest xray and she was told it was okay.   Spouse states she plans to take care of patient as long as she is able but currently has no long range plans for his care.  Spouse states  she needs more organization when managing patient medications and wishes to learn more about caring for patient's chronic conditions as they progress.   Spouse/caregiver was advised of [facility] care management services. Spouse/caregiver consents to [facility] services.   Plan:  Will send to care management assistant to refer to community care coordinator for complex case management/82 year old patient with poor appetite, trouble swallowing, weight lost/Dxs- dementia, COPD (using oxygen at night), kidney disease 3, neuropathy, HTN, PVD, GERD, glaucoma. Please send to pharmacy- patient  taking over 20 medications.  Sherrin Daisy, RN BSN [place] Management Coordinator [facility] Care Management  [phone]

## 2015-09-27 ENCOUNTER — Other Ambulatory Visit: Payer: Self-pay | Admitting: *Deleted

## 2015-09-27 ENCOUNTER — Encounter: Payer: Self-pay | Admitting: *Deleted

## 2015-09-27 NOTE — Patient Outreach (Signed)
Call to patient home, spoke with patient and wife, Peter Congo. Wife agrees to home visit for [facility] program assessment. Some issues she states they have are COPD, memory issues, HCPOA need and Polypharmacy Scheduled appointment for 09/30/15 Royetta Crochet. Laymond Purser, RN, BSN, [place] [phone]

## 2015-09-29 DIAGNOSIS — N2581 Secondary hyperparathyroidism of renal origin: Secondary | ICD-10-CM | POA: Diagnosis not present

## 2015-09-29 DIAGNOSIS — N183 Chronic kidney disease, stage 3 (moderate): Secondary | ICD-10-CM | POA: Diagnosis not present

## 2015-09-29 DIAGNOSIS — I129 Hypertensive chronic kidney disease with stage 1 through stage 4 chronic kidney disease, or unspecified chronic kidney disease: Secondary | ICD-10-CM | POA: Diagnosis not present

## 2015-09-30 ENCOUNTER — Encounter: Payer: Self-pay | Admitting: *Deleted

## 2015-09-30 ENCOUNTER — Other Ambulatory Visit: Payer: Self-pay

## 2015-09-30 ENCOUNTER — Other Ambulatory Visit: Payer: Self-pay | Admitting: *Deleted

## 2015-09-30 NOTE — Patient Outreach (Signed)
[facility]) Care Management   09/30/2015  [name] [name] 1930/11/30 [id number]  [name] [name] is an 82 y.o. male   Co-visit with Dawn Pettus, Pharm-D with [facility].  Subjective:   Patient denies pain but wife reports patient complains of mouth soreness, she has taken patient to dentist regarding dentures. Caregiver voices concerns over medications as patient takes a lot of medications and she is worried about side effects and mixing medications. Also, caregiver voices concerns about patient overall health.  Wife voices appreciation of [facility] services delivered this visit.  Patient has many specialists including kidney, heart, foot,eye, lung and foot MD.   Upcoming appointments: See Urologist later in month Needs to make appointment with podiatry and eye MD  Objective:   Patient sitting at table finishing a meal, neatly groomed and dressed BP 122/60 mmHg  Pulse 65  Resp 20  Ht 1.702 m (5\' 7" )  Wt 171 lb (77.565 kg)  BMI 26.78 kg/m2  SpO2 95% Review of Systems  Constitutional: Positive for weight loss.  HENT: Positive for congestion.   Eyes: Negative.   Cardiovascular: Negative.   Gastrointestinal: Positive for heartburn and nausea.  Genitourinary: Negative.   Musculoskeletal: Negative.   Neurological: Negative.   Endo/Heme/Allergies: Negative.   Psychiatric/Behavioral: Positive for memory loss.    Physical Exam  Constitutional: He appears well-developed and well-nourished.  Cardiovascular: Normal rate.   Respiratory: Effort normal and breath sounds normal.  GI: Soft. Bowel sounds are normal.  Musculoskeletal: Normal range of motion.  Neurological: He is alert.  Oriented x person and place  Skin: Skin is warm and dry.  Psychiatric: He has a normal mood and affect.    Current Medications:   Current Outpatient Prescriptions  Medication Sig Dispense Refill  . acetaminophen (TYLENOL) 500 MG tablet Take 1,000 mg by mouth every 6 (six) hours as needed  for headache.    . albuterol (PROVENTIL HFA;VENTOLIN HFA) 108 (90 BASE) MCG/ACT inhaler Inhale 2 puffs into the lungs every 4 (four) hours as needed for wheezing or shortness of breath. (Patient not taking: Reported on 09/30/2015) 1 Inhaler 0  . arformoterol (BROVANA) 15 MCG/2ML NEBU Take 2 mLs (15 mcg total) by nebulization 2 (two) times daily. 120 mL 6  . aspirin 325 MG tablet Take 325 mg by mouth daily.      Marland Kitchen atorvastatin (LIPITOR) 20 MG tablet Take 1 tablet (20 mg total) by mouth daily. 90 tablet 3  . budesonide (PULMICORT) 0.25 MG/2ML nebulizer solution Use one vial twice daily with brovana 120 mL 11  . calcitRIOL (ROCALTROL) 0.5 MCG capsule Take 0.5 mcg by mouth daily.    . Cholecalciferol (VITAMIN D3) 5000 UNITS TABS Take 5,000 Units by mouth every other day. Reported on 09/30/2015    . docusate sodium (COLACE) 100 MG capsule Take 300 mg by mouth daily as needed for mild constipation. Reported on 09/30/2015    . donepezil (ARICEPT) 10 MG tablet Take 1 tablet (10 mg total) by mouth at bedtime. 90 tablet 1  . feeding supplement, ENSURE ENLIVE, (ENSURE ENLIVE) LIQD Take 237 mLs by mouth 2 (two) times daily between meals. 30 Bottle 6  . ferrous sulfate 325 (65 FE) MG tablet Take 325 mg by mouth 2 (two) times daily with a meal.    . finasteride (PROSCAR) 5 MG tablet Take 5 mg by mouth daily.     . fluticasone (FLONASE) 50 MCG/ACT nasal spray Place 2 sprays into both nostrils daily as needed for allergies.     Marland Kitchen  gabapentin (NEURONTIN) 300 MG capsule Take 1 capsule (300 mg total) by mouth 3 (three) times daily. 270 capsule 3  . guaiFENesin-dextromethorphan (ROBITUSSIN DM) 100-10 MG/5ML syrup Take 5 mLs by mouth every 4 (four) hours as needed for cough. 118 mL 0  . ipratropium (ATROVENT) 0.06 % nasal spray Place 1 spray into both nostrils 4 (four) times daily.    . lansoprazole (PREVACID) 30 MG capsule Take 1 capsule by mouth daily.    Marland Kitchen latanoprost (XALATAN) 0.005 % ophthalmic solution Place 1 drop  into both eyes at bedtime.     . memantine (NAMENDA XR) 28 MG CP24 24 hr capsule TAKE ONE CAPSULE BY MOUTH ONCE DAILY. 90 capsule 3  . metoCLOPramide (REGLAN) 10 MG tablet Take 10 mg by mouth daily before breakfast.     . metoprolol succinate (TOPROL-XL) 25 MG 24 hr tablet Take 1 tablet (25 mg total) by mouth daily. 90 tablet 2  . mirtazapine (REMERON) 30 MG tablet Take 30 mg by mouth at bedtime.    . Multiple Vitamin (MULTIVITAMIN WITH MINERALS) TABS tablet Take 1 tablet by mouth daily.    . nitroGLYCERIN (NITROSTAT) 0.4 MG SL tablet Place 1 tablet (0.4 mg total) under the tongue every 5 (five) minutes as needed for chest pain. 90 tablet 3  . Phenyleph-CPM-DM-APAP (ALKA-SELTZER PLUS COLD & FLU) 11-21-08-250 MG TBEF Take 1 tablet by mouth 2 (two) times daily as needed.    . polyethylene glycol (MIRALAX / GLYCOLAX) packet Take 17 g by mouth daily as needed for moderate constipation.     . Tamsulosin HCl (FLOMAX) 0.4 MG CAPS Take 0.8 mg by mouth at bedtime.      No current facility-administered medications for this visit.    Functional Status:   In your present state of health, do you have any difficulty performing the following activities: 09/22/2015 10/15/2014  Hearing? N N  Vision? Y N  Difficulty concentrating or making decisions? Tempie Donning  Walking or climbing stairs? Y N  Dressing or bathing? Y N  Doing errands, shopping? Y N    Fall/Depression Screening:   Fall Risk  09/27/2015 12/30/2013  Falls in the past year? No No  Risk for fall due to : Impaired balance/gait Other (Comment)  Risk for fall due to (comments): - neuropathy    PHQ 2/9 Scores 09/27/2015  Exception Documentation Medical reason    Assessment:   COPD-patient wears oxygen at night, Emmi education given Dementia-Oriented to person and place, wife is caregiver. EMMI education given Poly pharmacy-Co-visit with [facility] pharmacist who set up med box Plan:  [facility] CM Care Plan Problem One        Most Recent Value   Care Plan Problem  One  COPD knowledge deficit   Role Documenting the Problem One  Care Management Coordinator   Care Plan for Problem One  Active   [facility] Long Term Goal (31-90 days)  Caregiver will be able to verbalize action plan for COPD exacerbation in the next 60 days   [facility] Long Term Goal Start Date  09/27/15   Interventions for Problem One Long Term Goal  Using [place], gave EMMI regarding COPD medications, also gave [facility] calendar wtih COPD zones   [facility] CM Short Term Goal #1 (0-30 days)  Caregiver will be able to verbalize understanding of medications for COPD over the next 21 days   [facility] CM Short Term Goal #1 Start Date  09/27/15   Interventions for Short Term Goal #1  Co-visit with [facility]  pharmacist to organizes, review and educate on patient medications with caregiver    Plan to visit again in April Wife verbalizes to call RNCM with any questions or concerns   Royetta Crochet. Laymond Purser, RN, BSN, [place] [phone]

## 2015-10-01 NOTE — Patient Outreach (Signed)
[facility]) Care Management  [place]   09/30/15  [name] [name] 05/27/1931 [id number]  Subjective: [name] [name] is a 82 year old male who was referred to pharmacy for polypharmacy and to review his medications with his wife.  I am completing this home visit with Merrily Brittle, RN, his nurse care manager.  His wife is his main caregiver due to his dementia.  She is currently managing his medications.  She is trying to space his medications throughout the day.  She gives some medications before breakfast, after breakfast, at snack, before dinner, and at bedtime.  She feels like she spends a lot of her day just giving medications to him.  She would like to simplify his regimen as much as possible.  She is concerned that he is very sleepy and is very hard to wake up.    Objective:   Current Medications: Current Outpatient Prescriptions  Medication Sig Dispense Refill  . acetaminophen (TYLENOL) 500 MG tablet Take 1,000 mg by mouth every 6 (six) hours as needed for headache.    Marland Kitchen arformoterol (BROVANA) 15 MCG/2ML NEBU Take 2 mLs (15 mcg total) by nebulization 2 (two) times daily. 120 mL 6  . aspirin 325 MG tablet Take 325 mg by mouth daily.      Marland Kitchen atorvastatin (LIPITOR) 20 MG tablet Take 1 tablet (20 mg total) by mouth daily. 90 tablet 3  . budesonide (PULMICORT) 0.25 MG/2ML nebulizer solution Use one vial twice daily with brovana 120 mL 11  . calcitRIOL (ROCALTROL) 0.5 MCG capsule Take 0.5 mcg by mouth daily.    . Cholecalciferol (VITAMIN D3) 5000 UNITS TABS Take 5,000 Units by mouth every other day. Reported on 09/30/2015    . docusate sodium (COLACE) 100 MG capsule Take 300 mg by mouth daily as needed for mild constipation. Reported on 09/30/2015    . donepezil (ARICEPT) 10 MG tablet Take 1 tablet (10 mg total) by mouth at bedtime. 90 tablet 1  . feeding supplement, ENSURE ENLIVE, (ENSURE ENLIVE) LIQD Take 237 mLs by mouth 2 (two) times daily between meals. 30 Bottle 6  .  ferrous sulfate 325 (65 FE) MG tablet Take 325 mg by mouth 2 (two) times daily with a meal.    . finasteride (PROSCAR) 5 MG tablet Take 5 mg by mouth daily.     . fluticasone (FLONASE) 50 MCG/ACT nasal spray Place 2 sprays into both nostrils daily as needed for allergies.     Marland Kitchen gabapentin (NEURONTIN) 300 MG capsule Take 1 capsule (300 mg total) by mouth 3 (three) times daily. 270 capsule 3  . guaiFENesin-dextromethorphan (ROBITUSSIN DM) 100-10 MG/5ML syrup Take 5 mLs by mouth every 4 (four) hours as needed for cough. 118 mL 0  . ipratropium (ATROVENT) 0.06 % nasal spray Place 1 spray into both nostrils 4 (four) times daily.    . lansoprazole (PREVACID) 30 MG capsule Take 1 capsule by mouth daily.    Marland Kitchen latanoprost (XALATAN) 0.005 % ophthalmic solution Place 1 drop into both eyes at bedtime.     Marland Kitchen lubiprostone (AMITIZA) 24 MCG capsule Take 24 mcg by mouth daily with breakfast.    . memantine (NAMENDA XR) 28 MG CP24 24 hr capsule TAKE ONE CAPSULE BY MOUTH ONCE DAILY. 90 capsule 3  . metoprolol succinate (TOPROL-XL) 25 MG 24 hr tablet Take 1 tablet (25 mg total) by mouth daily. 90 tablet 2  . mirtazapine (REMERON) 30 MG tablet Take 30 mg by mouth at bedtime.    Marland Kitchen  Multiple Vitamin (MULTIVITAMIN WITH MINERALS) TABS tablet Take 1 tablet by mouth daily.    . nitroGLYCERIN (NITROSTAT) 0.4 MG SL tablet Place 1 tablet (0.4 mg total) under the tongue every 5 (five) minutes as needed for chest pain. 90 tablet 3  . Phenyleph-CPM-DM-APAP (ALKA-SELTZER PLUS COLD & FLU) 11-21-08-250 MG TBEF Take 1 tablet by mouth 2 (two) times daily as needed.    . polyethylene glycol (MIRALAX / GLYCOLAX) packet Take 17 g by mouth daily as needed for moderate constipation.     . promethazine (PHENERGAN) 12.5 MG tablet Take 12.5 mg by mouth 2 (two) times daily as needed for nausea.    . Tamsulosin HCl (FLOMAX) 0.4 MG CAPS Take 0.8 mg by mouth at bedtime.     . vitamin B-12 (CYANOCOBALAMIN) 1000 MCG tablet Take 1,000 mcg by mouth  daily.    Marland Kitchen albuterol (PROVENTIL HFA;VENTOLIN HFA) 108 (90 BASE) MCG/ACT inhaler Inhale 2 puffs into the lungs every 4 (four) hours as needed for wheezing or shortness of breath. (Patient not taking: Reported on 09/30/2015) 1 Inhaler 0  . benzonatate (TESSALON) 100 MG capsule Take 100-200 mg by mouth 3 (three) times daily as needed for cough. Reported on 09/30/2015    . metoCLOPramide (REGLAN) 10 MG tablet Take 10 mg by mouth daily before breakfast. Reported on 09/30/2015     No current facility-administered medications for this visit.    Functional Status: In your present state of health, do you have any difficulty performing the following activities: 09/30/2015 09/22/2015  Hearing? - N  Vision? - Y  Difficulty concentrating or making decisions? - Y  Walking or climbing stairs? - Y  Dressing or bathing? - Y  Doing errands, shopping? - Y  Preparing Food and eating ? Y -  Using the Toilet? N -  In the past six months, have you accidently leaked urine? Y -  Do you have problems with loss of bowel control? Y -  Managing your Medications? Y -  Managing your Finances? Y -  Housekeeping or managing your Housekeeping? Y -    Fall/Depression Screening: PHQ 2/9 Scores 09/30/2015 09/27/2015  PHQ - 2 Score 0 -  Exception Documentation - Medical reason    Assessment:  Drugs sorted by system:  Neurologic/Psychologic: donepezil, gabapentin, memantine, mirtazapine  Cardiovascular: aspirin, atorvastatin, metoprolol, nitrostat  Pulmonary/Allergy: Brovana, Pulmicort, fluticasone, guaifenesin DM, ipratropium, benzonatate  Gastrointestinal: lansoprazole, lubiprostone, miralax, promethazine, metoclopramide  Endocrine: none  Renal: calcitriol  Topical: none  Pain: acetaminophen  Vitamins/Minerals: vitamin D, ferrous sulfate, multivitamin, vitamin B12  Infectious Diseases: none  Miscellaneous: colace, finasteride, latanoprost, tamsulosin   Duplications in therapy: none Gaps in therapy:  none Medications to avoid in the elderly: mirtazapine (increase sedation) Drug interactions: none Other issues noted:  none   Plan: 1.  I reviewed [name] [name] medications with Mrs. Ronnald Ramp.  I showed Mrs. Skowron how she could fill the pill box to lessen the burden on how often she had to give him pills through out the day.  I was able to fill the pill box to allow her to give him his medications three times a day.   2.  I recommended to Mrs. Holt to discuss mirtazapine dose with [name] [name] provider to see if the dose could be decreased.  He is sleeping a lot and mirtazapine could be contributing to his sedation.  3.  I am going to return in 3 weeks to monitor his adherence.    [facility] CM Care Plan Problem One  Most Recent Value   Care Plan Problem One  medication adherence   Role Documenting the Problem One  Clinical Pharmacist   Care Plan for Problem One  Active   [facility] CM Short Term Goal #1 (0-30 days)  Caregiver will use the pillbox over the next 30 days to help with adherence for [name] [name] evident by caregiver report   [facility] CM Short Term Goal #1 Start Date  09/30/15   Interventions for Short Term Goal #1  Co-visit completed with nurse.  I was able to fill pill box for patient with caregiver watching. I explained she could give medications together to allow less burden on giving medications throughout the day.  Medication box set up to give medication three times a day.       Deanne Coffer, PharmD, [place] [phone]

## 2015-10-03 DIAGNOSIS — R0600 Dyspnea, unspecified: Secondary | ICD-10-CM | POA: Diagnosis not present

## 2015-10-03 DIAGNOSIS — I739 Peripheral vascular disease, unspecified: Secondary | ICD-10-CM | POA: Diagnosis not present

## 2015-10-03 DIAGNOSIS — I10 Essential (primary) hypertension: Secondary | ICD-10-CM | POA: Diagnosis not present

## 2015-10-03 DIAGNOSIS — J441 Chronic obstructive pulmonary disease with (acute) exacerbation: Secondary | ICD-10-CM | POA: Diagnosis not present

## 2015-10-04 ENCOUNTER — Telehealth: Payer: Self-pay | Admitting: Neurology

## 2015-10-04 DIAGNOSIS — R110 Nausea: Secondary | ICD-10-CM | POA: Diagnosis not present

## 2015-10-04 MED ORDER — MEMANTINE HCL ER 28 MG PO CP24: ORAL_CAPSULE | ORAL | Status: DC

## 2015-10-04 NOTE — Telephone Encounter (Signed)
Hi Michelle Patient assistance form is Financial controller and Dr.Yan's signature. Return to me please and I will finish processing. I will put form in your in box . Thanks Hinton Dyer.

## 2015-10-10 NOTE — Telephone Encounter (Signed)
All patient assistant forms have been faxed to [place] telephone number [phone] fax number [phone]. Namenda XR.

## 2015-10-12 DIAGNOSIS — K219 Gastro-esophageal reflux disease without esophagitis: Secondary | ICD-10-CM | POA: Diagnosis not present

## 2015-10-12 DIAGNOSIS — K3184 Gastroparesis: Secondary | ICD-10-CM | POA: Diagnosis not present

## 2015-10-12 DIAGNOSIS — K5900 Constipation, unspecified: Secondary | ICD-10-CM | POA: Diagnosis not present

## 2015-10-14 DIAGNOSIS — J9611 Chronic respiratory failure with hypoxia: Secondary | ICD-10-CM | POA: Diagnosis not present

## 2015-10-14 DIAGNOSIS — J441 Chronic obstructive pulmonary disease with (acute) exacerbation: Secondary | ICD-10-CM | POA: Diagnosis not present

## 2015-10-14 DIAGNOSIS — J449 Chronic obstructive pulmonary disease, unspecified: Secondary | ICD-10-CM | POA: Diagnosis not present

## 2015-10-18 ENCOUNTER — Other Ambulatory Visit: Payer: Self-pay

## 2015-10-18 NOTE — Patient Outreach (Signed)
[facility]) Care Management  [place]   10/18/2015  ANDREWJAMES WEIRAUCH [birth year] [id number]  Subjective: Mr. Kessner is 82 year old male who was referred to pharmacy for polypharmacy and medication management.  I was able to work with his wife at my last visit to simplify his regimen to only take his medications three times a day.  Mrs. Liberto reports Mr. Privott is 100% adherent to his medications.  She stated she is not having any difficulty filling up his pill box on a weekly basis.  She just wants me to confirm that she has all the medications in the correct spot. Mrs. Staup is concerned about an interaction between promethazine and metoclopramide.  She is not giving promethazine due to this interaction even though he is complaining of nausea.    Objective:   Current Medications: Current Outpatient Prescriptions  Medication Sig Dispense Refill  . acetaminophen (TYLENOL) 500 MG tablet Take 1,000 mg by mouth every 6 (six) hours as needed for headache.    Marland Kitchen arformoterol (BROVANA) 15 MCG/2ML NEBU Take 2 mLs (15 mcg total) by nebulization 2 (two) times daily. 120 mL 6  . aspirin 325 MG tablet Take 325 mg by mouth daily.      Marland Kitchen atorvastatin (LIPITOR) 20 MG tablet Take 1 tablet (20 mg total) by mouth daily. 90 tablet 3  . budesonide (PULMICORT) 0.25 MG/2ML nebulizer solution Use one vial twice daily with brovana 120 mL 11  . calcitRIOL (ROCALTROL) 0.5 MCG capsule Take 0.5 mcg by mouth daily.    . cetirizine (ZYRTEC) 10 MG tablet Take 10 mg by mouth daily as needed for allergies.    . Cholecalciferol (VITAMIN D3) 5000 UNITS TABS Take 5,000 Units by mouth every other day. Reported on 09/30/2015    . docusate sodium (COLACE) 100 MG capsule Take 300 mg by mouth daily as needed for mild constipation. Reported on 09/30/2015    . donepezil (ARICEPT) 10 MG tablet Take 1 tablet (10 mg total) by mouth at bedtime. 90 tablet 1  . feeding supplement, ENSURE ENLIVE, (ENSURE ENLIVE) LIQD Take  237 mLs by mouth 2 (two) times daily between meals. 30 Bottle 6  . ferrous sulfate 325 (65 FE) MG tablet Take 325 mg by mouth 2 (two) times daily with a meal.    . finasteride (PROSCAR) 5 MG tablet Take 5 mg by mouth daily.     . fluticasone (FLONASE) 50 MCG/ACT nasal spray Place 2 sprays into both nostrils daily as needed for allergies.     Marland Kitchen gabapentin (NEURONTIN) 300 MG capsule Take 1 capsule (300 mg total) by mouth 3 (three) times daily. 270 capsule 3  . guaiFENesin-dextromethorphan (ROBITUSSIN DM) 100-10 MG/5ML syrup Take 5 mLs by mouth every 4 (four) hours as needed for cough. 118 mL 0  . ipratropium (ATROVENT) 0.06 % nasal spray Place 1 spray into both nostrils 4 (four) times daily.    . lansoprazole (PREVACID) 30 MG capsule Take 1 capsule by mouth daily.    Marland Kitchen latanoprost (XALATAN) 0.005 % ophthalmic solution Place 1 drop into both eyes at bedtime.     Marland Kitchen lubiprostone (AMITIZA) 24 MCG capsule Take 24 mcg by mouth daily with breakfast.    . memantine (NAMENDA XR) 28 MG CP24 24 hr capsule TAKE ONE CAPSULE BY MOUTH ONCE DAILY. 90 capsule 3  . metoCLOPramide (REGLAN) 10 MG tablet Take 10 mg by mouth daily before breakfast. Reported on 09/30/2015    . metoprolol succinate (TOPROL-XL) 25  MG 24 hr tablet Take 1 tablet (25 mg total) by mouth daily. 90 tablet 2  . mirtazapine (REMERON) 30 MG tablet Take 30 mg by mouth at bedtime.    . Multiple Vitamin (MULTIVITAMIN WITH MINERALS) TABS tablet Take 1 tablet by mouth daily.    . nitroGLYCERIN (NITROSTAT) 0.4 MG SL tablet Place 1 tablet (0.4 mg total) under the tongue every 5 (five) minutes as needed for chest pain. 90 tablet 3  . polyethylene glycol (MIRALAX / GLYCOLAX) packet Take 17 g by mouth daily as needed for moderate constipation.     . Tamsulosin HCl (FLOMAX) 0.4 MG CAPS Take 0.8 mg by mouth at bedtime.     . vitamin B-12 (CYANOCOBALAMIN) 1000 MCG tablet Take 1,000 mcg by mouth daily.    Marland Kitchen albuterol (PROVENTIL HFA;VENTOLIN HFA) 108 (90 BASE)  MCG/ACT inhaler Inhale 2 puffs into the lungs every 4 (four) hours as needed for wheezing or shortness of breath. (Patient not taking: Reported on 09/30/2015) 1 Inhaler 0  . benzonatate (TESSALON) 100 MG capsule Take 100-200 mg by mouth 3 (three) times daily as needed for cough. Reported on 10/18/2015    . promethazine (PHENERGAN) 12.5 MG tablet Take 12.5 mg by mouth 2 (two) times daily as needed for nausea. Reported on 10/18/2015     No current facility-administered medications for this visit.    Functional Status: In your present state of health, do you have any difficulty performing the following activities: 09/30/2015 09/22/2015  Hearing? - N  Vision? - Y  Difficulty concentrating or making decisions? - Y  Walking or climbing stairs? - Y  Dressing or bathing? - Y  Doing errands, shopping? - Y  Preparing Food and eating ? Y -  Using the Toilet? N -  In the past six months, have you accidently leaked urine? Y -  Do you have problems with loss of bowel control? Y -  Managing your Medications? Y -  Managing your Finances? Y -  Housekeeping or managing your Housekeeping? Y -    Fall/Depression Screening: PHQ 2/9 Scores 09/30/2015 09/27/2015  PHQ - 2 Score 0 -  Exception Documentation - Medical reason    Assessment: 1. Medication adherence:  He is adherent to all of his medications.  2.  Promethazine and metoclopramide drug interaction:  There is an moderate interaction between promethazine and metoclopramide which may place the patient at risk for developing tardive dyskinesia.  Currently Mrs. Lankford is not giving promethazine due to the interaction.    Plan: 1.  I reviewed the pill box with Mrs. Ronnald Ramp.  She filled it correctly.   2.  I will send a letter to Dr. Laurence Spates in regards to using ondansetron instead of promethazine for nausea.  There is no interaction between ondansetron and metoclopramide.   3.  I will notify Merrily Brittle RN, nurse care manager of my findings.  4.  I am going  to close his case to pharmacy since all of his pharmacy needs have been met.  I am happy to follow up in the future if needed.    [facility] CM Care Plan Problem One        Most Recent Value   Care Plan Problem One  medication adherence   Role Documenting the Problem One  Clinical Pharmacist   Care Plan for Problem One  Active   [facility] CM Short Term Goal #1 (0-30 days)  Caregiver will use the pillbox over the next 30 days to help with  adherence for Mr. Ebron evident by caregiver report   [facility] CM Short Term Goal #1 Start Date  09/30/15   [facility] CM Short Term Goal #1 Met Date  10/18/15   Interventions for Short Term Goal #1  Reviewed pill box with Mrs. Retz to make sure all medications were correct.  The medications were in the correct spot 100%  of the time.  Mrs. Rother reports Mr. Schwertner is adherent to the medication 100% of the time.      Deanne Coffer, PharmD, [place] [phone]

## 2015-10-27 ENCOUNTER — Encounter: Payer: Self-pay | Admitting: Podiatry

## 2015-10-27 ENCOUNTER — Ambulatory Visit (INDEPENDENT_AMBULATORY_CARE_PROVIDER_SITE_OTHER): Payer: Commercial Managed Care - HMO | Admitting: Podiatry

## 2015-10-27 DIAGNOSIS — M79676 Pain in unspecified toe(s): Secondary | ICD-10-CM | POA: Diagnosis not present

## 2015-10-27 DIAGNOSIS — L603 Nail dystrophy: Secondary | ICD-10-CM | POA: Diagnosis not present

## 2015-10-27 DIAGNOSIS — B351 Tinea unguium: Secondary | ICD-10-CM | POA: Diagnosis not present

## 2015-10-27 NOTE — Progress Notes (Signed)
He presents today with a chief complaint of painful elongated toenails.  Objective: Vital signs are stable alert and oriented 3. Pulses are slightly diminished with +1 over 4 DP and PT that capillary fill time is immediate and feet are warm to the touch. His toenails are thick yellow dystrophic, mycotic and painful on palpation.  Assessment: Pain in limb secondary to onychomycosis with mild peripheral vascular disease.  Plan: Debridement of toenails 1 through 5 bilateral.

## 2015-10-28 ENCOUNTER — Encounter: Payer: Self-pay | Admitting: *Deleted

## 2015-10-28 ENCOUNTER — Other Ambulatory Visit: Payer: Self-pay | Admitting: *Deleted

## 2015-10-28 NOTE — Patient Outreach (Signed)
Call from [place], Pharm D with [facility].  She states that patient should not have interaction with Namenda and Reglan. Concern is for interaction with phenergan and reglan, will reinstruct patient wife on medications and possible interactions.  Royetta Crochet. Laymond Purser, RN, BSN, [place] [phone]

## 2015-10-28 NOTE — Patient Outreach (Signed)
Call to patient home regarding medication question and instructions from [place]. Left message with RNCM contact requesting call back [facility] E. Laymond Purser, RN, BSN, [place] [phone]

## 2015-10-28 NOTE — Patient Outreach (Signed)
[place] [facility]) Care Management   10/28/2015  [name] [name] 04-Mar-1931 [id number]  [name] [name] is an 82 y.o. male  Subjective:  Patient reports he is doing pretty well, denies any pain. Patient and wife report swallowing is much better. Patient denies any shortness of breath or coughing. Patient does have nausea and metallic taste in mouth, wife worries about kidney issues. Patient wife has a Quarry manager from [facility] letter about payment for prevacid.  Patient saw kidney MD, no new medications, they did get labs, but no results. Patient will see Urologist this month. Patient saw foot MD this week.   Objective:   Patient neat and clean, home clean and uncluttered.  BP 122/64 mmHg  Pulse 66  Resp 20  Wt 175 lb (79.379 kg)  SpO2 92% Review of Systems  Constitutional: Negative.   HENT: Negative.   Respiratory: Negative.   Gastrointestinal: Positive for nausea.  Genitourinary: Negative.   Musculoskeletal: Negative.   Skin: Negative.   Endo/Heme/Allergies: Negative.   Psychiatric/Behavioral: Positive for memory loss.    Physical Exam  Constitutional: He appears well-developed and well-nourished.  Neck: Normal range of motion.  Cardiovascular: Normal rate.   GI: Soft. He exhibits distension.  Musculoskeletal: Normal range of motion.  Skin: Skin is warm and dry.    Encounter Medications:   Outpatient Encounter Prescriptions as of 10/28/2015  Medication Sig Note  . acetaminophen (TYLENOL) 500 MG tablet Take 1,000 mg by mouth every 6 (six) hours as needed for headache.   . albuterol (PROVENTIL) (2.5 MG/3ML) 0.083% nebulizer solution Take 2.5 mg by nebulization every 6 (six) hours as needed for wheezing or shortness of breath.   Marland Kitchen arformoterol (BROVANA) 15 MCG/2ML NEBU Take 2 mLs (15 mcg total) by nebulization 2 (two) times daily.   Marland Kitchen aspirin 325 MG tablet Take 325 mg by mouth daily.     Marland Kitchen atorvastatin (LIPITOR) 20 MG tablet Take 1 tablet (20 mg total) by mouth  daily.   . benzonatate (TESSALON) 100 MG capsule Take 100-200 mg by mouth 3 (three) times daily as needed for cough. Reported on 10/18/2015   . budesonide (PULMICORT) 0.25 MG/2ML nebulizer solution Use one vial twice daily with brovana   . calcitRIOL (ROCALTROL) 0.5 MCG capsule Take 0.5 mcg by mouth daily.   . cetirizine (ZYRTEC) 10 MG tablet Take 10 mg by mouth daily as needed for allergies.   . Cholecalciferol (VITAMIN D3) 5000 UNITS TABS Take 5,000 Units by mouth every other day. Reported on 09/30/2015   . docusate sodium (COLACE) 100 MG capsule Take 300 mg by mouth daily as needed for mild constipation. Reported on 09/30/2015   . donepezil (ARICEPT) 10 MG tablet Take 1 tablet (10 mg total) by mouth at bedtime.   . feeding supplement, ENSURE ENLIVE, (ENSURE ENLIVE) LIQD Take 237 mLs by mouth 2 (two) times daily between meals.   . ferrous sulfate 325 (65 FE) MG tablet Take 325 mg by mouth 2 (two) times daily with a meal.   . finasteride (PROSCAR) 5 MG tablet Take 5 mg by mouth daily.    . fluticasone (FLONASE) 50 MCG/ACT nasal spray Place 2 sprays into both nostrils daily as needed for allergies.    Marland Kitchen gabapentin (NEURONTIN) 300 MG capsule Take 1 capsule (300 mg total) by mouth 3 (three) times daily.   Marland Kitchen guaiFENesin-dextromethorphan (ROBITUSSIN DM) 100-10 MG/5ML syrup Take 5 mLs by mouth every 4 (four) hours as needed for cough.   Marland Kitchen ipratropium (ATROVENT) 0.06 %  nasal spray Place 1 spray into both nostrils 4 (four) times daily.   . lansoprazole (PREVACID) 30 MG capsule Take 1 capsule by mouth daily. 10/28/2015: RX written for twice a day, but taking daily  . latanoprost (XALATAN) 0.005 % ophthalmic solution Place 1 drop into both eyes at bedtime.    Marland Kitchen lubiprostone (AMITIZA) 24 MCG capsule Take 24 mcg by mouth daily with breakfast.   . memantine (NAMENDA XR) 28 MG CP24 24 hr capsule TAKE ONE CAPSULE BY MOUTH ONCE DAILY.   Marland Kitchen metoCLOPramide (REGLAN) 10 MG tablet Take 10 mg by mouth daily before  breakfast. Reported on 09/30/2015 10/28/2015: Taking twice a day. Call to MD regarding taking three times a day  . metoprolol succinate (TOPROL-XL) 25 MG 24 hr tablet Take 1 tablet (25 mg total) by mouth daily.   . mirtazapine (REMERON) 30 MG tablet Take 30 mg by mouth at bedtime.   . Multiple Vitamin (MULTIVITAMIN WITH MINERALS) TABS tablet Take 1 tablet by mouth daily.   . nitroGLYCERIN (NITROSTAT) 0.4 MG SL tablet Place 1 tablet (0.4 mg total) under the tongue every 5 (five) minutes as needed for chest pain.   . polyethylene glycol (MIRALAX / GLYCOLAX) packet Take 17 g by mouth daily as needed for moderate constipation.    . Tamsulosin HCl (FLOMAX) 0.4 MG CAPS Take 0.8 mg by mouth at bedtime.    . vitamin B-12 (CYANOCOBALAMIN) 1000 MCG tablet Take 1,000 mcg by mouth daily.   . promethazine (PHENERGAN) 12.5 MG tablet Take 12.5 mg by mouth 2 (two) times daily as needed for nausea. Reported on 10/28/2015 10/18/2015: Not taking due to interaction with metoclopramide   No facility-administered encounter medications on file as of 10/28/2015.    Functional Status:   In your present state of health, do you have any difficulty performing the following activities: 09/30/2015 09/22/2015  Hearing? - N  Vision? - Y  Difficulty concentrating or making decisions? - Y  Walking or climbing stairs? - Y  Dressing or bathing? - Y  Doing errands, shopping? - Y  Preparing Food and eating ? Y -  Using the Toilet? N -  In the past six months, have you accidently leaked urine? Y -  Do you have problems with loss of bowel control? Y -  Managing your Medications? Y -  Managing your Finances? Y -  Housekeeping or managing your Housekeeping? Y -    Fall/Depression Screening:    Fall Risk  09/27/2015 12/30/2013  Falls in the past year? No No  Risk for fall due to : Impaired balance/gait Other (Comment)  Risk for fall due to (comments): - neuropathy   PHQ 2/9 Scores 09/30/2015 09/27/2015  PHQ - 2 Score 0 -  Exception  Documentation - Medical reason    Assessment:   COPD-no symptoms this visit, gave COPD packet with COPD zones magnet  Medication question-Call to MD office regarding Prevacid and also about nausea. Spoke with Proofreader. Instructed patient to take prevacid daily and they will call back to let patient and wife know if they can increase reglan to 3 times a day.  Call to [facility] Pharmacist regarding wife concern about interaction of Namenda and Reglan  Plan:  [facility] CM Care Plan Problem One        Most Recent Value   Care Plan Problem One  COPD knowledge deficit   Role Documenting the Problem One  Care Management Coordinator   Care Plan for Problem One  Active  [facility] Long Term Goal (31-90 days)  Caregiver will be able to verbalize action plan for COPD exacerbation in the next 60 days   [facility] Long Term Goal Start Date  09/27/15   Interventions for Problem One Long Term Goal  Using [place], gave COPD folder and COPD zones magnet.    [facility] CM Short Term Goal #1 (0-30 days)  Caregiver will be able to verbalize understanding of medications for COPD over the next 21 days      Will call patient wife after hearing back from [facility] pharmacist Wife verbalizes that she understands who to call for questions or concerns. Will visit again in May and assess for possible discharge.  Royetta Crochet. Laymond Purser, RN, BSN, [place] [phone]

## 2015-10-31 ENCOUNTER — Ambulatory Visit: Payer: Commercial Managed Care - HMO | Admitting: Nurse Practitioner

## 2015-11-01 DIAGNOSIS — Z0000 Encounter for general adult medical examination without abnormal findings: Secondary | ICD-10-CM | POA: Diagnosis not present

## 2015-11-01 DIAGNOSIS — N400 Enlarged prostate without lower urinary tract symptoms: Secondary | ICD-10-CM | POA: Diagnosis not present

## 2015-11-02 ENCOUNTER — Telehealth: Payer: Self-pay | Admitting: Internal Medicine

## 2015-11-02 NOTE — Telephone Encounter (Signed)
I have samples in the office he can use until returns to me or NP with formulary alternative list

## 2015-11-02 NOTE — Telephone Encounter (Signed)
Patient scheduled to see Dr. Melvyn Novas on Monday 11/07/15.  Patient does not needs samples, has enough to last until appointment. Patient aware of appointment and aware that he needs to bring formulary to appointment. Nothing further needed.

## 2015-11-02 NOTE — Telephone Encounter (Signed)
Spoke with pt's wife. States that [name] [name] is going to cost to much. It's $194 for a 30 day supply. They can't afford this. Would like an alternative.  MW - please advise. Thanks.

## 2015-11-03 DIAGNOSIS — J189 Pneumonia, unspecified organism: Secondary | ICD-10-CM | POA: Diagnosis not present

## 2015-11-03 DIAGNOSIS — I739 Peripheral vascular disease, unspecified: Secondary | ICD-10-CM | POA: Diagnosis not present

## 2015-11-03 DIAGNOSIS — R0600 Dyspnea, unspecified: Secondary | ICD-10-CM | POA: Diagnosis not present

## 2015-11-03 DIAGNOSIS — J441 Chronic obstructive pulmonary disease with (acute) exacerbation: Secondary | ICD-10-CM | POA: Diagnosis not present

## 2015-11-03 DIAGNOSIS — I10 Essential (primary) hypertension: Secondary | ICD-10-CM | POA: Diagnosis not present

## 2015-11-07 ENCOUNTER — Ambulatory Visit (INDEPENDENT_AMBULATORY_CARE_PROVIDER_SITE_OTHER): Payer: Commercial Managed Care - HMO | Admitting: Nurse Practitioner

## 2015-11-07 ENCOUNTER — Encounter: Payer: Self-pay | Admitting: Internal Medicine

## 2015-11-07 ENCOUNTER — Encounter: Payer: Self-pay | Admitting: Nurse Practitioner

## 2015-11-07 ENCOUNTER — Ambulatory Visit (INDEPENDENT_AMBULATORY_CARE_PROVIDER_SITE_OTHER): Payer: Commercial Managed Care - HMO | Admitting: Internal Medicine

## 2015-11-07 ENCOUNTER — Telehealth: Payer: Self-pay | Admitting: Nurse Practitioner

## 2015-11-07 VITALS — BP 102/60 | HR 68 | Ht 66.0 in | Wt 173.8 lb

## 2015-11-07 VITALS — BP 115/65 | HR 66 | Ht 67.0 in | Wt 173.6 lb

## 2015-11-07 DIAGNOSIS — G309 Alzheimer's disease, unspecified: Secondary | ICD-10-CM | POA: Diagnosis not present

## 2015-11-07 DIAGNOSIS — J449 Chronic obstructive pulmonary disease, unspecified: Secondary | ICD-10-CM

## 2015-11-07 DIAGNOSIS — J9611 Chronic respiratory failure with hypoxia: Secondary | ICD-10-CM

## 2015-11-07 DIAGNOSIS — I10 Essential (primary) hypertension: Secondary | ICD-10-CM

## 2015-11-07 DIAGNOSIS — I739 Peripheral vascular disease, unspecified: Secondary | ICD-10-CM

## 2015-11-07 DIAGNOSIS — R413 Other amnesia: Secondary | ICD-10-CM

## 2015-11-07 DIAGNOSIS — F0280 Dementia in other diseases classified elsewhere without behavioral disturbance: Secondary | ICD-10-CM

## 2015-11-07 MED ORDER — DONEPEZIL HCL 10 MG PO TABS: 10.0000 mg | ORAL_TABLET | Freq: Every day | ORAL | Status: DC

## 2015-11-07 MED ORDER — FLUTICASONE FUROATE-VILANTEROL 100-25 MCG/INH IN AEPB: 1.0000 | INHALATION_SPRAY | Freq: Every morning | RESPIRATORY_TRACT | Status: DC

## 2015-11-07 NOTE — Patient Instructions (Signed)
Continue Aricept at current dose Will refill Continue Namenda at current dose Continue  gabapentin  twice daily  at risk for falls be careful with ambulation F/U in 6 months next with Dr. Krista Blue

## 2015-11-07 NOTE — Telephone Encounter (Signed)
Called to check status of PAP. [employer] relayed to me that patient was denied. Per [employer] Patient need's  to write out on piece of paper how these items are a month.  1.Grocery's  2.Light bill 3. Water bill 4.Rent. Dr. Krista Blue will need to write a letter of appeal of why she think's patient needs's to be on Namenda XR 28mg 

## 2015-11-07 NOTE — Patient Instructions (Addendum)
Stop Brovana and Budesonide   Start BREO one click each am x 2 puffs   If happy, fill the prescription > if not happy return to see [name] NP with your formulary   Pulmonary follow up is as needed

## 2015-11-07 NOTE — Progress Notes (Signed)
[facility]  PATIENT: [name] [name] DOB: [birth year]   REASON FOR VISIT: Follow-up for Alzheimer's dementia HISTORY FROM: Patient and wife    HISTORY OF PRESENT ILLNESS:[name] [name] is a 82 years old right-handed African American male, accompanied by his wife at today's clinical visit, he was a patient of Dr. Jules Husbands since February 2009, for evaluation of memory trouble, his primary care physician is Dr. Ivery Quale.  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 Some anxiety, improved on Remeron and prn Xanax.  He is occasionally irritable and easily provoked to anger. Wife says that he has decreased appetite recently. UPDATE June 2nd 2015: He now came in complaining of bilateral feet and hands paresthesia, which is new since Feb 2015, his symptoms are getting worse, he could hardly picking up things, he also complains of mild gait difficulty, low back pain, no bowel and bladder incontinence,  Recent laboratory evaluation in May 21st 2013, showed mild anemia, hemoglobin 11.8, glucose 96, B12 1004, normal TSH, protein electrophoresis showed elevated M spike a 1.3, mild increased of creatinine 1.57, with GFR of 33, normal liver function tests previously. He is going to be evaluated by hematologist Dr. Judeen Hammans  soon. He also complains of low back pain, no significant neck pain,  UPDATE July 6th 2015: He was evaluated by Dr. Benay Spice for chronic monoclonal IgG lambda protein. There is no clinical evidence of progression to multiple myeloma. The M spike has not changed significantly over many years.  He still has numbness tinglings in his fingers, even to his arms and chest, frightening to him. We have reviewed MRI lumbar together, L5-S1: pseudo-disc bulging and facet hypertrophy with mild-moderate right and moderate left foraminal stenosis. L3-4: disc bulging and facet hypertrophy with mild biforaminal stenosis. L4-5: pseudo-disc bulging and facet hypertrophy with mild biforaminal stenosis. notable for left renal cyst (5.7cm).  MRI cervical spine (without) demonstrating: C5-6: disc bulging and uncovertebral joint hypertrophy with severe right and moderate left foraminal stenosis. Disc bulging and spondylosis from C4-5 to T2-3. He has history of chronic renal disease, is going to be see by his nephrologist Dr. Posey Pronto in August 2015, I have suggested continued followup with his incidental finding of a left renal cyst with his nephrologist Dr. Posey Pronto  UPDATE April 7th 2016: he continue has slow worsening memory trouble, taking Namenda xr 28 mg, Aricept 10 mg daily, COPD, recovering from pneumonia, Complains of excessive sleepiness,  He is not eating well, he has bad taste in his mouth, Ambulate without difficulty, he has stopped nortriptyline, taking gabapentin for lower extremity pain,  UPDATE 05/02/15 [name] [name], 82 year old male returns for follow-up. He has a history of Alzheimer's dementia and paresthesias of the feet.is currently on Aricept 10 mg daily and Namenda 28 mg extended release through patient assistance. He has not reapplied for his assistance and  took his last pill today according to the wife. He is also taking gabapentin 300 mg 3 times daily for his paresthesias however he is drowsy from  the medication and was encouraged to decrease to 2 times a day. No recent falls and he does not use an assistive device. No longer drives. UPDATE 04/17/2017CM [name] [name], 82 year old male returns for follow-up he has a history of Alzheimer's dementia and is currently on Aricept 10 mg daily and Namenda 28 mg extended release. He has lower extremity paresthesias which are controlled on gabapentin 300 mg twice daily. Appetite is reportedly good. He is sleeping well at night. He has not had any falls. Memory is stable he returns for reevaluation  REVIEW OF SYSTEMS: Full 14 system review of systems performed and notable only for those listed, all others are neg:  Constitutional: neg  Cardiovascular: neg Ear/Nose/Throat: neg  Skin: neg Eyes: neg Respiratory: COPD Gastroitestinal: neg  Hematology/Lymphatic: Anemia  Endocrine: neg Musculoskeletal: Back pain Allergy/Immunology: neg Neurological: Paresthesias in the lower extremities, memory loss  Psychiatric: Confusion Sleep : neg   ALLERGIES: No Known Allergies  HOME MEDICATIONS: Outpatient Prescriptions Prior to Visit  Medication Sig Dispense Refill  . acetaminophen (TYLENOL) 500 MG tablet Take 1,000 mg by mouth every 6 (six) hours as needed for headache.    . albuterol (PROVENTIL) (2.5 MG/3ML) 0.083% nebulizer solution Take 2.5 mg by nebulization every 6 (six) hours as needed for wheezing or shortness of breath.    Marland Kitchen arformoterol (BROVANA) 15 MCG/2ML NEBU Take 2 mLs (15 mcg total) by nebulization 2 (two) times daily. 120 mL 6  . aspirin 325 MG tablet Take 325 mg by mouth daily.      Marland Kitchen atorvastatin (LIPITOR) 20 MG tablet Take 1 tablet (20 mg total) by mouth daily. 90 tablet 3  . benzonatate (TESSALON) 100 MG capsule Take 100-200 mg by mouth 3 (three) times daily as needed for cough. Reported on 10/18/2015    . budesonide (PULMICORT) 0.25 MG/2ML nebulizer solution Use one vial twice daily with brovana 120 mL 11  . calcitRIOL (ROCALTROL) 0.5  MCG capsule Take 0.5 mcg by mouth daily.    . cetirizine (ZYRTEC) 10 MG tablet Take 10 mg by mouth daily as needed for allergies.    . Cholecalciferol (VITAMIN D3) 5000 UNITS TABS Take 5,000 Units by mouth every other day. Reported on 09/30/2015    . docusate sodium (COLACE) 100 MG capsule Take 300 mg by mouth daily as needed for mild constipation. Reported on 09/30/2015    . donepezil (ARICEPT) 10 MG tablet Take 1 tablet (10 mg total) by mouth at bedtime. 90 tablet 1  . feeding supplement, ENSURE ENLIVE, (ENSURE ENLIVE) LIQD Take 237 mLs by mouth 2 (two) times daily between meals. 30 Bottle 6  . ferrous sulfate 325 (65 FE) MG tablet Take 325 mg by mouth 2 (two) times daily with a meal.    . finasteride (PROSCAR) 5 MG tablet Take 5 mg by mouth daily.     . fluticasone (FLONASE) 50 MCG/ACT nasal spray Place 2 sprays into both nostrils daily as needed for allergies.     Marland Kitchen gabapentin (NEURONTIN) 300 MG capsule Take 1 capsule (300 mg total) by mouth 3 (three) times daily. 270 capsule 3  . guaiFENesin-dextromethorphan (ROBITUSSIN DM) 100-10 MG/5ML syrup Take 5 mLs by mouth every 4 (four) hours as needed for cough. 118 mL 0  . ipratropium (ATROVENT) 0.06 % nasal spray Place 1 spray into both nostrils 4 (four) times  daily.    . lansoprazole (PREVACID) 30 MG capsule Take 1 capsule by mouth daily.    Marland Kitchen latanoprost (XALATAN) 0.005 % ophthalmic solution Place 1 drop into both eyes at bedtime.     Marland Kitchen lubiprostone (AMITIZA) 24 MCG capsule Take 24 mcg by mouth daily with breakfast.    . memantine (NAMENDA XR) 28 MG CP24 24 hr capsule TAKE ONE CAPSULE BY MOUTH ONCE DAILY. 90 capsule 3  . metoCLOPramide (REGLAN) 10 MG tablet Take 10 mg by mouth daily before breakfast. Reported on 09/30/2015    . metoprolol succinate (TOPROL-XL) 25 MG 24 hr tablet Take 1 tablet (25 mg total) by mouth daily. 90 tablet 2  . mirtazapine (REMERON) 30 MG tablet Take 30 mg by mouth at bedtime.    . Multiple Vitamin (MULTIVITAMIN WITH  MINERALS) TABS tablet Take 1 tablet by mouth daily.    . nitroGLYCERIN (NITROSTAT) 0.4 MG SL tablet Place 1 tablet (0.4 mg total) under the tongue every 5 (five) minutes as needed for chest pain. 90 tablet 3  . polyethylene glycol (MIRALAX / GLYCOLAX) packet Take 17 g by mouth daily as needed for moderate constipation.     . Tamsulosin HCl (FLOMAX) 0.4 MG CAPS Take 0.8 mg by mouth at bedtime.     . vitamin B-12 (CYANOCOBALAMIN) 1000 MCG tablet Take 1,000 mcg by mouth daily.    . promethazine (PHENERGAN) 12.5 MG tablet Take 12.5 mg by mouth 2 (two) times daily as needed for nausea. Reported on 11/07/2015     No facility-administered medications prior to visit.    PAST MEDICAL HISTORY: Past Medical History  Diagnosis Date  . PVD (peripheral vascular disease) ([place])     s/p left renal artery stent and left iliac stent  . Hypertension   . Hyperlipemia   . GERD (gastroesophageal reflux disease)   . COPD (chronic obstructive pulmonary disease) ([place])   . Monoclonal gammopathy   . BPH (benign prostatic hyperplasia)   . Memory loss   . CAD (coronary artery disease)     s/p CABG in 1997 with known atretic LIMA to LAD and 40% LM.    Marland Kitchen Chronic kidney disease (CKD), stage III (moderate)   . Carotid artery stenosis     s/p left CEA.  Carotid dopplers 09/2013 60-79% right ICA stenosis and 1-39% left ICA stenosis of left CEA  . Chronic chest wall pain   . Peptic ulcer disease   . Depression with anxiety   . Retinal artery occlusion   . On home oxygen therapy     at bedtime-2 L/m nasally..    . Neuromuscular disorder (H[facility])     fingertips numbness"  . DEMENTIA     "short term memory issues"  . Alzheimer disease   . Cough     cough with productive white to yellow sputum.  Marland Kitchen Anemia   . Glaucoma     bilateral    PAST SURGICAL HISTORY: Past Surgical History  Procedure Laterality Date  . Left cea  2002    carotid stent-left-   . Cataract extraction w/phaco  09/18/2011    Procedure: CATARACT  EXTRACTION PHACO AND INTRAOCULAR LENS PLACEMENT (IOC);  Surgeon: Elta Guadeloupe T. Gershon Crane, MD;  Location: [facility];  Service: Ophthalmology;  Laterality: Right;  CDE: 6.19  . Cardiac surgery    . Renal artery stent    . Coronary artery bypass graft  1997    "nerve pain at area"  . Back surgery  2011  lower back -disc  . Esophagogastroduodenoscopy (egd) with propofol N/A 07/11/2015    Procedure: ESOPHAGOGASTRODUODENOSCOPY (EGD) WITH PROPOFOL;  Surgeon: Laurence Spates, MD;  Location: [facility];  Service: Endoscopy;  Laterality: N/A;  . Colonoscopy with propofol N/A 07/11/2015    Procedure: COLONOSCOPY WITH PROPOFOL;  Surgeon: Laurence Spates, MD;  Location: [facility];  Service: Endoscopy;  Laterality: N/A;    FAMILY HISTORY: Family History  Problem Relation Age of Onset  . Anesthesia problems Neg Hx   . Hypotension Neg Hx   . Malignant hyperthermia Neg Hx   . Pseudochol deficiency Neg Hx   . High blood pressure    . High Cholesterol    . Hypertension Father   . Hypertension Mother     SOCIAL HISTORY: Social History   Social History  . Marital Status: Married    Spouse Name: Peter Congo  . Number of Children: 8  . Years of Education: 13   Occupational History  .      Retired   Social History Main Topics  . Smoking status: Former Smoker -- 0.50 packs/day for 40 years    Types: Cigarettes    Quit date: 07/23/1978  . Smokeless tobacco: Never Used  . Alcohol Use: No  . Drug Use: No  . Sexual Activity: Not on file   Other Topics Concern  . Not on file   Social History Narrative   Patient lives at home with his wife. Peter Congo). Patient is retired. Patient has one Year of college.   Right handed.   Caffeine- sometimes     PHYSICAL EXAM  Filed Vitals:   11/07/15 1049  BP: 115/65  Pulse: 66  Height: _0  (1.702 m)  Weight: 173 lb 9.6 oz (78.744 kg)   Body mass index is 27.18 kg/(m^2). Generalized: Well developed, in no acute distress, well-groomed  Head: normocephalic  and atraumatic,. Oropharynx benign  Neck: Supple, no carotid bruits  Cardiac: Regular rate rhythm, no murmur  Musculoskeletal: No deformity   Neurological examination   Mentation: Alert MMSE 12/30  missing items in orientation, calculation and 3 of 3 recall.AFT 3. Clock drawing 14. Follows all commands speech and language fluent.   Cranial nerve II-XII: Pupils were equal round reactive to light extraocular movements were full, visual field were full on confrontational test. Facial sensation and strength were normal. hearing was intact to finger rubbing bilaterally. Uvula tongue midline. head turning and shoulder shrug were normal and symmetric.Tongue protrusion into cheek strength was normal. Motor: normal bulk and tone, full strength in the BUE, BLE, fine finger movements normal, no pronator drift. No focal weakness Sensory: intact to light touch  Coordination: finger-nose-finger, heel-to-shin bilaterally, no dysmetria Reflexes: Symmetric upper and lower, plantar responses were flexor bilaterally. Gait and Station: Rising up from seated position without assistance, stooped stance, moderate stride, good arm swing, smooth turning, cautious gait, no assistive device   DIAGNOSTIC DATA (LABS, IMAGING, TESTING) - Lab Results  Component Value Date   CHOL 139 08/04/2015   HDL 27* 08/04/2015   LDLCALC 74 08/04/2015   LDLDIRECT 110.0 12/09/2014   TRIG 188* 08/04/2015   CHOLHDL 5.1* 08/04/2015    ASSESSMENT AND PLAN 82 y.o. year old male has a past medical history of; Hypertension; Hyperlipemia; Monoclonal gammopathy; DEMENTIA; Alzheimer disease; Memory loss; CAD (coronary artery disease); Chronic kidney disease (CKD), stage III (moderate); On home oxygen therapy. Here to follow up. He also has bilateral lower extremity paresthesia, electrodiagnostic study showed no evidence of large fiber peripheral neuropathy, lumbar  and cervical degenerative disc disease, likely contribute to  his complaints.   Continue Aricept at current dose  Continue Namenda at current dose Continue  gabapentin  twice daily  at risk for falls be careful with ambulation F/U in 6 months next with Dr. Luan Pulling, [facility], [facility], [facility] [address], [place], [place] [address] [phone]

## 2015-11-07 NOTE — Progress Notes (Signed)
I have reviewed and agreed above plan. 

## 2015-11-07 NOTE — Progress Notes (Signed)
Subjective:    Patient ID: [name] [name], male    DOB: Feb 27, 1931  MRN: [id number]    Brief patient profile:  82 year old male quit smoking in 1980s  Followed previously by Dr Joya Gaskins for copd with gold III criteria April 2016    History of Present Illness  02/25/2015 Follow up : COPD  NP ov    Patient was recently seen for a COPD flare. He was treated with a Z-Pak and prednisone. He did improve, however , symptoms continued to linger and was called in amoxicillin 1 week prior to OV   He has a few days left of this. He is starting to feel better with decreased cough, congestion. He is now on oxygen 2 L. Wife requests a Marine scientist. rec Finish Antibiotics as directed.  Mucinex DM Twice daily  As needed  Cough/congestion  Wear Oxygen 2l/m   Order for portable oxygen concentrator .     04/26/2015  Ext ov/Ivonna Kinnick establish care re: GOLD III copd/ noct 02 dep / maint on brovana but no bud Chief Complaint  Patient presents with  . Follow-up    Pt states his breathing is overall doing well. He still c/o chest congestion, but not coughing much.   using 02 prn   A few hours a day not with exertion, not necessarily at hs  Worse swelling, has kidney doctor and heart doctor but unsure who to call re swelling  Continue 02 2lpm at bedtime for now  - ok to leave off during the day and just use with heavy exertion  Prednisone 10 mg take  4 each am x 2 days,   2 each am x 2 days,  1 each am x 2 days and stop  Add budesonide 0.25 mg twice daily with nebulizer treatments  Let your kidney doctor know about the swelling     05/24/2015 Follow up : GOLD III COPD , noct O2  Pt returns with his wife for 1 month follow up  He has moderate dementia on aricept and namenda.  Last ov with COPD flare , tx w/ pred taper .  Budesonide Neb was added to his Brovana neb regimen  She says he is doing better w/ decreased cough and wheezing.  Does have drippy nose on/off.  PVX and Flu shot are  utd Discussed prevnar vaccine for today  rec No change rx   08/25/2015  f/u ov/Emaly Boschert re: COPD III copd/ noct 02 and brovana/ bud maint  Chief Complaint  Patient presents with  . Follow-up    Cough has not improved- coughing up min yellow sputum at times. He also c/o constant rhinitis and PND.   not that much noct or am cough mostly daytime despite "constant rhinitis" rec Prevacid 30 mg Take 30- 60 min before your first and last meals of the day  GERD diet     11/07/2015  f/u ov/Alani Sabbagh re: brovana/bud but can no longer afford / 02 2lpm hs only  Chief Complaint  Patient presents with  . Follow-up    Cough has improved some. He has had minimal wheezing and chest tightness- mainly at night and relates to allergies. His cough is prod at times with clear sputum.    Not limited by breathing from desired activities  But very sedentary   No obvious day to day or daytime variability or assoc   cp or chest tightness,   or overt sinus or hb symptoms. No unusual exp hx or h/o childhood pna/  asthma or knowledge of premature birth.  Sleeping ok without nocturnal  or early am exacerbation  of respiratory  c/o's or need for noct saba. Also denies any obvious fluctuation of symptoms with weather or environmental changes or other aggravating or alleviating factors except as outlined above   Current Medications, Allergies, Complete Past Medical History, Past Surgical History, Family History, and Social History were reviewed in [employer] record.  ROS  The following are not active complaints unless bolded sore throat, dysphagia, dental problems, itching, sneezing,  nasal congestion better on zyrtec or excess/ purulent secretions, ear ache,   fever, chills, sweats, unintended wt loss, classically pleuritic or exertional cp, hemoptysis,  orthopnea pnd or leg swelling, presyncope, palpitations, abdominal pain, anorexia, nausea, vomiting, diarrhea  or change in bowel or bladder habits,  change in stools or urine, dysuria,hematuria,  rash, arthralgias, visual complaints, headache, numbness, weakness or ataxia or problems with walking or coordination,  change in mood/affect or memory.              Objective:   Physical Exam  GEN: A/Ox3; pleasant , NAD, elderly stoic amb bm nad      11/07/2015        174    08/25/15 175 lb 9.6 oz (79.652 kg)  07/11/15 170 lb (77.111 kg)  06/09/15 177 lb (80.287 kg)    Vital signs reviewed    HEENT:  [place]/AT,  EACs-clear, TMs-wnl, NOSE-clear, THROAT-clear, no lesions, no postnasal drip or exudate noted. - edentulous with dentures in place  NECK:  Supple w/ fair ROM; no JVD; normal carotid impulses w/o bruits; no thyromegaly or nodules palpated; no lymphadenopathy.  RESP   Completely clear bilaterally to A and P   CARD:  RRR, no m/r/g  , no peripheral edema, pulses intact, no cyanosis or clubbing - no sign edema   GI:   Soft & nt; nml bowel sounds; no organomegaly or masses detected.  Musco: Warm bil, no deformities or joint swelling noted.   Neuro: alert, no focal deficits noted.    Skin: Warm, no lesions or rashes      CXR PA and Lateral:   08/25/2015 :    I personally reviewed images and agree with radiology impression as follows:   Stable coarse interstitial densities throughout both lungs, right greater than left, most consistent with scarring or pulmonary fibrosis. No significant changes noted compared to prior exam.        Assessment & Plan:

## 2015-11-07 NOTE — Telephone Encounter (Signed)
Dr. Krista Blue I have spoke to patient's wife and I have number's on Item's below . Dr. Krista Blue will you please write a letter to Attention appeals [employer]. Please let me know when letter is done I will resubmit to [place] . Thanks Hinton Dyer

## 2015-11-08 MED ORDER — MEMANTINE HCL 10 MG PO TABS: 10.0000 mg | ORAL_TABLET | Freq: Two times a day (BID) | ORAL | Status: DC

## 2015-11-08 NOTE — Telephone Encounter (Signed)
Called and spoke to patient's wife and relayed Hoyle Sauer can call him in the Generic. Patient's wife was fine with . Patient's assistance appeal CX .

## 2015-11-08 NOTE — Assessment & Plan Note (Signed)
Enrolled in Gold COPD program 10/27/2014 Spirometry  10/27/14  FEV1  1.86 (73%) ratio 66  - Added bud to brovana 04/26/2015 > improved but could not afford deductible > changed to [facility] 11/07/2015   - The proper method of use, as well as anticipated side effects, of a dry powder inhaler are discussed and demonstrated to the patient. Improved effectiveness after extensive coaching during this visit to a level of approximately 90 % from a baseline of 75 %  Using elipta device   I had an extended discussion with the patient reviewing all relevant studies completed to date and  lasting 15 to 20 minutes of a 25 minute visit   Formulary restrictions will be an ongoing challenge for the forseable future and I would be happy to pick an alternative if the pt will first  provide me a list of them but pt  will need to return here for training for any new device that is required eg dpi vs hfa vs respimat.    In meantime we can always provide samples so the patient never runs out of any needed respiratory medications.    Each maintenance medication was reviewed in detail including most importantly the difference between maintenance and prns and under what circumstances the prns are to be triggered using an action plan format that is not reflected in the computer generated alphabetically organized AVS.    Please see instructions for details which were reviewed in writing and the patient given a copy highlighting the part that I personally wrote and discussed at today's ov.

## 2015-11-08 NOTE — Addendum Note (Signed)
Addended by: Evlyn Courier on: 11/08/2015 08:58 AM   Modules accepted: Orders, Medications

## 2015-11-08 NOTE — Assessment & Plan Note (Signed)
04/26/2015   Walked RA  2 laps @ 185 ft each stopped due to  Leg pain, slow pace, sats at very end down to 85%  - 08/25/2015   [facility] RA  2 laps @ 185 ft each stopped due to  Tired, no sob, sats 87% (walking much more than he usually does  As of 11/07/2015 rec 2lpm hs and prn daytime, certainly no need at rest

## 2015-11-08 NOTE — Telephone Encounter (Signed)
Dana please see Dr. Greer Pickerel information. The generic form can be sent to regular pharmacy which is [facility]. Please make sure with wife and I will send new RX.

## 2015-11-14 DIAGNOSIS — J9611 Chronic respiratory failure with hypoxia: Secondary | ICD-10-CM | POA: Diagnosis not present

## 2015-11-14 DIAGNOSIS — J441 Chronic obstructive pulmonary disease with (acute) exacerbation: Secondary | ICD-10-CM | POA: Diagnosis not present

## 2015-11-14 DIAGNOSIS — J449 Chronic obstructive pulmonary disease, unspecified: Secondary | ICD-10-CM | POA: Diagnosis not present

## 2015-12-02 ENCOUNTER — Other Ambulatory Visit: Payer: Self-pay | Admitting: *Deleted

## 2015-12-02 DIAGNOSIS — M549 Dorsalgia, unspecified: Secondary | ICD-10-CM | POA: Diagnosis not present

## 2015-12-02 DIAGNOSIS — B370 Candidal stomatitis: Secondary | ICD-10-CM | POA: Diagnosis not present

## 2015-12-02 DIAGNOSIS — I10 Essential (primary) hypertension: Secondary | ICD-10-CM | POA: Diagnosis not present

## 2015-12-02 DIAGNOSIS — R630 Anorexia: Secondary | ICD-10-CM | POA: Diagnosis not present

## 2015-12-02 NOTE — Patient Outreach (Signed)
[facility]) Care Management   12/02/2015  [name] [name] 03-30-1931 [id number]  [name] [name] is an 82 y.o. male  Subjective:  Patient states he is doing good this morning Wife states that patient will be seeing MD this afternoon regarding off and on back pain Wife also reports poor appetite, but no weight loss, weight is stable at 173#  Patient has cardiology  labs on 5/15 Patient sees Dr. Oletta Lamas on 5/23 Patient sees Dr. Fransico Him 5/26  Wife states that MD changed Brov[facility] to [facility] due to cost.  Objective:   BP 120/60 mmHg  Pulse 60  Resp 18  Wt 173 lb (78.472 kg)  SpO2 92% Review of Systems  Respiratory: Positive for shortness of breath.   Gastrointestinal: Positive for nausea and constipation.  Musculoskeletal: Positive for back pain.       Off and on, seeing MD today due to pain  Psychiatric/Behavioral: Positive for memory loss.    Physical Exam  Cardiovascular: Normal rate.   GI: Soft.  Musculoskeletal: Normal range of motion.  Neurological: He is alert.  Skin: Skin is warm and dry.    Encounter Medications:   Outpatient Encounter Prescriptions as of 12/02/2015  Medication Sig Note  . acetaminophen (TYLENOL) 500 MG tablet Take 1,000 mg by mouth every 6 (six) hours as needed for headache.   . albuterol (PROVENTIL) (2.5 MG/3ML) 0.083% nebulizer solution Take 2.5 mg by nebulization every 6 (six) hours as needed for wheezing or shortness of breath.   Marland Kitchen aspirin 325 MG tablet Take 325 mg by mouth daily.     Marland Kitchen atorvastatin (LIPITOR) 20 MG tablet Take 1 tablet (20 mg total) by mouth daily.   . calcitRIOL (ROCALTROL) 0.5 MCG capsule Take 0.5 mcg by mouth daily.   . cetirizine (ZYRTEC) 10 MG tablet Take 10 mg by mouth daily as needed for allergies.   . Cholecalciferol (VITAMIN D3) 5000 UNITS TABS Take 5,000 Units by mouth every other day. Reported on 09/30/2015   . docusate sodium (COLACE) 100 MG capsule Take 300 mg by mouth daily as needed for mild  constipation. Reported on 09/30/2015   . donepezil (ARICEPT) 10 MG tablet Take 1 tablet (10 mg total) by mouth at bedtime.   . feeding supplement, ENSURE ENLIVE, (ENSURE ENLIVE) LIQD Take 237 mLs by mouth 2 (two) times daily between meals.   . ferrous sulfate 325 (65 FE) MG tablet Take 325 mg by mouth 2 (two) times daily with a meal.   . finasteride (PROSCAR) 5 MG tablet Take 5 mg by mouth daily.    . fluticasone (FLONASE) 50 MCG/ACT nasal spray Place 2 sprays into both nostrils daily as needed for allergies.    . fluticasone furoate-vilanterol (BREO ELLIPTA) 100-25 MCG/INH AEPB Inhale 1 puff into the lungs every morning.   . gabapentin (NEURONTIN) 300 MG capsule Take 1 capsule (300 mg total) by mouth 3 (three) times daily. (Patient taking differently: Take 300 mg by mouth 2 (two) times daily. )   . guaiFENesin-dextromethorphan (ROBITUSSIN DM) 100-10 MG/5ML syrup Take 5 mLs by mouth every 4 (four) hours as needed for cough.   Marland Kitchen ipratropium (ATROVENT) 0.06 % nasal spray Place 1 spray into both nostrils 4 (four) times daily.   . lansoprazole (PREVACID) 30 MG capsule Take 1 capsule by mouth daily. 12/02/2015: Taking daily sees GI on 5/23  . latanoprost (XALATAN) 0.005 % ophthalmic solution Place 1 drop into both eyes at bedtime.    Marland Kitchen lubiprostone (AMITIZA) 24  MCG capsule Take 24 mcg by mouth daily with breakfast.   . memantine (NAMENDA) 10 MG tablet Take 1 tablet (10 mg total) by mouth 2 (two) times daily.   . metoCLOPramide (REGLAN) 10 MG tablet Take 10 mg by mouth 2 (two) times daily. Reported on 09/30/2015 10/28/2015: Taking twice a day. Call to MD regarding taking three times a day  . metoprolol succinate (TOPROL-XL) 25 MG 24 hr tablet Take 1 tablet (25 mg total) by mouth daily.   . mirtazapine (REMERON) 30 MG tablet Take 30 mg by mouth at bedtime. 12/02/2015: Wife cutting in 1/2 for15mg   . Multiple Vitamin (MULTIVITAMIN WITH MINERALS) TABS tablet Take 1 tablet by mouth daily.   . nitroGLYCERIN  (NITROSTAT) 0.4 MG SL tablet Place 1 tablet (0.4 mg total) under the tongue every 5 (five) minutes as needed for chest pain.   . OXYGEN 2lpm with sleep   . polyethylene glycol (MIRALAX / GLYCOLAX) packet Take 17 g by mouth daily as needed for moderate constipation.    . Tamsulosin HCl (FLOMAX) 0.4 MG CAPS Take 0.8 mg by mouth at bedtime.    . vitamin B-12 (CYANOCOBALAMIN) 1000 MCG tablet Take 1,000 mcg by mouth daily.   . benzonatate (TESSALON) 100 MG capsule Take 100-200 mg by mouth 3 (three) times daily as needed for cough. Reported on 12/02/2015   . ondansetron (ZOFRAN) 4 MG tablet Take 4 mg by mouth every 8 (eight) hours as needed for nausea or vomiting. Reported on 12/02/2015    No facility-administered encounter medications on file as of 12/02/2015.    Functional Status:   In your present state of health, do you have any difficulty performing the following activities: 10/28/2015 09/30/2015  Hearing? N -  Vision? N -  Difficulty concentrating or making decisions? Y -  Walking or climbing stairs? Y -  Dressing or bathing? Y -  Doing errands, shopping? Y -  Conservation officer, nature and eating ? - Y  Using the Toilet? - N  In the past six months, have you accidently leaked urine? - Y  Do you have problems with loss of bowel control? - Y  Managing your Medications? - Y  Managing your Finances? - Y  Housekeeping or managing your Housekeeping? - Y    Fall/Depression Screening:    Fall Risk  09/27/2015 12/30/2013  Falls in the past year? No No  Risk for fall due to : Impaired balance/gait Other (Comment)  Risk for fall due to (comments): - neuropathy   PHQ 2/9 Scores 09/30/2015 09/27/2015  PHQ - 2 Score 0 -  Exception Documentation - Medical reason    Assessment:   COPD: Medications changes were made. Wife reports increased congestion. PAIN: seeing MD today for off/on back pain he has had for the past month. Plan:  [facility] CM Care Plan Problem One        Most Recent Value   Care Plan Problem One   COPD knowledge deficit   Role Documenting the Problem One  Care Management Coordinator   Care Plan for Problem One  Active   [facility] Long Term Goal (31-90 days)  Caregiver will be able to verbalize action plan for COPD exacerbation in the next 60 days   [facility] Long Term Goal Start Date  12/02/15   Interventions for Problem One Long Term Goal  Reviewed medication, reviewed signs and symptoms to report to MD   [facility] CM Short Term Goal #1 (0-30 days)  Caregiver will be able to verbalize understanding  of medications for COPD over the next 21 days   [facility] CM Short Term Goal #1 Start Date  12/02/15   Interventions for Short Term Goal #1  reviewed medications for COPD and changes made     Note to [facility] pharmacist regarding Garlon Hatchet cost and any assistance available Will visit again in June.  Royetta Crochet. Laymond Purser, RN, BSN, [place] [phone]

## 2015-12-03 DIAGNOSIS — J441 Chronic obstructive pulmonary disease with (acute) exacerbation: Secondary | ICD-10-CM | POA: Diagnosis not present

## 2015-12-03 DIAGNOSIS — I10 Essential (primary) hypertension: Secondary | ICD-10-CM | POA: Diagnosis not present

## 2015-12-03 DIAGNOSIS — R0600 Dyspnea, unspecified: Secondary | ICD-10-CM | POA: Diagnosis not present

## 2015-12-03 DIAGNOSIS — I739 Peripheral vascular disease, unspecified: Secondary | ICD-10-CM | POA: Diagnosis not present

## 2015-12-05 ENCOUNTER — Other Ambulatory Visit: Payer: Commercial Managed Care - HMO

## 2015-12-06 ENCOUNTER — Other Ambulatory Visit (INDEPENDENT_AMBULATORY_CARE_PROVIDER_SITE_OTHER): Payer: Commercial Managed Care - HMO | Admitting: *Deleted

## 2015-12-06 DIAGNOSIS — I2583 Coronary atherosclerosis due to lipid rich plaque: Principal | ICD-10-CM

## 2015-12-06 DIAGNOSIS — I2510 Atherosclerotic heart disease of native coronary artery without angina pectoris: Secondary | ICD-10-CM | POA: Diagnosis not present

## 2015-12-06 LAB — LIPID PANEL
Cholesterol: 156 mg/dL (ref 125–200)
HDL: 29 mg/dL — ABNORMAL LOW (ref >=40)
LDL CALC: 85 mg/dL (ref <130)
Total CHOL/HDL Ratio: 5.4 Ratio — ABNORMAL HIGH (ref <=5.0)
Triglycerides: 209 mg/dL — ABNORMAL HIGH (ref <150)
VLDL: 42 mg/dL — ABNORMAL HIGH (ref <30)

## 2015-12-06 LAB — ALT: ALT: 13 U/L (ref 9–46)

## 2015-12-12 ENCOUNTER — Telehealth: Payer: Self-pay | Admitting: Adult Health

## 2015-12-12 NOTE — Telephone Encounter (Signed)
Reports Breo not working well and not tolerating. Pt feels that he has thrush C/o Sore throat and tongue pain - mucus is white. Pt wife states that patient's tongue is white/red from thrush Requested an appt to discuss meds - pt bringing formulary to appt.  Scheduled with TP 12/13/15 at 9:15 Nothing further needed.

## 2015-12-13 ENCOUNTER — Ambulatory Visit: Payer: Commercial Managed Care - HMO | Admitting: Adult Health

## 2015-12-13 ENCOUNTER — Emergency Department (HOSPITAL_COMMUNITY): Payer: Commercial Managed Care - HMO

## 2015-12-13 ENCOUNTER — Encounter (HOSPITAL_COMMUNITY): Payer: Self-pay | Admitting: Emergency Medicine

## 2015-12-13 ENCOUNTER — Inpatient Hospital Stay (HOSPITAL_COMMUNITY)
Admission: EM | Admit: 2015-12-13 | Discharge: 2015-12-15 | DRG: 190 | Disposition: A | Discharge status: Home / Self Care | Payer: Commercial Managed Care - HMO | Attending: Internal Medicine | Admitting: Internal Medicine

## 2015-12-13 DIAGNOSIS — I129 Hypertensive chronic kidney disease with stage 1 through stage 4 chronic kidney disease, or unspecified chronic kidney disease: Secondary | ICD-10-CM | POA: Diagnosis present

## 2015-12-13 DIAGNOSIS — K219 Gastro-esophageal reflux disease without esophagitis: Secondary | ICD-10-CM | POA: Diagnosis present

## 2015-12-13 DIAGNOSIS — I739 Peripheral vascular disease, unspecified: Secondary | ICD-10-CM | POA: Diagnosis not present

## 2015-12-13 DIAGNOSIS — N400 Enlarged prostate without lower urinary tract symptoms: Secondary | ICD-10-CM | POA: Diagnosis present

## 2015-12-13 DIAGNOSIS — N183 Chronic kidney disease, stage 3 unspecified: Secondary | ICD-10-CM | POA: Diagnosis present

## 2015-12-13 DIAGNOSIS — Z7982 Long term (current) use of aspirin: Secondary | ICD-10-CM | POA: Diagnosis not present

## 2015-12-13 DIAGNOSIS — Z6827 Body mass index (BMI) 27.0-27.9, adult: Secondary | ICD-10-CM

## 2015-12-13 DIAGNOSIS — F418 Other specified anxiety disorders: Secondary | ICD-10-CM | POA: Diagnosis present

## 2015-12-13 DIAGNOSIS — R630 Anorexia: Secondary | ICD-10-CM | POA: Diagnosis present

## 2015-12-13 DIAGNOSIS — R05 Cough: Secondary | ICD-10-CM | POA: Diagnosis not present

## 2015-12-13 DIAGNOSIS — G309 Alzheimer's disease, unspecified: Secondary | ICD-10-CM | POA: Diagnosis present

## 2015-12-13 DIAGNOSIS — J440 Chronic obstructive pulmonary disease with acute lower respiratory infection: Principal | ICD-10-CM | POA: Diagnosis present

## 2015-12-13 DIAGNOSIS — J9611 Chronic respiratory failure with hypoxia: Secondary | ICD-10-CM | POA: Diagnosis not present

## 2015-12-13 DIAGNOSIS — I2510 Atherosclerotic heart disease of native coronary artery without angina pectoris: Secondary | ICD-10-CM | POA: Diagnosis present

## 2015-12-13 DIAGNOSIS — J441 Chronic obstructive pulmonary disease with (acute) exacerbation: Secondary | ICD-10-CM | POA: Diagnosis not present

## 2015-12-13 DIAGNOSIS — F0390 Unspecified dementia without behavioral disturbance: Secondary | ICD-10-CM | POA: Diagnosis not present

## 2015-12-13 DIAGNOSIS — R627 Adult failure to thrive: Secondary | ICD-10-CM | POA: Diagnosis present

## 2015-12-13 DIAGNOSIS — R1310 Dysphagia, unspecified: Secondary | ICD-10-CM | POA: Diagnosis not present

## 2015-12-13 DIAGNOSIS — J449 Chronic obstructive pulmonary disease, unspecified: Secondary | ICD-10-CM | POA: Diagnosis not present

## 2015-12-13 DIAGNOSIS — Z95828 Presence of other vascular implants and grafts: Secondary | ICD-10-CM | POA: Diagnosis not present

## 2015-12-13 DIAGNOSIS — H409 Unspecified glaucoma: Secondary | ICD-10-CM | POA: Diagnosis present

## 2015-12-13 DIAGNOSIS — E785 Hyperlipidemia, unspecified: Secondary | ICD-10-CM | POA: Diagnosis present

## 2015-12-13 DIAGNOSIS — J438 Other emphysema: Secondary | ICD-10-CM

## 2015-12-13 DIAGNOSIS — I10 Essential (primary) hypertension: Secondary | ICD-10-CM | POA: Diagnosis present

## 2015-12-13 DIAGNOSIS — Z87891 Personal history of nicotine dependence: Secondary | ICD-10-CM

## 2015-12-13 DIAGNOSIS — Z9981 Dependence on supplemental oxygen: Secondary | ICD-10-CM

## 2015-12-13 DIAGNOSIS — Z951 Presence of aortocoronary bypass graft: Secondary | ICD-10-CM

## 2015-12-13 DIAGNOSIS — J189 Pneumonia, unspecified organism: Secondary | ICD-10-CM | POA: Diagnosis not present

## 2015-12-13 DIAGNOSIS — Z8619 Personal history of other infectious and parasitic diseases: Secondary | ICD-10-CM | POA: Diagnosis present

## 2015-12-13 DIAGNOSIS — F0280 Dementia in other diseases classified elsewhere without behavioral disturbance: Secondary | ICD-10-CM | POA: Diagnosis present

## 2015-12-13 LAB — TROPONIN I: Troponin I: <0.03 ng/mL (ref <0.031)

## 2015-12-13 LAB — CBC WITH DIFFERENTIAL/PLATELET
BASOS ABS: 0 10*3/uL (ref 0.0–0.1)
BASOS PCT: 1 %
Eosinophils Absolute: 0.2 10*3/uL (ref 0.0–0.7)
Eosinophils Relative: 3 %
HEMATOCRIT: 36.4 % — AB (ref 39.0–52.0)
HEMOGLOBIN: 12.3 g/dL — AB (ref 13.0–17.0)
LYMPHS PCT: 43 %
Lymphs Abs: 2.8 10*3/uL (ref 0.7–4.0)
MCH: 33.9 pg (ref 26.0–34.0)
MCHC: 33.8 g/dL (ref 30.0–36.0)
MCV: 100.3 fL — AB (ref 78.0–100.0)
MONO ABS: 0.6 10*3/uL (ref 0.1–1.0)
Monocytes Relative: 8 %
NEUTROS ABS: 3 10*3/uL (ref 1.7–7.7)
NEUTROS PCT: 45 %
Platelets: 199 10*3/uL (ref 150–400)
RBC: 3.63 MIL/uL — AB (ref 4.22–5.81)
RDW: 15 % (ref 11.5–15.5)
WBC: 6.6 10*3/uL (ref 4.0–10.5)

## 2015-12-13 LAB — COMPREHENSIVE METABOLIC PANEL
ALK PHOS: 57 U/L (ref 38–126)
ALT: 21 U/L (ref 17–63)
ANION GAP: 9 (ref 5–15)
AST: 26 U/L (ref 15–41)
Albumin: 4 g/dL (ref 3.5–5.0)
BILIRUBIN TOTAL: 0.4 mg/dL (ref 0.3–1.2)
BUN: 26 mg/dL — ABNORMAL HIGH (ref 6–20)
CALCIUM: 9.8 mg/dL (ref 8.9–10.3)
CO2: 26 mmol/L (ref 22–32)
Chloride: 104 mmol/L (ref 101–111)
Creatinine, Ser: 2.11 mg/dL — ABNORMAL HIGH (ref 0.61–1.24)
GFR, EST AFRICAN AMERICAN: 32 mL/min — AB (ref >60)
GFR, EST NON AFRICAN AMERICAN: 28 mL/min — AB (ref >60)
Glucose, Bld: 86 mg/dL (ref 65–99)
POTASSIUM: 4.6 mmol/L (ref 3.5–5.1)
Sodium: 139 mmol/L (ref 135–145)
TOTAL PROTEIN: 8.5 g/dL — AB (ref 6.5–8.1)

## 2015-12-13 MED ORDER — PANTOPRAZOLE SODIUM 20 MG PO TBEC
20.0000 mg | DELAYED_RELEASE_TABLET | Freq: Every day | ORAL | Status: DC
  Filled 2015-12-13: qty 1

## 2015-12-13 MED ORDER — DEXTROSE 5 % IV SOLN
1.0000 g | INTRAVENOUS | Status: DC
  Administered 2015-12-13 – 2015-12-14 (×2): 1 g via INTRAVENOUS
  Filled 2015-12-13 (×3): qty 10

## 2015-12-13 MED ORDER — METOCLOPRAMIDE HCL 10 MG PO TABS
10.0000 mg | ORAL_TABLET | Freq: Two times a day (BID) | ORAL | Status: DC
  Administered 2015-12-14 – 2015-12-15 (×3): 10 mg via ORAL
  Filled 2015-12-13 (×4): qty 1

## 2015-12-13 MED ORDER — ENOXAPARIN SODIUM 30 MG/0.3ML ~~LOC~~ SOLN
30.0000 mg | SUBCUTANEOUS | Status: DC
  Administered 2015-12-14 (×2): 30 mg via SUBCUTANEOUS
  Filled 2015-12-13 (×2): qty 0.3

## 2015-12-13 MED ORDER — FLUTICASONE FUROATE-VILANTEROL 100-25 MCG/INH IN AEPB
1.0000 | INHALATION_SPRAY | Freq: Every morning | RESPIRATORY_TRACT | Status: DC
  Administered 2015-12-14 – 2015-12-15 (×2): 1 via RESPIRATORY_TRACT
  Filled 2015-12-13: qty 28

## 2015-12-13 MED ORDER — DONEPEZIL HCL 5 MG PO TABS
10.0000 mg | ORAL_TABLET | Freq: Every day | ORAL | Status: DC
  Administered 2015-12-14: 10 mg via ORAL
  Filled 2015-12-13 (×2): qty 2

## 2015-12-13 MED ORDER — DEXTROSE 5 % IV SOLN
250.0000 mg | INTRAVENOUS | Status: DC
  Administered 2015-12-14: 250 mg via INTRAVENOUS
  Filled 2015-12-13 (×2): qty 250

## 2015-12-13 MED ORDER — ALBUTEROL SULFATE (2.5 MG/3ML) 0.083% IN NEBU: 2.5000 mg | INHALATION_SOLUTION | Freq: Four times a day (QID) | RESPIRATORY_TRACT | Status: DC | PRN

## 2015-12-13 MED ORDER — ENSURE ENLIVE PO LIQD
237.0000 mL | Freq: Two times a day (BID) | ORAL | Status: DC
  Administered 2015-12-14 – 2015-12-15 (×3): 237 mL via ORAL

## 2015-12-13 MED ORDER — MIRTAZAPINE 15 MG PO TABS
15.0000 mg | ORAL_TABLET | Freq: Every day | ORAL | Status: DC
  Administered 2015-12-14: 15 mg via ORAL
  Filled 2015-12-13 (×2): qty 1

## 2015-12-13 MED ORDER — DOCUSATE SODIUM 100 MG PO CAPS: 300.0000 mg | ORAL_CAPSULE | Freq: Every day | ORAL | Status: DC | PRN

## 2015-12-13 MED ORDER — POLYETHYLENE GLYCOL 3350 17 G PO PACK: 17.0000 g | PACK | Freq: Every day | ORAL | Status: DC | PRN

## 2015-12-13 MED ORDER — TAMSULOSIN HCL 0.4 MG PO CAPS
0.8000 mg | ORAL_CAPSULE | Freq: Every day | ORAL | Status: DC
  Administered 2015-12-14: 0.8 mg via ORAL
  Filled 2015-12-13 (×2): qty 2

## 2015-12-13 MED ORDER — LATANOPROST 0.005 % OP SOLN
1.0000 [drp] | Freq: Every day | OPHTHALMIC | Status: DC
  Administered 2015-12-14: 1 [drp] via OPHTHALMIC
  Filled 2015-12-13: qty 2.5

## 2015-12-13 MED ORDER — SODIUM CHLORIDE 0.45 % IV SOLN
INTRAVENOUS | Status: DC
  Administered 2015-12-13 – 2015-12-15 (×3): via INTRAVENOUS

## 2015-12-13 MED ORDER — ASPIRIN 325 MG PO TABS
325.0000 mg | ORAL_TABLET | Freq: Every day | ORAL | Status: DC
  Administered 2015-12-14 – 2015-12-15 (×2): 325 mg via ORAL
  Filled 2015-12-13 (×3): qty 1

## 2015-12-13 MED ORDER — FERROUS SULFATE 325 (65 FE) MG PO TABS
325.0000 mg | ORAL_TABLET | Freq: Two times a day (BID) | ORAL | Status: DC
  Administered 2015-12-14 – 2015-12-15 (×3): 325 mg via ORAL
  Filled 2015-12-13 (×3): qty 1

## 2015-12-13 MED ORDER — MEMANTINE HCL 10 MG PO TABS
10.0000 mg | ORAL_TABLET | Freq: Two times a day (BID) | ORAL | Status: DC
  Administered 2015-12-14 – 2015-12-15 (×3): 10 mg via ORAL
  Filled 2015-12-13 (×4): qty 1

## 2015-12-13 MED ORDER — LUBIPROSTONE 24 MCG PO CAPS
24.0000 ug | ORAL_CAPSULE | Freq: Every day | ORAL | Status: DC
  Filled 2015-12-13: qty 1

## 2015-12-13 MED ORDER — CALCITRIOL 0.25 MCG PO CAPS
0.5000 ug | ORAL_CAPSULE | Freq: Every day | ORAL | Status: DC
  Administered 2015-12-14 – 2015-12-15 (×2): 0.5 ug via ORAL
  Filled 2015-12-13 (×2): qty 2

## 2015-12-13 MED ORDER — ATORVASTATIN CALCIUM 20 MG PO TABS
20.0000 mg | ORAL_TABLET | Freq: Every day | ORAL | Status: DC
  Administered 2015-12-14: 20 mg via ORAL
  Filled 2015-12-13 (×2): qty 1

## 2015-12-13 MED ORDER — SODIUM CHLORIDE 0.9 % IV BOLUS (SEPSIS)
1000.0000 mL | Freq: Once | INTRAVENOUS | Status: AC
  Administered 2015-12-13: 1000 mL via INTRAVENOUS

## 2015-12-13 MED ORDER — FINASTERIDE 5 MG PO TABS
5.0000 mg | ORAL_TABLET | Freq: Every day | ORAL | Status: DC
  Administered 2015-12-14 – 2015-12-15 (×2): 5 mg via ORAL
  Filled 2015-12-13 (×3): qty 1

## 2015-12-13 MED ORDER — ADULT MULTIVITAMIN W/MINERALS CH
1.0000 | ORAL_TABLET | Freq: Every day | ORAL | Status: DC
Start: 2015-12-14 — End: 2015-12-15
  Administered 2015-12-14 – 2015-12-15 (×2): 1 via ORAL
  Filled 2015-12-13 (×2): qty 1

## 2015-12-13 MED ORDER — METOPROLOL SUCCINATE ER 25 MG PO TB24
25.0000 mg | ORAL_TABLET | Freq: Every day | ORAL | Status: DC
  Administered 2015-12-14 – 2015-12-15 (×2): 25 mg via ORAL
  Filled 2015-12-13 (×2): qty 1

## 2015-12-13 MED ORDER — DEXTROSE 5 % IV SOLN
500.0000 mg | Freq: Once | INTRAVENOUS | Status: AC
  Administered 2015-12-13: 500 mg via INTRAVENOUS
  Filled 2015-12-13: qty 500

## 2015-12-13 NOTE — Progress Notes (Signed)
Pharmacy Antibiotic Note  [name] [name] is a 82 y.o. male admitted on 12/13/2015 with pneumonia.  Pharmacy has been consulted for rocephin dosing.  Plan: Rocephin 1 gm IV q24 hours F/u cultures and clinical course  Height: 5\' 7"  (170.2 cm) Weight: 174 lb (78.926 kg) IBW/kg (Calculated) : 66.1  Temp (24hrs), Avg:96 F (35.6 C), Min:96 F (35.6 C), Max:96 F (35.6 C)   Recent Labs Lab 12/13/15 1532  WBC 6.6  CREATININE 2.11*    Estimated Creatinine Clearance: 25.2 mL/min (by C-G formula based on Cr of 2.11).    No Known Allergies  Antimicrobials this admission: rocephin 5/23 >>   Thank you for allowing pharmacy to be a part of this patient's care.  Beverlee Nims 12/13/2015 5:21 PM

## 2015-12-13 NOTE — H&P (Signed)
[facility] History and Physical  MAYFIELD DEMICHAEL [id number] DOB: [birth year] [name] [name]  Referring physician: DR Regenia Skeeter PCP: Gara Kroner, MD   Chief Complaint: FTT, anorexia, ?dysphagia  HPI: [name] [name] is a 82 y.o. male with history of COPD, dementia, CAD/ hx CABG, HL, CKD 3, PVD, BPH and glaucoma.  Brought to ED by pts wife with hx of not eating > 1 week, very weak, hard to get up now.  Difficulty swallowing.  Was recently dx'd with thrush as complication of inhaled steroids and has been taking S&S for about 1 week, but still not eating.  Asked to see for FTT , dysphagia/ anoreixa.    Pt's wife says he also has been coughing up a lot of white phlegm for 2 wks, wheezing some.  No SOB.  No CP, no fevers and no abd pain . Chron constiapation. Takes Reglan for a "stomach condition".   They are married 13 yrs, second marriage for both.  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  No etoh / tob.  He has had dementia for" a while now", she cares for him at home.     ROS  denies CP  no joint pain   no HA  no blurry vision  no rash  no diarrhea  no nausea/ vomiting  no dysuria  no difficulty voiding  no change in urine color    Where does patient live home Can patient participate in ADLs? minimally  Past Medical History  Past Medical History  Diagnosis Date  . PVD (peripheral vascular disease) ([place])     s/p left renal artery stent and left iliac stent  . Hypertension   . Hyperlipemia   . GERD (gastroesophageal reflux disease)   . COPD (chronic obstructive pulmonary disease) ([place])   . Monoclonal gammopathy   . BPH (benign prostatic hyperplasia)   . Memory loss   . CAD (coronary artery disease)     s/p CABG in 1997 with known atretic LIMA to LAD and 40% LM.    Marland Kitchen Chronic kidney disease (CKD), stage III (moderate)   . Carotid artery stenosis     s/p left CEA.  Carotid dopplers 09/2013 60-79% right ICA  stenosis and 1-39% left ICA stenosis of left CEA  . Chronic chest wall pain   . Peptic ulcer disease   . Depression with anxiety   . Retinal artery occlusion   . On home oxygen therapy     at bedtime-2 L/m nasally..    . Neuromuscular disorder (H[facility])     fingertips numbness"  . DEMENTIA     "short term memory issues"  . Alzheimer disease   . Cough     cough with productive white to yellow sputum.  Marland Kitchen Anemia   . Glaucoma     bilateral   Past Surgical History  Past Surgical History  Procedure Laterality Date  . Left cea  2002    carotid stent-left-   . Cataract extraction w/phaco  09/18/2011    Procedure: CATARACT EXTRACTION PHACO AND INTRAOCULAR LENS PLACEMENT (IOC);  Surgeon: Elta Guadeloupe T. Gershon Crane, MD;  Location: [facility];  Service: Ophthalmology;  Laterality: Right;  CDE: 6.19  . Cardiac surgery    . Renal artery stent    . Coronary artery bypass graft  1997    "nerve pain at area"  . Back surgery  2011    lower back -disc  . Esophagogastroduodenoscopy (egd) with  propofol N/A 07/11/2015    Procedure: ESOPHAGOGASTRODUODENOSCOPY (EGD) WITH PROPOFOL;  Surgeon: Laurence Spates, MD;  Location: [facility];  Service: Endoscopy;  Laterality: N/A;  . Colonoscopy with propofol N/A 07/11/2015    Procedure: COLONOSCOPY WITH PROPOFOL;  Surgeon: Laurence Spates, MD;  Location: [facility] ENDOS[facility];  Service: Endoscopy;  Laterality: N/A;   Family History  Family History  Problem Relation Age of Onset  . Anesthesia problems Neg Hx   . Hypotension Neg Hx   . Malignant hyperthermia Neg Hx   . Pseudochol deficiency Neg Hx   . High blood pressure    . High Cholesterol    . Hypertension Father   . Hypertension Mother    Social History  reports that he quit smoking about 37 years ago. His smoking use included Cigarettes. He has a 20 pack-year smoking history. He has never used smokeless tobacco. He reports that he does not drink alcohol or use illicit drugs. Allergies No Known Allergies Home  medications Prior to Admission medications   Medication Sig Start Date End Date Taking? Authorizing Provider  acetaminophen (TYLENOL) 500 MG tablet Take 1,000 mg by mouth every 6 (six) hours as needed for headache.    Historical Provider, MD  albuterol (PROVENTIL) (2.5 MG/3ML) 0.083% nebulizer solution Take 2.5 mg by nebulization every 6 (six) hours as needed for wheezing or shortness of breath.    Historical Provider, MD  aspirin 325 MG tablet Take 325 mg by mouth daily.      Historical Provider, MD  atorvastatin (LIPITOR) 20 MG tablet Take 1 tablet (20 mg total) by mouth daily. 06/14/15   Sueanne Margarita, MD  benzonatate (TESSALON) 100 MG capsule Take 100-200 mg by mouth 3 (three) times daily as needed for cough. Reported on 12/02/2015    Historical Provider, MD  calcitRIOL (ROCALTROL) 0.5 MCG capsule Take 0.5 mcg by mouth daily. 07/29/14   Historical Provider, MD  cetirizine (ZYRTEC) 10 MG tablet Take 10 mg by mouth daily as needed for allergies.    Historical Provider, MD  Cholecalciferol (VITAMIN D3) 5000 UNITS TABS Take 5,000 Units by mouth every other day. Reported on 09/30/2015    Historical Provider, MD  docusate sodium (COLACE) 100 MG capsule Take 300 mg by mouth daily as needed for mild constipation. Reported on 09/30/2015    Historical Provider, MD  donepezil (ARICEPT) 10 MG tablet Take 1 tablet (10 mg total) by mouth at bedtime. 11/07/15   Dennie Bible, NP  feeding supplement, ENSURE ENLIVE, (ENSURE ENLIVE) LIQD Take 237 mLs by mouth 2 (two) times daily between meals. 10/16/14   Nishant Dhungel, MD  ferrous sulfate 325 (65 FE) MG tablet Take 325 mg by mouth 2 (two) times daily with a meal.    Historical Provider, MD  finasteride (PROSCAR) 5 MG tablet Take 5 mg by mouth daily.     Historical Provider, MD  fluticasone (FLONASE) 50 MCG/ACT nasal spray Place 2 sprays into both nostrils daily as needed for allergies.     Historical Provider, MD  fluticasone furoate-vilanterol (BREO ELLIPTA)  100-25 MCG/INH AEPB Inhale 1 puff into the lungs every morning. 11/07/15   Tanda Rockers, MD  gabapentin (NEURONTIN) 300 MG capsule Take 1 capsule (300 mg total) by mouth 3 (three) times daily. Patient taking differently: Take 300 mg by mouth 2 (two) times daily.  05/02/15   Dennie Bible, NP  guaiFENesin-dextromethorphan (ROBITUSSIN DM) 100-10 MG/5ML syrup Take 5 mLs by mouth every 4 (four) hours as needed for cough.  10/16/14   Nishant Dhungel, MD  ipratropium (ATROVENT) 0.06 % nasal spray Place 1 spray into both nostrils 4 (four) times daily.    Historical Provider, MD  lansoprazole (PREVACID) 30 MG capsule Take 1 capsule by mouth daily.    Historical Provider, MD  latanoprost (XALATAN) 0.005 % ophthalmic solution Place 1 drop into both eyes at bedtime.  12/28/13   Historical Provider, MD  lubiprostone (AMITIZA) 24 MCG capsule Take 24 mcg by mouth daily with breakfast.    Historical Provider, MD  memantine (NAMENDA) 10 MG tablet Take 1 tablet (10 mg total) by mouth 2 (two) times daily. 11/08/15   Dennie Bible, NP  metoCLOPramide (REGLAN) 10 MG tablet Take 10 mg by mouth 2 (two) times daily. Reported on 09/30/2015 11/01/14   Historical Provider, MD  metoprolol succinate (TOPROL-XL) 25 MG 24 hr tablet Take 1 tablet (25 mg total) by mouth daily. 08/04/15   Sueanne Margarita, MD  mirtazapine (REMERON) 30 MG tablet Take 30 mg by mouth at bedtime.    Historical Provider, MD  Multiple Vitamin (MULTIVITAMIN WITH MINERALS) TABS tablet Take 1 tablet by mouth daily.    Historical Provider, MD  nitroGLYCERIN (NITROSTAT) 0.4 MG SL tablet Place 1 tablet (0.4 mg total) under the tongue every 5 (five) minutes as needed for chest pain. 10/07/14   Sueanne Margarita, MD  ondansetron (ZOFRAN) 4 MG tablet Take 4 mg by mouth every 8 (eight) hours as needed for nausea or vomiting. Reported on 12/02/2015    Historical Provider, MD  OXYGEN 2lpm with sleep    Historical Provider, MD  polyethylene glycol (MIRALAX / GLYCOLAX)  packet Take 17 g by mouth daily as needed for moderate constipation.     Historical Provider, MD  Tamsulosin HCl (FLOMAX) 0.4 MG CAPS Take 0.8 mg by mouth at bedtime.     Historical Provider, MD  vitamin B-12 (CYANOCOBALAMIN) 1000 MCG tablet Take 1,000 mcg by mouth daily.    Historical Provider, MD   Liver Function Tests  Recent Labs Lab 12/13/15 1532  AST 26  ALT 21  ALKPHOS 57  BILITOT 0.4  PROT 8.5*  ALBUMIN 4.0   No results for input(s): LIPASE, AMYLASE in the last 168 hours. CBC  Recent Labs Lab 12/13/15 1532  WBC 6.6  NEUTROABS 3.0  HGB 12.3*  HCT 36.4*  MCV 100.3*  PLT [id number]   Basic Metabolic Panel  Recent Labs Lab 12/13/15 1532  NA 139  K 4.6  CL 104  CO2 26  GLUCOSE 86  BUN 26*  CREATININE 2.11*  CALCIUM 9.8     Filed Vitals:   12/13/15 1715 12/13/15 1745 12/13/15 1800 12/13/15 1830  BP:   172/69 171/70  Pulse: 53 56 59 55  Temp:      TempSrc:      Resp: 13 12 12 12   Height:      Weight:      SpO2: 95% 92% 92% 92%   Exam: VSS Gen pt is withdrawn, slow to talk and respond, oriented to "hospital" not to year No distress, well taken care of, chron ill No rash, cyanosis or gangrene Sclera anicteric, throat clear no signs of thrush  No jvd or bruits Chest clear bilat, good air movement , no wheezing RRR no MRG Abd soft ntnd no mass or ascites +bs GU normal male MS no joint effusions or deformity Ext no LE or UE edema / no wounds or ulcers Neuro is alert, Ox 1.5, nonfocal, psychomotor retardation,  no tremors   EKG (independently reviewed) > sinus bradycardia 56 bpm, no acute chg CXR (independently reviewed) > superimposed RML infiltrates o n background of chronic IS prominence, CM no CHF   7.45/ 37/ 65 ABG Na 139 Creat 2.11 CO2 26  BUN 26  K 4.6  Alb 4.0  Trop < 0.03 WBC 6k  Hb 12   plt 199   Assessment: 1. Failure-to-thrive 2. Advanced dementia 3. Possible PNA - cough/ R sided infiltrates 4. Anorexia/ poss dysphagia - hx recent  thrush  5. COPD - not symptomatic at this time 6. Hx CABG 7. HTN 8.  HL 9. Recent thrush  Plan - IV abx for poss CAP, see if he responds.  If not eating ask GI to see. IVF"s.  Usual meds      Sol Blazing [facility] Pager [phone]  Cell [phone]  If 7PM-7AM, please contact night-coverage [email or web address] Password [facility] [name], 6:51 PM

## 2015-12-13 NOTE — ED Notes (Addendum)
Family member states patient has had difficulty swallowing x 1 week. States he has been able to swallow "but it's just hard for him." States he did not eat breakfast but did drink water and was able to keep it down this morning.

## 2015-12-13 NOTE — ED Notes (Signed)
MD at bedside. 

## 2015-12-13 NOTE — ED Provider Notes (Signed)
CSN: [id number]     Arrival date & time 12/13/15  1301 History   First MD Initiated Contact with Patient 12/13/15 1503     Chief Complaint  Patient presents with  . Dysphagia     (Consider location/radiation/quality/duration/timing/severity/associated sxs/prior Treatment) HPI  82 year old male with a history of memory loss, chronic chest wall pain, chronic kidney disease, COPD, and GERD presents with trouble swallowing. The history is taken from the wife at the bedside. Patient has been having trouble swallowing for at least a couple weeks. It seems to worse over the last few days. He has been diagnosed with thrush and has been on nystatin for the past 1 week. He appears to have white lesions on his tongue that seem to wax and wane. The tongue also looks bigger to the wife. The patient can swallow but it takes him quite some time. However he is able to swallow both liquids and some foods. He has chronic troubles emptying his stomach for which she is on Reglan and sees Dr. Oletta Lamas. No vomiting but it seems like the patient coughs saliva that's in his throat. He has been short of breath for a week as well. The wife and patient endorses that he has chest pain but the patient is not a little to explain how long or what it feels like. Wife states this is just like what his chest pain feels like from his acid reflux. She was most concerned because he seemed to have more trouble swallowing today. Patient has been feeling cold and having chills over last few days.  Past Medical History  Diagnosis Date  . PVD (peripheral vascular disease) ([place])     s/p left renal artery stent and left iliac stent  . Hypertension   . Hyperlipemia   . GERD (gastroesophageal reflux disease)   . COPD (chronic obstructive pulmonary disease) ([place])   . Monoclonal gammopathy   . BPH (benign prostatic hyperplasia)   . Memory loss   . CAD (coronary artery disease)     s/p CABG in 1997 with known atretic LIMA to LAD and 40% LM.     Marland Kitchen Chronic kidney disease (CKD), stage III (moderate)   . Carotid artery stenosis     s/p left CEA.  Carotid dopplers 09/2013 60-79% right ICA stenosis and 1-39% left ICA stenosis of left CEA  . Chronic chest wall pain   . Peptic ulcer disease   . Depression with anxiety   . Retinal artery occlusion   . On home oxygen therapy     at bedtime-2 L/m nasally..    . Neuromuscular disorder (HCC)     fingertips numbness"  . DEMENTIA     "short term memory issues"  . Alzheimer disease   . Cough     cough with productive white to yellow sputum.  Marland Kitchen Anemia   . Glaucoma     bilateral   Past Surgical History  Procedure Laterality Date  . Left cea  2002    carotid stent-left-   . Cataract extraction w/phaco  09/18/2011    Procedure: CATARACT EXTRACTION PHACO AND INTRAOCULAR LENS PLACEMENT (IOC);  Surgeon: Elta Guadeloupe T. Gershon Crane, MD;  Location: [facility];  Service: Ophthalmology;  Laterality: Right;  CDE: 6.19  . Cardiac surgery    . Renal artery stent    . Coronary artery bypass graft  1997    "nerve pain at area"  . Back surgery  2011    lower back -disc  . Esophagogastroduodenoscopy (egd) with  propofol N/A 07/11/2015    Procedure: ESOPHAGOGASTRODUODENOSCOPY (EGD) WITH PROPOFOL;  Surgeon: Laurence Spates, MD;  Location: [facility];  Service: Endoscopy;  Laterality: N/A;  . Colonoscopy with propofol N/A 07/11/2015    Procedure: COLONOSCOPY WITH PROPOFOL;  Surgeon: Laurence Spates, MD;  Location: [facility];  Service: Endoscopy;  Laterality: N/A;   Family History  Problem Relation Age of Onset  . Anesthesia problems Neg Hx   . Hypotension Neg Hx   . Malignant hyperthermia Neg Hx   . Pseudochol deficiency Neg Hx   . High blood pressure    . High Cholesterol    . Hypertension Father   . Hypertension Mother    Social History  Substance Use Topics  . Smoking status: Former Smoker -- 0.50 packs/day for 40 years    Types: Cigarettes    Quit date: 07/23/1978  . Smokeless tobacco: Never Used  .  Alcohol Use: No    Review of Systems  Unable to perform ROS: Dementia      Allergies  Review of patient's allergies indicates no known allergies.  Home Medications   Prior to Admission medications   Medication Sig Start Date End Date Taking? Authorizing Provider  acetaminophen (TYLENOL) 500 MG tablet Take 1,000 mg by mouth every 6 (six) hours as needed for headache.    Historical Provider, MD  albuterol (PROVENTIL) (2.5 MG/3ML) 0.083% nebulizer solution Take 2.5 mg by nebulization every 6 (six) hours as needed for wheezing or shortness of breath.    Historical Provider, MD  aspirin 325 MG tablet Take 325 mg by mouth daily.      Historical Provider, MD  atorvastatin (LIPITOR) 20 MG tablet Take 1 tablet (20 mg total) by mouth daily. 06/14/15   Sueanne Margarita, MD  benzonatate (TESSALON) 100 MG capsule Take 100-200 mg by mouth 3 (three) times daily as needed for cough. Reported on 12/02/2015    Historical Provider, MD  calcitRIOL (ROCALTROL) 0.5 MCG capsule Take 0.5 mcg by mouth daily. 07/29/14   Historical Provider, MD  cetirizine (ZYRTEC) 10 MG tablet Take 10 mg by mouth daily as needed for allergies.    Historical Provider, MD  Cholecalciferol (VITAMIN D3) 5000 UNITS TABS Take 5,000 Units by mouth every other day. Reported on 09/30/2015    Historical Provider, MD  docusate sodium (COLACE) 100 MG capsule Take 300 mg by mouth daily as needed for mild constipation. Reported on 09/30/2015    Historical Provider, MD  donepezil (ARICEPT) 10 MG tablet Take 1 tablet (10 mg total) by mouth at bedtime. 11/07/15   Dennie Bible, NP  feeding supplement, ENSURE ENLIVE, (ENSURE ENLIVE) LIQD Take 237 mLs by mouth 2 (two) times daily between meals. 10/16/14   Nishant Dhungel, MD  ferrous sulfate 325 (65 FE) MG tablet Take 325 mg by mouth 2 (two) times daily with a meal.    Historical Provider, MD  finasteride (PROSCAR) 5 MG tablet Take 5 mg by mouth daily.     Historical Provider, MD  fluticasone  (FLONASE) 50 MCG/ACT nasal spray Place 2 sprays into both nostrils daily as needed for allergies.     Historical Provider, MD  fluticasone furoate-vilanterol (BREO ELLIPTA) 100-25 MCG/INH AEPB Inhale 1 puff into the lungs every morning. 11/07/15   Tanda Rockers, MD  gabapentin (NEURONTIN) 300 MG capsule Take 1 capsule (300 mg total) by mouth 3 (three) times daily. Patient taking differently: Take 300 mg by mouth 2 (two) times daily.  05/02/15   Dennie Bible,  NP  guaiFENesin-dextromethorphan (ROBITUSSIN DM) 100-10 MG/5ML syrup Take 5 mLs by mouth every 4 (four) hours as needed for cough. 10/16/14   Nishant Dhungel, MD  ipratropium (ATROVENT) 0.06 % nasal spray Place 1 spray into both nostrils 4 (four) times daily.    Historical Provider, MD  lansoprazole (PREVACID) 30 MG capsule Take 1 capsule by mouth daily.    Historical Provider, MD  latanoprost (XALATAN) 0.005 % ophthalmic solution Place 1 drop into both eyes at bedtime.  12/28/13   Historical Provider, MD  lubiprostone (AMITIZA) 24 MCG capsule Take 24 mcg by mouth daily with breakfast.    Historical Provider, MD  memantine (NAMENDA) 10 MG tablet Take 1 tablet (10 mg total) by mouth 2 (two) times daily. 11/08/15   Dennie Bible, NP  metoCLOPramide (REGLAN) 10 MG tablet Take 10 mg by mouth 2 (two) times daily. Reported on 09/30/2015 11/01/14   Historical Provider, MD  metoprolol succinate (TOPROL-XL) 25 MG 24 hr tablet Take 1 tablet (25 mg total) by mouth daily. 08/04/15   Sueanne Margarita, MD  mirtazapine (REMERON) 30 MG tablet Take 30 mg by mouth at bedtime.    Historical Provider, MD  Multiple Vitamin (MULTIVITAMIN WITH MINERALS) TABS tablet Take 1 tablet by mouth daily.    Historical Provider, MD  nitroGLYCERIN (NITROSTAT) 0.4 MG SL tablet Place 1 tablet (0.4 mg total) under the tongue every 5 (five) minutes as needed for chest pain. 10/07/14   Sueanne Margarita, MD  ondansetron (ZOFRAN) 4 MG tablet Take 4 mg by mouth every 8 (eight) hours as  needed for nausea or vomiting. Reported on 12/02/2015    Historical Provider, MD  OXYGEN 2lpm with sleep    Historical Provider, MD  polyethylene glycol (MIRALAX / GLYCOLAX) packet Take 17 g by mouth daily as needed for moderate constipation.     Historical Provider, MD  Tamsulosin HCl (FLOMAX) 0.4 MG CAPS Take 0.8 mg by mouth at bedtime.     Historical Provider, MD  vitamin B-12 (CYANOCOBALAMIN) 1000 MCG tablet Take 1,000 mcg by mouth daily.    Historical Provider, MD   BP 142/66 mmHg  Pulse 60  Temp(Src) 96 F (35.6 C) (Temporal)  Resp 14  Ht 5\' 7"  (1.702 m)  Wt 174 lb (78.926 kg)  BMI 27.25 kg/m2  SpO2 95% Physical Exam  Constitutional: He is oriented to person, place, and time. He appears well-developed and well-nourished. No distress.  HENT:  Head: Normocephalic and atraumatic.  Right Ear: External ear normal.  Left Ear: External ear normal.  Nose: Nose normal.  Mouth/Throat: Oropharynx is clear and moist.  Very mild yellow lesions to tongue c/w thrush. Difficult to fully see in posterior oropharynx but no obvious lesions or swelling  Eyes: Right eye exhibits no discharge. Left eye exhibits no discharge.  Neck: Neck supple.  Cardiovascular: Normal rate, regular rhythm, normal heart sounds and intact distal pulses.   Pulmonary/Chest: Effort normal and breath sounds normal. He has no wheezes.  Abdominal: Soft. There is no tenderness.  Musculoskeletal: He exhibits no edema.  Neurological: He is alert and oriented to person, place, and time.  Skin: Skin is warm and dry. He is not diaphoretic.  Nursing note and vitals reviewed.   ED Course  Procedures (including critical care time) Labs Review Labs Reviewed  COMPREHENSIVE METABOLIC PANEL - Abnormal; Notable for the following:    BUN 26 (*)    Creatinine, Ser 2.11 (*)    Total Protein 8.5 (*)  GFR calc non Af Amer 28 (*)    GFR calc Af Amer 32 (*)    All other components within normal limits  CBC WITH  DIFFERENTIAL/PLATELET - Abnormal; Notable for the following:    RBC 3.63 (*)    Hemoglobin 12.3 (*)    HCT 36.4 (*)    MCV 100.3 (*)    All other components within normal limits  BLOOD GAS, ARTERIAL - Abnormal; Notable for the following:    pO2, Arterial 64.9 (*)    Acid-base deficit 4.7 (*)    All other components within normal limits  CULTURE, BLOOD (ROUTINE X 2)  CULTURE, BLOOD (ROUTINE X 2)  CULTURE, EXPECTORATED SPUTUM-ASSESSMENT  GRAM STAIN  TROPONIN I  HIV ANTIBODY (ROUTINE TESTING)  STREP PNEUMONIAE URINARY ANTIGEN    Imaging Review Dg Chest 2 View  12/13/2015  CLINICAL DATA:  Cough and difficulty swallowing. EXAM: CHEST  2 VIEW COMPARISON:  Two-view chest x-ray 08/25/2015. FINDINGS: The heart is enlarged. Median sternotomy for CABG is noted. Diffuse interstitial airspace disease is again noted. There is increased opacity over the right middle lobe and a probable right sided effusion. Degenerative changes of the thoracic spine are stable. IMPRESSION: 1. Superimposed right middle lobe airspace disease on chronic interstitial coarsening worrisome for acute pneumonia. 2. Cardiomegaly without failure. Electronically Signed   By: San Morelle M.D.   On: 12/13/2015 16:13   I have personally reviewed and evaluated these images and lab results as part of my medical decision-making.   EKG Interpretation   Date/Time:  Tuesday Dec 13 2015 16:26:59 EDT Ventricular Rate:  54 PR Interval:  159 QRS Duration: 105 QT Interval:  445 QTC Calculation: 422 R Axis:   60 Text Interpretation:  Sinus rhythm Low voltage, precordial leads Baseline  wander in lead(s) III no significant change since Mar 2016 Confirmed by  Regenia Skeeter MD, [place] [phone]) on 12/13/2015 4:41:23 PM      MDM   Final diagnoses:  Community acquired pneumonia    Patient with PNA. He is not in distress but family feels he is more confused/lethargic. Resting O2 sats 89-90%. Has mild bump in Cr from chronic  baseline. His CURB 65 is 3, thus will admit for antibiotics and supportive care. Will need GI consult or speech consult for his trouble swallowing.     Sherwood Gambler, MD 12/14/15 1227

## 2015-12-13 NOTE — ED Notes (Signed)
report received from [place], [place]

## 2015-12-14 DIAGNOSIS — R627 Adult failure to thrive: Secondary | ICD-10-CM | POA: Diagnosis present

## 2015-12-14 DIAGNOSIS — R1310 Dysphagia, unspecified: Secondary | ICD-10-CM | POA: Diagnosis present

## 2015-12-14 DIAGNOSIS — N183 Chronic kidney disease, stage 3 (moderate): Secondary | ICD-10-CM | POA: Diagnosis present

## 2015-12-14 DIAGNOSIS — I739 Peripheral vascular disease, unspecified: Secondary | ICD-10-CM | POA: Diagnosis present

## 2015-12-14 DIAGNOSIS — F418 Other specified anxiety disorders: Secondary | ICD-10-CM | POA: Diagnosis present

## 2015-12-14 DIAGNOSIS — K219 Gastro-esophageal reflux disease without esophagitis: Secondary | ICD-10-CM | POA: Diagnosis present

## 2015-12-14 DIAGNOSIS — I10 Essential (primary) hypertension: Secondary | ICD-10-CM

## 2015-12-14 DIAGNOSIS — Z95828 Presence of other vascular implants and grafts: Secondary | ICD-10-CM | POA: Diagnosis not present

## 2015-12-14 DIAGNOSIS — Z9981 Dependence on supplemental oxygen: Secondary | ICD-10-CM | POA: Diagnosis not present

## 2015-12-14 DIAGNOSIS — N400 Enlarged prostate without lower urinary tract symptoms: Secondary | ICD-10-CM | POA: Diagnosis present

## 2015-12-14 DIAGNOSIS — J189 Pneumonia, unspecified organism: Secondary | ICD-10-CM | POA: Diagnosis present

## 2015-12-14 DIAGNOSIS — Z951 Presence of aortocoronary bypass graft: Secondary | ICD-10-CM | POA: Diagnosis not present

## 2015-12-14 DIAGNOSIS — H409 Unspecified glaucoma: Secondary | ICD-10-CM | POA: Diagnosis present

## 2015-12-14 DIAGNOSIS — I129 Hypertensive chronic kidney disease with stage 1 through stage 4 chronic kidney disease, or unspecified chronic kidney disease: Secondary | ICD-10-CM | POA: Diagnosis present

## 2015-12-14 DIAGNOSIS — R630 Anorexia: Secondary | ICD-10-CM | POA: Diagnosis present

## 2015-12-14 DIAGNOSIS — G309 Alzheimer's disease, unspecified: Secondary | ICD-10-CM | POA: Diagnosis present

## 2015-12-14 DIAGNOSIS — E785 Hyperlipidemia, unspecified: Secondary | ICD-10-CM | POA: Diagnosis present

## 2015-12-14 DIAGNOSIS — Z87891 Personal history of nicotine dependence: Secondary | ICD-10-CM | POA: Diagnosis not present

## 2015-12-14 DIAGNOSIS — J449 Chronic obstructive pulmonary disease, unspecified: Secondary | ICD-10-CM | POA: Diagnosis not present

## 2015-12-14 DIAGNOSIS — F0280 Dementia in other diseases classified elsewhere without behavioral disturbance: Secondary | ICD-10-CM | POA: Diagnosis present

## 2015-12-14 DIAGNOSIS — Z8619 Personal history of other infectious and parasitic diseases: Secondary | ICD-10-CM

## 2015-12-14 DIAGNOSIS — F0390 Unspecified dementia without behavioral disturbance: Secondary | ICD-10-CM | POA: Diagnosis not present

## 2015-12-14 DIAGNOSIS — J440 Chronic obstructive pulmonary disease with acute lower respiratory infection: Secondary | ICD-10-CM | POA: Diagnosis present

## 2015-12-14 DIAGNOSIS — I2510 Atherosclerotic heart disease of native coronary artery without angina pectoris: Secondary | ICD-10-CM | POA: Diagnosis present

## 2015-12-14 DIAGNOSIS — Z7982 Long term (current) use of aspirin: Secondary | ICD-10-CM | POA: Diagnosis not present

## 2015-12-14 DIAGNOSIS — Z6827 Body mass index (BMI) 27.0-27.9, adult: Secondary | ICD-10-CM | POA: Diagnosis not present

## 2015-12-14 MED ORDER — PANTOPRAZOLE SODIUM 40 MG PO TBEC
40.0000 mg | DELAYED_RELEASE_TABLET | Freq: Every day | ORAL | Status: DC
  Administered 2015-12-14 – 2015-12-15 (×2): 40 mg via ORAL
  Filled 2015-12-14 (×2): qty 1

## 2015-12-14 MED ORDER — ACETAMINOPHEN 500 MG PO TABS
500.0000 mg | ORAL_TABLET | Freq: Once | ORAL | Status: AC
  Administered 2015-12-14: 500 mg via ORAL
  Filled 2015-12-14: qty 1

## 2015-12-14 MED ORDER — LUBIPROSTONE 24 MCG PO CAPS
24.0000 ug | ORAL_CAPSULE | Freq: Every day | ORAL | Status: DC
  Administered 2015-12-14: 24 ug via ORAL
  Filled 2015-12-14: qty 1

## 2015-12-14 NOTE — Evaluation (Signed)
Clinical/Bedside Swallow Evaluation Patient Details  Name: [name] [name] MRN: [id number] Date of Birth: 1931/06/12  Today's Date: 12/14/2015 Time: SLP Start Time (ACUTE ONLY): 1430 SLP Stop Time (ACUTE ONLY): 1455 SLP Time Calculation (min) (ACUTE ONLY): 25 min  Past Medical History:  Past Medical History  Diagnosis Date  . PVD (peripheral vascular disease) ([place])     s/p left renal artery stent and left iliac stent  . Hypertension   . Hyperlipemia   . GERD (gastroesophageal reflux disease)   . COPD (chronic obstructive pulmonary disease) ([place])   . Monoclonal gammopathy   . BPH (benign prostatic hyperplasia)   . Memory loss   . CAD (coronary artery disease)     s/p CABG in 1997 with known atretic LIMA to LAD and 40% LM.    Marland Kitchen Chronic kidney disease (CKD), stage III (moderate)   . Carotid artery stenosis     s/p left CEA.  Carotid dopplers 09/2013 60-79% right ICA stenosis and 1-39% left ICA stenosis of left CEA  . Chronic chest wall pain   . Peptic ulcer disease   . Depression with anxiety   . Retinal artery occlusion   . On home oxygen therapy     at bedtime-2 L/m nasally..    . Neuromuscular disorder (HCC)     fingertips numbness"  . DEMENTIA     "short term memory issues"  . Alzheimer disease   . Cough     cough with productive white to yellow sputum.  Marland Kitchen Anemia   . Glaucoma     bilateral   Past Surgical History:  Past Surgical History  Procedure Laterality Date  . Left cea  2002    carotid stent-left-   . Cataract extraction w/phaco  09/18/2011    Procedure: CATARACT EXTRACTION PHACO AND INTRAOCULAR LENS PLACEMENT (IOC);  Surgeon: Elta Guadeloupe T. Gershon Crane, MD;  Location: [facility];  Service: Ophthalmology;  Laterality: Right;  CDE: 6.19  . Cardiac surgery    . Renal artery stent    . Coronary artery bypass graft  1997    "nerve pain at area"  . Back surgery  2011    lower back -disc  . Esophagogastroduodenoscopy (egd) with propofol N/A 07/11/2015    Procedure:  ESOPHAGOGASTRODUODENOSCOPY (EGD) WITH PROPOFOL;  Surgeon: Laurence Spates, MD;  Location: [facility];  Service: Endoscopy;  Laterality: N/A;  . Colonoscopy with propofol N/A 07/11/2015    Procedure: COLONOSCOPY WITH PROPOFOL;  Surgeon: Laurence Spates, MD;  Location: [facility];  Service: Endoscopy;  Laterality: N/A;   HPI:  82 y.o. male with history of COPD, dementia, CAD/ hx CABG, HL, CKD 3, PVD, BPH and glaucoma. Brought to ED by pts wife with hx of not eating > 1 week, very weak, hard to get up now. Difficulty swallowing reported  Chest x-ray shows: Superimposed right middle lobe airspace disease on chronic interstitial coarsening worrisome for acute pneumonia.  Assessment / Plan / Recommendation Clinical Impression  [name] was assessed at bedside for clinical swallow evaluation. Pt was alert and cooperative, pleasantly confused. Oral motor examination was unremarkable except for being slow to respond and mild lingual coating. Pt able to self feed water via cup and straw and graham crackers without overt signs or symptoms of aspiration. Again, pt with mild lingual coating after graham cracker, but clears with liquid wash. Recommend regular textures and thin liquids when pt is alert and upright. Standard aspiration and reflux precautions. SLP will f/u x1 for diet tolerance.  Aspiration Risk  Mild aspiration risk    Diet Recommendation Regular;Thin liquid   Liquid Administration via: Cup;Straw Medication Administration: Whole meds with liquid Supervision: Patient able to self feed;Intermittent supervision to cue for compensatory strategies Compensations: Slow rate;Small sips/bites;Follow solids with liquid Postural Changes: Seated upright at 90 degrees;Remain upright for at least 30 minutes after po intake    Other  Recommendations Oral Care Recommendations: Oral care BID;Staff/trained caregiver to provide oral care Other Recommendations: Clarify dietary restrictions   Follow  up Recommendations  None    Frequency and Duration min 1 x/week  1 week       Prognosis Prognosis for Safe Diet Advancement: Good Barriers to Reach Goals: Cognitive deficits      Swallow Study   General Date of Onset: 12/13/15 HPI: 82 y.o. male with history of COPD, dementia, CAD/ hx CABG, HL, CKD 3, PVD, BPH and glaucoma. Brought to ED by pts wife with hx of not eating > 1 week, very weak, hard to get up now. Difficulty swallowing reported Type of Study: Bedside Swallow Evaluation Previous Swallow Assessment: none on record Diet Prior to this Study:  (soft, thin) Temperature Spikes Noted: No Respiratory Status: Room air History of Recent Intubation: No Behavior/Cognition: Alert;Cooperative;Pleasant mood;Requires cueing Oral Cavity Assessment: Within Functional Limits (mild coating on tongue) Oral Care Completed by SLP: Yes Oral Cavity - Dentition: Dentures, top;Dentures, bottom Vision: Functional for self-feeding Self-Feeding Abilities: Able to feed self Patient Positioning: Upright in bed Baseline Vocal Quality: Normal;Low vocal intensity Volitional Cough: Strong Volitional Swallow: Able to elicit    Oral/Motor/Sensory Function Overall Oral Motor/Sensory Function: Within functional limits   Ice Chips Ice chips: Within functional limits Presentation: Spoon   Thin Liquid Thin Liquid: Within functional limits Presentation: Cup;Self Fed;Spoon;Straw    Nectar Thick Nectar Thick Liquid: Not tested   Honey Thick Honey Thick Liquid: Not tested   Puree Puree: Within functional limits Presentation: Spoon   Solid     Solid: Within functional limits Presentation: Self Fed Other Comments:  (mild ligual coating, clears with liquid wash)       Thank you,  Genene Churn, [place]  [name] [name] 12/14/2015,3:12 PM

## 2015-12-14 NOTE — Progress Notes (Signed)
PROGRESS NOTE    [name] [name]  [id number] DOB: 1930/09/04 DOA: 12/13/2015 PCP: Gara Kroner, MD    Brief Narrative:  82 y.o. male with history of COPD, dementia, CAD/ hx CABG, HL, CKD 3, PVD, BPH and glaucoma. Brought to ED by pts wife with hx of not eating > 1 week, very weak, hard to get up now. Difficulty swallowing reported   Assessment & Plan:   Principal Problem:   CAP (community acquired pneumonia) Active Problems:   CKD (chronic kidney disease) stage 3, GFR 30-59 ml/min   COPD GOLD II   Dysphagia   Anorexia   History of thrush   Hypertension, essential   Dementia   1. Failure-to-thrive 1. Suspect secondary to advanced dementia 2. Continue to encourage by mouth intake as tolerated 2. Advanced dementia 1. Family reports mental status is nearing his baseline confusion 2. Continue with supportive care 3. Possible PNA  1. cough/ R sided infiltrates 2. Continue azithromycin and Rocephin as tolerated. 4. Anorexia/ poss dysphagia  1. hx recent thrush 2. Have consulted SLP for evaluation 5. COPD - not symptomatic at this time 1. No wheezing on exam. 2. Continue supportive care 6. Hx CABG 1. Seems stable at present. 7. HTN 1. Blood pressure currently stable, albeit suboptimally controlled 2. Continue to monitor and titrate blood pressure medications as needed 8. HL 1. Stable 9. Recent thrush   DVT prophylaxis: Lovenox subcutaneous Code Status: Full Family Communication: Patient in room, wife at bedside Disposition Plan: Possible DC home in 24 hours if improved   Consultants:     Procedures:     Antimicrobials:  Anti-infectives    Start     Dose/Rate Route Frequency Ordered Stop   12/14/15 2000  azithromycin (ZITHROMAX) 250 mg in dextrose 5 % 125 mL IVPB     250 mg 125 mL/hr over 60 Minutes Intravenous Every 24 hours 12/13/15 2025 12/18/15 1959   12/13/15 1730  cefTRIAXone (ROCEPHIN) 1 g in dextrose 5 % 50 mL IVPB     1 g 100 mL/hr over  30 Minutes Intravenous Every 24 hours 12/13/15 1721     12/13/15 1715  azithromycin (ZITHROMAX) 500 mg in dextrose 5 % 250 mL IVPB     500 mg 250 mL/hr over 60 Minutes Intravenous  Once 12/13/15 1712 12/13/15 1941          Subjective: Patient reports feeling better. Wife at bedside and agrees patient seems improved  Objective: Filed Vitals:   12/13/15 1900 12/13/15 1928 12/13/15 1931 12/14/15 1033  BP: 177/77 177/77    Pulse: 58 57    Temp:   97.5 F (36.4 C)   TempSrc:   Oral   Resp: 13 16    Height:      Weight:      SpO2: 94% 91%  93%    Intake/Output Summary (Last 24 hours) at 12/14/15 1348 Last data filed at 12/14/15 1045  Gross per 24 hour  Intake    995 ml  Output      0 ml  Net    995 ml   Filed Weights   12/13/15 1305  Weight: 78.926 kg (174 lb)    Examination:  General exam: Appears calm and comfortable, Baseline confused oriented 2 (place and person)  Respiratory system: Clear to auscultation. Respiratory effort normal. Cardiovascular system: S1 & S2 heard, RRR. No pedal edema. Gastrointestinal system: Abdomen is nondistended, soft and nontender. No organomegaly or masses felt. Normal bowel sounds heard. Central nervous  system: Alert, No focal neurological deficits. Extremities: Symmetric 5 x 5 power. Skin: No rashes, lesions Psychiatry: Judgement and insight appear normal. Mood & affect appropriate.     Data Reviewed: I have personally reviewed following labs and imaging studies  CBC:  Recent Labs Lab 12/13/15 1532  WBC 6.6  NEUTROABS 3.0  HGB 12.3*  HCT 36.4*  MCV 100.3*  PLT 123XX123   Basic Metabolic Panel:  Recent Labs Lab 12/13/15 1532  NA 139  K 4.6  CL 104  CO2 26  GLUCOSE 86  BUN 26*  CREATININE 2.11*  CALCIUM 9.8   GFR: Estimated Creatinine Clearance: 25.2 mL/min (by C-G formula based on Cr of 2.11). Liver Function Tests:  Recent Labs Lab 12/13/15 1532  AST 26  ALT 21  ALKPHOS 57  BILITOT 0.4  PROT 8.5*    ALBUMIN 4.0   No results for input(s): LIPASE, AMYLASE in the last 168 hours. No results for input(s): AMMONIA in the last 168 hours. Coagulation Profile: No results for input(s): INR, PROTIME in the last 168 hours. Cardiac Enzymes:  Recent Labs Lab 12/13/15 1532  TROPONINI <0.03   BNP (last 3 results) No results for input(s): PROBNP in the last 8760 hours. HbA1C: No results for input(s): HGBA1C in the last 72 hours. CBG: No results for input(s): GLUCAP in the last 168 hours. Lipid Profile: No results for input(s): CHOL, HDL, LDLCALC, TRIG, CHOLHDL, LDLDIRECT in the last 72 hours. Thyroid Function Tests: No results for input(s): TSH, T4TOTAL, FREET4, T3FREE, THYROIDAB in the last 72 hours. Anemia Panel: No results for input(s): VITAMINB12, FOLATE, FERRITIN, TIBC, IRON, RETICCTPCT in the last 72 hours. Sepsis Labs: No results for input(s): PROCALCITON, LATICACIDVEN in the last 168 hours.  Recent Results (from the past 240 hour(s))  Blood culture (routine x 2)     Status: None (Preliminary result)   Collection Time: 12/13/15  5:22 PM  Result Value Ref Range Status   Specimen Description BLOOD RIGHT HAND  Final   Special Requests BOTTLES DRAWN AEROBIC ONLY 4CC  Final   Culture NO GROWTH < 24 HOURS  Final   Report Status PENDING  Incomplete  Blood culture (routine x 2)     Status: None (Preliminary result)   Collection Time: 12/13/15  5:44 PM  Result Value Ref Range Status   Specimen Description BLOOD LEFT HAND  Final   Special Requests BOTTLES DRAWN AEROBIC AND ANAEROBIC 6CC EACH  Final   Culture NO GROWTH < 24 HOURS  Final   Report Status PENDING  Incomplete         Radiology Studies: Dg Chest 2 View  12/13/2015  CLINICAL DATA:  Cough and difficulty swallowing. EXAM: CHEST  2 VIEW COMPARISON:  Two-view chest x-ray 08/25/2015. FINDINGS: The heart is enlarged. Median sternotomy for CABG is noted. Diffuse interstitial airspace disease is again noted. There is increased  opacity over the right middle lobe and a probable right sided effusion. Degenerative changes of the thoracic spine are stable. IMPRESSION: 1. Superimposed right middle lobe airspace disease on chronic interstitial coarsening worrisome for acute pneumonia. 2. Cardiomegaly without failure. Electronically Signed   By: San Morelle M.D.   On: 12/13/2015 16:13        Scheduled Meds: . aspirin  325 mg Oral Daily  . atorvastatin  20 mg Oral q1800  . azithromycin  250 mg Intravenous Q24H  . calcitRIOL  0.5 mcg Oral Daily  . cefTRIAXone (ROCEPHIN)  IV  1 g Intravenous Q24H  .  donepezil  10 mg Oral QHS  . enoxaparin (LOVENOX) injection  30 mg Subcutaneous Q24H  . feeding supplement (ENSURE ENLIVE)  237 mL Oral BID BM  . ferrous sulfate  325 mg Oral BID WC  . finasteride  5 mg Oral Daily  . fluticasone furoate-vilanterol  1 puff Inhalation q morning - 10a  . latanoprost  1 drop Both Eyes QHS  . lubiprostone  24 mcg Oral QHS  . memantine  10 mg Oral BID  . metoCLOPramide  10 mg Oral BID  . metoprolol succinate  25 mg Oral Daily  . mirtazapine  15 mg Oral QHS  . multivitamin with minerals  1 tablet Oral Daily  . pantoprazole  40 mg Oral Daily  . tamsulosin  0.8 mg Oral QHS   Continuous Infusions: . sodium chloride 75 mL/hr at 12/13/15 2212      Berwyn Bigley, Orpah Melter, MD [facility] Pager [phone]  If 7PM-7AM, please contact night-coverage [email or web address] Password [email or web address] 12/14/2015, 1:48 PM

## 2015-12-15 ENCOUNTER — Ambulatory Visit: Payer: Commercial Managed Care - HMO | Admitting: Adult Health

## 2015-12-15 ENCOUNTER — Telehealth: Payer: Self-pay | Admitting: Cardiology

## 2015-12-15 ENCOUNTER — Telehealth: Payer: Self-pay | Admitting: Adult Health

## 2015-12-15 DIAGNOSIS — N183 Chronic kidney disease, stage 3 (moderate): Secondary | ICD-10-CM

## 2015-12-15 DIAGNOSIS — E785 Hyperlipidemia, unspecified: Secondary | ICD-10-CM

## 2015-12-15 DIAGNOSIS — F0390 Unspecified dementia without behavioral disturbance: Secondary | ICD-10-CM

## 2015-12-15 LAB — BASIC METABOLIC PANEL
ANION GAP: 6 (ref 5–15)
BUN: 20 mg/dL (ref 6–20)
CHLORIDE: 110 mmol/L (ref 101–111)
CO2: 21 mmol/L — ABNORMAL LOW (ref 22–32)
Calcium: 9.3 mg/dL (ref 8.9–10.3)
Creatinine, Ser: 1.73 mg/dL — ABNORMAL HIGH (ref 0.61–1.24)
GFR calc Af Amer: 41 mL/min — ABNORMAL LOW (ref >60)
GFR, EST NON AFRICAN AMERICAN: 35 mL/min — AB (ref >60)
Glucose, Bld: 108 mg/dL — ABNORMAL HIGH (ref 65–99)
POTASSIUM: 4.2 mmol/L (ref 3.5–5.1)
SODIUM: 137 mmol/L (ref 135–145)

## 2015-12-15 LAB — HIV ANTIBODY (ROUTINE TESTING W REFLEX): HIV Screen 4th Generation wRfx: NONREACTIVE

## 2015-12-15 LAB — STREP PNEUMONIAE URINARY ANTIGEN: STREP PNEUMO URINARY ANTIGEN: NEGATIVE

## 2015-12-15 MED ORDER — NITROGLYCERIN 0.4 MG SL SUBL: 0.4000 mg | SUBLINGUAL_TABLET | SUBLINGUAL | Status: AC | PRN

## 2015-12-15 MED ORDER — AZITHROMYCIN 250 MG PO TABS: ORAL_TABLET | ORAL | Status: DC

## 2015-12-15 MED ORDER — ATORVASTATIN CALCIUM 40 MG PO TABS: 40.0000 mg | ORAL_TABLET | Freq: Every day | ORAL | Status: DC

## 2015-12-15 MED ORDER — AZITHROMYCIN 250 MG PO TABS: 250.0000 mg | ORAL_TABLET | Freq: Every day | ORAL | Status: DC

## 2015-12-15 MED ORDER — CEFUROXIME AXETIL 250 MG PO TABS: 250.0000 mg | ORAL_TABLET | Freq: Two times a day (BID) | ORAL | Status: DC

## 2015-12-15 NOTE — Progress Notes (Signed)
Discharge instructions and prescriptions given, verbalized understanding, out in stable condition via w/c with staff. 

## 2015-12-15 NOTE — Telephone Encounter (Signed)
Spoke with pt's wife and she states that pt was released from hospital yesterday. They tried him on Breo in the hospital but he developed thrush. She rescheduled his appt from tomorrow to 12/22/15 for hospital f/u and med calendar. Nothing further needed.

## 2015-12-15 NOTE — Care Management Note (Signed)
Case Management Note  Patient Details  Name: [name] [name] MRN: [id number] Date of Birth: [birth year]  Subjective/Objective : Pt is from home with family. He is independent with ADL's. Repots no issues with getting to appointments or obtaining meds. No DME needs at this time.                   Action/Plan : No CM needs identified.    Expected Discharge Date:  12/15/15               Expected Discharge Plan:  Home/Self Care  In-House Referral:  NA  Discharge planning Services  CM Consult  Post Acute Care Choice:  NA Choice offered to:  NA  DME Arranged:    DME Agency:     HH Arranged:    HH Agency:     Status of Service:  Completed, signed off  Medicare Important Message Given:    Date Medicare IM Given:    Medicare IM give by:    Date Additional Medicare IM Given:    Additional Medicare Important Message give by:     If discussed at [place] of Stay Meetings, dates discussed:    Additional Comments:  [name] [name], [name] [place], RN 12/15/2015, 1:10 PM

## 2015-12-15 NOTE — Telephone Encounter (Signed)
Informed patient's DPR of results and verbal understanding expressed.   Instructed her to INCREASE LIPITOR to 40 mg daily. FLP and ALT scheduled 7/7. DPR agrees with treatment plan.  Nitro refill sent in per request.

## 2015-12-15 NOTE — Discharge Summary (Signed)
Physician Discharge Summary  [name] [name] [id number] DOB: [birth year] DOA: 12/13/2015  PCP: Gara Kroner, MD  Admit date: 12/13/2015 Discharge date: 12/15/2015  Time spent: 20 minutes  Recommendations for Outpatient Follow-up:  1. Follow up with PCP in 2-3 weeks   Discharge Diagnoses:  Principal Problem:   CAP (community acquired pneumonia) Active Problems:   CKD (chronic kidney disease) stage 3, GFR 30-59 ml/min   COPD GOLD II   Dysphagia   Anorexia   History of thrush   Hypertension, essential   Dementia   Discharge Condition: Improved  Diet recommendation: Heart healthy  Filed Weights   12/13/15 1305  Weight: 78.926 kg (174 lb)    History of present illness:  Please review dictated H and P from 5/23 for details. Briefly, 82 y.o. male with history of COPD, dementia, CAD/ hx CABG, HL, CKD 3, PVD, BPH and glaucoma. Brought to ED by pts wife with hx of not eating > 1 week, very weak, hard to get up now. Difficulty swallowing reported.  Hospital Course:   1. Failure-to-thrive 1. Suspect secondary to advanced dementia 2. Continued to encourage by mouth intake as tolerated 3. Patient was given hydration this admission 2. Advanced dementia 1. Family reports mental status is nearing his baseline confusion 2. Continued with supportive care 3. Remained stable this admission 3. Possible PNA  1. cough/ R sided infiltrates 2. Initially continued azithromycin and Rocephin. Patient to complete course with azithromycin and Ceftin on discharge 4. Anorexia/ poss dysphagia  1. hx recent thrush 2. Have consulted SLP for evaluation. Patient was cleared for regular diet with thin liquids 5. COPD - not symptomatic at this time 1. No wheezing on exam. 2. Continue supportive care 6. Hx CABG 1. Seems stable at present. 7. HTN 1. Blood pressure currently stable, albeit suboptimally controlled 2. Continue to monitor and titrate blood pressure medications as  needed 8. HL 1. Stable 9. Recent thrush  Discharge Exam: Filed Vitals:   12/14/15 1407 12/14/15 2249 12/15/15 0626 12/15/15 1120  BP: 134/64 120/60 185/73   Pulse: 64 62 73   Temp: 98.1 F (36.7 C) 98.4 F (36.9 C) 97.9 F (36.6 C)   TempSrc:  Oral Oral   Resp: 16 14 16    Height:      Weight:      SpO2: 95% 94% 96% 95%    General: Awake, in nad Cardiovascular: regular, s1, s2 Respiratory: normal resp effort, no wheezing  Discharge Instructions     Medication List    TAKE these medications        acetaminophen 500 MG tablet  Commonly known as:  TYLENOL  Take 1,000 mg by mouth every 6 (six) hours as needed for headache.     aspirin 325 MG tablet  Take 325 mg by mouth daily.     atorvastatin 20 MG tablet  Commonly known as:  LIPITOR  Take 1 tablet (20 mg total) by mouth daily.     azithromycin 250 MG tablet  Commonly known as:  ZITHROMAX  1 tab po qday x 3 more days. Zero refills     BROVANA 15 MCG/2ML Nebu  Generic drug:  arformoterol  Take 15 mcg by nebulization 2 (two) times daily.     calcitRIOL 0.5 MCG capsule  Commonly known as:  ROCALTROL  Take 0.5 mcg by mouth daily.     cefUROXime 250 MG tablet  Commonly known as:  CEFTIN  Take 1 tablet (250 mg total) by mouth 2 (two) times  daily with a meal.     cetirizine 10 MG tablet  Commonly known as:  ZYRTEC  Take 10 mg by mouth daily as needed for allergies.     donepezil 10 MG tablet  Commonly known as:  ARICEPT  Take 1 tablet (10 mg total) by mouth at bedtime.     feeding supplement (ENSURE ENLIVE) Liqd  Take 237 mLs by mouth 2 (two) times daily between meals.     ferrous sulfate 325 (65 FE) MG tablet  Take 325 mg by mouth every morning.     finasteride 5 MG tablet  Commonly known as:  PROSCAR  Take 5 mg by mouth every morning.     FLOMAX 0.4 MG Caps capsule  Generic drug:  tamsulosin  Take 0.8 mg by mouth at bedtime.     fluticasone 50 MCG/ACT nasal spray  Commonly known as:  FLONASE   Place 2 sprays into both nostrils daily as needed for allergies.     gabapentin 300 MG capsule  Commonly known as:  NEURONTIN  Take 1 capsule (300 mg total) by mouth 3 (three) times daily.     guaiFENesin-dextromethorphan 100-10 MG/5ML syrup  Commonly known as:  ROBITUSSIN DM  Take 5 mLs by mouth every 4 (four) hours as needed for cough.     ipratropium 0.06 % nasal spray  Commonly known as:  ATROVENT  Place 1 spray into both nostrils 4 (four) times daily.     lansoprazole 30 MG capsule  Commonly known as:  PREVACID  Take 30 mg by mouth daily.     latanoprost 0.005 % ophthalmic solution  Commonly known as:  XALATAN  Place 1 drop into both eyes at bedtime.     lubiprostone 24 MCG capsule  Commonly known as:  AMITIZA  Take 24 mcg by mouth every evening.     memantine 10 MG tablet  Commonly known as:  NAMENDA  Take 1 tablet (10 mg total) by mouth 2 (two) times daily.     metoCLOPramide 10 MG tablet  Commonly known as:  REGLAN  Take 10 mg by mouth 2 (two) times daily. Reported on 09/30/2015     metoprolol succinate 25 MG 24 hr tablet  Commonly known as:  TOPROL-XL  Take 1 tablet (25 mg total) by mouth daily.     mirtazapine 30 MG tablet  Commonly known as:  REMERON  Take 30 mg by mouth at bedtime.     MUCUS RELIEF CHEST CONGESTION 100 MG/5ML liquid  Generic drug:  guaiFENesin  Take 200 mg by mouth 3 (three) times daily as needed for cough.     multivitamin with minerals Tabs tablet  Take 1 tablet by mouth daily.     nitroGLYCERIN 0.4 MG SL tablet  Commonly known as:  NITROSTAT  Place 1 tablet (0.4 mg total) under the tongue every 5 (five) minutes as needed for chest pain.     nystatin 100000 UNIT/ML suspension  Commonly known as:  MYCOSTATIN  Use as directed 5 mLs in the mouth or throat 4 (four) times daily.     ondansetron 4 MG tablet  Commonly known as:  ZOFRAN  Take 4 mg by mouth every 8 (eight) hours as needed for nausea or vomiting. Reported on 12/02/2015      OXYGEN  Inhale 2 L into the lungs at bedtime. 2lpm with sleep     polyethylene glycol packet  Commonly known as:  MIRALAX / GLYCOLAX  Take 17 g by mouth daily  as needed for moderate constipation.     tiZANidine 2 MG tablet  Commonly known as:  ZANAFLEX  Take 2 mg by mouth daily as needed. For muscle spasms     vitamin B-12 1000 MCG tablet  Commonly known as:  CYANOCOBALAMIN  Take 1,000 mcg by mouth daily.     Vitamin D3 5000 units Tabs  Take 5,000 Units by mouth every other day. Reported on 09/30/2015       No Known Allergies Follow-up Information    Follow up with Gara Kroner, MD On 12/27/2015.   Specialty:  Family Medicine   Why:  At 2pm with Marilynne Drivers, [place] for Hospital follow up   Contact information:   [place] [address] [phone]        The results of significant diagnostics from this hospitalization (including imaging, microbiology, ancillary and laboratory) are listed below for reference.    Significant Diagnostic Studies: Dg Chest 2 View  12/13/2015  CLINICAL DATA:  Cough and difficulty swallowing. EXAM: CHEST  2 VIEW COMPARISON:  Two-view chest x-ray 08/25/2015. FINDINGS: The heart is enlarged. Median sternotomy for CABG is noted. Diffuse interstitial airspace disease is again noted. There is increased opacity over the right middle lobe and a probable right sided effusion. Degenerative changes of the thoracic spine are stable. IMPRESSION: 1. Superimposed right middle lobe airspace disease on chronic interstitial coarsening worrisome for acute pneumonia. 2. Cardiomegaly without failure. Electronically Signed   By: San Morelle M.D.   On: 12/13/2015 16:13    Microbiology: Recent Results (from the past 240 hour(s))  Blood culture (routine x 2)     Status: None (Preliminary result)   Collection Time: 12/13/15  5:22 PM  Result Value Ref Range Status   Specimen Description BLOOD RIGHT HAND  Final   Special Requests  BOTTLES DRAWN AEROBIC ONLY 4CC  Final   Culture NO GROWTH 2 DAYS  Final   Report Status PENDING  Incomplete  Blood culture (routine x 2)     Status: None (Preliminary result)   Collection Time: 12/13/15  5:44 PM  Result Value Ref Range Status   Specimen Description BLOOD LEFT HAND  Final   Special Requests BOTTLES DRAWN AEROBIC AND ANAEROBIC 6CC EACH  Final   Culture NO GROWTH 2 DAYS  Final   Report Status PENDING  Incomplete     Labs: Basic Metabolic Panel:  Recent Labs Lab 12/13/15 1532 12/15/15 0539  NA 139 137  K 4.6 4.2  CL 104 110  CO2 26 21*  GLUCOSE 86 108*  BUN 26* 20  CREATININE 2.11* 1.73*  CALCIUM 9.8 9.3   Liver Function Tests:  Recent Labs Lab 12/13/15 1532  AST 26  ALT 21  ALKPHOS 57  BILITOT 0.4  PROT 8.5*  ALBUMIN 4.0   No results for input(s): LIPASE, AMYLASE in the last 168 hours. No results for input(s): AMMONIA in the last 168 hours. CBC:  Recent Labs Lab 12/13/15 1532  WBC 6.6  NEUTROABS 3.0  HGB 12.3*  HCT 36.4*  MCV 100.3*  PLT 199   Cardiac Enzymes:  Recent Labs Lab 12/13/15 1532  TROPONINI <0.03   BNP: BNP (last 3 results) No results for input(s): BNP in the last 8760 hours.  ProBNP (last 3 results) No results for input(s): PROBNP in the last 8760 hours.  CBG: No results for input(s): GLUCAP in the last 168 hours.   Signed:  Jace Dowe, Orpah Melter  [facility] 12/15/2015, 1:58  PM

## 2015-12-15 NOTE — Progress Notes (Signed)
Pharmacy Antibiotic Note  [name] [name] is a 82 y.o. male admitted on 12/13/2015 with pneumonia.   Pt on Rocephin and Zithromax.  Afebrile.  Taking PO meds.  Height: 5\' 7"  (170.2 cm) Weight: 174 lb (78.926 kg) IBW/kg (Calculated) : 66.1  Temp (24hrs), Avg:98.1 F (36.7 C), Min:97.9 F (36.6 C), Max:98.4 F (36.9 C)   Recent Labs Lab 12/13/15 1532 12/15/15 0539  WBC 6.6  --   CREATININE 2.11* 1.73*    Estimated Creatinine Clearance: 30.8 mL/min (by C-G formula based on Cr of 1.73).    No Known Allergies  Antimicrobials this admission: rocephin 5/23 >>  Zithromax 5/23 >>  PHARMACIST - PHYSICIAN COMMUNICATION CONCERNING: Antibiotic IV to Oral Route Change Policy  RECOMMENDATION: This patient is receiving ZITHROMAX by the intravenous route.  Based on criteria approved by the Pharmacy and Therapeutics Committee, the antibiotic(s) is/are being converted to the equivalent oral dose form(s).  DESCRIPTION: These criteria include:  Patient being treated for a respiratory tract infection, urinary tract infection, cellulitis or clostridium difficile associated diarrhea if on metronidazole  The patient is not neutropenic and does not exhibit a GI malabsorption state  The patient is eating (either orally or via tube) and/or has been taking other orally administered medications for a least 24 hours  The patient is improving clinically and has a Tmax < 100.5  If you have questions about this conversion, please contact the Pharmacy Department  [x]   [phone] )  [facility] []   [phone] )  [facility] []   [phone] )  [facility] []   [phone] )  [facility] []   [phone] )  [facility], [place] 12/15/2015 11:12 AM

## 2015-12-15 NOTE — Telephone Encounter (Signed)
Pt wife calling stating that husband was just released from hospital and will not make appoint tomorrow, she was to bring in fomulary to see which meds would work best for him, said that they had put him on breo in the hosp but that irritated his throat, said that she thinks that she found two that would work but wasn't real sure the one that he was on before was to much for them, she would like [name] Parrett to give her a call before she leaves today.Hillery Hunter

## 2015-12-15 NOTE — Telephone Encounter (Signed)
-----   Message from Sueanne Margarita, MD sent at 12/11/2015  6:53 PM EDT ----- LDL not at goal - increase lipitor to 40mg  daily and repeat FLP and ALT In 6 weeks

## 2015-12-15 NOTE — Telephone Encounter (Signed)
New message   Pt wife is calling to get information on the coloresteral

## 2015-12-15 NOTE — Consult Note (Signed)
   [facility] Inpatient Consult   12/15/2015  JEREMYAH GUGLIELMO 12-Mar-1931 [id number]   Patient is currently active with [facility] for chronic disease management services.  Patient has been engaged by a [employer].  Our community based plan of care has focused on disease management and community resource support. Active consent for [facility] services on file.  Patient will receive a post discharge transition of care call and will be evaluated for monthly home visits for assessments and disease process education. [facility] Care Management following for ongoing community care management. Inpatient care manager notified of [facility] program involvement.   Of note, [facility] Care Management services does not replace or interfere with any services that are needed or arranged by inpatient case management or social work.   For additional questions or referrals please contact:  Royetta Crochet. Laymond Purser, RN, BSN, [place] [phone]) Business Cell  [phone]) Toll Free Office

## 2015-12-16 ENCOUNTER — Other Ambulatory Visit: Payer: Self-pay | Admitting: *Deleted

## 2015-12-16 ENCOUNTER — Ambulatory Visit: Payer: Commercial Managed Care - HMO | Admitting: Cardiology

## 2015-12-16 ENCOUNTER — Ambulatory Visit: Payer: Commercial Managed Care - HMO | Admitting: Adult Health

## 2015-12-16 NOTE — Patient Outreach (Signed)
Transition of care  Discharged 12/15/15, admitted with COPD/pneumonia  Call to patient home, spoke with patient wife, Peter Congo She states patient was inpatient for COPD exacerbation and pneumonia. She states he is some better. She was able to review medications with RNCM. Patient has 2 antibiotics to take for 3 days. He has nystatin for thrush in his mouth. Patient has f/u appointment with pulmonology next week. Patient wife will reschedule GI appointment as they had to miss it due to inpatient.    Patient was recently discharged from hospital and all medications have been reviewed. Plan: [facility] CM Care Plan Problem One        Most Recent Value   Care Plan Problem One  COPD knowledge deficit   Role Documenting the Problem One  Care Management Coordinator   Care Plan for Problem One  Active   [facility] Long Term Goal (31-90 days)  Caregiver will be able to verbalize action plan for COPD exacerbation in the next 60 days   [facility] Long Term Goal Start Date  12/02/15   Interventions for Problem One Long Term Goal  reviewed COPD action plan with wife   [facility] CM Short Term Goal #1 (0-30 days)  Caregiver will be able to verbalize understanding of medications for COPD over the next 21 days   [facility] CM Short Term Goal #1 Start Date  12/16/15 Billy Fischer as patient had hospitalization]   Interventions for Short Term Goal #1  reviewed medications for COPD and changes made      Plan to visit later in month, continue Transition of care Cliford Sequeira E. Laymond Purser, RN, BSN, [place] [phone]

## 2015-12-17 LAB — BLOOD GAS, ARTERIAL
Acid-base deficit: 4.7 mmol/L — ABNORMAL HIGH (ref 0.0–2.0)
BICARBONATE: 20.5 meq/L (ref 20.0–24.0)
DRAWN BY: 221791
FIO2: 21
O2 SAT: 89.7 %
PATIENT TEMPERATURE: 37
TCO2: 14.2 mmol/L (ref 0–100)
pCO2 arterial: 37.4 mmHg (ref 35.0–45.0)
pH, Arterial: 7.348 — ABNORMAL LOW (ref 7.350–7.450)
pO2, Arterial: 64.9 mmHg — ABNORMAL LOW (ref 80.0–100.0)

## 2015-12-18 LAB — CULTURE, BLOOD (ROUTINE X 2)
CULTURE: NO GROWTH
CULTURE: NO GROWTH

## 2015-12-22 ENCOUNTER — Ambulatory Visit (INDEPENDENT_AMBULATORY_CARE_PROVIDER_SITE_OTHER): Payer: Commercial Managed Care - HMO | Admitting: Adult Health

## 2015-12-22 ENCOUNTER — Encounter: Payer: Self-pay | Admitting: Adult Health

## 2015-12-22 VITALS — BP 124/78 | HR 61 | Temp 97.6°F | Ht 67.0 in | Wt 170.0 lb

## 2015-12-22 DIAGNOSIS — J189 Pneumonia, unspecified organism: Secondary | ICD-10-CM | POA: Diagnosis not present

## 2015-12-22 DIAGNOSIS — J9611 Chronic respiratory failure with hypoxia: Secondary | ICD-10-CM

## 2015-12-22 DIAGNOSIS — J449 Chronic obstructive pulmonary disease, unspecified: Secondary | ICD-10-CM | POA: Diagnosis not present

## 2015-12-22 MED ORDER — TIOTROPIUM BROMIDE-OLODATEROL 2.5-2.5 MCG/ACT IN AERS: 2.0000 | INHALATION_SPRAY | Freq: Every day | RESPIRATORY_TRACT | Status: DC

## 2015-12-22 NOTE — Progress Notes (Signed)
Subjective:    Patient ID: [name] [name], male    DOB: 05/28/1931  MRN: [id number]    Brief patient profile:  82 year old male quit smoking in 1980s  Followed previously by Dr Joya Gaskins for copd with gold III criteria April 2016    History of Present Illness  02/25/2015 Follow up : COPD  NP ov    Patient was recently seen for a COPD flare. He was treated with a Z-Pak and prednisone. He did improve, however , symptoms continued to linger and was called in amoxicillin 1 week prior to OV   He has a few days left of this. He is starting to feel better with decreased cough, congestion. He is now on oxygen 2 L. Wife requests a Marine scientist. rec Finish Antibiotics as directed.  Mucinex DM Twice daily  As needed  Cough/congestion  Wear Oxygen 2l/m   Order for portable oxygen concentrator .     04/26/2015  Ext ov/Wert establish care re: GOLD III copd/ noct 02 dep / maint on brovana but no bud Chief Complaint  Patient presents with  . Follow-up    Pt states his breathing is overall doing well. He still c/o chest congestion, but not coughing much.   using 02 prn   A few hours a day not with exertion, not necessarily at hs  Worse swelling, has kidney doctor and heart doctor but unsure who to call re swelling  Continue 02 2lpm at bedtime for now  - ok to leave off during the day and just use with heavy exertion  Prednisone 10 mg take  4 each am x 2 days,   2 each am x 2 days,  1 each am x 2 days and stop  Add budesonide 0.25 mg twice daily with nebulizer treatments  Let your kidney doctor know about the swelling     05/24/2015 Follow up : GOLD III COPD , noct O2  Pt returns with his wife for 1 month follow up  He has moderate dementia on aricept and namenda.  Last ov with COPD flare , tx w/ pred taper .  Budesonide Neb was added to his Brovana neb regimen  She says he is doing better w/ decreased cough and wheezing.  Does have drippy nose on/off.  PVX and Flu shot are  utd Discussed prevnar vaccine for today  rec No change rx   08/25/2015  f/u ov/Wert re: COPD III copd/ noct 02 and brovana/ bud maint  Chief Complaint  Patient presents with  . Follow-up    Cough has not improved- coughing up min yellow sputum at times. He also c/o constant rhinitis and PND.   not that much noct or am cough mostly daytime despite "constant rhinitis" rec Prevacid 30 mg Take 30- 60 min before your first and last meals of the day  GERD diet     11/07/2015  f/u ov/Wert re: brovana/bud but can no longer afford / 02 2lpm hs only  Chief Complaint  Patient presents with  . Follow-up    Cough has improved some. He has had minimal wheezing and chest tightness- mainly at night and relates to allergies. His cough is prod at times with clear sputum.   >>change neb to [facility]   12/22/2015 Follow up : COPD III /Nocturnal O2  Patient returns for a 6 week follow-up for COPD. Since last visit. Patient was recently admitted last week for community-acquired pneumonia with right-sided infiltrate on x-ray. Patient has advanced dementia and  presented to the emergency room with failure to thrive-like symptoms. He was treated with antibiotics with improvement in mentation.. Since discharge. Patient is improved with decreased cough and congestion. He does still get some intermittent thick mucus. And shortness of breath with activity. Wife is with him today . Says his swallow is better. Does see GI for esophageal motility , currently on reglan , amitiza , miralax and prevacid .  He was started on BREO last ov , had hard time to use this. Did get thrush . Wife has to help him with meds.  Neb were not covered with their [employer].       Current Medications, Allergies, Complete Past Medical History, Past Surgical History, Family History, and Social History were reviewed in [employer] record.  ROS  The following are not active complaints unless bolded sore throat,  dysphagia, dental problems or excess/ purulent secretions, ear ache,   fever, chills, sweats, unintended wt loss, classically pleuritic or exertional cp, hemoptysis,  orthopnea pnd or leg swelling, presyncope, palpitations, abdominal pain, anorexia, nausea, vomiting, diarrhea  or change in bowel or bladder habits, change in stools or urine, dysuria,hematuria,  rash, arthralgias, visual complaints, headache, numbness, weakness or ataxia or problems with walking or coordination,  change in mood/affect or memory.              Objective:   Physical Exam  GEN: A/Ox3; pleasant , NAD, elderly stoic amb bm nad    Filed Vitals:   12/22/15 1141  BP: 124/78  Pulse: 61  Temp: 97.6 F (36.4 C)  TempSrc: Oral  Height: 5\' 7"  (1.702 m)  Weight: 170 lb (77.111 kg)  SpO2: 96%    Vital signs reviewed    HEENT:  [place]/AT,  EACs-clear, TMs-wnl, NOSE-clear, THROAT-clear, no lesions, no postnasal drip or exudate noted. - edentulous with dentures in place  NECK:  Supple w/ fair ROM; no JVD; normal carotid impulses w/o bruits; no thyromegaly or nodules palpated; no lymphadenopathy.  RESP   Completely clear bilaterally to A and P   CARD:  RRR, no m/r/g  , no peripheral edema, pulses intact, no cyanosis or clubbing - no sign edema   GI:   Soft & nt; nml bowel sounds; no organomegaly or masses detected.  Musco: Warm bil, no deformities or joint swelling noted.   Neuro: alert, no focal deficits noted.    Skin: Warm, no lesions or rashes      CXR PA and Lateral:    12/13/15  Superimposed right middle lobe airspace disease on chronic interstitial coarsening worrisome for acute pneumonia  Idil Maslanka NP-C   Pulmonary and Critical Care  12/22/2015       Assessment & Plan:

## 2015-12-22 NOTE — Assessment & Plan Note (Signed)
  Continue on Oxygen At bedtime   Follow up Dr. Melvyn Novas  In 4-6 weeks with chest xray .

## 2015-12-22 NOTE — Assessment & Plan Note (Signed)
Recent admission for PNA  Clinically improving  High risk for aspiration pna with advance demential  Check cxr on return in 4 weeks   Plan  Aspiration precautions with no eating 3 hrs before bedtime, small meals .  Continue on Oxygen At bedtime   Follow up Dr. Melvyn Novas  In 4-6 weeks with chest xray .

## 2015-12-22 NOTE — Assessment & Plan Note (Signed)
Unable to use BREO d/t thrush. Neb not affordable.  Begin Stiolto 2 puffs daily , rinse well after use. (respimat may be better (   Follow up Dr. Melvyn Novas  In 4-6 weeks with chest xray .

## 2015-12-22 NOTE — Patient Instructions (Signed)
Begin Stiolto 2 puffs daily , rinse well after use.  Aspiration precautions with no eating 3 hrs before bedtime, small meals .  Continue on Oxygen At bedtime   Follow up Dr. Melvyn Novas  In 4-6 weeks with chest xray .

## 2015-12-23 ENCOUNTER — Other Ambulatory Visit: Payer: Self-pay | Admitting: *Deleted

## 2015-12-23 NOTE — Patient Outreach (Signed)
Transition of Care week #2  Call to patient home, spoke with patient wife Peter Congo. Patient saw lung specialist yesterday, new inhaler in place of brovana/breo  Wife reports congestion is better, but patient still has a lot of mucous.  He is eating a little better, still having issues with thrush.  Assessment: COPD-patient doing better from recent pneumonia, still has some congestion, saw pulmonology yesterday, has new inhaler. RNCM instructed to be sure to rinse mouth out after using inhalers.  Plan: Continue Transition of care program Scheduled visit for next week.    Royetta Crochet. Laymond Purser, RN, BSN, [place] [phone]

## 2015-12-23 NOTE — Progress Notes (Signed)
Chart and office note reviewed in detail  > agree with a/p as outlined    

## 2015-12-27 DIAGNOSIS — R630 Anorexia: Secondary | ICD-10-CM | POA: Diagnosis not present

## 2015-12-27 DIAGNOSIS — F0390 Unspecified dementia without behavioral disturbance: Secondary | ICD-10-CM | POA: Diagnosis not present

## 2015-12-27 DIAGNOSIS — J189 Pneumonia, unspecified organism: Secondary | ICD-10-CM | POA: Diagnosis not present

## 2015-12-30 ENCOUNTER — Other Ambulatory Visit: Payer: Self-pay | Admitting: *Deleted

## 2015-12-30 NOTE — Patient Outreach (Signed)
[facility]) Care Management   12/30/2015  KAMDIN KENON 08/12/1930 [id number]  [name] [name] is an 82 y.o. male  Subjective:   Patient saw primary care this week. Patient feeling better as far as breathing. Patient having difficulty swallowing.  Wife still providing a lot of hands on care for patient. She is worried about patient diet intake and swallowing. Still needs to reschedule GI MD appointment that was missed due to hospitalization  Objective:   Patient neatly groomed and dressed. Home neat and clean.  BP 116/72 mmHg  Pulse 60  Wt 169 lb (76.658 kg)  SpO2 92% Review of Systems  Constitutional: Negative.   HENT: Negative.   Eyes: Negative.   Respiratory: Positive for shortness of breath.   Cardiovascular: Negative.   Gastrointestinal: Negative.   Genitourinary: Negative.        Uses pull ups  Musculoskeletal: Negative.   Skin: Negative.   Neurological: Negative.   Endo/Heme/Allergies: Negative.     Physical Exam  Cardiovascular: Normal rate.   Respiratory: He is in respiratory distress.  GI: Soft. Bowel sounds are normal.  Neurological: He is alert.  Oriented to self and place, not time  Skin: Skin is warm and dry.    Encounter Medications:   Outpatient Encounter Prescriptions as of 12/30/2015  Medication Sig Note  . acetaminophen (TYLENOL) 500 MG tablet Take 1,000 mg by mouth every 6 (six) hours as needed for headache.   Marland Kitchen aspirin 325 MG tablet Take 325 mg by mouth daily.     Marland Kitchen atorvastatin (LIPITOR) 40 MG tablet Take 1 tablet (40 mg total) by mouth daily. 12/16/2015: Increased due to cholesterol levels  . calcitRIOL (ROCALTROL) 0.5 MCG capsule Take 0.5 mcg by mouth daily.   . cetirizine (ZYRTEC) 10 MG tablet Take 10 mg by mouth daily as needed for allergies.   . Cholecalciferol (VITAMIN D3) 5000 UNITS TABS Take 5,000 Units by mouth every other day. Reported on 09/30/2015   . donepezil (ARICEPT) 10 MG tablet Take 1 tablet (10 mg total) by  mouth at bedtime.   . feeding supplement, ENSURE ENLIVE, (ENSURE ENLIVE) LIQD Take 237 mLs by mouth 2 (two) times daily between meals.   . ferrous sulfate 325 (65 FE) MG tablet Take 325 mg by mouth every morning.    . finasteride (PROSCAR) 5 MG tablet Take 5 mg by mouth every morning.    . fluticasone (FLONASE) 50 MCG/ACT nasal spray Place 2 sprays into both nostrils daily as needed for allergies.    Marland Kitchen gabapentin (NEURONTIN) 300 MG capsule Take 1 capsule (300 mg total) by mouth 3 (three) times daily. (Patient taking differently: Take 300 mg by mouth 2 (two) times daily. )   . guaiFENesin-dextromethorphan (ROBITUSSIN DM) 100-10 MG/5ML syrup Take 5 mLs by mouth every 4 (four) hours as needed for cough.   Marland Kitchen ipratropium (ATROVENT) 0.06 % nasal spray Place 1 spray into both nostrils 4 (four) times daily.   . lansoprazole (PREVACID) 30 MG capsule Take 30 mg by mouth daily.  12/16/2015: Had to miss appointment due to hospitalization, rescheduling   . latanoprost (XALATAN) 0.005 % ophthalmic solution Place 1 drop into both eyes at bedtime.    Marland Kitchen lubiprostone (AMITIZA) 24 MCG capsule Take 24 mcg by mouth every evening.    . memantine (NAMENDA) 10 MG tablet Take 1 tablet (10 mg total) by mouth 2 (two) times daily.   . metoCLOPramide (REGLAN) 10 MG tablet Take 10 mg by mouth 2 (two)  times daily. Reported on 09/30/2015   . metoprolol succinate (TOPROL-XL) 25 MG 24 hr tablet Take 1 tablet (25 mg total) by mouth daily.   . mirtazapine (REMERON) 30 MG tablet Take 30 mg by mouth at bedtime.   . Multiple Vitamin (MULTIVITAMIN WITH MINERALS) TABS tablet Take 1 tablet by mouth daily.   . nitroGLYCERIN (NITROSTAT) 0.4 MG SL tablet Place 1 tablet (0.4 mg total) under the tongue every 5 (five) minutes as needed for chest pain.   Marland Kitchen nystatin (MYCOSTATIN) 100000 UNIT/ML suspension Use as directed 5 mLs in the mouth or throat 4 (four) times daily.  12/13/2015: Received from: External Pharmacy  . ondansetron (ZOFRAN) 4 MG tablet  Take 4 mg by mouth every 8 (eight) hours as needed for nausea or vomiting. Reported on 12/02/2015   . OXYGEN Inhale 2 L into the lungs at bedtime. 2lpm with sleep   . polyethylene glycol (MIRALAX / GLYCOLAX) packet Take 17 g by mouth daily as needed for moderate constipation.    . Tamsulosin HCl (FLOMAX) 0.4 MG CAPS Take 0.8 mg by mouth at bedtime.    . Tiotropium Bromide-Olodaterol (STIOLTO RESPIMAT) 2.5-2.5 MCG/ACT AERS Inhale 2 puffs into the lungs daily. 12/30/2015: Patient wife assisting with administration, patient having difficulty with administration  . tiZANidine (ZANAFLEX) 2 MG tablet Take 2 mg by mouth daily as needed. For muscle spasms 12/13/2015: Received from: External Pharmacy Received Sig:   . vitamin B-12 (CYANOCOBALAMIN) 1000 MCG tablet Take 1,000 mcg by mouth daily.   Marland Kitchen arformoterol (BROVANA) 15 MCG/2ML NEBU Take 15 mcg by nebulization 2 (two) times daily. Reported on 12/30/2015 12/30/2015: Changed to stiolto respimat due to cost of brovana and breo  . guaiFENesin (MUCUS RELIEF CHEST CONGESTION) 100 MG/5ML liquid Take 200 mg by mouth 3 (three) times daily as needed for cough. Reported on 12/30/2015    No facility-administered encounter medications on file as of 12/30/2015.     Assessment:   COPD: Patient much improved. Able to review COPD zones with wife. Observed wife administer new inhaler.  TRANSPORTATION: Call to [facility] to verify transportation benefit and get transporation contact information, wrote it in blue [facility] calendar. SWALLOWING difficulty: Assessed patient swallowing, initially he was leaning forward and did not have much intake,  encouraged patient to hold head up straight when drinking, his intake increased when he put he held his head up straight vs leaning forward  Plan:   [facility] CM Care Plan Problem One        Most Recent Value   Care Plan Problem One  COPD knowledge deficit   Role Documenting the Problem One  Care Management Coordinator   Interventions for Problem One  Long Term Goal  Reviewed COPD zones with wife   [facility] CM Short Term Goal #1 (0-30 days)  Caregiver will be able to verbalize understanding of medications for COPD over the next 21 days   [facility] CM Short Term Goal #1 Start Date  12/16/15   Interventions for Short Term Goal #1  monitored patient and wife administering new medication stiolto    Wife will call and reschedule the GI appointment patient missed due to being in the hospital. Continue transition of care calls Will visit again in July  Mary E. Laymond Purser, RN, BSN, [place] [phone]

## 2016-01-03 DIAGNOSIS — I10 Essential (primary) hypertension: Secondary | ICD-10-CM | POA: Diagnosis not present

## 2016-01-03 DIAGNOSIS — J441 Chronic obstructive pulmonary disease with (acute) exacerbation: Secondary | ICD-10-CM | POA: Diagnosis not present

## 2016-01-03 DIAGNOSIS — R0600 Dyspnea, unspecified: Secondary | ICD-10-CM | POA: Diagnosis not present

## 2016-01-03 DIAGNOSIS — I739 Peripheral vascular disease, unspecified: Secondary | ICD-10-CM | POA: Diagnosis not present

## 2016-01-06 ENCOUNTER — Other Ambulatory Visit: Payer: Self-pay | Admitting: *Deleted

## 2016-01-06 NOTE — Patient Outreach (Signed)
Transition of care call  Spoke with patient wife. She states patient still having issues with swallowing, she is trying honey and vinegar. She states weight is 175#, appetite still up and down.  She reports patient breathing ok, she is still struggling to give new inhaler, plans to try until they see Dr. Melvyn Novas in July.   Upcoming appointments: 02/07/16-Dr. Charolotte Capuchin 02/02/16-Pulmonary 01/31/16-vascular 01/27/16-labs  Assessment: COPD: COPD zones reviewed, they have magnet on fridge. Encouraged to discuss inhaler use with Dr. Melvyn Novas so he can evaluate, she wants to try until they see Dr. Melvyn Novas in July. Discussed being outside and encouraged to be out early or late to avoid mid day due to heat and humidity effects on lungs. Reminded to rinse after inhaler use.  Swallowing:patient will see GI in July   Plan:  [facility] CM Care Plan Problem One        Most Recent Value   Care Plan Problem One  COPD knowledge deficit   Role Documenting the Problem One  Care Management Coordinator   Care Plan for Problem One  Active   [facility] Long Term Goal (31-90 days)  Caregiver will be able to verbalize action plan for COPD exacerbation in the next 60 days   Interventions for Problem One Long Term Goal  Reinforced  COPD zone monitoring with patient wife. reminded of  rinsing after inhaler use     Ongoing case management/transition of care call Call again next week and assess needs  [name] Kidney E. Laymond Purser, RN, BSN, [place] [phone]

## 2016-01-13 ENCOUNTER — Encounter: Payer: Self-pay | Admitting: *Deleted

## 2016-01-13 ENCOUNTER — Other Ambulatory Visit: Payer: Self-pay | Admitting: *Deleted

## 2016-01-13 NOTE — Patient Outreach (Signed)
Transition of Care  Spoke with wife. Patient is doing about the same.  Still has some issues with his mouth soreness. Patient still having swallowing issues, sees GI next month Wife states weight is about the same around 170# No issues with breathing, she is trying to increase patient strength by walking patient.  02/02/16-Pulmonologist 02/07/16-GI MD  Plan: [facility] CM Care Plan Problem One        Most Recent Value   Care Plan Problem One  COPD knowledge deficit   Role Documenting the Problem One  Care Management Coordinator   Care Plan for Problem One  Active   [facility] Long Term Goal (31-90 days)  Caregiver will be able to verbalize action plan for COPD exacerbation in the next 60 days   [facility] Long Term Goal Start Date  12/02/15   Interventions for Problem One Long Term Goal  Reviewed signs and symptoms of patient with wife. Reinforced to dicuss medication questions at [facility] appointment   [facility] CM Short Term Goal #1 (0-30 days)  Caregiver will be able to verbalize understanding of medications for COPD over the next 21 days   [facility] CM Short Term Goal #1 Start Date  12/16/15   Interventions for Short Term Goal #1  Reinforced  to speak with MD regarding inhalers     Plan to call again next week. Will continue transition of care program, consider transfer to Health Coach at next call. Royetta Crochet. Laymond Purser, RN, BSN, [place] [phone]

## 2016-01-14 DIAGNOSIS — J441 Chronic obstructive pulmonary disease with (acute) exacerbation: Secondary | ICD-10-CM | POA: Diagnosis not present

## 2016-01-14 DIAGNOSIS — J9611 Chronic respiratory failure with hypoxia: Secondary | ICD-10-CM | POA: Diagnosis not present

## 2016-01-14 DIAGNOSIS — J449 Chronic obstructive pulmonary disease, unspecified: Secondary | ICD-10-CM | POA: Diagnosis not present

## 2016-01-16 ENCOUNTER — Telehealth: Payer: Self-pay | Admitting: Neurology

## 2016-01-16 MED ORDER — MEMANTINE HCL 10 MG PO TABS: 10.0000 mg | ORAL_TABLET | Freq: Two times a day (BID) | ORAL | Status: DC

## 2016-01-16 NOTE — Telephone Encounter (Signed)
Pt's wife called said she would like memantine (NAMENDA) 10 MG tablet RX sent to [facility]. She said he has 6 days left.

## 2016-01-16 NOTE — Telephone Encounter (Signed)
Returned call to pt's wife on HIPPA - she is aware the rx has been sent to [facility].

## 2016-01-20 ENCOUNTER — Encounter: Payer: Self-pay | Admitting: Vascular Surgery

## 2016-01-20 ENCOUNTER — Other Ambulatory Visit: Payer: Self-pay | Admitting: *Deleted

## 2016-01-20 NOTE — Patient Outreach (Signed)
Transition of Care Call  No answer, will attempt again later. Royetta Crochet. Laymond Purser, RN, BSN, [place] [phone]

## 2016-01-23 ENCOUNTER — Other Ambulatory Visit: Payer: Self-pay | Admitting: *Deleted

## 2016-01-23 ENCOUNTER — Ambulatory Visit: Payer: Self-pay | Admitting: *Deleted

## 2016-01-23 NOTE — Patient Outreach (Signed)
Transition of care call Call to patient home, no answer. Left message with RNCM contact requesting call back. Plan to attempt again later in week. Royetta Crochet. Laymond Purser, RN, BSN, [place] [phone]

## 2016-01-25 ENCOUNTER — Other Ambulatory Visit: Payer: Self-pay | Admitting: *Deleted

## 2016-01-25 DIAGNOSIS — J441 Chronic obstructive pulmonary disease with (acute) exacerbation: Secondary | ICD-10-CM

## 2016-01-25 NOTE — Patient Outreach (Signed)
Call to patient spoke with wife, who is patient caregiver. Patient doing ok, his appetite is not very good. Patient weight is at 172-174# She states she is supplementing his diet with ensure.  Patient still needs wife to assist with use of inhaler, wife will speak with Dr. Wert about the inhaler and if there is a  Different medication that can be substituted.  Patient wife will use [employer] transportation for upcoming [place] Appointments  7/18 Dr. Edward-GI 7/13 Dr. Wert-Pulmonology/allergy  Wife verbalizes she would appreciate ongoing support from [facility] through Health Coach.  Assessment: COPD: Patient wife still having to administer new inhaler and assist patient-educated she needs to check in with pulmonologist about inhaler and alternative inhaler. Needs ongoing education around COPD, agrees to referral to Health Coach  Plan: [facility] CM Care Plan Problem One        Most Recent Value   Care Plan Problem One  COPD knowledge deficit   Role Documenting the Problem One  Care Management Coordinator   Care Plan for Problem One  Active   [facility] Long Term Goal (31-90 days)  Caregiver will be able to verbalize action plan for COPD exacerbation in the next 60 days   [facility] Long Term Goal Start Date  12/02/15   Interventions for Problem One Long Term Goal  Using teachback, discussed with caregiver to review the COPd medications at pulmonology appointment   [facility] CM Short Term Goal #1 (0-30 days)  Caregiver will be able to verbalize understanding of medications for COPD over the next 21 days   [facility] CM Short Term Goal #1 Start Date  12/16/15   [facility] CM Short Term Goal #1 Met Date  01/25/16   Interventions for Short Term Goal #1  Reviewed COPD zones     Will transfer to Health Coach for COPD education  E. , RN, BSN, CCM  [facility] Community Care Manager [phone] 

## 2016-01-26 ENCOUNTER — Encounter: Payer: Self-pay | Admitting: *Deleted

## 2016-01-27 ENCOUNTER — Other Ambulatory Visit (INDEPENDENT_AMBULATORY_CARE_PROVIDER_SITE_OTHER): Payer: Commercial Managed Care - HMO | Admitting: *Deleted

## 2016-01-27 DIAGNOSIS — E785 Hyperlipidemia, unspecified: Secondary | ICD-10-CM

## 2016-01-27 LAB — LIPID PANEL
CHOLESTEROL: 139 mg/dL (ref 125–200)
HDL: 28 mg/dL — ABNORMAL LOW (ref >=40)
LDL Cholesterol: 60 mg/dL (ref <130)
TRIGLYCERIDES: 254 mg/dL — AB (ref <150)
Total CHOL/HDL Ratio: 5 Ratio (ref <=5.0)
VLDL: 51 mg/dL — AB (ref <30)

## 2016-01-27 LAB — ALT: ALT: 28 U/L (ref 9–46)

## 2016-01-30 ENCOUNTER — Other Ambulatory Visit: Payer: Self-pay

## 2016-01-30 NOTE — Patient Outreach (Signed)
[facility]) Care Management  01/30/2016  [name] [name] 1930-08-11 [id number]  Telephone call to patient for introductory call. Spoke with wife as patient has some dementia. She report patient is doing ok.  She reports that his appetite is not the best but she supplements with ensure. She reports patient has a pulmonologist appointment this week.  Wife very receptive to call and health coach role.     Plan: RN Health Coach will contact patient/caregiver in the month of August and patient/caregiver agrees to next outreach.  [name], RN, MSN [place] [phone]

## 2016-01-31 ENCOUNTER — Ambulatory Visit: Payer: Commercial Managed Care - HMO | Admitting: Vascular Surgery

## 2016-02-02 ENCOUNTER — Telehealth: Payer: Self-pay

## 2016-02-02 ENCOUNTER — Ambulatory Visit (INDEPENDENT_AMBULATORY_CARE_PROVIDER_SITE_OTHER): Payer: Commercial Managed Care - HMO | Admitting: Internal Medicine

## 2016-02-02 ENCOUNTER — Encounter: Payer: Self-pay | Admitting: Internal Medicine

## 2016-02-02 ENCOUNTER — Ambulatory Visit (INDEPENDENT_AMBULATORY_CARE_PROVIDER_SITE_OTHER)
Admission: RE | Admit: 2016-02-02 | Discharge: 2016-02-02 | Disposition: A | Discharge status: Home / Self Care | Payer: Commercial Managed Care - HMO | Source: Ambulatory Visit | Attending: Internal Medicine | Admitting: Internal Medicine

## 2016-02-02 VITALS — BP 124/74 | HR 61 | Ht 67.0 in | Wt 174.0 lb

## 2016-02-02 DIAGNOSIS — J9611 Chronic respiratory failure with hypoxia: Secondary | ICD-10-CM

## 2016-02-02 DIAGNOSIS — J441 Chronic obstructive pulmonary disease with (acute) exacerbation: Secondary | ICD-10-CM | POA: Diagnosis not present

## 2016-02-02 DIAGNOSIS — E785 Hyperlipidemia, unspecified: Secondary | ICD-10-CM

## 2016-02-02 DIAGNOSIS — I739 Peripheral vascular disease, unspecified: Secondary | ICD-10-CM | POA: Diagnosis not present

## 2016-02-02 DIAGNOSIS — J449 Chronic obstructive pulmonary disease, unspecified: Secondary | ICD-10-CM | POA: Diagnosis not present

## 2016-02-02 DIAGNOSIS — R0600 Dyspnea, unspecified: Secondary | ICD-10-CM | POA: Diagnosis not present

## 2016-02-02 DIAGNOSIS — I10 Essential (primary) hypertension: Secondary | ICD-10-CM | POA: Diagnosis not present

## 2016-02-02 DIAGNOSIS — R05 Cough: Secondary | ICD-10-CM | POA: Diagnosis not present

## 2016-02-02 MED ORDER — FENOFIBRATE 160 MG PO TABS
160.0000 mg | ORAL_TABLET | Freq: Every day | ORAL | Status: DC
Start: 2016-02-02 — End: 2016-10-23

## 2016-02-02 MED ORDER — IPRATROPIUM-ALBUTEROL 0.5-2.5 (3) MG/3ML IN SOLN: 3.0000 mL | RESPIRATORY_TRACT | Status: AC | PRN

## 2016-02-02 NOTE — Telephone Encounter (Signed)
Informed patient's DPR of results and verbal understanding expressed.   Instructed her to have patient START FENOFIBRATE 160 mg daily. FLP and ALT scheduled September 1. She agrees with treatment plan.

## 2016-02-02 NOTE — Patient Instructions (Addendum)
Stop stiolto   For breathing >  Ok to use nebuilzer with Duoneb up to every 4 hours only as needed if can't catch your breath   If you are satisfied with your treatment plan,  let your doctor know and he/she can either refill your medications or you can return here when your prescription runs out.     If in any way you are not 100% satisfied,  please tell us.  If 100% better, tell your friends!  Pulmonary follow up is as needed

## 2016-02-02 NOTE — Assessment & Plan Note (Signed)
Enrolled in Gold COPD program 10/27/2014 Spirometry  10/27/14  FEV1  1.86 (73%) ratio 66  - Added bud to brovana 04/26/2015 > improved but could not afford deductible > changed to [place] 11/07/2015  -  02/02/2016 changed to duoneb prn for insurance purposes  And because could not learn respimat  - The proper method of use, as well as anticipated side effects, of a metered-dose inhaler are discussed and demonstrated to the patient. Improved effectiveness after extensive coaching during this visit to a level of approximately 0 % from a baseline of 0 %     At this point just should use duoneb prn  I had an extended final summary discussion with the patient reviewing all relevant studies completed to date and  lasting 15 to 20 minutes of a 25 minute visit on the following issues:    Each maintenance medication was reviewed in detail including most importantly the difference between maintenance and as needed and under what circumstances the prns are to be used.  Please see instructions for details which were reviewed in writing and the patient given a copy.

## 2016-02-02 NOTE — Progress Notes (Signed)
Subjective:    Patient ID: [name] [name], male    DOB: 02-17-1931  MRN: [id number]    Brief patient profile:  82 year old male quit smoking in 1980s  Followed previously by Dr Joya Gaskins for copd with gold III criteria April 2016    History of Present Illness   11/07/2015  f/u ov/[name] [name] re: COPD GOLD III rx brovana/bud but can no longer afford / 02 2lpm hs only  Chief Complaint  Patient presents with  . Follow-up    Cough has improved some. He has had minimal wheezing and chest tightness- mainly at night and relates to allergies. His cough is prod at times with clear sputum.   >>change neb to [facility]   12/22/2015 Follow up : COPD III /Nocturnal O2  Patient returns for a 6 week follow-up for COPD. Since last visit. Patient was recently admitted last week for community-acquired pneumonia with right-sided infiltrate on x-ray. Patient has advanced dementia and presented to the emergency room with failure to thrive-like symptoms. He was treated with antibiotics with improvement in mentation.. Since discharge. Patient is improved with decreased cough and congestion. He does still get some intermittent thick mucus. And shortness of breath with activity. Wife is with him today . Says his swallow is better. Does see GI for esophageal motility , currently on reglan , amitiza , miralax and prevacid .  He was started on BREO last ov , had hard time to use this. Did get thrush . Wife has to help him with meds.  Neb were not covered with their [place] insurance rec Begin Stiolto 2 puffs daily , rinse well after use.  Aspiration precautions with no eating 3 hrs before bedtime, small meals .  Continue on Oxygen At bedtime      02/02/2016  f/u ov/[name] [name] re:  GOLD II/ maintstiolto but not able to use correctly   / 0 2 2lpm at hs and prn daytime Chief Complaint  Patient presents with  . Follow-up    Breathing has improved some and only has occ cough. No new co's today.    very sedentary/ Not limited by breathing  from desired activities    No obvious day to day or daytime variability or assoc recent excess/ purulent sputum or mucus plugs  or cp or chest tightness, subjective wheeze or overt sinus or hb symptoms. No unusual exp hx or h/o childhood pna/ asthma or knowledge of premature birth.  Sleeping ok without nocturnal  or early am exacerbation  of respiratory  c/o's or need for noct saba. Also denies any obvious fluctuation of symptoms with weather or environmental changes or other aggravating or alleviating factors except as outlined above   Current Medications, Allergies, Complete Past Medical History, Past Surgical History, Family History, and Social History were reviewed in [employer] record.  ROS  The following are not active complaints unless bolded sore throat, dysphagia, dental problems, itching, sneezing,  nasal congestion or excess/ purulent secretions, ear ache,   fever, chills, sweats, unintended wt loss, classically pleuritic or exertional cp, hemoptysis,  orthopnea pnd or leg swelling, presyncope, palpitations, abdominal pain, anorexia, nausea, vomiting, diarrhea  or change in bowel or bladder habits, change in stools or urine, dysuria,hematuria,  rash, arthralgias, visual complaints, headache, numbness, weakness or ataxia or problems with walking or coordination,  change in mood/affect or memory.             Objective:   Physical Exam  GEN: A/Ox3; pleasant , NAD, elderly stoic  amb bm nad   Wt Readings from Last 3 Encounters:  02/02/16 174 lb (78.926 kg)  01/13/16 170 lb (77.111 kg)  12/30/15 169 lb (76.658 kg)    Vital signs reviewed / note sats 92% at rest   HEENT:  [place]/AT,  EACs-clear, TMs-wnl, NOSE-clear, THROAT-clear, no lesions, no postnasal drip or exudate noted. - edentulous with dentures in place  NECK:  Supple w/ fair ROM; no JVD; normal carotid impulses w/o bruits; no thyromegaly or nodules palpated; no lymphadenopathy.  RESP   Completely clear  bilaterally to A and P   CARD:  RRR, no m/r/g  , no peripheral edema, pulses intact, no cyanosis or clubbing - no sign edema   GI:   Soft & nt; nml bowel sounds; no organomegaly or masses detected.  Musco: Warm bil, no deformities or joint swelling noted.   Neuro: alert, no focal deficits noted.    Skin: Warm, no lesions or rashes    CXR PA and Lateral:   02/02/2016 :    I personally reviewed images and agree with radiology impression as follows:   Extensive chronic interstitial changes greatest on the right. No acute pneumonia nor CHF is observed. With the significant parenchymal changes present, occult interstitial pneumonia could be present and be radiographically inapparent. My impression:  C/w prev asp pna/ his clinical dx/ no active as dz          Assessment & Plan:

## 2016-02-02 NOTE — Telephone Encounter (Signed)
-----   Message from Sueanne Margarita, MD sent at 01/27/2016  4:28 PM EDT ----- Triglycerides too high - add fenofibrate 160mg  daily and repeat FLP and ALT in 6 weeks

## 2016-02-03 ENCOUNTER — Telehealth: Payer: Self-pay | Admitting: Cardiology

## 2016-02-03 MED ORDER — ATORVASTATIN CALCIUM 40 MG PO TABS: 40.0000 mg | ORAL_TABLET | Freq: Every day | ORAL | Status: DC

## 2016-02-03 NOTE — Telephone Encounter (Signed)
Informed DPR that patient is supposed to be taking Lipitor 40 mg daily. She reports she knows this, but her Rx was wrong when it was picked up. New Rx called in. Instructed DPR to have patient take fenofibrate 160 daily. Confirmed fasting lab appointment September 1. She was grateful for call.

## 2016-02-03 NOTE — Telephone Encounter (Signed)
Follow up   Pt wife is calling about the medications atorvastatin 10mg  and  Fenofibrate 160mg  2 tablets per day  She wants to know should pt continue taking it

## 2016-02-04 NOTE — Assessment & Plan Note (Signed)
04/26/2015   Walked RA  2 laps @ 185 ft each stopped due to  Leg pain, slow pace, sats at very end down to 85%  - 08/25/2015   [facility] RA  2 laps @ 185 ft each stopped due to  Tired, no sob, sats 87% (walking much more than he usually does  As of 02/02/2016 rec 2lpm hs and prn daytime, certainly no need at rest

## 2016-02-07 DIAGNOSIS — K3184 Gastroparesis: Secondary | ICD-10-CM | POA: Diagnosis not present

## 2016-02-07 DIAGNOSIS — R6889 Other general symptoms and signs: Secondary | ICD-10-CM | POA: Diagnosis not present

## 2016-02-07 DIAGNOSIS — F0390 Unspecified dementia without behavioral disturbance: Secondary | ICD-10-CM | POA: Diagnosis not present

## 2016-02-07 DIAGNOSIS — K5900 Constipation, unspecified: Secondary | ICD-10-CM | POA: Diagnosis not present

## 2016-02-07 DIAGNOSIS — K219 Gastro-esophageal reflux disease without esophagitis: Secondary | ICD-10-CM | POA: Diagnosis not present

## 2016-02-07 DIAGNOSIS — R630 Anorexia: Secondary | ICD-10-CM | POA: Diagnosis not present

## 2016-02-09 ENCOUNTER — Ambulatory Visit (INDEPENDENT_AMBULATORY_CARE_PROVIDER_SITE_OTHER): Payer: Commercial Managed Care - HMO | Admitting: Podiatry

## 2016-02-09 ENCOUNTER — Encounter: Payer: Self-pay | Admitting: Podiatry

## 2016-02-09 DIAGNOSIS — L853 Xerosis cutis: Secondary | ICD-10-CM

## 2016-02-09 DIAGNOSIS — M79676 Pain in unspecified toe(s): Secondary | ICD-10-CM | POA: Diagnosis not present

## 2016-02-09 DIAGNOSIS — R6889 Other general symptoms and signs: Secondary | ICD-10-CM | POA: Diagnosis not present

## 2016-02-09 DIAGNOSIS — B351 Tinea unguium: Secondary | ICD-10-CM | POA: Diagnosis not present

## 2016-02-10 DIAGNOSIS — J449 Chronic obstructive pulmonary disease, unspecified: Secondary | ICD-10-CM | POA: Diagnosis not present

## 2016-02-11 NOTE — Progress Notes (Signed)
He presents today with chief complaint of painful elongated toenails.  Objective: Toenails are thick yellow dystrophic with mycotic painful palpation. Pulses remain palpable no open lesions or wounds area  Assessment: Pain in limb secondary to onychomycosis.  Plan: Debridement of toenails 1 through 5 bilateral.

## 2016-02-13 DIAGNOSIS — J441 Chronic obstructive pulmonary disease with (acute) exacerbation: Secondary | ICD-10-CM | POA: Diagnosis not present

## 2016-02-13 DIAGNOSIS — J449 Chronic obstructive pulmonary disease, unspecified: Secondary | ICD-10-CM | POA: Diagnosis not present

## 2016-02-13 DIAGNOSIS — J9611 Chronic respiratory failure with hypoxia: Secondary | ICD-10-CM | POA: Diagnosis not present

## 2016-02-22 DIAGNOSIS — J441 Chronic obstructive pulmonary disease with (acute) exacerbation: Secondary | ICD-10-CM | POA: Diagnosis not present

## 2016-02-28 ENCOUNTER — Other Ambulatory Visit: Payer: Self-pay

## 2016-02-28 NOTE — Patient Outreach (Addendum)
[facility]) Care Management  [place]  02/28/2016   DELOYD BRUNKE [birth year] [id number]  Subjective: Telephone call to caregiver/wife for initial assessment.  Wife reports that patient is doing good but still has some congestion and wheezing.  She reports patient saw primary doctor on last week and antibiotic was ordered for 5 days.  Advised wife if she feels congestion and wheezing not better to call office back.  Discussed with her signs and symptoms of COPD exacerbation and when to notify physician.  She verbalized understanding.     Objective:   Encounter Medications:  Outpatient Encounter Prescriptions as of 02/28/2016  Medication Sig Note  . acetaminophen (TYLENOL) 500 MG tablet Take 1,000 mg by mouth every 6 (six) hours as needed for headache.   Marland Kitchen aspirin 325 MG tablet Take 325 mg by mouth daily.     Marland Kitchen atorvastatin (LIPITOR) 40 MG tablet Take 1 tablet (40 mg total) by mouth daily.   . calcitRIOL (ROCALTROL) 0.5 MCG capsule Take 0.5 mcg by mouth daily.   . cetirizine (ZYRTEC) 10 MG tablet Take 10 mg by mouth daily as needed for allergies.   . Cholecalciferol (VITAMIN D3) 5000 UNITS TABS Take 5,000 Units by mouth every other day. Reported on 09/30/2015   . donepezil (ARICEPT) 10 MG tablet Take 1 tablet (10 mg total) by mouth at bedtime.   . feeding supplement, ENSURE ENLIVE, (ENSURE ENLIVE) LIQD Take 237 mLs by mouth 2 (two) times daily between meals.   . fenofibrate 160 MG tablet Take 1 tablet (160 mg total) by mouth daily.   . ferrous sulfate 325 (65 FE) MG tablet Take 325 mg by mouth every morning.    . finasteride (PROSCAR) 5 MG tablet Take 5 mg by mouth every morning.    . fluticasone (FLONASE) 50 MCG/ACT nasal spray Place 2 sprays into both nostrils daily as needed for allergies.    Marland Kitchen gabapentin (NEURONTIN) 300 MG capsule Take 1 capsule (300 mg total) by mouth 3 (three) times daily. (Patient taking differently: Take 300 mg by mouth 2 (two) times daily. )    . guaiFENesin (MUCUS RELIEF CHEST CONGESTION) 100 MG/5ML liquid Take 200 mg by mouth 3 (three) times daily as needed for cough. Reported on 01/25/2016   . guaiFENesin-dextromethorphan (ROBITUSSIN DM) 100-10 MG/5ML syrup Take 5 mLs by mouth every 4 (four) hours as needed for cough.   Marland Kitchen ipratropium (ATROVENT) 0.06 % nasal spray Place 1 spray into both nostrils 4 (four) times daily.   Marland Kitchen ipratropium-albuterol (DUONEB) 0.5-2.5 (3) MG/3ML SOLN Take 3 mLs by nebulization every 4 (four) hours as needed.   . lansoprazole (PREVACID) 30 MG capsule Take 30 mg by mouth daily.  12/16/2015: Had to miss appointment due to hospitalization, rescheduling   . latanoprost (XALATAN) 0.005 % ophthalmic solution Place 1 drop into both eyes at bedtime.    Marland Kitchen lubiprostone (AMITIZA) 24 MCG capsule Take 24 mcg by mouth every evening.    . memantine (NAMENDA) 10 MG tablet Take 1 tablet (10 mg total) by mouth 2 (two) times daily.   . metoCLOPramide (REGLAN) 10 MG tablet Take 10 mg by mouth 2 (two) times daily. Reported on 09/30/2015   . metoprolol succinate (TOPROL-XL) 25 MG 24 hr tablet Take 1 tablet (25 mg total) by mouth daily.   . mirtazapine (REMERON) 30 MG tablet Take 30 mg by mouth at bedtime.   . Multiple Vitamin (MULTIVITAMIN WITH MINERALS) TABS tablet Take 1 tablet by mouth daily.   Marland Kitchen  nitroGLYCERIN (NITROSTAT) 0.4 MG SL tablet Place 1 tablet (0.4 mg total) under the tongue every 5 (five) minutes as needed for chest pain.   Marland Kitchen nystatin (MYCOSTATIN) 100000 UNIT/ML suspension Use as directed 5 mLs in the mouth or throat 4 (four) times daily. Reported on 01/06/2016 12/13/2015: Received from: External Pharmacy  . ondansetron (ZOFRAN) 4 MG tablet Take 4 mg by mouth every 8 (eight) hours as needed for nausea or vomiting. Reported on 12/02/2015   . OXYGEN Inhale 2 L into the lungs at bedtime. 2lpm with sleep   . polyethylene glycol (MIRALAX / GLYCOLAX) packet Take 17 g by mouth daily as needed for moderate constipation.    .  Tamsulosin HCl (FLOMAX) 0.4 MG CAPS Take 0.8 mg by mouth at bedtime.    Marland Kitchen tiZANidine (ZANAFLEX) 2 MG tablet Take 2 mg by mouth daily as needed. For muscle spasms 12/13/2015: Received from: External Pharmacy Received Sig:   . vitamin B-12 (CYANOCOBALAMIN) 1000 MCG tablet Take 1,000 mcg by mouth daily.    No facility-administered encounter medications on file as of 02/28/2016.     Functional Status:  In your present state of health, do you have any difficulty performing the following activities: 02/28/2016 01/26/2016  Hearing? N -  Vision? Y -  Difficulty concentrating or making decisions? Y -  Walking or climbing stairs? Y -  Dressing or bathing? Y -  Doing errands, shopping? Y -  Conservation officer, nature and eating ? Y -  Using the Toilet? N -  In the past six months, have you accidently leaked urine? Y -  Do you have problems with loss of bowel control? Y (No Data)  Managing your Medications? Y -  Managing your Finances? Y -  Housekeeping or managing your Housekeeping? Y -  Some recent data might be hidden    Fall/Depression Screening: PHQ 2/9 Scores 02/28/2016 01/13/2016 12/16/2015 09/30/2015 09/27/2015  PHQ - 2 Score 0 - - 0 -  Exception Documentation - Medical reason Medical reason - Medical reason    Assessment: Patient/caregiver will benefit from health coach outreach for education and support for continued disease management.  Plan:  [facility] CM Care Plan Problem One   Flowsheet Row Most Recent Value  Care Plan Problem One  COPD knowledge deficit  Role Documenting the Problem One  Health Coach  Care Plan for Problem One  Active  [facility] Long Term Goal (31-90 days)  Caregiver will be able to verbalize action plan for COPD exacerbation in the next 90 days  [facility] Long Term Goal Start Date  02/28/16 [goal continued]  Interventions for Problem One Long Term Goal  RN Health Coach discussed with caregiver signs and symptoms of COPD and when to notify physician.        RN Health Coach will provide ongoing  education for patient/caregiver on COPD.  RN Health Coach sent welcome letter.  RN Health Coach will send initial barriers letter, assessment, and care plan to primary care physician.  RN Health Coach will contact patient/caregiver in the month of September and patient/caregiver agrees to next contact.   [name], RN, MSN [place] [phone]

## 2016-02-29 ENCOUNTER — Other Ambulatory Visit: Payer: Self-pay | Admitting: Cardiology

## 2016-03-04 DIAGNOSIS — R0600 Dyspnea, unspecified: Secondary | ICD-10-CM | POA: Diagnosis not present

## 2016-03-04 DIAGNOSIS — I739 Peripheral vascular disease, unspecified: Secondary | ICD-10-CM | POA: Diagnosis not present

## 2016-03-04 DIAGNOSIS — I10 Essential (primary) hypertension: Secondary | ICD-10-CM | POA: Diagnosis not present

## 2016-03-04 DIAGNOSIS — J441 Chronic obstructive pulmonary disease with (acute) exacerbation: Secondary | ICD-10-CM | POA: Diagnosis not present

## 2016-03-11 ENCOUNTER — Encounter: Payer: Self-pay | Admitting: Cardiology

## 2016-03-11 NOTE — Progress Notes (Signed)
r  Cardiology Office Note    Date:  03/12/2016   ID:  [name] [name], DOB 12/12/1930, MRN [id number]  PCP:  Gara Kroner, MD  Cardiologist:  Fransico Him, MD   Chief Complaint  Patient presents with  . Coronary Artery Disease  . Hypertension  . Hyperlipidemia    History of Present Illness:  [name] [name] is a 82 y.o. male who presents for followup of his ASCAD. He has a history of ASCAD s/p CABG in 1997 with known atretic LIMA to LAD and 40% ostial LM, PVD s/p left iliac stent and renal artery stenosis s/p left renal artery stent, carotid artery stenosis s/p left CEA, dementia, HTN, CKD, noncardiac chest wall pain, dyslipidemia and COPD. Nuclear stress test in 2013 showed no ischemia.He has moderate pulmonary HTN from COPD. He has chronic chest wall pain that never goes away.  He denies any anginal chest pain.  He has chronic DOE from his COPD which recently is worse. He has had some problems with wheezing and SOB and was seen by Pulmonary and was started on antibx without significant improvement and then was started on a steroid taper.  He thinks the steroids have helped but he still has some wheezing.   He occasionally has some LE edema at night.  He denies any palpitations, dizziness or syncope.      Past Medical History:  Diagnosis Date  . Alzheimer disease   . Anemia   . BPH (benign prostatic hyperplasia)   . CAD (coronary artery disease)    s/p CABG in 1997 with known atretic LIMA to LAD and 40% LM.    Marland Kitchen Carotid artery stenosis    s/p left CEA.  Carotid dopplers 09/2013 60-79% right ICA stenosis and 1-39% left ICA stenosis of left CEA  . Chronic chest wall pain   . Chronic kidney disease (CKD), stage III (moderate)   . COPD (chronic obstructive pulmonary disease) ([place])   . Cough    cough with productive white to yellow sputum.  . DEMENTIA    "short term memory issues"  . Depression with anxiety   . GERD (gastroesophageal reflux disease)   . Glaucoma    bilateral  .  Hyperlipemia   . Hypertension   . Memory loss   . Monoclonal gammopathy   . Neuromuscular disorder (HCC)    fingertips numbness"  . On home oxygen therapy    at bedtime-2 L/m nasally..    . Peptic ulcer disease   . PVD (peripheral vascular disease) ([place])    s/p left renal artery stent and left iliac stent  . Retinal artery occlusion     Past Surgical History:  Procedure Laterality Date  . BACK SURGERY  2011   lower back -disc  . CARDIAC SURGERY    . CATARACT EXTRACTION W/PHACO  09/18/2011   Procedure: CATARACT EXTRACTION PHACO AND INTRAOCULAR LENS PLACEMENT (IOC);  Surgeon: Elta Guadeloupe T. Gershon Crane, MD;  Location: [facility];  Service: Ophthalmology;  Laterality: Right;  CDE: 6.19  . COLONOSCOPY WITH PROPOFOL N/A 07/11/2015   Procedure: COLONOSCOPY WITH PROPOFOL;  Surgeon: Laurence Spates, MD;  Location: [facility] ENDOSCOPY;  Service: Endoscopy;  Laterality: N/A;  . CORONARY ARTERY BYPASS GRAFT  1997   "nerve pain at area"  . ESOPHAGOGASTRODUODENOSCOPY (EGD) WITH PROPOFOL N/A 07/11/2015   Procedure: ESOPHAGOGASTRODUODENOSCOPY (EGD) WITH PROPOFOL;  Surgeon: Laurence Spates, MD;  Location: [facility] ENDOSCOPY;  Service: Endoscopy;  Laterality: N/A;  . Left CEA  2002   carotid stent-left-   .  RENAL ARTERY STENT      Current Medications: Outpatient Medications Prior to Visit  Medication Sig Dispense Refill  . acetaminophen (TYLENOL) 500 MG tablet Take 1,000 mg by mouth every 6 (six) hours as needed for headache.    Marland Kitchen aspirin 325 MG tablet Take 325 mg by mouth daily.      Marland Kitchen atorvastatin (LIPITOR) 40 MG tablet Take 1 tablet (40 mg total) by mouth daily. 90 tablet 3  . calcitRIOL (ROCALTROL) 0.5 MCG capsule Take 0.5 mcg by mouth daily.    . cetirizine (ZYRTEC) 10 MG tablet Take 10 mg by mouth daily as needed for allergies.    . Cholecalciferol (VITAMIN D3) 5000 UNITS TABS Take 5,000 Units by mouth every other day. Reported on 09/30/2015    . donepezil (ARICEPT) 10 MG tablet Take 1 tablet (10 mg total) by mouth at  bedtime. 90 tablet 1  . feeding supplement, ENSURE ENLIVE, (ENSURE ENLIVE) LIQD Take 237 mLs by mouth 2 (two) times daily between meals. 30 Bottle 6  . fenofibrate 160 MG tablet Take 1 tablet (160 mg total) by mouth daily. 30 tablet 11  . ferrous sulfate 325 (65 FE) MG tablet Take 325 mg by mouth every morning.     . finasteride (PROSCAR) 5 MG tablet Take 5 mg by mouth every morning.     . fluticasone (FLONASE) 50 MCG/ACT nasal spray Place 2 sprays into both nostrils daily as needed for allergies.     Marland Kitchen gabapentin (NEURONTIN) 300 MG capsule Take 1 capsule (300 mg total) by mouth 3 (three) times daily. (Patient taking differently: Take 300 mg by mouth 2 (two) times daily. ) 270 capsule 3  . guaiFENesin (MUCUS RELIEF CHEST CONGESTION) 100 MG/5ML liquid Take 200 mg by mouth 3 (three) times daily as needed for cough. Reported on 01/25/2016    . guaiFENesin-dextromethorphan (ROBITUSSIN DM) 100-10 MG/5ML syrup Take 5 mLs by mouth every 4 (four) hours as needed for cough. 118 mL 0  . ipratropium (ATROVENT) 0.06 % nasal spray Place 1 spray into both nostrils 4 (four) times daily.    Marland Kitchen ipratropium-albuterol (DUONEB) 0.5-2.5 (3) MG/3ML SOLN Take 3 mLs by nebulization every 4 (four) hours as needed. 360 mL 11  . lansoprazole (PREVACID) 30 MG capsule Take 30 mg by mouth daily.     Marland Kitchen latanoprost (XALATAN) 0.005 % ophthalmic solution Place 1 drop into both eyes at bedtime.     Marland Kitchen lubiprostone (AMITIZA) 24 MCG capsule Take 24 mcg by mouth every evening.     . memantine (NAMENDA) 10 MG tablet Take 1 tablet (10 mg total) by mouth 2 (two) times daily. 180 tablet 3  . metoCLOPramide (REGLAN) 10 MG tablet Take 10 mg by mouth 2 (two) times daily. Reported on 09/30/2015    . metoprolol succinate (TOPROL-XL) 25 MG 24 hr tablet Take 1 tablet (25 mg total) by mouth daily. 90 tablet 0  . mirtazapine (REMERON) 30 MG tablet Take 30 mg by mouth at bedtime.    . Multiple Vitamin (MULTIVITAMIN WITH MINERALS) TABS tablet Take 1  tablet by mouth daily.    . nitroGLYCERIN (NITROSTAT) 0.4 MG SL tablet Place 1 tablet (0.4 mg total) under the tongue every 5 (five) minutes as needed for chest pain. 25 tablet 11  . nystatin (MYCOSTATIN) 100000 UNIT/ML suspension Use as directed 5 mLs in the mouth or throat 4 (four) times daily. Reported on 01/06/2016    . ondansetron (ZOFRAN) 4 MG tablet Take 4 mg by mouth every  8 (eight) hours as needed for nausea or vomiting. Reported on 12/02/2015    . OXYGEN Inhale 2 L into the lungs at bedtime. 2lpm with sleep    . polyethylene glycol (MIRALAX / GLYCOLAX) packet Take 17 g by mouth daily as needed for moderate constipation.     . Tamsulosin HCl (FLOMAX) 0.4 MG CAPS Take 0.8 mg by mouth at bedtime.     Marland Kitchen tiZANidine (ZANAFLEX) 2 MG tablet Take 2 mg by mouth daily as needed. For muscle spasms    . vitamin B-12 (CYANOCOBALAMIN) 1000 MCG tablet Take 1,000 mcg by mouth daily.     No facility-administered medications prior to visit.      Allergies:   Review of patient's allergies indicates no known allergies.   Social History   Social History  . Marital status: Married    Spouse name: Peter Congo  . Number of children: 8  . Years of education: 46   Occupational History  .  Retired    Retired   Social History Main Topics  . Smoking status: Former Smoker    Packs/day: 0.50    Years: 40.00    Types: Cigarettes    Quit date: 07/23/1978  . Smokeless tobacco: Never Used  . Alcohol use No  . Drug use: No  . Sexual activity: Not Asked   Other Topics Concern  . None   Social History Narrative   Patient lives at home with his wife. Peter Congo). Patient is retired. Patient has one Year of college.   Right handed.   Caffeine- sometimes     Family History:  The patient's family history includes Hypertension in his father and mother.   ROS:   Please see the history of present illness.    Review of Systems  Respiratory: Positive for cough, shortness of breath and wheezing.   Musculoskeletal:  Positive for back pain.   All other systems reviewed and are negative.   PHYSICAL EXAM:   VS:  BP 110/62   Pulse 61   Ht 5\' 7"  (1.702 m)   Wt 170 lb 1.9 oz (77.2 kg)   SpO2 92%   BMI 26.64 kg/m    GEN: Well nourished, well developed, in no acute distress  HEENT: normal  Neck: no JVD, carotid bruits, or masses Cardiac: RRR; no murmurs, rubs, or gallops,no edema.  Intact distal pulses bilaterally.  Respiratory:  clear to auscultation bilaterally, normal work of breathing GI: soft, nontender, nondistended, + BS MS: no deformity or atrophy  Skin: warm and dry, no rash Neuro:  Alert and Oriented x 3, Strength and sensation are intact Psych: euthymic mood, full affect  Wt Readings from Last 3 Encounters:  03/12/16 170 lb 1.9 oz (77.2 kg)  02/02/16 174 lb (78.9 kg)  01/13/16 170 lb (77.1 kg)      Studies/Labs Reviewed:   EKG:  EKG is not ordered today.    Recent Labs: 12/13/2015: Hemoglobin 12.3; Platelets 199 12/15/2015: BUN 20; Creatinine, Ser 1.73; Potassium 4.2; Sodium 137 01/27/2016: ALT 28   Lipid Panel    Component Value Date/Time   CHOL 139 01/27/2016 1032   TRIG 254 (H) 01/27/2016 1032   HDL 28 (L) 01/27/2016 1032   CHOLHDL 5.0 01/27/2016 1032   VLDL 51 (H) 01/27/2016 1032   LDLCALC 60 01/27/2016 1032   LDLDIRECT 110.0 12/09/2014 1018    Additional studies/ records that were reviewed today include:  none    ASSESSMENT:    1. Coronary artery disease due to lipid  rich plaque   2. Carotid stenosis, asymptomatic, bilateral   3. Hypertension, essential   4. Dyslipidemia   5. SOB (shortness of breath)      PLAN:  In order of problems listed above:  1. ASCAD s/p CABG in 1997 with known atretic LIMA to LAD and 40% LM.  He has no anginal symptoms.  Continue ASA/statin/BB. 2. Carotid artery stenosis - dopplers 05/2015 showed  60-79% RICA stenosis. Stable 123456 LICA stenosis, S/P CEA with DPA. Elevated left subclavian artery velocities. Patent vertebral  arteries with antegrade flow. Followup study 05/2016.  Continue statin/ASA. 3. HTN - BP controlled.  Continue BB. 4.   Hyperlipidemia- LDL goal < 70.  Continue statin.  He was started on fenofibrate last month so will check FLP in the next week. 5.   SOB - I suspect this is primarily due to COPD but I will check a BNP to make sure there is no component of volume overload.   Medication Adjustments/Labs and Tests Ordered: Current medicines are reviewed at length with the patient today.  Concerns regarding medicines are outlined above.  Medication changes, Labs and Tests ordered today are listed in the Patient Instructions below.  There are no Patient Instructions on file for this visit.   Signed, Fransico Him, MD  03/12/2016 12:47 PM    [address], [place], [place]  [address] Phone: [phone]; Fax: [phone]

## 2016-03-12 ENCOUNTER — Other Ambulatory Visit: Payer: Self-pay | Admitting: Cardiology

## 2016-03-12 ENCOUNTER — Other Ambulatory Visit: Payer: Self-pay

## 2016-03-12 ENCOUNTER — Ambulatory Visit (INDEPENDENT_AMBULATORY_CARE_PROVIDER_SITE_OTHER): Payer: Commercial Managed Care - HMO | Admitting: Cardiology

## 2016-03-12 ENCOUNTER — Encounter: Payer: Self-pay | Admitting: Cardiology

## 2016-03-12 VITALS — BP 110/62 | HR 61 | Ht 67.0 in | Wt 170.1 lb

## 2016-03-12 DIAGNOSIS — I2510 Atherosclerotic heart disease of native coronary artery without angina pectoris: Secondary | ICD-10-CM | POA: Diagnosis not present

## 2016-03-12 DIAGNOSIS — I6523 Occlusion and stenosis of bilateral carotid arteries: Secondary | ICD-10-CM | POA: Diagnosis not present

## 2016-03-12 DIAGNOSIS — I2583 Coronary atherosclerosis due to lipid rich plaque: Principal | ICD-10-CM

## 2016-03-12 DIAGNOSIS — R0602 Shortness of breath: Secondary | ICD-10-CM

## 2016-03-12 DIAGNOSIS — E785 Hyperlipidemia, unspecified: Secondary | ICD-10-CM

## 2016-03-12 DIAGNOSIS — I10 Essential (primary) hypertension: Secondary | ICD-10-CM | POA: Diagnosis not present

## 2016-03-12 DIAGNOSIS — I6529 Occlusion and stenosis of unspecified carotid artery: Secondary | ICD-10-CM

## 2016-03-12 LAB — BRAIN NATRIURETIC PEPTIDE: BRAIN NATRIURETIC PEPTIDE: 149.1 pg/mL — AB (ref <100)

## 2016-03-12 MED ORDER — ASPIRIN 81 MG PO TABS
81.0000 mg | ORAL_TABLET | Freq: Every day | ORAL | Status: AC
Start: 2016-03-12 — End: ?

## 2016-03-12 NOTE — Patient Instructions (Signed)
Medication Instructions:  1) DECREASE ASPIRIN to 81 mg daily  Labwork: TODAY: BNP  Testing/Procedures: Your physician has requested that you have a carotid duplex. This test is an ultrasound of the carotid arteries in your neck. It looks at blood flow through these arteries that supply the brain with blood. Allow one hour for this exam. There are no restrictions or special instructions.  Follow-Up: Your physician wants you to follow-up in: 6 months with Dr. Radford Pax. You will receive a reminder letter in the mail two months in advance. If you don't receive a letter, please call our office to schedule the follow-up appointment.   Any Other Special Instructions Will Be Listed Below (If Applicable).     If you need a refill on your cardiac medications before your next appointment, please call your pharmacy.

## 2016-03-14 ENCOUNTER — Other Ambulatory Visit: Payer: Self-pay

## 2016-03-14 ENCOUNTER — Telehealth: Payer: Self-pay

## 2016-03-14 DIAGNOSIS — I6529 Occlusion and stenosis of unspecified carotid artery: Secondary | ICD-10-CM

## 2016-03-14 DIAGNOSIS — I10 Essential (primary) hypertension: Secondary | ICD-10-CM

## 2016-03-14 NOTE — Telephone Encounter (Signed)
Informed patient's DPR of results and verbal understanding expressed.   BMET unable to be added to BNP. Patient to come next Friday for blood work as he cannot get a ride prior. DPR agrees with treatment plan.

## 2016-03-14 NOTE — Telephone Encounter (Signed)
-----   Message from Sueanne Margarita, MD sent at 03/12/2016  7:02 PM EDT ----- Mildly elevated BNP - please find out if lab can run a BMET on blood from today

## 2016-03-15 ENCOUNTER — Telehealth: Payer: Self-pay | Admitting: Cardiology

## 2016-03-15 DIAGNOSIS — J449 Chronic obstructive pulmonary disease, unspecified: Secondary | ICD-10-CM | POA: Diagnosis not present

## 2016-03-15 DIAGNOSIS — J9611 Chronic respiratory failure with hypoxia: Secondary | ICD-10-CM | POA: Diagnosis not present

## 2016-03-15 DIAGNOSIS — J441 Chronic obstructive pulmonary disease with (acute) exacerbation: Secondary | ICD-10-CM | POA: Diagnosis not present

## 2016-03-15 NOTE — Telephone Encounter (Signed)
error 

## 2016-03-16 ENCOUNTER — Other Ambulatory Visit: Payer: Commercial Managed Care - HMO | Admitting: *Deleted

## 2016-03-16 DIAGNOSIS — I10 Essential (primary) hypertension: Secondary | ICD-10-CM

## 2016-03-16 LAB — BASIC METABOLIC PANEL
BUN: 29 mg/dL — ABNORMAL HIGH (ref 7–25)
CHLORIDE: 107 mmol/L (ref 98–110)
CO2: 24 mmol/L (ref 20–31)
Calcium: 9.7 mg/dL (ref 8.6–10.3)
Creat: 2.65 mg/dL — ABNORMAL HIGH (ref 0.70–1.11)
GLUCOSE: 84 mg/dL (ref 65–99)
POTASSIUM: 4.8 mmol/L (ref 3.5–5.3)
SODIUM: 140 mmol/L (ref 135–146)

## 2016-03-16 NOTE — Addendum Note (Signed)
Addended by: Eulis Foster on: 03/16/2016 10:11 AM   Modules accepted: Orders

## 2016-03-19 ENCOUNTER — Ambulatory Visit (HOSPITAL_COMMUNITY)
Admission: RE | Admit: 2016-03-19 | Discharge: 2016-03-19 | Disposition: A | Discharge status: Home / Self Care | Payer: Commercial Managed Care - HMO | Source: Ambulatory Visit | Attending: Cardiology | Admitting: Cardiology

## 2016-03-19 DIAGNOSIS — I6529 Occlusion and stenosis of unspecified carotid artery: Secondary | ICD-10-CM

## 2016-03-19 DIAGNOSIS — I6523 Occlusion and stenosis of bilateral carotid arteries: Secondary | ICD-10-CM | POA: Diagnosis not present

## 2016-03-19 DIAGNOSIS — I6521 Occlusion and stenosis of right carotid artery: Secondary | ICD-10-CM | POA: Diagnosis not present

## 2016-03-20 ENCOUNTER — Telehealth: Payer: Self-pay | Admitting: Cardiology

## 2016-03-20 NOTE — Telephone Encounter (Signed)
New Message  Pts wife voiced she's calling about blood work, ultrasound and medications he's taking.  Please follow up with pt. Thanks!

## 2016-03-20 NOTE — Telephone Encounter (Signed)
Followup with Dr. Donnetta Hutching instead of Gwenlyn Found or [place]

## 2016-03-20 NOTE — Telephone Encounter (Signed)
Patient's wife [facility]) is calling for results of labs and his carotid. Informed patient that lab work was forward to his PCP, and patient will be seeing his PCP tomorrow. Per Dr. Radford Pax, >70% right ICA stenosis and s/p left CEA with increased velocities- please refer to Dr.Berrry or Dr. Fletcher Anon. Continue ASA and statin, recent LDL at goal. Patient's wife stated patient is already seeing Dr. Donnetta Hutching in November with vascular follow-up. Will forward to Dr. Radford Pax to see if patient needs referral to Dr. Gwenlyn Found or Dr. Fletcher Anon.

## 2016-03-21 NOTE — Telephone Encounter (Signed)
Informed patient's wife that results will be sent to Dr. Donnetta Hutching for follow-up. She was grateful for call.

## 2016-03-22 DIAGNOSIS — D649 Anemia, unspecified: Secondary | ICD-10-CM | POA: Diagnosis not present

## 2016-03-22 DIAGNOSIS — K219 Gastro-esophageal reflux disease without esophagitis: Secondary | ICD-10-CM | POA: Diagnosis not present

## 2016-03-22 DIAGNOSIS — J449 Chronic obstructive pulmonary disease, unspecified: Secondary | ICD-10-CM | POA: Diagnosis not present

## 2016-03-22 DIAGNOSIS — N183 Chronic kidney disease, stage 3 (moderate): Secondary | ICD-10-CM | POA: Diagnosis not present

## 2016-03-22 DIAGNOSIS — G629 Polyneuropathy, unspecified: Secondary | ICD-10-CM | POA: Diagnosis not present

## 2016-03-22 DIAGNOSIS — K5900 Constipation, unspecified: Secondary | ICD-10-CM | POA: Diagnosis not present

## 2016-03-22 DIAGNOSIS — I10 Essential (primary) hypertension: Secondary | ICD-10-CM | POA: Diagnosis not present

## 2016-03-22 DIAGNOSIS — Z23 Encounter for immunization: Secondary | ICD-10-CM | POA: Diagnosis not present

## 2016-03-22 DIAGNOSIS — J309 Allergic rhinitis, unspecified: Secondary | ICD-10-CM | POA: Diagnosis not present

## 2016-03-23 ENCOUNTER — Other Ambulatory Visit: Payer: Commercial Managed Care - HMO

## 2016-03-23 ENCOUNTER — Other Ambulatory Visit: Payer: Commercial Managed Care - HMO | Admitting: *Deleted

## 2016-03-23 DIAGNOSIS — E785 Hyperlipidemia, unspecified: Secondary | ICD-10-CM | POA: Diagnosis not present

## 2016-03-23 LAB — LIPID PANEL
CHOL/HDL RATIO: 4.9 ratio (ref <=5.0)
CHOLESTEROL: 176 mg/dL (ref 125–200)
HDL: 36 mg/dL — AB (ref >=40)
LDL Cholesterol: 101 mg/dL (ref <130)
TRIGLYCERIDES: 197 mg/dL — AB (ref <150)
VLDL: 39 mg/dL — AB (ref <30)

## 2016-03-23 LAB — ALT: ALT: 15 U/L (ref 9–46)

## 2016-03-28 ENCOUNTER — Telehealth: Payer: Self-pay

## 2016-03-28 DIAGNOSIS — E785 Hyperlipidemia, unspecified: Secondary | ICD-10-CM

## 2016-03-28 MED ORDER — ATORVASTATIN CALCIUM 80 MG PO TABS: 80.0000 mg | ORAL_TABLET | Freq: Every day | ORAL | 3 refills | Status: DC

## 2016-03-28 NOTE — Telephone Encounter (Signed)
Informed patient's DPR of results and verbal understanding expressed.  Instructed her to have patient INCREASE LIPITOR to 80 mg daily. FLP and ALT scheduled 10/20. DPR agrees with treatment plan.

## 2016-03-28 NOTE — Telephone Encounter (Signed)
-----   Message from Sueanne Margarita, MD sent at 03/24/2016  4:16 PM EDT ----- TAGS improved on fenofibrate but LDL not at goal (101).  Increase Lipitor to 80mg  daily and repeat FLP and ALT in 6 weeks

## 2016-03-29 ENCOUNTER — Other Ambulatory Visit: Payer: Self-pay

## 2016-03-29 NOTE — Patient Outreach (Signed)
[facility]) Care Management  [place]  03/29/2016   [name] [name] 06/26/1931 [id number]  Subjective: Telephone call to caregiver.  She reports that patient is having some issues with his kidney labs and is due to repeat labs again next week. She reports that she is trying to get patient to drink more water as instructed before his testing. She also reports has been very funny with his eating and sometimes wonders what she should give him. Advised her to make a note of things he likes to eat and give him those as she does not want him to lose weight.  She verbalized understanding.  She reports that patient is now on the nebulizer as patient could not do the inhalers.  She states that he has had no problems with his breathing and that his swallowing is better as well.  Discussed with wife COPD action plan and when to notify physician. She verbalized understanding.    Objective:   Encounter Medications:  Outpatient Encounter Prescriptions as of 03/29/2016  Medication Sig Note  . acetaminophen (TYLENOL) 500 MG tablet Take 1,000 mg by mouth every 6 (six) hours as needed for headache.   Marland Kitchen aspirin 81 MG tablet Take 1 tablet (81 mg total) by mouth daily.   Marland Kitchen atorvastatin (LIPITOR) 80 MG tablet Take 1 tablet (80 mg total) by mouth daily.   . calcitRIOL (ROCALTROL) 0.5 MCG capsule Take 0.5 mcg by mouth daily.   . cetirizine (ZYRTEC) 10 MG tablet Take 10 mg by mouth daily as needed for allergies.   . Cholecalciferol (VITAMIN D3) 5000 UNITS TABS Take 5,000 Units by mouth every other day. Reported on 09/30/2015   . donepezil (ARICEPT) 10 MG tablet Take 1 tablet (10 mg total) by mouth at bedtime.   . feeding supplement, ENSURE ENLIVE, (ENSURE ENLIVE) LIQD Take 237 mLs by mouth 2 (two) times daily between meals.   . fenofibrate 160 MG tablet Take 1 tablet (160 mg total) by mouth daily.   . ferrous sulfate 325 (65 FE) MG tablet Take 325 mg by mouth every morning.    . finasteride  (PROSCAR) 5 MG tablet Take 5 mg by mouth every morning.    . fluticasone (FLONASE) 50 MCG/ACT nasal spray Place 2 sprays into both nostrils daily as needed for allergies.    Marland Kitchen gabapentin (NEURONTIN) 300 MG capsule Take 1 capsule (300 mg total) by mouth 3 (three) times daily. (Patient taking differently: Take 300 mg by mouth 2 (two) times daily. )   . guaiFENesin (MUCUS RELIEF CHEST CONGESTION) 100 MG/5ML liquid Take 200 mg by mouth 3 (three) times daily as needed for cough. Reported on 01/25/2016   . guaiFENesin-dextromethorphan (ROBITUSSIN DM) 100-10 MG/5ML syrup Take 5 mLs by mouth every 4 (four) hours as needed for cough.   Marland Kitchen ipratropium (ATROVENT) 0.06 % nasal spray Place 1 spray into both nostrils 4 (four) times daily.   Marland Kitchen ipratropium-albuterol (DUONEB) 0.5-2.5 (3) MG/3ML SOLN Take 3 mLs by nebulization every 4 (four) hours as needed.   . lansoprazole (PREVACID) 30 MG capsule Take 30 mg by mouth daily.    Marland Kitchen latanoprost (XALATAN) 0.005 % ophthalmic solution Place 1 drop into both eyes at bedtime.    Marland Kitchen lubiprostone (AMITIZA) 24 MCG capsule Take 24 mcg by mouth every evening.    . memantine (NAMENDA) 10 MG tablet Take 1 tablet (10 mg total) by mouth 2 (two) times daily.   . metoCLOPramide (REGLAN) 10 MG tablet Take 10 mg  by mouth 2 (two) times daily. Reported on 09/30/2015   . metoprolol succinate (TOPROL-XL) 25 MG 24 hr tablet Take 1 tablet (25 mg total) by mouth daily.   . mirtazapine (REMERON) 30 MG tablet Take 30 mg by mouth at bedtime.   . Multiple Vitamin (MULTIVITAMIN WITH MINERALS) TABS tablet Take 1 tablet by mouth daily.   . nitroGLYCERIN (NITROSTAT) 0.4 MG SL tablet Place 1 tablet (0.4 mg total) under the tongue every 5 (five) minutes as needed for chest pain.   Marland Kitchen nystatin (MYCOSTATIN) 100000 UNIT/ML suspension Use as directed 5 mLs in the mouth or throat 4 (four) times daily. Reported on 01/06/2016 12/13/2015: Received from: External Pharmacy  . ondansetron (ZOFRAN) 4 MG tablet Take 4 mg by  mouth every 8 (eight) hours as needed for nausea or vomiting. Reported on 12/02/2015   . OXYGEN Inhale 2 L into the lungs at bedtime. 2lpm with sleep   . polyethylene glycol (MIRALAX / GLYCOLAX) packet Take 17 g by mouth daily as needed for moderate constipation.    . Tamsulosin HCl (FLOMAX) 0.4 MG CAPS Take 0.8 mg by mouth at bedtime.    Marland Kitchen tiZANidine (ZANAFLEX) 2 MG tablet Take 2 mg by mouth daily as needed. For muscle spasms 12/13/2015: Received from: External Pharmacy Received Sig:   . vitamin B-12 (CYANOCOBALAMIN) 1000 MCG tablet Take 1,000 mcg by mouth daily.    No facility-administered encounter medications on file as of 03/29/2016.     Functional Status:  In your present state of health, do you have any difficulty performing the following activities: 02/28/2016 01/26/2016  Hearing? N -  Vision? Y -  Difficulty concentrating or making decisions? Y -  Walking or climbing stairs? Y -  Dressing or bathing? Y -  Doing errands, shopping? Y -  Conservation officer, nature and eating ? Y -  Using the Toilet? N -  In the past six months, have you accidently leaked urine? Y -  Do you have problems with loss of bowel control? Y (No Data)  Managing your Medications? Y -  Managing your Finances? Y -  Housekeeping or managing your Housekeeping? Y -  Some recent data might be hidden    Fall/Depression Screening: PHQ 2/9 Scores 03/29/2016 02/28/2016 01/13/2016 12/16/2015 09/30/2015 09/27/2015  PHQ - 2 Score 0 0 - - 0 -  Exception Documentation - - Medical reason Medical reason - Medical reason    Assessment: Patient continues to benefit from health coach outreach for disease management and support.    Plan:  [facility] CM Care Plan Problem One   Flowsheet Row Most Recent Value  Care Plan Problem One  COPD knowledge deficit  Role Documenting the Problem One  Health Coach  Care Plan for Problem One  Active  [facility] Long Term Goal (31-90 days)  Caregiver will be able to verbalize action plan for COPD exacerbation in the next  90 days  [facility] Long Term Goal Start Date  02/28/16 Barrie Folk continued]  Interventions for Problem One Long Term Goal  RN Health Coach reinforced with caregiver signs and symptoms of COPD and when to notify physician.       RN Health Coach will contact patient in the month of October and patient agrees to next outreach.  [name], RN, MSN [place] [phone]

## 2016-03-30 DIAGNOSIS — N183 Chronic kidney disease, stage 3 (moderate): Secondary | ICD-10-CM | POA: Diagnosis not present

## 2016-04-04 DIAGNOSIS — J441 Chronic obstructive pulmonary disease with (acute) exacerbation: Secondary | ICD-10-CM | POA: Diagnosis not present

## 2016-04-04 DIAGNOSIS — I10 Essential (primary) hypertension: Secondary | ICD-10-CM | POA: Diagnosis not present

## 2016-04-04 DIAGNOSIS — R0600 Dyspnea, unspecified: Secondary | ICD-10-CM | POA: Diagnosis not present

## 2016-04-04 DIAGNOSIS — I739 Peripheral vascular disease, unspecified: Secondary | ICD-10-CM | POA: Diagnosis not present

## 2016-04-11 DIAGNOSIS — N2581 Secondary hyperparathyroidism of renal origin: Secondary | ICD-10-CM | POA: Diagnosis not present

## 2016-04-11 DIAGNOSIS — N183 Chronic kidney disease, stage 3 (moderate): Secondary | ICD-10-CM | POA: Diagnosis not present

## 2016-04-15 DIAGNOSIS — J9611 Chronic respiratory failure with hypoxia: Secondary | ICD-10-CM | POA: Diagnosis not present

## 2016-04-15 DIAGNOSIS — J449 Chronic obstructive pulmonary disease, unspecified: Secondary | ICD-10-CM | POA: Diagnosis not present

## 2016-04-15 DIAGNOSIS — J441 Chronic obstructive pulmonary disease with (acute) exacerbation: Secondary | ICD-10-CM | POA: Diagnosis not present

## 2016-04-20 ENCOUNTER — Telehealth: Payer: Self-pay | Admitting: Neurology

## 2016-04-20 NOTE — Telephone Encounter (Signed)
Pt's wife request refill for donepezil (ARICEPT) 10 MG tablet 90 day supply sent [place]

## 2016-04-23 ENCOUNTER — Other Ambulatory Visit: Payer: Self-pay

## 2016-04-23 MED ORDER — DONEPEZIL HCL 10 MG PO TABS: 10.0000 mg | ORAL_TABLET | Freq: Every day | ORAL | 4 refills | Status: DC

## 2016-04-23 NOTE — Telephone Encounter (Signed)
Please let patient know I have refilled Aricept 10 mg daily for 90 days with 4 refills

## 2016-04-23 NOTE — Telephone Encounter (Signed)
Spoke to pt's wife - she is aware his medication has been refilled.

## 2016-04-23 NOTE — Patient Outreach (Signed)
[facility]) Care Management  [place]  04/23/2016   [name] [name] 03/12/1931 [id number]  Subjective: Telephone call to wife/caregiver for monthly call.  Wife reports that patient is doing ok.   She reports that his appetite is improved some. She reports his kidney function is still abnormal and that kidney doctor is monitoring levels. She reports that patient breathing is good. Discussed with wife COPD action plan and when to notify physician.  She verbalized understanding.   Objective:   Encounter Medications:  Outpatient Encounter Prescriptions as of 04/23/2016  Medication Sig Note  . acetaminophen (TYLENOL) 500 MG tablet Take 1,000 mg by mouth every 6 (six) hours as needed for headache.   Marland Kitchen aspirin 81 MG tablet Take 1 tablet (81 mg total) by mouth daily.   Marland Kitchen atorvastatin (LIPITOR) 80 MG tablet Take 1 tablet (80 mg total) by mouth daily.   . calcitRIOL (ROCALTROL) 0.5 MCG capsule Take 0.5 mcg by mouth daily.   . cetirizine (ZYRTEC) 10 MG tablet Take 10 mg by mouth daily as needed for allergies.   . Cholecalciferol (VITAMIN D3) 5000 UNITS TABS Take 5,000 Units by mouth every other day. Reported on 09/30/2015   . donepezil (ARICEPT) 10 MG tablet Take 1 tablet (10 mg total) by mouth at bedtime.   . fenofibrate 160 MG tablet Take 1 tablet (160 mg total) by mouth daily.   . ferrous sulfate 325 (65 FE) MG tablet Take 325 mg by mouth every morning.    . finasteride (PROSCAR) 5 MG tablet Take 5 mg by mouth every morning.    . fluticasone (FLONASE) 50 MCG/ACT nasal spray Place 2 sprays into both nostrils daily as needed for allergies.    Marland Kitchen gabapentin (NEURONTIN) 300 MG capsule Take 1 capsule (300 mg total) by mouth 3 (three) times daily. (Patient taking differently: Take 300 mg by mouth 2 (two) times daily. )   . guaiFENesin (MUCUS RELIEF CHEST CONGESTION) 100 MG/5ML liquid Take 200 mg by mouth 3 (three) times daily as needed for cough. Reported on 01/25/2016   .  guaiFENesin-dextromethorphan (ROBITUSSIN DM) 100-10 MG/5ML syrup Take 5 mLs by mouth every 4 (four) hours as needed for cough.   Marland Kitchen ipratropium (ATROVENT) 0.06 % nasal spray Place 1 spray into both nostrils 4 (four) times daily.   Marland Kitchen ipratropium-albuterol (DUONEB) 0.5-2.5 (3) MG/3ML SOLN Take 3 mLs by nebulization every 4 (four) hours as needed.   . lansoprazole (PREVACID) 30 MG capsule Take 30 mg by mouth daily.    Marland Kitchen latanoprost (XALATAN) 0.005 % ophthalmic solution Place 1 drop into both eyes at bedtime.    Marland Kitchen lubiprostone (AMITIZA) 24 MCG capsule Take 24 mcg by mouth every evening.    . memantine (NAMENDA) 10 MG tablet Take 1 tablet (10 mg total) by mouth 2 (two) times daily.   . metoCLOPramide (REGLAN) 10 MG tablet Take 10 mg by mouth 2 (two) times daily. Reported on 09/30/2015   . metoprolol succinate (TOPROL-XL) 25 MG 24 hr tablet Take 1 tablet (25 mg total) by mouth daily.   . mirtazapine (REMERON) 30 MG tablet Take 30 mg by mouth at bedtime.   . Multiple Vitamin (MULTIVITAMIN WITH MINERALS) TABS tablet Take 1 tablet by mouth daily.   . nitroGLYCERIN (NITROSTAT) 0.4 MG SL tablet Place 1 tablet (0.4 mg total) under the tongue every 5 (five) minutes as needed for chest pain.   Marland Kitchen nystatin (MYCOSTATIN) 100000 UNIT/ML suspension Use as directed 5 mLs in the mouth  or throat 4 (four) times daily. Reported on 01/06/2016 12/13/2015: Received from: External Pharmacy  . ondansetron (ZOFRAN) 4 MG tablet Take 4 mg by mouth every 8 (eight) hours as needed for nausea or vomiting. Reported on 12/02/2015   . OXYGEN Inhale 2 L into the lungs at bedtime. 2lpm with sleep   . polyethylene glycol (MIRALAX / GLYCOLAX) packet Take 17 g by mouth daily as needed for moderate constipation.    . Tamsulosin HCl (FLOMAX) 0.4 MG CAPS Take 0.8 mg by mouth at bedtime.    Marland Kitchen tiZANidine (ZANAFLEX) 2 MG tablet Take 2 mg by mouth daily as needed. For muscle spasms 12/13/2015: Received from: External Pharmacy Received Sig:   . vitamin  B-12 (CYANOCOBALAMIN) 1000 MCG tablet Take 1,000 mcg by mouth daily.   . feeding supplement, ENSURE ENLIVE, (ENSURE ENLIVE) LIQD Take 237 mLs by mouth 2 (two) times daily between meals.    No facility-administered encounter medications on file as of 04/23/2016.     Functional Status:  In your present state of health, do you have any difficulty performing the following activities: 02/28/2016 01/26/2016  Hearing? N -  Vision? Y -  Difficulty concentrating or making decisions? Y -  Walking or climbing stairs? Y -  Dressing or bathing? Y -  Doing errands, shopping? Y -  Conservation officer, nature and eating ? Y -  Using the Toilet? N -  In the past six months, have you accidently leaked urine? Y -  Do you have problems with loss of bowel control? Y (No Data)  Managing your Medications? Y -  Managing your Finances? Y -  Housekeeping or managing your Housekeeping? Y -  Some recent data might be hidden    Fall/Depression Screening: PHQ 2/9 Scores 04/23/2016 03/29/2016 02/28/2016 01/13/2016 12/16/2015 09/30/2015 09/27/2015  PHQ - 2 Score 0 0 0 - - 0 -  Exception Documentation - - - Medical reason Medical reason - Medical reason    Assessment: Patient continues to benefit from health coach outreach for disease management and support.    Plan:  [facility] CM Care Plan Problem One   Flowsheet Row Most Recent Value  Care Plan Problem One  COPD knowledge deficit  Role Documenting the Problem One  Health Coach  Care Plan for Problem One  Active  [facility] Long Term Goal (31-90 days)  Caregiver will be able to verbalize action plan for COPD exacerbation in the next 90 days  [facility] Long Term Goal Start Date  04/23/16 [goal continued]  Interventions for Problem One Long Term Goal  RN Health Coach discussed with caregiver signs and symptoms of COPD and when to notify physician.       RN Health Coach will contact patient in the month of November and patient agrees to next outreach.  [name], RN, MSN [place] [phone]

## 2016-04-26 DIAGNOSIS — N183 Chronic kidney disease, stage 3 (moderate): Secondary | ICD-10-CM | POA: Diagnosis not present

## 2016-04-26 DIAGNOSIS — N2581 Secondary hyperparathyroidism of renal origin: Secondary | ICD-10-CM | POA: Diagnosis not present

## 2016-04-26 DIAGNOSIS — R6889 Other general symptoms and signs: Secondary | ICD-10-CM | POA: Diagnosis not present

## 2016-04-26 DIAGNOSIS — I129 Hypertensive chronic kidney disease with stage 1 through stage 4 chronic kidney disease, or unspecified chronic kidney disease: Secondary | ICD-10-CM | POA: Diagnosis not present

## 2016-04-30 DIAGNOSIS — J209 Acute bronchitis, unspecified: Secondary | ICD-10-CM | POA: Diagnosis not present

## 2016-05-04 DIAGNOSIS — I739 Peripheral vascular disease, unspecified: Secondary | ICD-10-CM | POA: Diagnosis not present

## 2016-05-04 DIAGNOSIS — R0600 Dyspnea, unspecified: Secondary | ICD-10-CM | POA: Diagnosis not present

## 2016-05-04 DIAGNOSIS — J441 Chronic obstructive pulmonary disease with (acute) exacerbation: Secondary | ICD-10-CM | POA: Diagnosis not present

## 2016-05-04 DIAGNOSIS — I10 Essential (primary) hypertension: Secondary | ICD-10-CM | POA: Diagnosis not present

## 2016-05-08 ENCOUNTER — Encounter: Payer: Self-pay | Admitting: Neurology

## 2016-05-08 ENCOUNTER — Ambulatory Visit (INDEPENDENT_AMBULATORY_CARE_PROVIDER_SITE_OTHER): Payer: Commercial Managed Care - HMO | Admitting: Neurology

## 2016-05-08 VITALS — BP 141/79 | HR 88 | Ht 67.0 in | Wt 165.8 lb

## 2016-05-08 DIAGNOSIS — N183 Chronic kidney disease, stage 3 unspecified: Secondary | ICD-10-CM

## 2016-05-08 DIAGNOSIS — R413 Other amnesia: Secondary | ICD-10-CM | POA: Diagnosis not present

## 2016-05-08 DIAGNOSIS — I2510 Atherosclerotic heart disease of native coronary artery without angina pectoris: Secondary | ICD-10-CM | POA: Diagnosis not present

## 2016-05-08 DIAGNOSIS — I2583 Coronary atherosclerosis due to lipid rich plaque: Secondary | ICD-10-CM | POA: Diagnosis not present

## 2016-05-08 DIAGNOSIS — R6889 Other general symptoms and signs: Secondary | ICD-10-CM | POA: Diagnosis not present

## 2016-05-08 MED ORDER — MEMANTINE HCL 10 MG PO TABS: 10.0000 mg | ORAL_TABLET | Freq: Two times a day (BID) | ORAL | 4 refills | Status: DC

## 2016-05-08 MED ORDER — DONEPEZIL HCL 10 MG PO TABS: 10.0000 mg | ORAL_TABLET | Freq: Every day | ORAL | 4 refills | Status: DC

## 2016-05-08 NOTE — Progress Notes (Signed)
[facility]  PATIENT: [name] [name] DOB: [birth year]  HISTORY OF PRESENT ILLNESS:[name] [name] is a 82 years old right-handed male follow-up for memory loss, he was a patient of Dr. Jules Husbands since February 2009, for evaluation of memory trouble, his primary care physician is Dr. Ivery Quale.  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 Some anxiety, improved on Remeron and prn Xanax.  He is occasionally irritable and easily provoked to anger. Wife says that he has decreased appetite recently. UPDATE June 2nd 2015: He now came in complaining of bilateral feet and hands paresthesia, which is new since Feb 2015, his symptoms are getting worse, he could hardly picking up things, he also complains of mild gait difficulty, low back pain, no bowel and bladder incontinence,  Recent laboratory evaluation in May 21st 2013, showed mild anemia, hemoglobin 11.8, glucose 96, B12 1004, normal TSH, protein electrophoresis showed elevated M spike a 1.3, mild increased of creatinine 1.57, with GFR of 33, normal liver function tests previously. He is going to be evaluated by hematologist Dr. Judeen Hammans soon. He also complains of low back pain, no significant neck pain,  UPDATE July 6th 2015: He was evaluated by Dr. Benay Spice for chronic  monoclonal IgG lambda protein. There is no clinical evidence of progression to multiple myeloma. The M spike has not changed significantly over many years.  He still has numbness tinglings in his fingers, even to his arms and chest, frightening to him. MRI lumbar in May 2015: L5-S1: pseudo-disc bulging and facet hypertrophy with mild-moderate right and moderate left foraminal stenosis. L3-4: disc bulging and facet hypertrophy with mild biforaminal stenosis. L4-5: pseudo-disc bulging and facet hypertrophy with mild biforaminal stenosis. notable for left renal cyst (5.7cm).  MRI cervical spine in May 2015: C5-6: disc bulging and uncovertebral joint hypertrophy with severe right and moderate left foraminal stenosis. Disc bulging and spondylosis from C4-5 to T2-3. He has history of chronic renal disease, is going to be see by his nephrologist Dr. Posey Pronto in August 2015, I have suggested continued followup with his incidental finding of a left renal cyst with his nephrologist Dr. Posey Pronto  UPDATE April 7th 2016: He continue has slow worsening memory trouble, taking Namenda xr 28 mg, Aricept 10 mg daily, COPD, recovering from pneumonia, Complains of excessive sleepiness,  He is not eating well, he has bad taste in his mouth, Ambulate without difficulty, he has stopped nortriptyline, taking gabapentin for lower extremity pain,  UPDATE 05/02/15 Mr. Rezendes, 82 year old male returns for follow-up. He has a history of Alzheimer's dementia and paresthesias of the feet.is currently on Aricept 10 mg daily and Namenda 28 mg extended release through patient assistance. He has not reapplied for his assistance and took his last pill today according to the wife. He is also taking gabapentin 300 mg 3 times daily for his paresthesias  however he is drowsy from the medication and was encouraged to decrease to 2 times a day. No recent falls and he does not use an assistive device. No longer drives.  UPDATE Oct 17th  2017: He is with his wife at today's clinical visit, he took a bus here, complains of stomach pain, not feeling well, he was recently evaluated by his primary care physician Dr. Moreen Fowler, he continue has slow worsening memory loss, without behavior issues,  REVIEW OF SYSTEMS: Full 14 system review of systems performed and notable only for those listed, all others are neg:  Runny nose, cough, wheezing, shortness of breath, constipation, nausea, sleep talking, incontinence of bladder, frequent urination, back pain, memory loss, headaches, weakness, tremor, confusion   ALLERGIES: No Known Allergies  HOME MEDICATIONS: Outpatient Medications Prior to Visit  Medication Sig Dispense Refill  . acetaminophen (TYLENOL) 500 MG tablet Take 1,000 mg by mouth every 6 (six) hours as needed for headache.    Marland Kitchen aspirin 81 MG tablet Take 1 tablet (81 mg total) by mouth daily.    Marland Kitchen atorvastatin (LIPITOR) 80 MG tablet Take 1 tablet (80 mg total) by mouth daily. 90 tablet 3  . calcitRIOL (ROCALTROL) 0.5 MCG capsule Take 0.5 mcg by mouth daily.    . cetirizine (ZYRTEC) 10 MG tablet Take 10 mg by mouth daily as needed for allergies.    . Cholecalciferol (VITAMIN D3) 5000 UNITS TABS Take 5,000 Units by mouth every other day. Reported on 09/30/2015    . donepezil (ARICEPT) 10 MG tablet Take 1 tablet (10 mg total) by mouth at bedtime. 90 tablet 4  . feeding supplement, ENSURE ENLIVE, (ENSURE ENLIVE) LIQD Take 237 mLs by mouth 2 (two) times daily between meals. 30 Bottle 6  . fenofibrate 160 MG tablet Take 1 tablet (160 mg total) by mouth daily. 30 tablet 11  . ferrous sulfate 325 (65 FE) MG tablet Take 325 mg by mouth every morning.     . finasteride (PROSCAR) 5 MG tablet Take 5 mg by mouth every morning.     . fluticasone (FLONASE) 50 MCG/ACT nasal spray Place 2 sprays into both nostrils daily as needed for allergies.     Marland Kitchen gabapentin (NEURONTIN) 300 MG capsule Take 1 capsule (300 mg total) by mouth 3 (three) times  daily. (Patient taking differently: Take 300 mg by mouth 2 (two) times daily. ) 270 capsule 3  . guaiFENesin (MUCUS RELIEF CHEST CONGESTION) 100 MG/5ML liquid Take 200 mg by mouth 3 (three) times daily as needed for cough. Reported on 01/25/2016    . guaiFENesin-dextromethorphan (ROBITUSSIN DM) 100-10 MG/5ML syrup Take 5 mLs by mouth every 4 (four) hours as needed for cough. 118 mL 0  . ipratropium (ATROVENT) 0.06 % nasal spray Place 1 spray into both nostrils 4 (four) times daily.    Marland Kitchen ipratropium-albuterol (DUONEB) 0.5-2.5 (3) MG/3ML SOLN Take 3 mLs by nebulization every 4 (four) hours as needed. 360 mL 11  . lansoprazole (PREVACID) 30 MG capsule Take 30 mg by mouth daily.     Marland Kitchen latanoprost (XALATAN) 0.005 % ophthalmic solution Place 1 drop into both eyes at bedtime.     Marland Kitchen lubiprostone (AMITIZA) 24 MCG capsule Take 24 mcg by mouth every evening.     . memantine (NAMENDA) 10 MG tablet Take 1 tablet (10 mg total) by mouth 2 (two) times daily. 180 tablet 3  . metoCLOPramide (REGLAN) 10 MG tablet Take 10 mg by mouth 2 (two) times daily. Reported on 09/30/2015    .  metoprolol succinate (TOPROL-XL) 25 MG 24 hr tablet Take 1 tablet (25 mg total) by mouth daily. 90 tablet 0  . mirtazapine (REMERON) 30 MG tablet Take 30 mg by mouth at bedtime.    . Multiple Vitamin (MULTIVITAMIN WITH MINERALS) TABS tablet Take 1 tablet by mouth daily.    . nitroGLYCERIN (NITROSTAT) 0.4 MG SL tablet Place 1 tablet (0.4 mg total) under the tongue every 5 (five) minutes as needed for chest pain. 25 tablet 11  . nystatin (MYCOSTATIN) 100000 UNIT/ML suspension Use as directed 5 mLs in the mouth or throat 4 (four) times daily. Reported on 01/06/2016    . ondansetron (ZOFRAN) 4 MG tablet Take 4 mg by mouth every 8 (eight) hours as needed for nausea or vomiting. Reported on 12/02/2015    . OXYGEN Inhale 2 L into the lungs at bedtime. 2lpm with sleep    . polyethylene glycol (MIRALAX / GLYCOLAX) packet Take 17 g by mouth daily as needed  for moderate constipation.     . Tamsulosin HCl (FLOMAX) 0.4 MG CAPS Take 0.8 mg by mouth at bedtime.     Marland Kitchen tiZANidine (ZANAFLEX) 2 MG tablet Take 2 mg by mouth daily as needed. For muscle spasms    . vitamin B-12 (CYANOCOBALAMIN) 1000 MCG tablet Take 1,000 mcg by mouth daily.     No facility-administered medications prior to visit.     PAST MEDICAL HISTORY: Past Medical History:  Diagnosis Date  . Alzheimer disease   . Anemia   . BPH (benign prostatic hyperplasia)   . CAD (coronary artery disease)    s/p CABG in 1997 with known atretic LIMA to LAD and 40% LM.    Marland Kitchen Carotid artery stenosis    s/p left CEA.  Carotid dopplers 09/2013 60-79% right ICA stenosis and 1-39% left ICA stenosis of left CEA  . Chronic chest wall pain   . Chronic kidney disease (CKD), stage III (moderate)   . COPD (chronic obstructive pulmonary disease) ([place])   . Cough    cough with productive white to yellow sputum.  . DEMENTIA    "short term memory issues"  . Depression with anxiety   . GERD (gastroesophageal reflux disease)   . Glaucoma    bilateral  . Hyperlipemia   . Hypertension   . Memory loss   . Monoclonal gammopathy   . Neuromuscular disorder (HCC)    fingertips numbness"  . On home oxygen therapy    at bedtime-2 L/m nasally..    . Peptic ulcer disease   . PVD (peripheral vascular disease) ([place])    s/p left renal artery stent and left iliac stent  . Retinal artery occlusion     PAST SURGICAL HISTORY: Past Surgical History:  Procedure Laterality Date  . BACK SURGERY  2011   lower back -disc  . CARDIAC SURGERY    . CATARACT EXTRACTION W/PHACO  09/18/2011   Procedure: CATARACT EXTRACTION PHACO AND INTRAOCULAR LENS PLACEMENT (IOC);  Surgeon: Elta Guadeloupe T. Gershon Crane, MD;  Location: [facility];  Service: Ophthalmology;  Laterality: Right;  CDE: 6.19  . COLONOSCOPY WITH PROPOFOL N/A 07/11/2015   Procedure: COLONOSCOPY WITH PROPOFOL;  Surgeon: Laurence Spates, MD;  Location: [facility];  Service:  Endoscopy;  Laterality: N/A;  . CORONARY ARTERY BYPASS GRAFT  1997   "nerve pain at area"  . ESOPHAGOGASTRODUODENOSCOPY (EGD) WITH PROPOFOL N/A 07/11/2015   Procedure: ESOPHAGOGASTRODUODENOSCOPY (EGD) WITH PROPOFOL;  Surgeon: Laurence Spates, MD;  Location: [facility];  Service: Endoscopy;  Laterality: N/A;  .  Left CEA  2002   carotid stent-left-   . RENAL ARTERY STENT      FAMILY HISTORY: Family History  Problem Relation Age of Onset  . Hypertension Father   . Hypertension Mother   . High blood pressure    . High Cholesterol    . Anesthesia problems Neg Hx   . Hypotension Neg Hx   . Malignant hyperthermia Neg Hx   . Pseudochol deficiency Neg Hx     SOCIAL HISTORY: Social History   Social History  . Marital status: Married    Spouse name: Peter Congo  . Number of children: 8  . Years of education: 39   Occupational History  .  Retired    Retired   Social History Main Topics  . Smoking status: Former Smoker    Packs/day: 0.50    Years: 40.00    Types: Cigarettes    Quit date: 07/23/1978  . Smokeless tobacco: Never Used  . Alcohol use No  . Drug use: No  . Sexual activity: Not on file   Other Topics Concern  . Not on file   Social History Narrative   Patient lives at home with his wife. Peter Congo). Patient is retired. Patient has one Year of college.   Right handed.   Caffeine- sometimes     PHYSICAL EXAM  Vitals:   05/08/16 1135  BP: (!) 141/79  Pulse: 88  Weight: 165 lb 12 oz (75.2 kg)  Height: '5\' 7"'$  (1.702 m)   Body mass index is 25.96 kg/m.  PHYSICAL EXAMNIATION:  Gen: NAD, conversant, well nourised, obese, well groomed                     Cardiovascular: Regular rate rhythm, no peripheral edema, warm, nontender. Eyes: Conjunctivae clear without exudates or hemorrhage Neck: Supple, no carotid bruise. Pulmonary: Clear to auscultation bilaterally   NEUROLOGICAL EXAM:  MENTAL STATUS: Speech:    Speech is normal; fluent and spontaneous with normal  comprehension.  Cognition: Mini-Mental Status Examination 11/30, animal naming 5,     Orientation: He is not oriented to date, year, month, season, clinic, city, county, state     Recent and remote memory: He missed 3 out of 3 recalls     Attention span and concentration: He has difficulties world backwards     Normal Language, naming, repeating,spontaneous speech: He has difficulty or write a sentence, difficulty following three-step command     Fund of knowledge   CRANIAL NERVES: CN II: Visual fields are full to confrontation. Fundoscopic exam is normal with sharp discs and no vascular changes. Pupils are round equal and briskly reactive to light. CN III, IV, VI: extraocular movement are normal. No ptosis. CN V: Facial sensation is intact to pinprick in all 3 divisions bilaterally. Corneal responses are intact.  CN VII: Face is symmetric with normal eye closure and smile. CN VIII: Hearing is normal to rubbing fingers CN IX, X: Palate elevates symmetrically. Phonation is normal. CN XI: Head turning and shoulder shrug are intact CN XII: Tongue is midline with normal movements and no atrophy.  MOTOR: There is no pronator drift of out-stretched arms. Muscle bulk and tone are normal. Muscle strength is normal.  REFLEXES: Reflexes are 2+ and symmetric at the biceps, triceps, knees, and ankles. Plantar responses are flexor.  SENSORY: Length dependent decreased light touch, pinprick to distal leg,  COORDINATION: Rapid alternating movements and fine finger movements are intact. There is no dysmetria on finger-to-nose and  heel-knee-shin.    GAIT/STANCE: Need to push on chair arm to get up from seated position, flexed forward, mildly unsteady  DIAGNOSTIC DATA (LABS, IMAGING, TESTING) - Lab Results  Component Value Date   CHOL 176 03/23/2016   HDL 36 (L) 03/23/2016   LDLCALC 101 03/23/2016   LDLDIRECT 110.0 12/09/2014   TRIG 197 (H) 03/23/2016   CHOLHDL 4.9 03/23/2016    ASSESSMENT  AND PLAN 82 years old male,  Moderate dementia without behavioral issue  Mini-Mental Status Examination 11/30, animal naming 5   I refilled his Aricept, Namenda Bilateral lower extremity neuropathic pain Polypharmacy treatment  I went over the medication list with patient and his wife   Gabapentin 300 mg tablets, I have advised his wife to decrease the dosage, if possible only nighttime as needed to decrease the side effect  He will continue to refill with his primary care physician Dr. Moreen Fowler,  will only return to clinic for new issues  Marcial Pacas, M.D. Ph.D.  [facility] [place], [place] [address] Phone: [phone] Fax:      [phone] CC: Antony Contras, MD

## 2016-05-11 ENCOUNTER — Telehealth: Payer: Self-pay | Admitting: Cardiology

## 2016-05-11 ENCOUNTER — Other Ambulatory Visit: Payer: Self-pay | Admitting: Cardiology

## 2016-05-11 ENCOUNTER — Encounter: Payer: Self-pay | Admitting: Cardiology

## 2016-05-11 ENCOUNTER — Other Ambulatory Visit: Payer: Commercial Managed Care - HMO

## 2016-05-11 DIAGNOSIS — E785 Hyperlipidemia, unspecified: Secondary | ICD-10-CM | POA: Diagnosis not present

## 2016-05-11 NOTE — Telephone Encounter (Signed)
New message      Pt is due to have labs drawn today.  They cannot get here.  Calling to ask the nurse to send the order to [place] and they can go today and have the labs drawn.  Please call her and let her know if this is ok

## 2016-05-11 NOTE — Telephone Encounter (Signed)
Spoke with pt's wife and advised I am sending orders over to the [facility] lab in [place].  Wife very appreciative for assistance. Cancelled lab appt at [facility].

## 2016-05-12 LAB — ALT: ALT: 19 U/L (ref 9–46)

## 2016-05-12 LAB — LIPID PANEL
CHOL/HDL RATIO: 4.5 ratio (ref <=5.0)
Cholesterol: 138 mg/dL (ref 125–200)
HDL: 31 mg/dL — AB (ref >=40)
LDL CALC: 78 mg/dL (ref <130)
Triglycerides: 146 mg/dL (ref <150)
VLDL: 29 mg/dL (ref <30)

## 2016-05-15 DIAGNOSIS — J449 Chronic obstructive pulmonary disease, unspecified: Secondary | ICD-10-CM | POA: Diagnosis not present

## 2016-05-15 DIAGNOSIS — J9611 Chronic respiratory failure with hypoxia: Secondary | ICD-10-CM | POA: Diagnosis not present

## 2016-05-15 DIAGNOSIS — J441 Chronic obstructive pulmonary disease with (acute) exacerbation: Secondary | ICD-10-CM | POA: Diagnosis not present

## 2016-05-15 DIAGNOSIS — N183 Chronic kidney disease, stage 3 (moderate): Secondary | ICD-10-CM | POA: Diagnosis not present

## 2016-05-22 ENCOUNTER — Telehealth: Payer: Self-pay | Admitting: Cardiology

## 2016-05-22 NOTE — Telephone Encounter (Signed)
Lab results are not in chart.  I spoke with pt's wife who reports lab work was done on 10/20 at [facility] lab on [address] in [place].  I told her I would contact [facility] and ask them to send [place] results.   I spoke with [facility] in [place] [phone]) and lab work was done.  I was instructed to contact customer service [phone]) to see why results did not cross over to chart.    I spoke with customer service and they will fax results to our office. They will also resend via EMR.  If not received in EMR we can contact IT at [phone]

## 2016-05-22 NOTE — Telephone Encounter (Signed)
Pt's wife calling for lab results-pls call

## 2016-05-23 ENCOUNTER — Telehealth: Payer: Self-pay

## 2016-05-23 MED ORDER — ROSUVASTATIN CALCIUM 40 MG PO TABS: 40.0000 mg | ORAL_TABLET | Freq: Every day | ORAL | 11 refills | Status: DC

## 2016-05-23 NOTE — Telephone Encounter (Signed)
-----   Message from Leeroy Bock, [facility] sent at 05/23/2016 12:46 PM EDT ----- See if patient is willing to switch to Crestor 40mg  daily. It tends to be tolerated better than Lipitor and will also lower LDL more. Since pt's LDL is already in the 70s, this switch should bring his LDL to goal <70.

## 2016-05-23 NOTE — Telephone Encounter (Signed)
See lab result notes

## 2016-05-23 NOTE — Telephone Encounter (Signed)
Instructed DPR to have patient STOP LIPITOR and START CRESTOR 40 mg daily. Rx for FLP and ALT mailed per her request to have drawn at home. She agrees with treatment plan.

## 2016-05-24 ENCOUNTER — Other Ambulatory Visit: Payer: Self-pay

## 2016-05-24 NOTE — Patient Outreach (Signed)
[facility]) Care Management  [place]  05/24/2016   [name] [name] December 29, 1930 [id number]  Subjective: Telephone call to caregiver for monthly call. Wife reports that patient is having some problems with nausea and decreased appetite over the last few days.   However, she reports that patient is some better today and did eat some today. She also reports that patient has been drinking ensure despite feeling nauseous.  She reports he has also had some problems with reflux.  Advised that the reflux could be the reason he has some nausea as well.  She states she is going to call the primary doctor today to see if he can be seen by the nurse practitioner as a precaution. She states his breathing has been fine but continues to have a runny nose and sounds of fluid in his throat.  Advised that patient could possibly not be swallowing well as he had trouble with that is the past.  Discussed with wife to remind patient to swallow multiple times before drinking another sip.  She verbalized understanding.  Discussed with wife COPD signs and symptoms and when to notify physician.  She verbalized understanding.    Objective:   Encounter Medications:  Outpatient Encounter Prescriptions as of 05/24/2016  Medication Sig Note  . acetaminophen (TYLENOL) 500 MG tablet Take 1,000 mg by mouth every 6 (six) hours as needed for headache.   Marland Kitchen aspirin 81 MG tablet Take 1 tablet (81 mg total) by mouth daily.   . calcitRIOL (ROCALTROL) 0.5 MCG capsule Take 0.5 mcg by mouth daily.   . cetirizine (ZYRTEC) 10 MG tablet Take 10 mg by mouth daily as needed for allergies.   . Cholecalciferol (VITAMIN D3) 5000 UNITS TABS Take 5,000 Units by mouth every other day. Reported on 09/30/2015   . donepezil (ARICEPT) 10 MG tablet Take 1 tablet (10 mg total) by mouth at bedtime.   . feeding supplement, ENSURE ENLIVE, (ENSURE ENLIVE) LIQD Take 237 mLs by mouth 2 (two) times daily between meals.   . fenofibrate 160  MG tablet Take 1 tablet (160 mg total) by mouth daily.   . ferrous sulfate 325 (65 FE) MG tablet Take 325 mg by mouth every morning.    . finasteride (PROSCAR) 5 MG tablet Take 5 mg by mouth every morning.    . fluticasone (FLONASE) 50 MCG/ACT nasal spray Place 2 sprays into both nostrils daily as needed for allergies.    Marland Kitchen gabapentin (NEURONTIN) 300 MG capsule Take 1 capsule (300 mg total) by mouth 3 (three) times daily. (Patient taking differently: Take 300 mg by mouth 2 (two) times daily. )   . guaiFENesin (MUCUS RELIEF CHEST CONGESTION) 100 MG/5ML liquid Take 200 mg by mouth 3 (three) times daily as needed for cough. Reported on 01/25/2016   . guaiFENesin-dextromethorphan (ROBITUSSIN DM) 100-10 MG/5ML syrup Take 5 mLs by mouth every 4 (four) hours as needed for cough.   Marland Kitchen ipratropium (ATROVENT) 0.06 % nasal spray Place 1 spray into both nostrils 4 (four) times daily.   Marland Kitchen ipratropium-albuterol (DUONEB) 0.5-2.5 (3) MG/3ML SOLN Take 3 mLs by nebulization every 4 (four) hours as needed.   . lansoprazole (PREVACID) 30 MG capsule Take 30 mg by mouth daily.    Marland Kitchen latanoprost (XALATAN) 0.005 % ophthalmic solution Place 1 drop into both eyes at bedtime.    Marland Kitchen lubiprostone (AMITIZA) 24 MCG capsule Take 24 mcg by mouth every evening.    . memantine (NAMENDA) 10 MG tablet Take 1  tablet (10 mg total) by mouth 2 (two) times daily.   . metoCLOPramide (REGLAN) 10 MG tablet Take 10 mg by mouth 2 (two) times daily. Reported on 09/30/2015   . metoprolol succinate (TOPROL-XL) 25 MG 24 hr tablet Take 1 tablet (25 mg total) by mouth daily.   . mirtazapine (REMERON) 30 MG tablet Take 30 mg by mouth at bedtime.   . Multiple Vitamin (MULTIVITAMIN WITH MINERALS) TABS tablet Take 1 tablet by mouth daily.   . nitroGLYCERIN (NITROSTAT) 0.4 MG SL tablet Place 1 tablet (0.4 mg total) under the tongue every 5 (five) minutes as needed for chest pain.   Marland Kitchen nystatin (MYCOSTATIN) 100000 UNIT/ML suspension Use as directed 5 mLs in the  mouth or throat 4 (four) times daily. Reported on 01/06/2016 12/13/2015: Received from: External Pharmacy  . ondansetron (ZOFRAN) 4 MG tablet Take 4 mg by mouth every 8 (eight) hours as needed for nausea or vomiting. Reported on 12/02/2015   . OXYGEN Inhale 2 L into the lungs at bedtime. 2lpm with sleep   . polyethylene glycol (MIRALAX / GLYCOLAX) packet Take 17 g by mouth daily as needed for moderate constipation.    . rosuvastatin (CRESTOR) 40 MG tablet Take 1 tablet (40 mg total) by mouth daily.   . Tamsulosin HCl (FLOMAX) 0.4 MG CAPS Take 0.8 mg by mouth at bedtime.    Marland Kitchen tiZANidine (ZANAFLEX) 2 MG tablet Take 2 mg by mouth daily as needed. For muscle spasms 12/13/2015: Received from: External Pharmacy Received Sig:   . vitamin B-12 (CYANOCOBALAMIN) 1000 MCG tablet Take 1,000 mcg by mouth daily.    No facility-administered encounter medications on file as of 05/24/2016.     Functional Status:  In your present state of health, do you have any difficulty performing the following activities: 02/28/2016 01/26/2016  Hearing? N -  Vision? Y -  Difficulty concentrating or making decisions? Y -  Walking or climbing stairs? Y -  Dressing or bathing? Y -  Doing errands, shopping? Y -  Conservation officer, nature and eating ? Y -  Using the Toilet? N -  In the past six months, have you accidently leaked urine? Y -  Do you have problems with loss of bowel control? Y (No Data)  Managing your Medications? Y -  Managing your Finances? Y -  Housekeeping or managing your Housekeeping? Y -  Some recent data might be hidden    Fall/Depression Screening: PHQ 2/9 Scores 05/24/2016 04/23/2016 03/29/2016 02/28/2016 01/13/2016 12/16/2015 09/30/2015  PHQ - 2 Score 0 0 0 0 - - 0  Exception Documentation - - - - Medical reason Medical reason -    Assessment: Patient continues to benefit from health coach outreach for disease management and support.    Plan:  [facility] CM Care Plan Problem One   Flowsheet Row Most Recent Value  Care  Plan Problem One  COPD knowledge deficit  Role Documenting the Problem One  Health Coach  Care Plan for Problem One  Active  [facility] Long Term Goal (31-90 days)  Caregiver will be able to verbalize action plan for COPD exacerbation in the next 90 days  [facility] Long Term Goal Start Date  05/24/16 [goal continued]  Interventions for Problem One Long Term Goal  RN Health Coach reinforced with caregiver signs and symptoms of COPD and when to notify physician.        RN Health Coach will contact patient in the month of December and patient agrees to next outreach.  [name] [name]  Durwin Reges, RN, MSN [place] [phone]

## 2016-05-28 DIAGNOSIS — J309 Allergic rhinitis, unspecified: Secondary | ICD-10-CM | POA: Diagnosis not present

## 2016-05-28 DIAGNOSIS — K219 Gastro-esophageal reflux disease without esophagitis: Secondary | ICD-10-CM | POA: Diagnosis not present

## 2016-05-28 DIAGNOSIS — K5900 Constipation, unspecified: Secondary | ICD-10-CM | POA: Diagnosis not present

## 2016-05-28 DIAGNOSIS — F0390 Unspecified dementia without behavioral disturbance: Secondary | ICD-10-CM | POA: Diagnosis not present

## 2016-06-04 DIAGNOSIS — R0600 Dyspnea, unspecified: Secondary | ICD-10-CM | POA: Diagnosis not present

## 2016-06-04 DIAGNOSIS — J441 Chronic obstructive pulmonary disease with (acute) exacerbation: Secondary | ICD-10-CM | POA: Diagnosis not present

## 2016-06-04 DIAGNOSIS — I739 Peripheral vascular disease, unspecified: Secondary | ICD-10-CM | POA: Diagnosis not present

## 2016-06-04 DIAGNOSIS — I10 Essential (primary) hypertension: Secondary | ICD-10-CM | POA: Diagnosis not present

## 2016-06-06 ENCOUNTER — Encounter (HOSPITAL_COMMUNITY): Payer: Commercial Managed Care - HMO

## 2016-06-07 ENCOUNTER — Other Ambulatory Visit (HOSPITAL_COMMUNITY): Payer: Self-pay | Admitting: Gastroenterology

## 2016-06-07 DIAGNOSIS — R1310 Dysphagia, unspecified: Secondary | ICD-10-CM

## 2016-06-07 DIAGNOSIS — F0390 Unspecified dementia without behavioral disturbance: Secondary | ICD-10-CM | POA: Diagnosis not present

## 2016-06-07 DIAGNOSIS — K219 Gastro-esophageal reflux disease without esophagitis: Secondary | ICD-10-CM | POA: Diagnosis not present

## 2016-06-07 DIAGNOSIS — K3184 Gastroparesis: Secondary | ICD-10-CM | POA: Diagnosis not present

## 2016-06-08 ENCOUNTER — Emergency Department (HOSPITAL_COMMUNITY): Payer: Commercial Managed Care - HMO

## 2016-06-08 ENCOUNTER — Telehealth (HOSPITAL_COMMUNITY): Payer: Self-pay | Admitting: Speech Pathology

## 2016-06-08 ENCOUNTER — Encounter (HOSPITAL_COMMUNITY): Payer: Self-pay | Admitting: Emergency Medicine

## 2016-06-08 ENCOUNTER — Emergency Department (HOSPITAL_COMMUNITY)
Admission: EM | Admit: 2016-06-08 | Discharge: 2016-06-08 | Disposition: A | Discharge status: Home / Self Care | Payer: Commercial Managed Care - HMO | Attending: Emergency Medicine | Admitting: Emergency Medicine

## 2016-06-08 DIAGNOSIS — I129 Hypertensive chronic kidney disease with stage 1 through stage 4 chronic kidney disease, or unspecified chronic kidney disease: Secondary | ICD-10-CM | POA: Insufficient documentation

## 2016-06-08 DIAGNOSIS — I2510 Atherosclerotic heart disease of native coronary artery without angina pectoris: Secondary | ICD-10-CM | POA: Insufficient documentation

## 2016-06-08 DIAGNOSIS — N183 Chronic kidney disease, stage 3 (moderate): Secondary | ICD-10-CM | POA: Insufficient documentation

## 2016-06-08 DIAGNOSIS — Z87891 Personal history of nicotine dependence: Secondary | ICD-10-CM | POA: Insufficient documentation

## 2016-06-08 DIAGNOSIS — Z7982 Long term (current) use of aspirin: Secondary | ICD-10-CM | POA: Insufficient documentation

## 2016-06-08 DIAGNOSIS — Z79899 Other long term (current) drug therapy: Secondary | ICD-10-CM | POA: Insufficient documentation

## 2016-06-08 DIAGNOSIS — N390 Urinary tract infection, site not specified: Secondary | ICD-10-CM | POA: Insufficient documentation

## 2016-06-08 DIAGNOSIS — K8020 Calculus of gallbladder without cholecystitis without obstruction: Secondary | ICD-10-CM | POA: Diagnosis not present

## 2016-06-08 DIAGNOSIS — R1084 Generalized abdominal pain: Secondary | ICD-10-CM | POA: Diagnosis present

## 2016-06-08 DIAGNOSIS — G309 Alzheimer's disease, unspecified: Secondary | ICD-10-CM | POA: Diagnosis not present

## 2016-06-08 DIAGNOSIS — R109 Unspecified abdominal pain: Secondary | ICD-10-CM | POA: Diagnosis not present

## 2016-06-08 DIAGNOSIS — R110 Nausea: Secondary | ICD-10-CM | POA: Diagnosis not present

## 2016-06-08 LAB — CBC WITH DIFFERENTIAL/PLATELET
BASOS ABS: 0 10*3/uL (ref 0.0–0.1)
BASOS PCT: 0 %
EOS PCT: 3 %
Eosinophils Absolute: 0.2 10*3/uL (ref 0.0–0.7)
HEMATOCRIT: 34.5 % — AB (ref 39.0–52.0)
Hemoglobin: 11.7 g/dL — ABNORMAL LOW (ref 13.0–17.0)
LYMPHS PCT: 41 %
Lymphs Abs: 2.6 10*3/uL (ref 0.7–4.0)
MCH: 33.8 pg (ref 26.0–34.0)
MCHC: 33.9 g/dL (ref 30.0–36.0)
MCV: 99.7 fL (ref 78.0–100.0)
Monocytes Absolute: 0.6 10*3/uL (ref 0.1–1.0)
Monocytes Relative: 10 %
NEUTROS ABS: 2.9 10*3/uL (ref 1.7–7.7)
Neutrophils Relative %: 46 %
PLATELETS: 228 10*3/uL (ref 150–400)
RBC: 3.46 MIL/uL — AB (ref 4.22–5.81)
RDW: 15.2 % (ref 11.5–15.5)
WBC: 6.3 10*3/uL (ref 4.0–10.5)

## 2016-06-08 LAB — URINALYSIS, ROUTINE W REFLEX MICROSCOPIC
Bilirubin Urine: NEGATIVE
GLUCOSE, UA: NEGATIVE mg/dL
Hgb urine dipstick: NEGATIVE
KETONES UR: NEGATIVE mg/dL
NITRITE: NEGATIVE
PROTEIN: NEGATIVE mg/dL
Specific Gravity, Urine: 1.02 (ref 1.005–1.030)
pH: 6 (ref 5.0–8.0)

## 2016-06-08 LAB — COMPREHENSIVE METABOLIC PANEL
ALBUMIN: 3.9 g/dL (ref 3.5–5.0)
ALT: 17 U/L (ref 17–63)
ANION GAP: 7 (ref 5–15)
AST: 32 U/L (ref 15–41)
Alkaline Phosphatase: 29 U/L — ABNORMAL LOW (ref 38–126)
BUN: 23 mg/dL — AB (ref 6–20)
CHLORIDE: 106 mmol/L (ref 101–111)
CO2: 27 mmol/L (ref 22–32)
Calcium: 10 mg/dL (ref 8.9–10.3)
Creatinine, Ser: 2.48 mg/dL — ABNORMAL HIGH (ref 0.61–1.24)
GFR calc Af Amer: 26 mL/min — ABNORMAL LOW (ref >60)
GFR calc non Af Amer: 22 mL/min — ABNORMAL LOW (ref >60)
GLUCOSE: 85 mg/dL (ref 65–99)
POTASSIUM: 4.3 mmol/L (ref 3.5–5.1)
SODIUM: 140 mmol/L (ref 135–145)
Total Bilirubin: 0.5 mg/dL (ref 0.3–1.2)
Total Protein: 7.9 g/dL (ref 6.5–8.1)

## 2016-06-08 LAB — LACTIC ACID, PLASMA
LACTIC ACID, VENOUS: 1.5 mmol/L (ref 0.5–1.9)
LACTIC ACID, VENOUS: 1.5 mmol/L (ref 0.5–1.9)

## 2016-06-08 LAB — URINE MICROSCOPIC-ADD ON: RBC / HPF: NONE SEEN RBC/hpf (ref 0–5)

## 2016-06-08 LAB — LIPASE, BLOOD: Lipase: 20 U/L (ref 11–51)

## 2016-06-08 LAB — TROPONIN I: Troponin I: <0.03 ng/mL (ref <0.03)

## 2016-06-08 MED ORDER — DEXTROSE 5 % IV SOLN
1.0000 g | Freq: Once | INTRAVENOUS | Status: AC
  Administered 2016-06-08: 1 g via INTRAVENOUS
  Filled 2016-06-08: qty 10

## 2016-06-08 MED ORDER — ONDANSETRON HCL 4 MG PO TABS: 4.0000 mg | ORAL_TABLET | Freq: Three times a day (TID) | ORAL | 0 refills | Status: DC | PRN

## 2016-06-08 MED ORDER — CEPHALEXIN 500 MG PO CAPS: 500.0000 mg | ORAL_CAPSULE | Freq: Four times a day (QID) | ORAL | 0 refills | Status: DC

## 2016-06-08 MED ORDER — ONDANSETRON HCL 4 MG/2ML IJ SOLN
4.0000 mg | INTRAMUSCULAR | Status: DC | PRN
  Administered 2016-06-08: 4 mg via INTRAVENOUS
  Filled 2016-06-08: qty 2

## 2016-06-08 MED ORDER — IOPAMIDOL (ISOVUE-300) INJECTION 61%
INTRAVENOUS | Status: AC
  Filled 2016-06-08: qty 30

## 2016-06-08 NOTE — Telephone Encounter (Signed)
[name] [name] states the Flouro test is all the MD is ordering for now. The pt will have this test on 06/12/16 @9 :30 at [facility]. Nf 06/08/16 Contacted MD office for clarification on written order, waiting on return call from [place] at [facility] office. NF 06/08/16

## 2016-06-08 NOTE — ED Notes (Signed)
Pt drinking contrast. 

## 2016-06-08 NOTE — Discharge Instructions (Signed)
Take the prescription as directed.  Call your regular medical doctor on Monday to schedule a follow up appointment within the next 3 days.  Return to the Emergency Department immediately sooner if worsening.  ° °

## 2016-06-08 NOTE — ED Triage Notes (Signed)
PT c/o middle abdominal discomfort with nausea and decreased appetitie x1 week. PT states normal BM 06/07/16.

## 2016-06-08 NOTE — ED Provider Notes (Signed)
[facility] DEPT Provider Note   CSN: [id number] Arrival date & time: 06/08/16  1704     History   Chief Complaint Chief Complaint  Patient presents with  . Abdominal Pain    HPI [name] [name] is a 82 y.o. male.  The history is provided by the patient and the spouse. The history is limited by the condition of the patient (Hx dementia).  Abdominal Pain      Pt was seen at 1730. Per pt and his wife, c/o gradual onset and persistence of constant nausea for the past 2 weeks. Has been associated with decreased PO intake. Pt's wife states pt "gets choked on his food when he swallows." Pt's PMD has scheduled an outpatient swallowing study, per pt's wife. Over the past week, pt has developed generalized abd "pain" as well as generalized weakness. Denies vomiting/diarrhea, no CP/palpitations, no SOB/cough, no back pain, no fevers, no focal motor weakness.    Past Medical History:  Diagnosis Date  . Alzheimer disease   . Anemia   . BPH (benign prostatic hyperplasia)   . CAD (coronary artery disease)    s/p CABG in 1997 with known atretic LIMA to LAD and 40% LM.    Marland Kitchen Carotid artery stenosis    s/p left CEA.  Carotid dopplers 09/2013 60-79% right ICA stenosis and 1-39% left ICA stenosis of left CEA  . Chronic chest wall pain   . Chronic kidney disease (CKD), stage III (moderate)   . COPD (chronic obstructive pulmonary disease) ([place])   . Cough    cough with productive white to yellow sputum.  . DEMENTIA    "short term memory issues"  . Depression with anxiety   . GERD (gastroesophageal reflux disease)   . Glaucoma    bilateral  . Hyperlipemia   . Hypertension   . Memory loss   . Monoclonal gammopathy   . Neuromuscular disorder (HCC)    fingertips numbness"  . On home oxygen therapy    at bedtime-2 L/m nasally..    . Peptic ulcer disease   . PVD (peripheral vascular disease) ([place])    s/p left renal artery stent and left iliac stent  . Retinal artery occlusion      Patient Active Problem List   Diagnosis Date Noted  . Dysphagia 12/13/2015  . Anorexia 12/13/2015  . CAP (community acquired pneumonia) 12/13/2015  . History of thrush 12/13/2015  . Hypertension, essential 12/13/2015  . Dementia 12/13/2015  . Upper airway cough syndrome 09/01/2015  . COPD GOLD II 04/26/2015  . Chronic respiratory failure with hypoxia ([place]) 02/25/2015  . Carotid stenosis, asymptomatic 01/25/2015  . Delirium 10/15/2014  . Chronic chest wall pain   . Paresthesia 12/22/2013  . PVD (peripheral vascular disease) ([place])   . GERD (gastroesophageal reflux disease)   . CAD (coronary artery disease)   . Alzheimer disease   . Memory loss   . PAIN IN JOINT, MULTIPLE SITES 04/19/2009  . Allergic rhinitis 08/16/2008  . Dyslipidemia 03/26/2007  . MONOCLONAL GAMMOPATHY 03/26/2007  . CKD (chronic kidney disease) stage 3, GFR 30-59 ml/min 03/26/2007    Past Surgical History:  Procedure Laterality Date  . BACK SURGERY  2011   lower back -disc  . CARDIAC SURGERY    . CATARACT EXTRACTION W/PHACO  09/18/2011   Procedure: CATARACT EXTRACTION PHACO AND INTRAOCULAR LENS PLACEMENT (IOC);  Surgeon: Elta Guadeloupe T. Gershon Crane, MD;  Location: [facility];  Service: Ophthalmology;  Laterality: Right;  CDE: 6.19  . COLONOSCOPY WITH PROPOFOL  N/A 07/11/2015   Procedure: COLONOSCOPY WITH PROPOFOL;  Surgeon: Laurence Spates, MD;  Location: [facility];  Service: Endoscopy;  Laterality: N/A;  . CORONARY ARTERY BYPASS GRAFT  1997   "nerve pain at area"  . ESOPHAGOGASTRODUODENOSCOPY (EGD) WITH PROPOFOL N/A 07/11/2015   Procedure: ESOPHAGOGASTRODUODENOSCOPY (EGD) WITH PROPOFOL;  Surgeon: Laurence Spates, MD;  Location: [facility];  Service: Endoscopy;  Laterality: N/A;  . Left CEA  2002   carotid stent-left-   . RENAL ARTERY STENT         Home Medications    Prior to Admission medications   Medication Sig Start Date End Date Taking? Authorizing Provider  aspirin 81 MG tablet Take 1 tablet (81 mg  total) by mouth daily. 03/12/16  Yes Sueanne Margarita, MD  ferrous sulfate 325 (65 FE) MG tablet Take 325 mg by mouth every morning.    Yes Historical Provider, MD  gabapentin (NEURONTIN) 300 MG capsule Take 1 capsule (300 mg total) by mouth 3 (three) times daily. Patient taking differently: Take 300 mg by mouth every morning.  05/02/15  Yes Dennie Bible, NP  Multiple Vitamin (MULTIVITAMIN WITH MINERALS) TABS tablet Take 1 tablet by mouth daily.   Yes Historical Provider, MD  acetaminophen (TYLENOL) 500 MG tablet Take 1,000 mg by mouth every 6 (six) hours as needed for headache.    Historical Provider, MD  calcitRIOL (ROCALTROL) 0.5 MCG capsule Take 0.5 mcg by mouth daily. 07/29/14   Historical Provider, MD  cetirizine (ZYRTEC) 10 MG tablet Take 10 mg by mouth daily as needed for allergies.    Historical Provider, MD  Cholecalciferol (VITAMIN D3) 5000 UNITS TABS Take 5,000 Units by mouth every other day. Reported on 09/30/2015    Historical Provider, MD  donepezil (ARICEPT) 10 MG tablet Take 1 tablet (10 mg total) by mouth at bedtime. 05/08/16   Marcial Pacas, MD  feeding supplement, ENSURE ENLIVE, (ENSURE ENLIVE) LIQD Take 237 mLs by mouth 2 (two) times daily between meals. 10/16/14   Nishant Dhungel, MD  fenofibrate 160 MG tablet Take 1 tablet (160 mg total) by mouth daily. 02/02/16   Sueanne Margarita, MD  finasteride (PROSCAR) 5 MG tablet Take 5 mg by mouth every morning.     Historical Provider, MD  fluticasone (FLONASE) 50 MCG/ACT nasal spray Place 2 sprays into both nostrils daily as needed for allergies.     Historical Provider, MD  guaiFENesin (MUCUS RELIEF CHEST CONGESTION) 100 MG/5ML liquid Take 200 mg by mouth 3 (three) times daily as needed for cough. Reported on 01/25/2016    Historical Provider, MD  guaiFENesin-dextromethorphan (ROBITUSSIN DM) 100-10 MG/5ML syrup Take 5 mLs by mouth every 4 (four) hours as needed for cough. 10/16/14   Nishant Dhungel, MD  ipratropium (ATROVENT) 0.06 % nasal  spray Place 1 spray into both nostrils 4 (four) times daily.    Historical Provider, MD  ipratropium-albuterol (DUONEB) 0.5-2.5 (3) MG/3ML SOLN Take 3 mLs by nebulization every 4 (four) hours as needed. 02/02/16   Tanda Rockers, MD  lansoprazole (PREVACID) 30 MG capsule Take 30 mg by mouth daily.     Historical Provider, MD  latanoprost (XALATAN) 0.005 % ophthalmic solution Place 1 drop into both eyes at bedtime.  12/28/13   Historical Provider, MD  lubiprostone (AMITIZA) 24 MCG capsule Take 24 mcg by mouth every evening.     Historical Provider, MD  memantine (NAMENDA) 10 MG tablet Take 1 tablet (10 mg total) by mouth 2 (two) times daily. Patient not  taking: Reported on 06/08/2016 05/08/16   Marcial Pacas, MD  metoCLOPramide (REGLAN) 10 MG tablet Take 10 mg by mouth 2 (two) times daily. Reported on 09/30/2015 11/01/14   Historical Provider, MD  metoprolol succinate (TOPROL-XL) 25 MG 24 hr tablet Take 1 tablet (25 mg total) by mouth daily. 03/01/16   Sueanne Margarita, MD  mirtazapine (REMERON) 30 MG tablet Take 30 mg by mouth at bedtime.    Historical Provider, MD  nitroGLYCERIN (NITROSTAT) 0.4 MG SL tablet Place 1 tablet (0.4 mg total) under the tongue every 5 (five) minutes as needed for chest pain. 12/15/15   Sueanne Margarita, MD  nystatin (MYCOSTATIN) 100000 UNIT/ML suspension Use as directed 5 mLs in the mouth or throat 4 (four) times daily. Reported on 01/06/2016 12/02/15   Historical Provider, MD  ondansetron (ZOFRAN) 4 MG tablet Take 4 mg by mouth every 8 (eight) hours as needed for nausea or vomiting. Reported on 12/02/2015    Historical Provider, MD  OXYGEN Inhale 2 L into the lungs at bedtime. 2lpm with sleep    Historical Provider, MD  polyethylene glycol (MIRALAX / GLYCOLAX) packet Take 17 g by mouth daily as needed for moderate constipation.     Historical Provider, MD  rosuvastatin (CRESTOR) 40 MG tablet Take 1 tablet (40 mg total) by mouth daily. 05/23/16 08/21/16  Sueanne Margarita, MD  Tamsulosin HCl  (FLOMAX) 0.4 MG CAPS Take 0.8 mg by mouth at bedtime.     Historical Provider, MD  tiZANidine (ZANAFLEX) 2 MG tablet Take 2 mg by mouth daily as needed. For muscle spasms 12/02/15   Historical Provider, MD  vitamin B-12 (CYANOCOBALAMIN) 1000 MCG tablet Take 1,000 mcg by mouth daily.    Historical Provider, MD    Family History Family History  Problem Relation Age of Onset  . Hypertension Father   . Hypertension Mother   . High blood pressure    . High Cholesterol    . Anesthesia problems Neg Hx   . Hypotension Neg Hx   . Malignant hyperthermia Neg Hx   . Pseudochol deficiency Neg Hx     Social History Social History  Substance Use Topics  . Smoking status: Former Smoker    Packs/day: 0.50    Years: 40.00    Types: Cigarettes    Quit date: 07/23/1978  . Smokeless tobacco: Never Used  . Alcohol use No     Allergies   Patient has no known allergies.   Review of Systems Review of Systems  Unable to perform ROS: Dementia  Gastrointestinal: Positive for abdominal pain.     Physical Exam Updated Vital Signs BP 123/71 (BP Location: Left Arm)   Pulse 88   Temp 97.8 F (36.6 C) (Oral)   Resp 18   Ht 5\' 7"  (1.702 m)   Wt 165 lb (74.8 kg)   SpO2 96%   BMI 25.84 kg/m   18:17 Orthostatic Vital Signs JM  Orthostatic Lying   BP- Lying: 141/70  Pulse- Lying: 70      Orthostatic Sitting  BP- Sitting: 151/60  Pulse- Sitting: 76      Orthostatic Standing at 0 minutes  BP- Standing at 0 minutes: 128/68  Pulse- Standing at 0 minutes: 80     Physical Exam 1735: Physical examination:  Nursing notes reviewed; Vital signs and O2 SAT reviewed;  Constitutional: Well developed, Well nourished, In no acute distress; Head:  Normocephalic, atraumatic; Eyes: EOMI, PERRL, No scleral icterus; ENMT: Mouth and pharynx normal, Mucous  membranes dry; Neck: Supple, Full range of motion, No lymphadenopathy; Cardiovascular: Regular rate and rhythm, No gallop; Respiratory: Breath sounds  clear & equal bilaterally, No wheezes.  Speaking full sentences with ease, Normal respiratory effort/excursion; Chest: Nontender, Movement normal; Abdomen: Soft, +mild diffuse tenderness to palp. No rebound or guarding. Nondistended, Normal bowel sounds; Genitourinary: No CVA tenderness; Extremities: Pulses normal, No tenderness, No edema, No calf edema or asymmetry.; Neuro: Awake, alert, confused per hx dementia. No facial droop, Major CN grossly intact.  Speech clear. Moves all extremities spontaneously without apparent gross focal motor deficits.; Skin: Color normal, Warm, Dry.   ED Treatments / Results  Labs (all labs ordered are listed, but only abnormal results are displayed)   EKG  EKG Interpretation  Date/Time:  Friday June 08 2016 17:39:23 EST Ventricular Rate:  77 PR Interval:    QRS Duration: 86 QT Interval:  397 QTC Calculation: 450 R Axis:   55 Text Interpretation:  Sinus rhythm Low voltage, precordial leads Baseline wander When compared with ECG of 12/13/2015 No significant change was found Confirmed by [facility]  MD, Nunzio Cory [phone]) on 06/08/2016 5:43:52 PM       Radiology   Procedures Procedures (including critical care time)  Medications Ordered in ED Medications  iopamidol (ISOVUE-300) 61 % injection (not administered)     Initial Impression / Assessment and Plan / ED Course  I have reviewed the triage vital signs and the nursing notes.  Pertinent labs & imaging results that were available during my care of the patient were reviewed by me and considered in my medical decision making (see chart for details).  MDM Reviewed: previous chart, nursing note and vitals Reviewed previous: labs and ECG Interpretation: labs, ECG, x-ray and CT scan   Results for orders placed or performed during the hospital encounter of 06/08/16  Comprehensive metabolic panel  Result Value Ref Range   Sodium 140 135 - 145 mmol/L   Potassium 4.3 3.5 - 5.1 mmol/L   Chloride  106 101 - 111 mmol/L   CO2 27 22 - 32 mmol/L   Glucose, Bld 85 65 - 99 mg/dL   BUN 23 (H) 6 - 20 mg/dL   Creatinine, Ser 2.48 (H) 0.61 - 1.24 mg/dL   Calcium 10.0 8.9 - 10.3 mg/dL   Total Protein 7.9 6.5 - 8.1 g/dL   Albumin 3.9 3.5 - 5.0 g/dL   AST 32 15 - 41 U/L   ALT 17 17 - 63 U/L   Alkaline Phosphatase 29 (L) 38 - 126 U/L   Total Bilirubin 0.5 0.3 - 1.2 mg/dL   GFR calc non Af Amer 22 (L) >60 mL/min   GFR calc Af Amer 26 (L) >60 mL/min   Anion gap 7 5 - 15  Lipase, blood  Result Value Ref Range   Lipase 20 11 - 51 U/L  Troponin I  Result Value Ref Range   Troponin I <0.03 <0.03 ng/mL  Lactic acid, plasma  Result Value Ref Range   Lactic Acid, Venous 1.5 0.5 - 1.9 mmol/L  Lactic acid, plasma  Result Value Ref Range   Lactic Acid, Venous 1.5 0.5 - 1.9 mmol/L  CBC with Differential  Result Value Ref Range   WBC 6.3 4.0 - 10.5 K/uL   RBC 3.46 (L) 4.22 - 5.81 MIL/uL   Hemoglobin 11.7 (L) 13.0 - 17.0 g/dL   HCT 34.5 (L) 39.0 - 52.0 %   MCV 99.7 78.0 - 100.0 fL   MCH 33.8 26.0 - 34.0  pg   MCHC 33.9 30.0 - 36.0 g/dL   RDW 15.2 11.5 - 15.5 %   Platelets 228 150 - 400 K/uL   Neutrophils Relative % 46 %   Neutro Abs 2.9 1.7 - 7.7 K/uL   Lymphocytes Relative 41 %   Lymphs Abs 2.6 0.7 - 4.0 K/uL   Monocytes Relative 10 %   Monocytes Absolute 0.6 0.1 - 1.0 K/uL   Eosinophils Relative 3 %   Eosinophils Absolute 0.2 0.0 - 0.7 K/uL   Basophils Relative 0 %   Basophils Absolute 0.0 0.0 - 0.1 K/uL  Urinalysis, Routine w reflex microscopic  Result Value Ref Range   Color, Urine YELLOW YELLOW   APPearance CLEAR CLEAR   Specific Gravity, Urine 1.020 1.005 - 1.030   pH 6.0 5.0 - 8.0   Glucose, UA NEGATIVE NEGATIVE mg/dL   Hgb urine dipstick NEGATIVE NEGATIVE   Bilirubin Urine NEGATIVE NEGATIVE   Ketones, ur NEGATIVE NEGATIVE mg/dL   Protein, ur NEGATIVE NEGATIVE mg/dL   Nitrite NEGATIVE NEGATIVE   Leukocytes, UA LARGE (A) NEGATIVE  Urine microscopic-add on  Result Value  Ref Range   Squamous Epithelial / LPF 6-30 (A) NONE SEEN   WBC, UA TOO NUMEROUS TO COUNT 0 - 5 WBC/hpf   RBC / HPF NONE SEEN 0 - 5 RBC/hpf   Bacteria, UA MANY (A) NONE SEEN   Ct Abdomen Pelvis Wo Contrast Result Date: 06/08/2016 CLINICAL DATA:  Acute onset of mid abdominal pain, nausea and decreased appetite. Elevated creatinine. Initial encounter. EXAM: CT ABDOMEN AND PELVIS WITHOUT CONTRAST TECHNIQUE: Multidetector CT imaging of the abdomen and pelvis was performed following the standard protocol without IV contrast. COMPARISON:  CT of the abdomen and pelvis from 11/05/2012, and MRI of the abdomen performed 03/30/2014 FINDINGS: Lower chest: Bilateral emphysematous change is noted, with underlying scarring and honeycombing. Diffuse coronary artery calcifications are seen. The patient is status post median sternotomy. Hepatobiliary: The liver is unremarkable in appearance. A tiny stone is noted within the gallbladder. The gallbladder is otherwise unremarkable. The common bile duct remains normal in caliber. Pancreas: The pancreas is within normal limits. Spleen: The spleen is unremarkable in appearance. Adrenals/Urinary Tract: The adrenal glands are unremarkable in appearance. Multiple bilateral renal cysts are noted, larger on the left. Mild right-sided pelvicaliectasis remains within normal limits. No renal or ureteral stones are identified. Minimal bilateral perinephric stranding is seen. Stomach/Bowel: The stomach is unremarkable in appearance. The small bowel is within normal limits. The appendix is normal in caliber, without evidence of appendicitis. Mild diverticulosis is noted along the descending and proximal sigmoid colon, without evidence of diverticulitis. Vascular/Lymphatic: Diffuse calcification is noted along the abdominal aorta and its branches. Bilateral common iliac artery stents are noted. There is mild ectasia of the distal abdominal aorta to 2.7 cm in AP dimension, without evidence of  aneurysmal dilatation. A proximal left renal artery stent is also seen. No retroperitoneal or pelvic sidewall lymphadenopathy is identified. Reproductive: The bladder is mildly distended. A right-sided bladder diverticulum is noted. The prostate remains borderline normal in size, though masslike extension is noted at the base of the bladder. A penile implant is partially imaged. Other: No additional soft tissue abnormalities are seen. Musculoskeletal: No acute osseous abnormalities are identified. Vacuum phenomenon is noted at L5-S1, with underlying facet disease at the lower lumbar spine. The visualized musculature is unremarkable in appearance. IMPRESSION: 1. No acute abnormality seen to explain the patient's symptoms. 2. Diffuse aortic atherosclerosis. Mild ectasia of the  distal abdominal aorta to 2.7 cm in AP dimension, without evidence of aneurysmal dilatation. 3. Bilateral renal cysts, larger on the left. 4. Mild diverticulosis along the descending and proximal sigmoid colon, without evidence of diverticulitis. 5. Right-sided bladder diverticulum noted. 6. Mass-like extension of the prostate into the base of the bladder. Would correlate with PSA. 7. Bilateral emphysema, with underlying scarring and honeycombing. 8. Diffuse coronary artery calcifications seen. 9. Cholelithiasis.  Gallbladder otherwise unremarkable. Electronically Signed   By: Garald Balding M.D.   On: 06/08/2016 20:29   Dg Chest 2 View Result Date: 06/08/2016 CLINICAL DATA:  Mid abdominal discomfort, nausea and decreased appetite x1 week. Bold EXAM: CHEST  2 VIEW COMPARISON:  02/02/2015 FINDINGS: The heart is normal in size. The patient is status post CABG. There is aortic atherosclerosis. Diffuse interstitial prominence is again noted without alveolar consolidations consistent with areas of fibrosis/scarring, right greater than left. No pneumonic consolidation, interstitial pulmonary edema, effusion or pneumothorax there are no suspicious  osseous lesions. IMPRESSION: No active cardiopulmonary disease. Chronic interstitial changes of the lungs likely related to areas of fibrosis Electronically Signed   By: Ashley Royalty M.D.   On: 06/08/2016 19:16    2115:  Pt has tol PO well while in the ED without N/V or choking.  No stooling while in the ED.  Abd benign, VSS. Pt is sitting on the side of the stretcher, states he "feels better" and wants to go home now. BUN/Cr per baseline. +UTI, UC pending; will dose IV rocephin. Pt remains afebrile, with normal WBC count and lactic acid x2 normal; no clear indication for admission at this time and pt does not want to be admitted. Pt's wife agrees with pt, and they want to go home now. Will tx for UTI with keflex. Dx and testing d/w pt and family.  Questions answered.  Verb understanding, agreeable to d/c home with outpt f/u.   Final Clinical Impressions(s) / ED Diagnoses   Final diagnoses:  None    New Prescriptions New Prescriptions   No medications on file      Francine Graven, DO 06/11/16 2130

## 2016-06-12 ENCOUNTER — Ambulatory Visit (HOSPITAL_COMMUNITY)
Admission: RE | Admit: 2016-06-12 | Discharge: 2016-06-12 | Disposition: A | Discharge status: Home / Self Care | Payer: Commercial Managed Care - HMO | Source: Ambulatory Visit | Attending: Gastroenterology | Admitting: Gastroenterology

## 2016-06-12 DIAGNOSIS — K224 Dyskinesia of esophagus: Secondary | ICD-10-CM | POA: Insufficient documentation

## 2016-06-12 DIAGNOSIS — R1310 Dysphagia, unspecified: Secondary | ICD-10-CM | POA: Diagnosis not present

## 2016-06-12 LAB — URINE CULTURE

## 2016-06-13 ENCOUNTER — Telehealth (HOSPITAL_BASED_OUTPATIENT_CLINIC_OR_DEPARTMENT_OTHER): Payer: Self-pay | Admitting: Emergency Medicine

## 2016-06-13 NOTE — Telephone Encounter (Signed)
Post ED Visit - Positive Culture Follow-up: Successful Patient Follow-Up  Culture assessed and recommendations reviewed by: []  Elenor Quinones, Pharm.D. []  Heide Guile, Pharm.D., BCPS []  Parks Neptune, Pharm.D. []  Alycia Rossetti, Pharm.D., BCPS []  [place], [place].D., BCPS, AAHIVP []  Legrand Como, Pharm.D., BCPS, AAHIVP []  Milus Glazier, Pharm.D. []  Stephens November, Pharm.D. Belia Heman PharmD  Positive urine culture  []  Patient discharged without antimicrobial prescription and treatment is now indicated [x]  Organism is resistant to prescribed ED discharge antimicrobial []  Patient with positive blood cultures  Changes discussed with ED provider: Will Porfirio Mylar PA New antibiotic prescription stop keflex, start amoxicillin 500mg  po bid x 7 days Called to [place], wife @ [place], [place] 06/13/2016, 3:14 PM

## 2016-06-13 NOTE — Progress Notes (Signed)
ED Antimicrobial Stewardship Positive Culture Follow Up   [name] [name] is an 82 y.o. male who presented to [facility] on 06/08/2016 with a chief complaint of  Chief Complaint  Patient presents with  . Abdominal Pain    Recent Results (from the past 720 hour(s))  Urine culture     Status: Abnormal   Collection Time: 06/08/16  8:20 PM  Result Value Ref Range Status   Specimen Description URINE, CLEAN CATCH  Final   Special Requests NONE  Final   Culture >=100,000 COLONIES/mL ENTEROCOCCUS FAECALIS (A)  Final   Report Status 06/12/2016 FINAL  Final   Organism ID, Bacteria ENTEROCOCCUS FAECALIS (A)  Final      Susceptibility   Enterococcus faecalis - MIC*    AMPICILLIN <=2 SENSITIVE Sensitive     LEVOFLOXACIN 0.5 SENSITIVE Sensitive     NITROFURANTOIN <=16 SENSITIVE Sensitive     VANCOMYCIN 2 SENSITIVE Sensitive     * >=100,000 COLONIES/mL ENTEROCOCCUS FAECALIS    []  Treated with Keflex (cephalexin), organism resistant to prescribed antimicrobial  New antibiotic prescription: Amoxicillin 500 mg po BID x 7 days  ED Provider: Wayne Both, PA-C  Belia Heman, PharmD PGY1 Pharmacy Resident [phone] (Pager) 06/13/2016 8:44 AM

## 2016-06-15 DIAGNOSIS — J441 Chronic obstructive pulmonary disease with (acute) exacerbation: Secondary | ICD-10-CM | POA: Diagnosis not present

## 2016-06-15 DIAGNOSIS — J449 Chronic obstructive pulmonary disease, unspecified: Secondary | ICD-10-CM | POA: Diagnosis not present

## 2016-06-15 DIAGNOSIS — J9611 Chronic respiratory failure with hypoxia: Secondary | ICD-10-CM | POA: Diagnosis not present

## 2016-06-18 ENCOUNTER — Encounter (HOSPITAL_COMMUNITY): Payer: Commercial Managed Care - HMO

## 2016-06-19 ENCOUNTER — Ambulatory Visit (HOSPITAL_COMMUNITY): Payer: Commercial Managed Care - HMO | Admitting: Speech Pathology

## 2016-06-19 ENCOUNTER — Ambulatory Visit (HOSPITAL_COMMUNITY): Payer: Commercial Managed Care - HMO

## 2016-06-19 ENCOUNTER — Encounter (HOSPITAL_COMMUNITY): Payer: Self-pay

## 2016-06-22 ENCOUNTER — Other Ambulatory Visit: Payer: Self-pay

## 2016-06-22 NOTE — Patient Outreach (Signed)
[facility]) Care Management  [place]  06/22/2016   RAZEEN LAUDADIO [birth year] [id number]  Subjective: Telephone call to caregiver wife for monthly call.  Wife reports that patient went to the ER since last conversation due to nausea.  She reports that patient had a urinary tract infection and has finished antibiotics and is some better.  However, she shares that patient had a swallowing test done and that patient's liquids are going into his lungs some.  Also reports that patient is to have a speech test next week and follow up with the physician after completing tests.  Discussed with wife patient swallowing issues and different things she could do to assist patient to not aspirate as much.  She verbalized understanding.  Also discussed with her EMMI aspiration pneumonia information and will send written EMMI information. She verbalized understanding.  She also reports that his Aricept and namenda have been stopped for now and patient having some increased forgetfulness. However, will be discussing patient medications on next physician visit.   Encouraged wife to ask questions during physician visit in order to help her take care of patient.  She verbalized understanding.    Objective:   Encounter Medications:  Outpatient Encounter Prescriptions as of 06/22/2016  Medication Sig Note  . acetaminophen (TYLENOL) 500 MG tablet Take 1,000 mg by mouth every 6 (six) hours as needed for headache.   Marland Kitchen aspirin 81 MG tablet Take 1 tablet (81 mg total) by mouth daily.   . calcitRIOL (ROCALTROL) 0.5 MCG capsule Take 0.5 mcg by mouth daily.   . Cholecalciferol (VITAMIN D) 2000 units CAPS Take 1 capsule by mouth every other day.   . feeding supplement, ENSURE ENLIVE, (ENSURE ENLIVE) LIQD Take 237 mLs by mouth 2 (two) times daily between meals.   . fenofibrate 160 MG tablet Take 1 tablet (160 mg total) by mouth daily.   . ferrous sulfate 325 (65 FE) MG tablet Take 325 mg by mouth  every morning.    . finasteride (PROSCAR) 5 MG tablet Take 5 mg by mouth every morning.    . fluticasone (FLONASE) 50 MCG/ACT nasal spray Place 2 sprays into both nostrils daily as needed for allergies.    Marland Kitchen gabapentin (NEURONTIN) 300 MG capsule Take 1 capsule (300 mg total) by mouth 3 (three) times daily. (Patient taking differently: Take 300 mg by mouth every morning. )   . guaiFENesin (MUCUS RELIEF CHEST CONGESTION) 100 MG/5ML liquid Take 200 mg by mouth 3 (three) times daily as needed for cough. Reported on 01/25/2016   . guaiFENesin-dextromethorphan (ROBITUSSIN DM) 100-10 MG/5ML syrup Take 5 mLs by mouth every 4 (four) hours as needed for cough.   Marland Kitchen ipratropium (ATROVENT) 0.06 % nasal spray Place 1 spray into both nostrils 4 (four) times daily.   Marland Kitchen ipratropium-albuterol (DUONEB) 0.5-2.5 (3) MG/3ML SOLN Take 3 mLs by nebulization every 4 (four) hours as needed. (Patient taking differently: Take 3 mLs by nebulization every 4 (four) hours as needed (for shortness of breath). )   . lansoprazole (PREVACID) 30 MG capsule Take 30 mg by mouth daily.    Marland Kitchen latanoprost (XALATAN) 0.005 % ophthalmic solution Place 1 drop into both eyes at bedtime.    Marland Kitchen lubiprostone (AMITIZA) 24 MCG capsule Take 24 mcg by mouth daily as needed for constipation.    . metoCLOPramide (REGLAN) 10 MG tablet Take 10 mg by mouth 2 (two) times daily. Reported on 09/30/2015   . metoprolol succinate (TOPROL-XL) 25 MG 24 hr  tablet Take 1 tablet (25 mg total) by mouth daily.   . mirtazapine (REMERON) 30 MG tablet Take 30 mg by mouth at bedtime.   . Multiple Vitamin (MULTIVITAMIN WITH MINERALS) TABS tablet Take 1 tablet by mouth daily.   . nitroGLYCERIN (NITROSTAT) 0.4 MG SL tablet Place 1 tablet (0.4 mg total) under the tongue every 5 (five) minutes as needed for chest pain.   Marland Kitchen ondansetron (ZOFRAN) 4 MG tablet Take 1 tablet (4 mg total) by mouth every 8 (eight) hours as needed for nausea or vomiting.   . OXYGEN Inhale 2 L into the lungs  at bedtime. 2lpm with sleep   . polyethylene glycol (MIRALAX / GLYCOLAX) packet Take 17 g by mouth daily as needed for moderate constipation.    . rosuvastatin (CRESTOR) 40 MG tablet Take 1 tablet (40 mg total) by mouth daily.   . Tamsulosin HCl (FLOMAX) 0.4 MG CAPS Take 0.8 mg by mouth at bedtime.    . vitamin B-12 (CYANOCOBALAMIN) 1000 MCG tablet Take 1,000 mcg by mouth daily.   . cephALEXin (KEFLEX) 500 MG capsule Take 1 capsule (500 mg total) by mouth 4 (four) times daily. (Patient not taking: Reported on 06/22/2016)   . donepezil (ARICEPT) 10 MG tablet Take 1 tablet (10 mg total) by mouth at bedtime. (Patient not taking: Reported on 06/22/2016) 06/08/2016: HOLD per MD orders per spouse  . memantine (NAMENDA) 10 MG tablet Take 1 tablet (10 mg total) by mouth 2 (two) times daily. (Patient not taking: Reported on 06/22/2016)    No facility-administered encounter medications on file as of 06/22/2016.     Functional Status:  In your present state of health, do you have any difficulty performing the following activities: 02/28/2016 01/26/2016  Hearing? N -  Vision? Y -  Difficulty concentrating or making decisions? Y -  Walking or climbing stairs? Y -  Dressing or bathing? Y -  Doing errands, shopping? Y -  Conservation officer, nature and eating ? Y -  Using the Toilet? N -  In the past six months, have you accidently leaked urine? Y -  Do you have problems with loss of bowel control? Y (No Data)  Managing your Medications? Y -  Managing your Finances? Y -  Housekeeping or managing your Housekeeping? Y -  Some recent data might be hidden    Fall/Depression Screening: PHQ 2/9 Scores 06/22/2016 05/24/2016 04/23/2016 03/29/2016 02/28/2016 01/13/2016 12/16/2015  PHQ - 2 Score 0 0 0 0 0 - -  Exception Documentation - - - - - Medical reason Medical reason    Assessment: Patient continues to benefit from health coach outreach for disease management and support.    Plan:  [facility] CM Care Plan Problem One   Flowsheet  Row Most Recent Value  Care Plan Problem One  COPD knowledge deficit  Role Documenting the Problem One  Health Coach  Care Plan for Problem One  Active  [facility] Long Term Goal (31-90 days)  Caregiver will be able to verbalize action plan for COPD exacerbation in the next 90 days  [facility] Long Term Goal Start Date  06/22/16 [goal continued]  Interventions for Problem One Long Term Goal  RN Health Coach reiterated with caregiver signs and symptoms of COPD and when to notify physician.      [facility] CM Care Plan Problem Two   Flowsheet Row Most Recent Value  Care Plan Problem Two  Impaired Swallowing  Role Documenting the Problem Two  [place] for  Problem Two  Active  Interventions for Problem Two Long Term Goal   RN Health Coach discussed with caregiver chin tuck method when patient is swallowing and not using a straw for drinking.  Also discussed signs and aspiration pneumonia and when to notify physician.    [facility] Long Term Goal (31-90) days  Caregiver will verbalize utilizing interventions to minimize aspiration within the next 60 days.  [facility] Long Term Goal Start Date  06/22/16     RN Health Coach will contact patient/caregiver in the month of January and patient/caregiver agrees to next outreach.  [name], RN, MSN [place] [phone]

## 2016-06-26 ENCOUNTER — Ambulatory Visit (HOSPITAL_COMMUNITY): Payer: Commercial Managed Care - HMO | Attending: Gastroenterology | Admitting: Speech Pathology

## 2016-06-26 ENCOUNTER — Ambulatory Visit: Payer: Commercial Managed Care - HMO | Admitting: Vascular Surgery

## 2016-06-26 ENCOUNTER — Ambulatory Visit (HOSPITAL_COMMUNITY)
Admission: RE | Admit: 2016-06-26 | Discharge: 2016-06-26 | Disposition: A | Discharge status: Home / Self Care | Payer: Commercial Managed Care - HMO | Source: Ambulatory Visit | Attending: Gastroenterology | Admitting: Gastroenterology

## 2016-06-26 ENCOUNTER — Other Ambulatory Visit (HOSPITAL_COMMUNITY): Payer: Self-pay | Admitting: Speech Pathology

## 2016-06-26 ENCOUNTER — Encounter (HOSPITAL_COMMUNITY): Payer: Self-pay | Admitting: Speech Pathology

## 2016-06-26 DIAGNOSIS — D472 Monoclonal gammopathy: Secondary | ICD-10-CM | POA: Insufficient documentation

## 2016-06-26 DIAGNOSIS — G8929 Other chronic pain: Secondary | ICD-10-CM | POA: Diagnosis not present

## 2016-06-26 DIAGNOSIS — I129 Hypertensive chronic kidney disease with stage 1 through stage 4 chronic kidney disease, or unspecified chronic kidney disease: Secondary | ICD-10-CM | POA: Insufficient documentation

## 2016-06-26 DIAGNOSIS — R1312 Dysphagia, oropharyngeal phase: Secondary | ICD-10-CM

## 2016-06-26 DIAGNOSIS — K219 Gastro-esophageal reflux disease without esophagitis: Secondary | ICD-10-CM | POA: Diagnosis not present

## 2016-06-26 DIAGNOSIS — Z9889 Other specified postprocedural states: Secondary | ICD-10-CM | POA: Insufficient documentation

## 2016-06-26 DIAGNOSIS — R413 Other amnesia: Secondary | ICD-10-CM | POA: Diagnosis not present

## 2016-06-26 DIAGNOSIS — J9611 Chronic respiratory failure with hypoxia: Secondary | ICD-10-CM | POA: Diagnosis not present

## 2016-06-26 DIAGNOSIS — G309 Alzheimer's disease, unspecified: Secondary | ICD-10-CM | POA: Insufficient documentation

## 2016-06-26 DIAGNOSIS — Z9981 Dependence on supplemental oxygen: Secondary | ICD-10-CM | POA: Insufficient documentation

## 2016-06-26 DIAGNOSIS — N183 Chronic kidney disease, stage 3 (moderate): Secondary | ICD-10-CM | POA: Diagnosis not present

## 2016-06-26 DIAGNOSIS — E785 Hyperlipidemia, unspecified: Secondary | ICD-10-CM | POA: Insufficient documentation

## 2016-06-26 DIAGNOSIS — I6529 Occlusion and stenosis of unspecified carotid artery: Secondary | ICD-10-CM | POA: Diagnosis not present

## 2016-06-26 DIAGNOSIS — I739 Peripheral vascular disease, unspecified: Secondary | ICD-10-CM | POA: Diagnosis not present

## 2016-06-26 DIAGNOSIS — F418 Other specified anxiety disorders: Secondary | ICD-10-CM | POA: Insufficient documentation

## 2016-06-26 DIAGNOSIS — J449 Chronic obstructive pulmonary disease, unspecified: Secondary | ICD-10-CM | POA: Insufficient documentation

## 2016-06-26 DIAGNOSIS — F0280 Dementia in other diseases classified elsewhere without behavioral disturbance: Secondary | ICD-10-CM | POA: Insufficient documentation

## 2016-06-26 DIAGNOSIS — Z951 Presence of aortocoronary bypass graft: Secondary | ICD-10-CM | POA: Insufficient documentation

## 2016-06-26 DIAGNOSIS — R0789 Other chest pain: Secondary | ICD-10-CM | POA: Insufficient documentation

## 2016-06-26 DIAGNOSIS — I2510 Atherosclerotic heart disease of native coronary artery without angina pectoris: Secondary | ICD-10-CM | POA: Diagnosis not present

## 2016-06-26 DIAGNOSIS — N400 Enlarged prostate without lower urinary tract symptoms: Secondary | ICD-10-CM | POA: Insufficient documentation

## 2016-06-26 DIAGNOSIS — H409 Unspecified glaucoma: Secondary | ICD-10-CM | POA: Insufficient documentation

## 2016-06-26 DIAGNOSIS — T17300D Unspecified foreign body in larynx causing asphyxiation, subsequent encounter: Secondary | ICD-10-CM | POA: Diagnosis not present

## 2016-06-26 NOTE — Therapy (Signed)
[place], [place], [address] Phone: [phone]   Fax:  [phone]  Modified Barium Swallow  Patient Details  Name: [name] [name] MRN: [id number] Date of Birth: 05-23-1931 No Data Recorded  Encounter Date: 06/26/2016      End of Session - 06/26/16 1630    Visit Number 1   Number of Visits 1   Authorization Type [employer]   SLP Start Time [id number]   SLP Stop Time  1515   SLP Time Calculation (min) 37 min   Activity Tolerance Patient tolerated treatment well      Past Medical History:  Diagnosis Date  . Alzheimer disease   . Anemia   . BPH (benign prostatic hyperplasia)   . CAD (coronary artery disease)    s/p CABG in 1997 with known atretic LIMA to LAD and 40% LM.    Marland Kitchen Carotid artery stenosis    s/p left CEA.  Carotid dopplers 09/2013 60-79% right ICA stenosis and 1-39% left ICA stenosis of left CEA  . Chronic chest wall pain   . Chronic kidney disease (CKD), stage III (moderate)   . COPD (chronic obstructive pulmonary disease) ([place])   . Cough    cough with productive white to yellow sputum.  . DEMENTIA    "short term memory issues"  . Depression with anxiety   . GERD (gastroesophageal reflux disease)   . Glaucoma    bilateral  . Hyperlipemia   . Hypertension   . Memory loss   . Monoclonal gammopathy   . Neuromuscular disorder (HCC)    fingertips numbness"  . On home oxygen therapy    at bedtime-2 L/m nasally..    . Peptic ulcer disease   . PVD (peripheral vascular disease) ([place])    s/p left renal artery stent and left iliac stent  . Retinal artery occlusion     Past Surgical History:  Procedure Laterality Date  . BACK SURGERY  2011   lower back -disc  . CARDIAC SURGERY    . CATARACT EXTRACTION W/PHACO  09/18/2011   Procedure: CATARACT EXTRACTION PHACO AND INTRAOCULAR LENS PLACEMENT (IOC);  Surgeon: Elta Guadeloupe T. Gershon Crane, MD;  Location: [facility];  Service: Ophthalmology;  Laterality: Right;   CDE: 6.19  . COLONOSCOPY WITH PROPOFOL N/A 07/11/2015   Procedure: COLONOSCOPY WITH PROPOFOL;  Surgeon: Laurence Spates, MD;  Location: [facility];  Service: Endoscopy;  Laterality: N/A;  . CORONARY ARTERY BYPASS GRAFT  1997   "nerve pain at area"  . ESOPHAGOGASTRODUODENOSCOPY (EGD) WITH PROPOFOL N/A 07/11/2015   Procedure: ESOPHAGOGASTRODUODENOSCOPY (EGD) WITH PROPOFOL;  Surgeon: Laurence Spates, MD;  Location: [facility];  Service: Endoscopy;  Laterality: N/A;  . Left CEA  2002   carotid stent-left-   . RENAL ARTERY STENT      There were no vitals filed for this visit.      Subjective Assessment - 06/26/16 1613    Subjective "He's had trouble swallowing for a while."   Patient is accompained by: Family member   Special Tests MBSS   Currently in Pain? No/denies             General - 06/26/16 1614      General Information   Date of Onset 06/12/16   HPI [name] is an 82 yo male who was referred by Dr. Laurence Spates for MBSS following a barium swallow study at the end of Novemeber which showed aspiration. The patient lives at home with his  wife and has a history of dementia (2007). His wife reports that the pt was seen by Dr. Krista Blue in the past, but is now followed by his PCP for medication management of his memory impairment. His wife reports that Pt has always been a picky eater and has had trouble swallowing in the past few months.    Type of Study MBS-Modified Barium Swallow Study   Diet Prior to this Study Regular;Thin liquids   Temperature Spikes Noted No   Respiratory Status Room air   History of Recent Intubation No   Behavior/Cognition Alert;Cooperative;Pleasant mood   Oral Cavity Assessment Within Functional Limits   Oral Care Completed by SLP No   Oral Cavity - Dentition Dentures, top;Dentures, bottom   Vision Functional for self feeding   Self-Feeding Abilities Able to feed self   Patient Positioning Upright in chair   Baseline Vocal Quality Normal;Breathy    Volitional Cough Strong   Volitional Swallow Unable to elicit   Anatomy Within functional limits   Pharyngeal Secretions Not observed secondary MBS            Oral Preparation/Oral Phase - 06/26/16 1622      Oral Preparation/Oral Phase   Oral Phase Impaired     Oral - Nectar   Oral - Nectar Cup Delayed A-P transit;Oral residue     Oral - Thin   Oral - Thin Cup Delayed A-P transit;Oral residue     Oral - Solids   Oral - Puree Delayed A-P transit;Oral residue;Piecemeal swallowing;Decreased bolus cohesion   Oral - Regular Decreased bolus cohesion;Delayed A-P transit;Oral residue;Piecemeal swallowing   Oral - Pill Delayed A-P transit;Piecemeal swallowing     Electrical stimulation - Oral Phase   Was Electrical Stimulation Used No          Pharyngeal Phase - 06/26/16 1623      Pharyngeal Phase   Pharyngeal Phase Impaired     Pharyngeal - Honey   Pharyngeal- Honey Teaspoon Delayed swallow initiation;Swallow initiation at vallecula;Reduced tongue base retraction;Pharyngeal residue - valleculae     Pharyngeal - Nectar   Pharyngeal- Nectar Cup Delayed swallow initiation;Swallow initiation at pyriform sinus;Pharyngeal residue - valleculae;Reduced tongue base retraction;Lateral channel residue   Pharyngeal- Nectar Straw Delayed swallow initiation;Swallow initiation at pyriform sinus;Pharyngeal residue - valleculae;Reduced tongue base retraction;Lateral channel residue     Pharyngeal - Thin   Pharyngeal- Thin Teaspoon Delayed swallow initiation;Swallow initiation at vallecula;Pharyngeal residue - valleculae   Pharyngeal- Thin Cup Delayed swallow initiation;Swallow initiation at pyriform sinus;Reduced airway/laryngeal closure;Reduced tongue base retraction;Penetration/Aspiration during swallow;Penetration/Apiration after swallow;Trace aspiration;Moderate aspiration;Pharyngeal residue - valleculae;Pharyngeal residue - pyriform;Lateral channel residue;Compensatory strategies  attempted (with notebox)   Pharyngeal Material enters airway, remains ABOVE vocal cords then ejected out;Material enters airway, remains ABOVE vocal cords and not ejected out;Material enters airway, passes BELOW cords without attempt by patient to eject out (silent aspiration)     Pharyngeal - Solids   Pharyngeal- Puree Swallow initiation at vallecula;Reduced tongue base retraction;Pharyngeal residue - valleculae   Pharyngeal- Regular Delayed swallow initiation-vallecula;Reduced laryngeal elevation   Pharyngeal- Pill Swallow initiation at vallecula     Electrical Stimulation - Pharyngeal Phase   Was Electrical Stimulation Used No          Cricopharyngeal Phase - 06/26/16 1627      Cervical Esophageal Phase   Cervical Esophageal Phase Within functional limits           Plan - 06/26/16 1631    Clinical Impression Statement Pt seen for MBSS while  sitting upright in [place] chair in lateral position. Pt presents with moderate oropharyngeal dysphagia with both sensory and motor components. Oral phase is characterized by prolonged oral transit with solids and pill with decreased anterior posterior transit. Pharyngeal phase is marked by significant delay in swallow initiation. Pt with propelled puree bolus posteriorly until it rested near base of tongue (but had not yet reached epiglottis) and velum retracted several times in preparation for swallow, but no swallow occurred. SLP verbally prompted Pt to "swallow" several times with no response. SLP then asked Pt an off topic question to which he promptly swallowed and answered. Thin liquids spilled to pyriforms and were penetrated before the swallow was initiated and aspirated during and after the swallow triggered without benefit of cough/protective response. SLP then cued Pt to cough, which he was able to produce, but it was not effective in clearing tracheal aspirate. Chin tuck posture was implemented and not effective and again resulted in  aspiration. Controlled bolus size (teaspoon presentation) was effective in reducing, but not eliminating penetration and was effective in preventing aspiration on 2/2 trials. Nectar-thick liquids were better tolerated, however swallow delay still significant (trace penetration and NO aspiration) and resulted in increased pyriform and lateral channel residue after the initial swallow. Pt benefited from double swallow to clear residuals. Pt best tolerated honey-thick liquids (HTL) which resulted in swallow trigger after filling the valleculae and only trace residuals post swallow. Pt required additional time to masticate and prepare solids orally, but once swallow triggered (at valleculae), no residuals noted in pharynx. Pt penetrated the NTL when swallowing barium tablet. Pt's risk for aspiration is high with thin liquids due to significant delay in swallow initiation and Pt's decreased ability to monitor for small sips. Risks are reduced with thicker liquids, however this may increase his risk for dehydration and potentially aspiration PNA should he aspirate the thickened liquids. Recommend D3/mech soft with nectar-thick liquids (NTL) and follow up dysphagia intervention for pt/caregiver training, diet tolerance, and implementation of swallow strategies. Pt may better tolerate HTL during meals and consider allowing teaspoon presentation of thin liquids between meals if presented by trained caregiver. This swallow study was reviewed with Pt and wife and they were provided with written recommendations. Recommend f/u SLP services for dysphagia intervention via [facility] or outpatient. Wife in agreement with plan.    Treatment/Interventions Diet toleration management by SLP;Pharyngeal strengthening exercises;Cueing hierarchy;SLP instruction and feedback;Patient/family education;Trials of upgraded texture/liquids;Compensatory techniques   Potential to Achieve Goals Fair   Potential Considerations Ability to learn/carryover  information   Consulted and Agree with Plan of Care Patient      Patient will benefit from skilled therapeutic intervention in order to improve the following deficits and impairments:   Dysphagia, oropharyngeal phase      G-Codes - 07-20-16 1632    Functional Assessment Tool Used MBSS; clinical judgment   Functional Limitations Swallowing   Swallow Current Status [id number]) At least 40 percent but less than 60 percent impaired, limited or restricted   Swallow Goal Status [id number]) At least 1 percent but less than 20 percent impaired, limited or restricted   Swallow Discharge Status [phone]) At least 40 percent but less than 60 percent impaired, limited or restricted          Recommendations/Treatment - 2016-07-20 1627      Swallow Evaluation Recommendations   SLP Diet Recommendations Dysphagia 3 (mechanical soft);Nectar   Thickener user Powder  Thicken Up Clear   Liquid Administration via Cup;Spoon;No straw  Medication Administration Whole meds with puree  or whole meds with NTL   Supervision Patient able to self feed;Full supervision/cueing for compensatory strategies   Compensations Small sips/bites;Clear throat intermittently   Postural Changes Seated upright at 90 degrees;Remain upright for at least 30 minutes after feeds/meals          Prognosis - 06/26/16 1629      Prognosis   Prognosis for Safe Diet Advancement Fair   Barriers to Reach Goals Cognitive deficits     Individuals Consulted   Consulted and Agree with Results and Recommendations Patient;Family member/caregiver   Family Member Consulted Pt and wife   Report Sent to  Referring physician  PCP      Problem List Patient Active Problem List   Diagnosis Date Noted  . Dysphagia 12/13/2015  . Anorexia 12/13/2015  . CAP (community acquired pneumonia) 12/13/2015  . History of thrush 12/13/2015  . Hypertension, essential 12/13/2015  . Dementia 12/13/2015  . Upper airway cough syndrome 09/01/2015  . COPD  GOLD II 04/26/2015  . Chronic respiratory failure with hypoxia ([place]) 02/25/2015  . Carotid stenosis, asymptomatic 01/25/2015  . Delirium 10/15/2014  . Chronic chest wall pain   . Paresthesia 12/22/2013  . PVD (peripheral vascular disease) ([place])   . GERD (gastroesophageal reflux disease)   . CAD (coronary artery disease)   . Alzheimer disease   . Memory loss   . PAIN IN JOINT, MULTIPLE SITES 04/19/2009  . Allergic rhinitis 08/16/2008  . Dyslipidemia 03/26/2007  . MONOCLONAL GAMMOPATHY 03/26/2007  . CKD (chronic kidney disease) stage 3, GFR 30-59 ml/min 03/26/2007   Thank you,  Genene Churn, [place]  [facility] 06/26/2016, 4:33 PM  [place] [address] [place], [place], [address] Phone: [phone]   Fax:  [phone]  Name: ALGER ASTON MRN: [id number] Date of Birth: [birth year]

## 2016-06-28 ENCOUNTER — Telehealth (HOSPITAL_COMMUNITY): Payer: Self-pay | Admitting: Family Medicine

## 2016-06-28 NOTE — Telephone Encounter (Signed)
06/28/16 called to schedule him 1 x week for 4 weeks per [place].... I left a message asking him to call us back

## 2016-06-29 ENCOUNTER — Encounter (HOSPITAL_COMMUNITY): Payer: Self-pay

## 2016-06-29 ENCOUNTER — Emergency Department (HOSPITAL_COMMUNITY)
Admission: EM | Admit: 2016-06-29 | Discharge: 2016-06-29 | Disposition: A | Discharge status: Home / Self Care | Payer: Commercial Managed Care - HMO | Attending: Emergency Medicine | Admitting: Emergency Medicine

## 2016-06-29 ENCOUNTER — Emergency Department (HOSPITAL_COMMUNITY): Payer: Commercial Managed Care - HMO

## 2016-06-29 DIAGNOSIS — Z7982 Long term (current) use of aspirin: Secondary | ICD-10-CM | POA: Insufficient documentation

## 2016-06-29 DIAGNOSIS — R079 Chest pain, unspecified: Secondary | ICD-10-CM | POA: Diagnosis not present

## 2016-06-29 DIAGNOSIS — R072 Precordial pain: Secondary | ICD-10-CM | POA: Diagnosis not present

## 2016-06-29 DIAGNOSIS — Z87891 Personal history of nicotine dependence: Secondary | ICD-10-CM | POA: Insufficient documentation

## 2016-06-29 DIAGNOSIS — Z79899 Other long term (current) drug therapy: Secondary | ICD-10-CM | POA: Diagnosis not present

## 2016-06-29 DIAGNOSIS — R4182 Altered mental status, unspecified: Secondary | ICD-10-CM | POA: Diagnosis not present

## 2016-06-29 DIAGNOSIS — G309 Alzheimer's disease, unspecified: Secondary | ICD-10-CM | POA: Insufficient documentation

## 2016-06-29 DIAGNOSIS — N183 Chronic kidney disease, stage 3 (moderate): Secondary | ICD-10-CM | POA: Insufficient documentation

## 2016-06-29 DIAGNOSIS — I2510 Atherosclerotic heart disease of native coronary artery without angina pectoris: Secondary | ICD-10-CM | POA: Diagnosis not present

## 2016-06-29 DIAGNOSIS — R531 Weakness: Secondary | ICD-10-CM

## 2016-06-29 DIAGNOSIS — N189 Chronic kidney disease, unspecified: Secondary | ICD-10-CM

## 2016-06-29 DIAGNOSIS — J449 Chronic obstructive pulmonary disease, unspecified: Secondary | ICD-10-CM | POA: Insufficient documentation

## 2016-06-29 DIAGNOSIS — I129 Hypertensive chronic kidney disease with stage 1 through stage 4 chronic kidney disease, or unspecified chronic kidney disease: Secondary | ICD-10-CM | POA: Diagnosis not present

## 2016-06-29 LAB — CBC
HCT: 32 % — ABNORMAL LOW (ref 39.0–52.0)
HEMOGLOBIN: 10.5 g/dL — AB (ref 13.0–17.0)
MCH: 33 pg (ref 26.0–34.0)
MCHC: 32.8 g/dL (ref 30.0–36.0)
MCV: 100.6 fL — ABNORMAL HIGH (ref 78.0–100.0)
PLATELETS: 232 10*3/uL (ref 150–400)
RBC: 3.18 MIL/uL — AB (ref 4.22–5.81)
RDW: 15 % (ref 11.5–15.5)
WBC: 6.8 10*3/uL (ref 4.0–10.5)

## 2016-06-29 LAB — URINALYSIS, ROUTINE W REFLEX MICROSCOPIC
BILIRUBIN URINE: NEGATIVE
Glucose, UA: NEGATIVE mg/dL
Hgb urine dipstick: NEGATIVE
Ketones, ur: NEGATIVE mg/dL
Leukocytes, UA: NEGATIVE
NITRITE: NEGATIVE
PH: 6 (ref 5.0–8.0)
Protein, ur: NEGATIVE mg/dL
SPECIFIC GRAVITY, URINE: 1.015 (ref 1.005–1.030)

## 2016-06-29 LAB — BASIC METABOLIC PANEL
ANION GAP: 7 (ref 5–15)
BUN: 25 mg/dL — ABNORMAL HIGH (ref 6–20)
CALCIUM: 9.7 mg/dL (ref 8.9–10.3)
CO2: 27 mmol/L (ref 22–32)
CREATININE: 2.67 mg/dL — AB (ref 0.61–1.24)
Chloride: 104 mmol/L (ref 101–111)
GFR, EST AFRICAN AMERICAN: 24 mL/min — AB (ref >60)
GFR, EST NON AFRICAN AMERICAN: 21 mL/min — AB (ref >60)
Glucose, Bld: 105 mg/dL — ABNORMAL HIGH (ref 65–99)
Potassium: 4.4 mmol/L (ref 3.5–5.1)
Sodium: 138 mmol/L (ref 135–145)

## 2016-06-29 LAB — RAPID URINE DRUG SCREEN, HOSP PERFORMED
AMPHETAMINES: NOT DETECTED
BENZODIAZEPINES: NOT DETECTED
Barbiturates: NOT DETECTED
Cocaine: NOT DETECTED
OPIATES: NOT DETECTED
Tetrahydrocannabinol: NOT DETECTED

## 2016-06-29 LAB — TROPONIN I

## 2016-06-29 NOTE — Discharge Instructions (Signed)

## 2016-06-29 NOTE — ED Triage Notes (Signed)
Patient was also given a 325 mg ASA.

## 2016-06-29 NOTE — ED Triage Notes (Signed)
Patient complaining of chest pain and headache.  Was given two nitro at home.  His blood pressure was okay at home.  Patient states that his chest was hurting on the right side.

## 2016-06-29 NOTE — ED Provider Notes (Signed)
[facility] DEPT Provider Note   CSN: [id number] Arrival date & time: 06/29/16  0024  By signing my name below, I, Gwenlyn Fudge, attest that this documentation has been prepared under the direction and in the presence of Ripley Fraise, MD. Electronically Signed: Gwenlyn Fudge, ED Scribe. 06/29/16. 12:57 AM.   History   Chief Complaint Chief Complaint  Patient presents with  . Chest Pain   LEVEL 5 CAVEAT: HPI and ROS limited due to Dementia  HPI Comments: [name] [name] is a 82 y.o. male who presents to the Emergency Department complaining of gradual onset, constant right sided chest pain onset earlier tonight. Pt's wife gave him 2 nitroglycerins at home. Pt has associated trouble swallowing, leg pain and headache. His wife denies PMHx of MI, but states pt had open heart surgery. Pt is slightly disoriented intermittently for the past few days. No recent falls or head injuries. Wife denies fever and vomiting.  Pt was seen here 3 weeks ago for bladder infection and has finished course of antibiotics.   The history is provided by the spouse. No language interpreter was used.  Chest Pain   This is a new problem. The problem occurs constantly. The problem has not changed since onset.The pain is present in the substernal region. The pain is moderate. Associated symptoms include headaches. He has tried nitroglycerin for the symptoms. Risk factors include being elderly and male gender.  His past medical history is significant for CAD, hyperlipidemia and hypertension.  Pertinent negatives for past medical history include no MI.    Past Medical History:  Diagnosis Date  . Alzheimer disease   . Anemia   . BPH (benign prostatic hyperplasia)   . CAD (coronary artery disease)    s/p CABG in 1997 with known atretic LIMA to LAD and 40% LM.    Marland Kitchen Carotid artery stenosis    s/p left CEA.  Carotid dopplers 09/2013 60-79% right ICA stenosis and 1-39% left ICA stenosis of left CEA  . Chronic chest  wall pain   . Chronic kidney disease (CKD), stage III (moderate)   . COPD (chronic obstructive pulmonary disease) ([place])   . Cough    cough with productive white to yellow sputum.  . DEMENTIA    "short term memory issues"  . Depression with anxiety   . GERD (gastroesophageal reflux disease)   . Glaucoma    bilateral  . Hyperlipemia   . Hypertension   . Memory loss   . Monoclonal gammopathy   . Neuromuscular disorder (HCC)    fingertips numbness"  . On home oxygen therapy    at bedtime-2 L/m nasally..    . Peptic ulcer disease   . PVD (peripheral vascular disease) ([place])    s/p left renal artery stent and left iliac stent  . Retinal artery occlusion     Patient Active Problem List   Diagnosis Date Noted  . Dysphagia 12/13/2015  . Anorexia 12/13/2015  . CAP (community acquired pneumonia) 12/13/2015  . History of thrush 12/13/2015  . Hypertension, essential 12/13/2015  . Dementia 12/13/2015  . Upper airway cough syndrome 09/01/2015  . COPD GOLD II 04/26/2015  . Chronic respiratory failure with hypoxia ([place]) 02/25/2015  . Carotid stenosis, asymptomatic 01/25/2015  . Delirium 10/15/2014  . Chronic chest wall pain   . Paresthesia 12/22/2013  . PVD (peripheral vascular disease) ([place])   . GERD (gastroesophageal reflux disease)   . CAD (coronary artery disease)   . Alzheimer disease   . Memory loss   .  PAIN IN JOINT, MULTIPLE SITES 04/19/2009  . Allergic rhinitis 08/16/2008  . Dyslipidemia 03/26/2007  . MONOCLONAL GAMMOPATHY 03/26/2007  . CKD (chronic kidney disease) stage 3, GFR 30-59 ml/min 03/26/2007    Past Surgical History:  Procedure Laterality Date  . BACK SURGERY  2011   lower back -disc  . CARDIAC SURGERY    . CATARACT EXTRACTION W/PHACO  09/18/2011   Procedure: CATARACT EXTRACTION PHACO AND INTRAOCULAR LENS PLACEMENT (IOC);  Surgeon: Elta Guadeloupe T. Gershon Crane, MD;  Location: [facility];  Service: Ophthalmology;  Laterality: Right;  CDE: 6.19  . COLONOSCOPY WITH PROPOFOL N/A  07/11/2015   Procedure: COLONOSCOPY WITH PROPOFOL;  Surgeon: Laurence Spates, MD;  Location: [facility];  Service: Endoscopy;  Laterality: N/A;  . CORONARY ARTERY BYPASS GRAFT  1997   "nerve pain at area"  . ESOPHAGOGASTRODUODENOSCOPY (EGD) WITH PROPOFOL N/A 07/11/2015   Procedure: ESOPHAGOGASTRODUODENOSCOPY (EGD) WITH PROPOFOL;  Surgeon: Laurence Spates, MD;  Location: [facility];  Service: Endoscopy;  Laterality: N/A;  . Left CEA  2002   carotid stent-left-   . RENAL ARTERY STENT         Home Medications    Prior to Admission medications   Medication Sig Start Date End Date Taking? Authorizing Provider  acetaminophen (TYLENOL) 500 MG tablet Take 1,000 mg by mouth every 6 (six) hours as needed for headache.    Historical Provider, MD  aspirin 81 MG tablet Take 1 tablet (81 mg total) by mouth daily. 03/12/16   Sueanne Margarita, MD  calcitRIOL (ROCALTROL) 0.5 MCG capsule Take 0.5 mcg by mouth daily. 07/29/14   Historical Provider, MD  cephALEXin (KEFLEX) 500 MG capsule Take 1 capsule (500 mg total) by mouth 4 (four) times daily. Patient not taking: Reported on 06/22/2016 06/08/16   Francine Graven, DO  Cholecalciferol (VITAMIN D) 2000 units CAPS Take 1 capsule by mouth every other day.    Historical Provider, MD  donepezil (ARICEPT) 10 MG tablet Take 1 tablet (10 mg total) by mouth at bedtime. Patient not taking: Reported on 06/22/2016 05/08/16   Marcial Pacas, MD  feeding supplement, ENSURE ENLIVE, (ENSURE ENLIVE) LIQD Take 237 mLs by mouth 2 (two) times daily between meals. 10/16/14   Nishant Dhungel, MD  fenofibrate 160 MG tablet Take 1 tablet (160 mg total) by mouth daily. 02/02/16   Sueanne Margarita, MD  ferrous sulfate 325 (65 FE) MG tablet Take 325 mg by mouth every morning.     Historical Provider, MD  finasteride (PROSCAR) 5 MG tablet Take 5 mg by mouth every morning.     Historical Provider, MD  fluticasone (FLONASE) 50 MCG/ACT nasal spray Place 2 sprays into both nostrils daily as needed  for allergies.     Historical Provider, MD  gabapentin (NEURONTIN) 300 MG capsule Take 1 capsule (300 mg total) by mouth 3 (three) times daily. Patient taking differently: Take 300 mg by mouth every morning.  05/02/15   Dennie Bible, NP  guaiFENesin (MUCUS RELIEF CHEST CONGESTION) 100 MG/5ML liquid Take 200 mg by mouth 3 (three) times daily as needed for cough. Reported on 01/25/2016    Historical Provider, MD  guaiFENesin-dextromethorphan (ROBITUSSIN DM) 100-10 MG/5ML syrup Take 5 mLs by mouth every 4 (four) hours as needed for cough. 10/16/14   Nishant Dhungel, MD  ipratropium (ATROVENT) 0.06 % nasal spray Place 1 spray into both nostrils 4 (four) times daily.    Historical Provider, MD  ipratropium-albuterol (DUONEB) 0.5-2.5 (3) MG/3ML SOLN Take 3 mLs by nebulization every 4 (  four) hours as needed. Patient taking differently: Take 3 mLs by nebulization every 4 (four) hours as needed (for shortness of breath).  02/02/16   Tanda Rockers, MD  lansoprazole (PREVACID) 30 MG capsule Take 30 mg by mouth daily.     Historical Provider, MD  latanoprost (XALATAN) 0.005 % ophthalmic solution Place 1 drop into both eyes at bedtime.  12/28/13   Historical Provider, MD  lubiprostone (AMITIZA) 24 MCG capsule Take 24 mcg by mouth daily as needed for constipation.     Historical Provider, MD  memantine (NAMENDA) 10 MG tablet Take 1 tablet (10 mg total) by mouth 2 (two) times daily. Patient not taking: Reported on 06/22/2016 05/08/16   Marcial Pacas, MD  metoCLOPramide (REGLAN) 10 MG tablet Take 10 mg by mouth 2 (two) times daily. Reported on 09/30/2015 11/01/14   Historical Provider, MD  metoprolol succinate (TOPROL-XL) 25 MG 24 hr tablet Take 1 tablet (25 mg total) by mouth daily. 03/01/16   Sueanne Margarita, MD  mirtazapine (REMERON) 30 MG tablet Take 30 mg by mouth at bedtime.    Historical Provider, MD  Multiple Vitamin (MULTIVITAMIN WITH MINERALS) TABS tablet Take 1 tablet by mouth daily.    Historical Provider, MD    nitroGLYCERIN (NITROSTAT) 0.4 MG SL tablet Place 1 tablet (0.4 mg total) under the tongue every 5 (five) minutes as needed for chest pain. 12/15/15   Sueanne Margarita, MD  ondansetron (ZOFRAN) 4 MG tablet Take 1 tablet (4 mg total) by mouth every 8 (eight) hours as needed for nausea or vomiting. 06/08/16   Francine Graven, DO  OXYGEN Inhale 2 L into the lungs at bedtime. 2lpm with sleep    Historical Provider, MD  polyethylene glycol (MIRALAX / GLYCOLAX) packet Take 17 g by mouth daily as needed for moderate constipation.     Historical Provider, MD  rosuvastatin (CRESTOR) 40 MG tablet Take 1 tablet (40 mg total) by mouth daily. 05/23/16 08/21/16  Sueanne Margarita, MD  Tamsulosin HCl (FLOMAX) 0.4 MG CAPS Take 0.8 mg by mouth at bedtime.     Historical Provider, MD  vitamin B-12 (CYANOCOBALAMIN) 1000 MCG tablet Take 1,000 mcg by mouth daily.    Historical Provider, MD    Family History Family History  Problem Relation Age of Onset  . Hypertension Father   . Hypertension Mother   . High blood pressure    . High Cholesterol    . Anesthesia problems Neg Hx   . Hypotension Neg Hx   . Malignant hyperthermia Neg Hx   . Pseudochol deficiency Neg Hx     Social History Social History  Substance Use Topics  . Smoking status: Former Smoker    Packs/day: 0.50    Years: 40.00    Types: Cigarettes    Quit date: 07/23/1978  . Smokeless tobacco: Never Used  . Alcohol use No     Allergies   Patient has no known allergies.   Review of Systems Review of Systems  Unable to perform ROS: Dementia  Cardiovascular: Positive for chest pain.  Neurological: Positive for headaches.   Physical Exam Updated Vital Signs BP 161/73 (BP Location: Right Arm)   Pulse 66   Temp 97.7 F (36.5 C) (Oral)   Resp 16   Ht 5\' 7"  (1.702 m)   Wt 165 lb (74.8 kg)   SpO2 100%   BMI 25.84 kg/m   Physical Exam CONSTITUTIONAL: Elderly and frail  HEAD: Normocephalic/atraumatic EYES: EOMI/PERRL ENMT: Mucous  membranes  moist NECK: supple no meningeal signs SPINE/BACK:entire spine nontender CV: S1/S2 noted, no murmurs/rubs/gallops noted LUNGS: Lungs are clear to auscultation bilaterally, no apparent distress ABDOMEN: soft, nontender, no rebound or guarding, bowel sounds noted throughout abdomen GU:no cva tenderness NEURO: Pt is awake/alert but confused, moves all extremitiesx4 but decreased bilateral hip flexion noted.  No facial droop.   EXTREMITIES: pulses normal/equal, full ROM, no calf tenderness noted SKIN: warm, color normal PSYCH: flat affect  ED Treatments / Results  DIAGNOSTIC STUDIES: Oxygen Saturation is 100% on RA, normal by my interpretation.    COORDINATION OF CARE: 12:50 AM Discussed treatment plan with pt at bedside which includes lab work and DG Chest and pt agreed to plan.  Labs (all labs ordered are listed, but only abnormal results are displayed) Labs Reviewed  BASIC METABOLIC PANEL - Abnormal; Notable for the following:       Result Value   Glucose, Bld 105 (*)    BUN 25 (*)    Creatinine, Ser 2.67 (*)    GFR calc non Af Amer 21 (*)    GFR calc Af Amer 24 (*)    All other components within normal limits  CBC - Abnormal; Notable for the following:    RBC 3.18 (*)    Hemoglobin 10.5 (*)    HCT 32.0 (*)    MCV 100.6 (*)    All other components within normal limits  TROPONIN I  RAPID URINE DRUG SCREEN, HOSP PERFORMED  URINALYSIS, ROUTINE W REFLEX MICROSCOPIC    EKG  EKG Interpretation  Date/Time:  Friday June 29 2016 00:35:44 EST Ventricular Rate:  68 PR Interval:    QRS Duration: 105 QT Interval:  390 QTC Calculation: 415 R Axis:   37 Text Interpretation:  Sinus rhythm Ventricular premature complex Low voltage, precordial leads Baseline wander in lead(s) V6 Confirmed by Christy Gentles  MD, Anisah Kuck ([id number]) on 06/29/2016 12:41:07 AM       Radiology Dg Chest 2 View  Result Date: 06/29/2016 CLINICAL DATA:  RIGHT chest pain tonight.  History of COPD.  EXAM: CHEST  2 VIEW COMPARISON:  Chest radiograph June 08, 2016 and February 02, 2016 FINDINGS: Cardiac silhouette is normal in size. Status post median sternotomy for CABG. Diffuse ease coarsened pulmonary interstitium with bibasilar scarring/ atelectasis. No pleural effusion focal consolidation. No pneumothorax. Soft tissue planes and included osseous structures are nonsuspicious. High-riding humeral heads most compatible with old rotator cuff injuries. Vascular stent in abdomen. IMPRESSION: Stable chronic interstitial changes consistent with fibrosis/COPD. Electronically Signed   By: Elon Alas M.D.   On: 06/29/2016 01:41   Ct Head Wo Contrast  Result Date: 06/29/2016 CLINICAL DATA:  RIGHT chest pain and altered mental status tonight. History of RIGHT carotid artery stenosis and LEFT carotid endarterectomy. EXAM: CT HEAD WITHOUT CONTRAST TECHNIQUE: Contiguous axial images were obtained from the base of the skull through the vertex without intravenous contrast. COMPARISON:  CT HEAD October 15, 2014. FINDINGS: BRAIN: History of hypertension, hyperlipidemia, dementia. VASCULAR: Moderate calcific atherosclerosis of the carotid siphons. SKULL: No skull fracture. No significant scalp soft tissue swelling. SINUSES/ORBITS: Trace LEFT ethmoid mucosal thickening. Mastoid air cells are well aerated.The included ocular globes and orbital contents are non-suspicious. OTHER: None. IMPRESSION: No acute intracranial process. Stable negative CT HEAD for age. Electronically Signed   By: Elon Alas M.D.   On: 06/29/2016 01:48    Procedures Procedures   Medications Ordered in ED Medications - No data to display   Initial Impression / Assessment  and Plan / ED Course  I have reviewed the triage vital signs and the nursing notes.  Pertinent labs & imaging results that were available during my care of the patient were reviewed by me and considered in my medical decision making (see chart for  details).  Clinical Course    I personally performed the services described in this documentation, which was scribed in my presence. The recorded information has been reviewed and is accurate.      3:36 AM Pt here for 2 issues:  Chest pain and also some confusion Wife gives most of history Apparently due to his dementia, he will have intermittent episodes of confusion.  Tonight's episode is already improving. No new weakness noted.  He can ambulate here tonight without difficulty.  No focal signs of acute stroke at this time  As for chest pain, he mentioned this while at home but now feels improved Initial EKG/troponin unremarkable. CXR c/w chronic changes He has h/o distant CABG but otherwise has been stable from CV standpoint He has no signs of acute PE/Dissection I offered admission for further evaluation of chest pain Pt would like to go home but decision making limited by dementia Wife would also like to take him home.  I advised that ACS can not be fully ruled out with one troponin, but overall he is well appearing/improved and she feels he is best at home.  This seems reasonable He also has PCP f/u later today at 12 noon We discussed strict return precautions   Final Clinical Impressions(s) / ED Diagnoses   Final diagnoses:  Chest pain, unspecified type  Weakness  Chronic renal impairment, unspecified CKD stage    New Prescriptions New Prescriptions   No medications on file     Ripley Fraise, MD 06/29/16 [phone]

## 2016-07-03 DIAGNOSIS — F0390 Unspecified dementia without behavioral disturbance: Secondary | ICD-10-CM | POA: Diagnosis not present

## 2016-07-03 DIAGNOSIS — R1313 Dysphagia, pharyngeal phase: Secondary | ICD-10-CM | POA: Diagnosis not present

## 2016-07-03 DIAGNOSIS — K219 Gastro-esophageal reflux disease without esophagitis: Secondary | ICD-10-CM | POA: Diagnosis not present

## 2016-07-03 DIAGNOSIS — R630 Anorexia: Secondary | ICD-10-CM | POA: Diagnosis not present

## 2016-07-04 DIAGNOSIS — J441 Chronic obstructive pulmonary disease with (acute) exacerbation: Secondary | ICD-10-CM | POA: Diagnosis not present

## 2016-07-04 DIAGNOSIS — I10 Essential (primary) hypertension: Secondary | ICD-10-CM | POA: Diagnosis not present

## 2016-07-04 DIAGNOSIS — R0600 Dyspnea, unspecified: Secondary | ICD-10-CM | POA: Diagnosis not present

## 2016-07-04 DIAGNOSIS — I739 Peripheral vascular disease, unspecified: Secondary | ICD-10-CM | POA: Diagnosis not present

## 2016-07-05 ENCOUNTER — Encounter (HOSPITAL_COMMUNITY): Payer: Commercial Managed Care - HMO | Admitting: Speech Pathology

## 2016-07-05 ENCOUNTER — Telehealth: Payer: Self-pay | Admitting: Cardiology

## 2016-07-05 NOTE — Telephone Encounter (Signed)
New message  Pt's wife is calling to say that the pt has not had the blood work done yet, will be having it done on Monday 12/18  Please call back

## 2016-07-05 NOTE — Telephone Encounter (Signed)
Confirmed with patient's wife he will be fasting for his lab work. She understands to call if she hasn't heard anything about a week after he gets the labs drawn to make sure they were received.  She was grateful for call.

## 2016-07-06 DIAGNOSIS — K5900 Constipation, unspecified: Secondary | ICD-10-CM | POA: Diagnosis not present

## 2016-07-06 DIAGNOSIS — J309 Allergic rhinitis, unspecified: Secondary | ICD-10-CM | POA: Diagnosis not present

## 2016-07-06 DIAGNOSIS — F0390 Unspecified dementia without behavioral disturbance: Secondary | ICD-10-CM | POA: Diagnosis not present

## 2016-07-06 DIAGNOSIS — G629 Polyneuropathy, unspecified: Secondary | ICD-10-CM | POA: Diagnosis not present

## 2016-07-06 DIAGNOSIS — D649 Anemia, unspecified: Secondary | ICD-10-CM | POA: Diagnosis not present

## 2016-07-06 DIAGNOSIS — K219 Gastro-esophageal reflux disease without esophagitis: Secondary | ICD-10-CM | POA: Diagnosis not present

## 2016-07-06 DIAGNOSIS — N183 Chronic kidney disease, stage 3 (moderate): Secondary | ICD-10-CM | POA: Diagnosis not present

## 2016-07-06 DIAGNOSIS — I10 Essential (primary) hypertension: Secondary | ICD-10-CM | POA: Diagnosis not present

## 2016-07-06 DIAGNOSIS — J449 Chronic obstructive pulmonary disease, unspecified: Secondary | ICD-10-CM | POA: Diagnosis not present

## 2016-07-09 ENCOUNTER — Other Ambulatory Visit: Payer: Self-pay | Admitting: Cardiology

## 2016-07-09 DIAGNOSIS — Z79899 Other long term (current) drug therapy: Secondary | ICD-10-CM | POA: Diagnosis not present

## 2016-07-10 LAB — LIPID PANEL
CHOLESTEROL: 145 mg/dL (ref <200)
HDL: 27 mg/dL — AB (ref >40)
LDL CALC: 78 mg/dL (ref <100)
TRIGLYCERIDES: 201 mg/dL — AB (ref <150)
Total CHOL/HDL Ratio: 5.4 Ratio — ABNORMAL HIGH (ref <5.0)
VLDL: 40 mg/dL — ABNORMAL HIGH (ref <30)

## 2016-07-10 LAB — ALT: ALT: 12 U/L (ref 9–46)

## 2016-07-12 ENCOUNTER — Other Ambulatory Visit: Payer: Self-pay

## 2016-07-12 NOTE — Patient Outreach (Signed)
[facility]) Care Management  07/12/2016  [name] [name] Dec 28, 1930 [id number]   Incoming call from wife.  She reports that patient has swallow testing done and recommendation is for thickened liquids and soft foods. She asked about things she could give patient.  Discussed with her food items to give patient and preparation of foods. She verbalized understanding.  Will send education on soft foods to give patient and what not to give.  She reports that she will be starting patient back on medication for his memory as she thinks he has gotten worse since stopping medication. She said the doctor told her that if she wanted to start them again she could. She also mentioned that she is going to start looking for help with patient as sometimes she feels she needs some assistance.  Discussed having social worker contact her about care resources for patient she is agreeable.    Plan: RN Health Coach will complete social work referral to discuss care options and resources.   RN Health Coach will contact patient/caregiver in the month of January and patient/caregiver agrees to next outreach.   [name], RN, MSN [place] [phone]

## 2016-07-15 DIAGNOSIS — J449 Chronic obstructive pulmonary disease, unspecified: Secondary | ICD-10-CM | POA: Diagnosis not present

## 2016-07-15 DIAGNOSIS — J441 Chronic obstructive pulmonary disease with (acute) exacerbation: Secondary | ICD-10-CM | POA: Diagnosis not present

## 2016-07-15 DIAGNOSIS — J9611 Chronic respiratory failure with hypoxia: Secondary | ICD-10-CM | POA: Diagnosis not present

## 2016-07-17 ENCOUNTER — Other Ambulatory Visit: Payer: Self-pay | Admitting: *Deleted

## 2016-07-17 NOTE — Patient Outreach (Signed)
[facility]) Care Management  07/17/2016  [name] [name] [birth year] [id number]   CSW made contact today with patient's wife who reports he has dementia and she is the sole caregiver.  CSW introduced self and role and discussed possible resources that may offer additional support to her and will mail additional information to them to consider.  Per wife, he is not eligible for Medicaid.  CSW will plan f/u call in about 2 weeks to discuss services, information to be mailed and assess further.   Eduard Clos, MSW, [place] Worker  [place] [phone]

## 2016-07-19 NOTE — Patient Outreach (Signed)
[facility]) Care Management  07/19/2016  [name] [name] 07/18/1931 [id number]   Request received from Eduard Clos, LCSW to mail patient caregiver/sitter resources to patient. Information mailed 07/19/16  Josepha Pigg, [place] Management Assistant

## 2016-07-24 ENCOUNTER — Other Ambulatory Visit: Payer: Self-pay

## 2016-07-24 DIAGNOSIS — H40013 Open angle with borderline findings, low risk, bilateral: Secondary | ICD-10-CM | POA: Diagnosis not present

## 2016-07-24 NOTE — Patient Outreach (Signed)
[facility]) Care Management  07/24/2016  [name] [name] Feb 14, 1931 [id number]   Telephone call for monthly call.  No answer.  Unable to leave a message.    Plan: RN Health Coach will attempt patient again in the month of January.    [name], RN, MSN [place] [phone]

## 2016-07-25 DIAGNOSIS — J441 Chronic obstructive pulmonary disease with (acute) exacerbation: Secondary | ICD-10-CM | POA: Diagnosis not present

## 2016-07-26 ENCOUNTER — Telehealth: Payer: Self-pay | Admitting: Cardiology

## 2016-07-26 NOTE — Telephone Encounter (Signed)
Attempted to call back - no answer or VM pick-up after several rings.  Will try again later.

## 2016-07-26 NOTE — Telephone Encounter (Signed)
[name] is requesting that someone give her a call in regards to her husband [name] [name]) blood work results. Please call, thanks.

## 2016-07-30 ENCOUNTER — Other Ambulatory Visit: Payer: Self-pay | Admitting: *Deleted

## 2016-07-30 DIAGNOSIS — N183 Chronic kidney disease, stage 3 (moderate): Secondary | ICD-10-CM | POA: Diagnosis not present

## 2016-07-31 ENCOUNTER — Other Ambulatory Visit: Payer: Self-pay | Admitting: *Deleted

## 2016-07-31 ENCOUNTER — Telehealth: Payer: Self-pay

## 2016-07-31 MED ORDER — EZETIMIBE 10 MG PO TABS: 10.0000 mg | ORAL_TABLET | Freq: Every day | ORAL | 11 refills | Status: DC

## 2016-07-31 NOTE — Telephone Encounter (Signed)
-----   Message from Leeroy Bock, [facility] sent at 07/12/2016  1:42 PM EST ----- Would make sure pt is adherent to Crestor 40mg  daily. If so, would be ok with pt working on diet given his age. If he's willing, could also start Zetia 10mg  daily.

## 2016-07-31 NOTE — Patient Outreach (Signed)
[facility]) Care Management  [facility] Social Work  07/31/2016  TYAUN MIRO 1931-03-31 [id number]  Subjective:  "I take care of him at home"  Objective:  CSW to assist wife with resources in the community to consider to provide additional support/help with patient with dementia.  Current Medications:  Current Outpatient Prescriptions  Medication Sig Dispense Refill  . acetaminophen (TYLENOL) 500 MG tablet Take 1,000 mg by mouth every 6 (six) hours as needed for headache.    Marland Kitchen aspirin 81 MG tablet Take 1 tablet (81 mg total) by mouth daily.    . calcitRIOL (ROCALTROL) 0.5 MCG capsule Take 0.5 mcg by mouth daily.    . cephALEXin (KEFLEX) 500 MG capsule Take 1 capsule (500 mg total) by mouth 4 (four) times daily. (Patient not taking: Reported on 06/22/2016) 40 capsule 0  . Cholecalciferol (VITAMIN D) 2000 units CAPS Take 1 capsule by mouth every other day.    . donepezil (ARICEPT) 10 MG tablet Take 1 tablet (10 mg total) by mouth at bedtime. (Patient not taking: Reported on 06/22/2016) 90 tablet 4  . feeding supplement, ENSURE ENLIVE, (ENSURE ENLIVE) LIQD Take 237 mLs by mouth 2 (two) times daily between meals. 30 Bottle 6  . fenofibrate 160 MG tablet Take 1 tablet (160 mg total) by mouth daily. 30 tablet 11  . ferrous sulfate 325 (65 FE) MG tablet Take 325 mg by mouth every morning.     . finasteride (PROSCAR) 5 MG tablet Take 5 mg by mouth every morning.     . fluticasone (FLONASE) 50 MCG/ACT nasal spray Place 2 sprays into both nostrils daily as needed for allergies.     Marland Kitchen gabapentin (NEURONTIN) 300 MG capsule Take 1 capsule (300 mg total) by mouth 3 (three) times daily. (Patient taking differently: Take 300 mg by mouth every morning. ) 270 capsule 3  . guaiFENesin (MUCUS RELIEF CHEST CONGESTION) 100 MG/5ML liquid Take 200 mg by mouth 3 (three) times daily as needed for cough. Reported on 01/25/2016    . guaiFENesin-dextromethorphan (ROBITUSSIN DM) 100-10 MG/5ML syrup Take 5  mLs by mouth every 4 (four) hours as needed for cough. 118 mL 0  . ipratropium (ATROVENT) 0.06 % nasal spray Place 1 spray into both nostrils 4 (four) times daily.    Marland Kitchen ipratropium-albuterol (DUONEB) 0.5-2.5 (3) MG/3ML SOLN Take 3 mLs by nebulization every 4 (four) hours as needed. (Patient taking differently: Take 3 mLs by nebulization every 4 (four) hours as needed (for shortness of breath). ) 360 mL 11  . lansoprazole (PREVACID) 30 MG capsule Take 30 mg by mouth daily.     Marland Kitchen latanoprost (XALATAN) 0.005 % ophthalmic solution Place 1 drop into both eyes at bedtime.     Marland Kitchen lubiprostone (AMITIZA) 24 MCG capsule Take 24 mcg by mouth daily as needed for constipation.     . memantine (NAMENDA) 10 MG tablet Take 1 tablet (10 mg total) by mouth 2 (two) times daily. (Patient not taking: Reported on 06/22/2016) 180 tablet 4  . metoCLOPramide (REGLAN) 10 MG tablet Take 10 mg by mouth 2 (two) times daily. Reported on 09/30/2015    . metoprolol succinate (TOPROL-XL) 25 MG 24 hr tablet Take 1 tablet (25 mg total) by mouth daily. 90 tablet 0  . mirtazapine (REMERON) 30 MG tablet Take 30 mg by mouth at bedtime.    . Multiple Vitamin (MULTIVITAMIN WITH MINERALS) TABS tablet Take 1 tablet by mouth daily.    . nitroGLYCERIN (NITROSTAT) 0.4 MG SL  tablet Place 1 tablet (0.4 mg total) under the tongue every 5 (five) minutes as needed for chest pain. 25 tablet 11  . ondansetron (ZOFRAN) 4 MG tablet Take 1 tablet (4 mg total) by mouth every 8 (eight) hours as needed for nausea or vomiting. 6 tablet 0  . OXYGEN Inhale 2 L into the lungs at bedtime. 2lpm with sleep    . polyethylene glycol (MIRALAX / GLYCOLAX) packet Take 17 g by mouth daily as needed for moderate constipation.     . rosuvastatin (CRESTOR) 40 MG tablet Take 1 tablet (40 mg total) by mouth daily. 30 tablet 11  . Tamsulosin HCl (FLOMAX) 0.4 MG CAPS Take 0.8 mg by mouth at bedtime.     . vitamin B-12 (CYANOCOBALAMIN) 1000 MCG tablet Take 1,000 mcg by mouth  daily.     No current facility-administered medications for this visit.     Functional Status:  In your present state of health, do you have any difficulty performing the following activities: 02/28/2016 01/26/2016  Hearing? N -  Vision? Y -  Difficulty concentrating or making decisions? Y -  Walking or climbing stairs? Y -  Dressing or bathing? Y -  Doing errands, shopping? Y -  Conservation officer, nature and eating ? Y -  Using the Toilet? N -  In the past six months, have you accidently leaked urine? Y -  Do you have problems with loss of bowel control? Y (No Data)  Managing your Medications? Y -  Managing your Finances? Y -  Housekeeping or managing your Housekeeping? Y -  Some recent data might be hidden    Fall/Depression Screening:  PHQ 2/9 Scores 06/22/2016 05/24/2016 04/23/2016 03/29/2016 02/28/2016 01/13/2016 12/16/2015  PHQ - 2 Score 0 0 0 0 0 - -  Exception Documentation - - - - - Medical reason Medical reason    Assessment:  CSW spoke with patient's wife by phone today. She reports she is the sole caregiver in the home. "I run some errands while he is still asleep'. Discussed possible community based resources including adult day care, facility placement, etc and she is open to reviewing information but does not feel he is interested in a day care program and states, "he's not ready for placement yet".  CSW offered to mail resources to wife to review and consider and will follow up with phone call in about 2 weeks.     [facility] CM Care Plan Problem One   Flowsheet Row Most Recent Value  Care Plan Problem One  Wife is interested in support in home with patient  Role Documenting the Problem One  Clinical Social Worker  Care Plan for Problem One  Active  THN CM Short Term Goal #1 (0-30 days)  Wife will receive, review and consider community resource support wihtin the next 30 days.  THN CM Short Term Goal #1 Start Date  07/31/16  Interventions for Short Term Goal #1  CSW discussed and will  provide resources for adult day care, in home pvt duty care, etc.        Plan:

## 2016-07-31 NOTE — Telephone Encounter (Signed)
Informed patient's DPR of results and verbal understanding expressed.   She agrees to have him START ZETIA 10 mg daily. Prescription for FLP and ALT sent to their home per [facility] request. She understands to have fasting labs drawn 2 months after starting Zetia. She agrees with treatment plan.

## 2016-07-31 NOTE — Telephone Encounter (Signed)
This encounter was created in error - please disregard.

## 2016-07-31 NOTE — Addendum Note (Signed)
Addended by: Harland German A on: 07/31/2016 05:06 PM   Modules accepted: Miquel Dunn

## 2016-08-04 DIAGNOSIS — R0600 Dyspnea, unspecified: Secondary | ICD-10-CM | POA: Diagnosis not present

## 2016-08-04 DIAGNOSIS — I739 Peripheral vascular disease, unspecified: Secondary | ICD-10-CM | POA: Diagnosis not present

## 2016-08-04 DIAGNOSIS — J441 Chronic obstructive pulmonary disease with (acute) exacerbation: Secondary | ICD-10-CM | POA: Diagnosis not present

## 2016-08-04 DIAGNOSIS — I10 Essential (primary) hypertension: Secondary | ICD-10-CM | POA: Diagnosis not present

## 2016-08-06 ENCOUNTER — Other Ambulatory Visit: Payer: Self-pay

## 2016-08-06 DIAGNOSIS — J449 Chronic obstructive pulmonary disease, unspecified: Secondary | ICD-10-CM | POA: Diagnosis not present

## 2016-08-06 NOTE — Patient Outreach (Signed)
[facility]) Care Management  [place]  08/06/2016   [name] [name] 11/20/1930 [id number]  Subjective: Telephone call to wife for monthly call. She reports that patient is doing ok. She reports more memory issues and more incontinence especially at night.  Discussed with wife alzheimer's/dementia and how it is a progressive disease.  She verbalized understanding. She reports that his swallowing has been ok with the thickening of the liquids.  Discussed with wife signs of infection and when to notify physician.  She verbalized understanding.     Objective:   Encounter Medications:  Outpatient Encounter Prescriptions as of 08/06/2016  Medication Sig Note  . acetaminophen (TYLENOL) 500 MG tablet Take 1,000 mg by mouth every 6 (six) hours as needed for headache.   Marland Kitchen aspirin 81 MG tablet Take 1 tablet (81 mg total) by mouth daily.   . calcitRIOL (ROCALTROL) 0.5 MCG capsule Take 0.5 mcg by mouth daily.   . Cholecalciferol (VITAMIN D) 2000 units CAPS Take 1 capsule by mouth every other day.   . ezetimibe (ZETIA) 10 MG tablet Take 1 tablet (10 mg total) by mouth daily.   . feeding supplement, ENSURE ENLIVE, (ENSURE ENLIVE) LIQD Take 237 mLs by mouth 2 (two) times daily between meals.   . fenofibrate 160 MG tablet Take 1 tablet (160 mg total) by mouth daily.   . ferrous sulfate 325 (65 FE) MG tablet Take 325 mg by mouth every morning.    . finasteride (PROSCAR) 5 MG tablet Take 5 mg by mouth every morning.    . fluticasone (FLONASE) 50 MCG/ACT nasal spray Place 2 sprays into both nostrils daily as needed for allergies.    Marland Kitchen gabapentin (NEURONTIN) 300 MG capsule Take 1 capsule (300 mg total) by mouth 3 (three) times daily. (Patient taking differently: Take 300 mg by mouth every morning. )   . guaiFENesin (MUCUS RELIEF CHEST CONGESTION) 100 MG/5ML liquid Take 200 mg by mouth 3 (three) times daily as needed for cough. Reported on 01/25/2016   . guaiFENesin-dextromethorphan  (ROBITUSSIN DM) 100-10 MG/5ML syrup Take 5 mLs by mouth every 4 (four) hours as needed for cough.   Marland Kitchen ipratropium (ATROVENT) 0.06 % nasal spray Place 1 spray into both nostrils 4 (four) times daily.   Marland Kitchen ipratropium-albuterol (DUONEB) 0.5-2.5 (3) MG/3ML SOLN Take 3 mLs by nebulization every 4 (four) hours as needed. (Patient taking differently: Take 3 mLs by nebulization every 4 (four) hours as needed (for shortness of breath). )   . lansoprazole (PREVACID) 30 MG capsule Take 30 mg by mouth daily.    Marland Kitchen latanoprost (XALATAN) 0.005 % ophthalmic solution Place 1 drop into both eyes at bedtime.    Marland Kitchen lubiprostone (AMITIZA) 24 MCG capsule Take 24 mcg by mouth daily as needed for constipation.    . memantine (NAMENDA) 10 MG tablet Take 1 tablet (10 mg total) by mouth 2 (two) times daily.   . metoCLOPramide (REGLAN) 10 MG tablet Take 10 mg by mouth 2 (two) times daily. Reported on 09/30/2015   . metoprolol succinate (TOPROL-XL) 25 MG 24 hr tablet Take 1 tablet (25 mg total) by mouth daily.   . mirtazapine (REMERON) 30 MG tablet Take 30 mg by mouth at bedtime.   . Multiple Vitamin (MULTIVITAMIN WITH MINERALS) TABS tablet Take 1 tablet by mouth daily.   . nitroGLYCERIN (NITROSTAT) 0.4 MG SL tablet Place 1 tablet (0.4 mg total) under the tongue every 5 (five) minutes as needed for chest pain.   Marland Kitchen  ondansetron (ZOFRAN) 4 MG tablet Take 1 tablet (4 mg total) by mouth every 8 (eight) hours as needed for nausea or vomiting.   . OXYGEN Inhale 2 L into the lungs at bedtime. 2lpm with sleep   . polyethylene glycol (MIRALAX / GLYCOLAX) packet Take 17 g by mouth daily as needed for moderate constipation.    . rosuvastatin (CRESTOR) 40 MG tablet Take 1 tablet (40 mg total) by mouth daily.   . Tamsulosin HCl (FLOMAX) 0.4 MG CAPS Take 0.8 mg by mouth at bedtime.    . vitamin B-12 (CYANOCOBALAMIN) 1000 MCG tablet Take 1,000 mcg by mouth daily.   . cephALEXin (KEFLEX) 500 MG capsule Take 1 capsule (500 mg total) by mouth 4  (four) times daily. (Patient not taking: Reported on 08/06/2016)   . donepezil (ARICEPT) 10 MG tablet Take 1 tablet (10 mg total) by mouth at bedtime. (Patient not taking: Reported on 08/06/2016) 06/08/2016: HOLD per MD orders per spouse   No facility-administered encounter medications on file as of 08/06/2016.     Functional Status:  In your present state of health, do you have any difficulty performing the following activities: 02/28/2016 01/26/2016  Hearing? N -  Vision? Y -  Difficulty concentrating or making decisions? Y -  Walking or climbing stairs? Y -  Dressing or bathing? Y -  Doing errands, shopping? Y -  Conservation officer, nature and eating ? Y -  Using the Toilet? N -  In the past six months, have you accidently leaked urine? Y -  Do you have problems with loss of bowel control? Y (No Data)  Managing your Medications? Y -  Managing your Finances? Y -  Housekeeping or managing your Housekeeping? Y -  Some recent data might be hidden    Fall/Depression Screening: PHQ 2/9 Scores 08/06/2016 06/22/2016 05/24/2016 04/23/2016 03/29/2016 02/28/2016 01/13/2016  PHQ - 2 Score 0 0 0 0 0 0 -  Exception Documentation - - - - - - Medical reason    Assessment: Patient continues to benefit from health coach outreach for disease management and support.    Plan:  [facility] CM Care Plan Problem One   Flowsheet Row Most Recent Value  Care Plan Problem One  COPD knowledge deficit  Role Documenting the Problem One  Health Coach  Care Plan for Problem One  Active  THN Long Term Goal (31-90 days)  Caregiver will be able to verbalize action plan for COPD exacerbation in the next 90 days  THN Long Term Goal Start Date  08/06/16 [goal continued]  Interventions for Problem One Long Term Goal  RN Health Coach reviewed with caregiver signs and symptoms of COPD and when to notify physician.      [place] CM Care Plan Problem Two   Flowsheet Row Most Recent Value  Care Plan Problem Two  Impaired Swallowing  Role Documenting  the Problem Two  [place] for Problem Two  Active  Interventions for Problem Two Long Term Goal   RN Health Coach discussed with caregiver chin tuck method when patient is swallowing and not using a straw for drinking.  Also discussed signs and aspiration pneumonia and when to notify physician.    THN Long Term Goal (31-90) days  Caregiver will verbalize utilizing interventions to minimize aspiration within the next 60 days.  THN Long Term Goal Start Date  06/22/16     RN Health Coach will contact patient in the month of February and patient agrees to next  outreach.  [name], RN, MSN [place] [phone]

## 2016-08-07 DIAGNOSIS — I129 Hypertensive chronic kidney disease with stage 1 through stage 4 chronic kidney disease, or unspecified chronic kidney disease: Secondary | ICD-10-CM | POA: Diagnosis not present

## 2016-08-07 DIAGNOSIS — D631 Anemia in chronic kidney disease: Secondary | ICD-10-CM | POA: Diagnosis not present

## 2016-08-07 DIAGNOSIS — N184 Chronic kidney disease, stage 4 (severe): Secondary | ICD-10-CM | POA: Diagnosis not present

## 2016-08-07 DIAGNOSIS — N2581 Secondary hyperparathyroidism of renal origin: Secondary | ICD-10-CM | POA: Diagnosis not present

## 2016-08-10 ENCOUNTER — Other Ambulatory Visit: Payer: Self-pay | Admitting: Nephrology

## 2016-08-10 DIAGNOSIS — I129 Hypertensive chronic kidney disease with stage 1 through stage 4 chronic kidney disease, or unspecified chronic kidney disease: Secondary | ICD-10-CM

## 2016-08-10 DIAGNOSIS — N184 Chronic kidney disease, stage 4 (severe): Principal | ICD-10-CM

## 2016-08-14 ENCOUNTER — Other Ambulatory Visit: Payer: Self-pay | Admitting: *Deleted

## 2016-08-14 NOTE — Patient Outreach (Signed)
[facility]) Care Management  08/14/2016  [name] [name] 01/09/1931 [id number]  Request received from Eduard Clos, LCSW to mail patient community resources and private duty agency information to patient's address and patient's wife, [name].   [name]  [facility] Care Management Assistant

## 2016-08-14 NOTE — Patient Outreach (Signed)
[facility]) Care Management  08/14/2016  KENAZ LYCETT 1931/05/01 [id number]   CSW called and spoke with patient's wife- she reports he has a follow up appointment on Thursday for an ultrasound- had some kidney issues recently as well. "I am trying to fix good meals for him but he is very picky".  CSW discussed community resources as previously mentioned and will ask CMA to mail to her. She is still committed to taking care of him at home and will look over the community resource offerings once mailed.  CSW will plan f/u call for further discussion and interventions.   Eduard Clos, MSW, [place] Worker  [place] [phone]

## 2016-08-15 DIAGNOSIS — J449 Chronic obstructive pulmonary disease, unspecified: Secondary | ICD-10-CM | POA: Diagnosis not present

## 2016-08-15 DIAGNOSIS — J441 Chronic obstructive pulmonary disease with (acute) exacerbation: Secondary | ICD-10-CM | POA: Diagnosis not present

## 2016-08-15 DIAGNOSIS — J9611 Chronic respiratory failure with hypoxia: Secondary | ICD-10-CM | POA: Diagnosis not present

## 2016-08-16 ENCOUNTER — Ambulatory Visit
Admission: RE | Admit: 2016-08-16 | Discharge: 2016-08-16 | Disposition: A | Discharge status: Home / Self Care | Payer: Commercial Managed Care - HMO | Source: Ambulatory Visit | Attending: Nephrology | Admitting: Nephrology

## 2016-08-16 DIAGNOSIS — N184 Chronic kidney disease, stage 4 (severe): Principal | ICD-10-CM

## 2016-08-16 DIAGNOSIS — N189 Chronic kidney disease, unspecified: Secondary | ICD-10-CM | POA: Diagnosis not present

## 2016-08-16 DIAGNOSIS — I129 Hypertensive chronic kidney disease with stage 1 through stage 4 chronic kidney disease, or unspecified chronic kidney disease: Secondary | ICD-10-CM

## 2016-08-16 DIAGNOSIS — R6889 Other general symptoms and signs: Secondary | ICD-10-CM | POA: Diagnosis not present

## 2016-08-27 ENCOUNTER — Other Ambulatory Visit: Payer: Self-pay | Admitting: *Deleted

## 2016-08-28 ENCOUNTER — Other Ambulatory Visit: Payer: Self-pay | Admitting: *Deleted

## 2016-08-28 NOTE — Patient Outreach (Signed)
[facility]) Care Management  [facility] Social Work  08/28/2016  [name] [name] 06-28-1931 [id number]  Subjective: "we are [facility]  Objective:  Patient needing in home assist to provide personal care .  Current Medications:  Current Outpatient Prescriptions  Medication Sig Dispense Refill  . acetaminophen (TYLENOL) 500 MG tablet Take 1,000 mg by mouth every 6 (six) hours as needed for headache.    Marland Kitchen aspirin 81 MG tablet Take 1 tablet (81 mg total) by mouth daily.    . calcitRIOL (ROCALTROL) 0.5 MCG capsule Take 0.5 mcg by mouth daily.    . cephALEXin (KEFLEX) 500 MG capsule Take 1 capsule (500 mg total) by mouth 4 (four) times daily. (Patient not taking: Reported on 08/06/2016) 40 capsule 0  . Cholecalciferol (VITAMIN D) 2000 units CAPS Take 1 capsule by mouth every other day.    . donepezil (ARICEPT) 10 MG tablet Take 1 tablet (10 mg total) by mouth at bedtime. (Patient not taking: Reported on 08/06/2016) 90 tablet 4  . ezetimibe (ZETIA) 10 MG tablet Take 1 tablet (10 mg total) by mouth daily. 30 tablet 11  . feeding supplement, ENSURE ENLIVE, (ENSURE ENLIVE) LIQD Take 237 mLs by mouth 2 (two) times daily between meals. 30 Bottle 6  . fenofibrate 160 MG tablet Take 1 tablet (160 mg total) by mouth daily. 30 tablet 11  . ferrous sulfate 325 (65 FE) MG tablet Take 325 mg by mouth every morning.     . finasteride (PROSCAR) 5 MG tablet Take 5 mg by mouth every morning.     . fluticasone (FLONASE) 50 MCG/ACT nasal spray Place 2 sprays into both nostrils daily as needed for allergies.     Marland Kitchen gabapentin (NEURONTIN) 300 MG capsule Take 1 capsule (300 mg total) by mouth 3 (three) times daily. (Patient taking differently: Take 300 mg by mouth every morning. ) 270 capsule 3  . guaiFENesin (MUCUS RELIEF CHEST CONGESTION) 100 MG/5ML liquid Take 200 mg by mouth 3 (three) times daily as needed for cough. Reported on 01/25/2016    . guaiFENesin-dextromethorphan  (ROBITUSSIN DM) 100-10 MG/5ML syrup Take 5 mLs by mouth every 4 (four) hours as needed for cough. 118 mL 0  . ipratropium (ATROVENT) 0.06 % nasal spray Place 1 spray into both nostrils 4 (four) times daily.    Marland Kitchen ipratropium-albuterol (DUONEB) 0.5-2.5 (3) MG/3ML SOLN Take 3 mLs by nebulization every 4 (four) hours as needed. (Patient taking differently: Take 3 mLs by nebulization every 4 (four) hours as needed (for shortness of breath). ) 360 mL 11  . lansoprazole (PREVACID) 30 MG capsule Take 30 mg by mouth daily.     Marland Kitchen latanoprost (XALATAN) 0.005 % ophthalmic solution Place 1 drop into both eyes at bedtime.     Marland Kitchen lubiprostone (AMITIZA) 24 MCG capsule Take 24 mcg by mouth daily as needed for constipation.     . memantine (NAMENDA) 10 MG tablet Take 1 tablet (10 mg total) by mouth 2 (two) times daily. 180 tablet 4  . metoCLOPramide (REGLAN) 10 MG tablet Take 10 mg by mouth 2 (two) times daily. Reported on 09/30/2015    . metoprolol succinate (TOPROL-XL) 25 MG 24 hr tablet Take 1 tablet (25 mg total) by mouth daily. 90 tablet 0  . mirtazapine (REMERON) 30 MG tablet Take 30 mg by mouth at bedtime.    . Multiple Vitamin (MULTIVITAMIN WITH MINERALS) TABS tablet Take 1 tablet by mouth daily.    Marland Kitchen  nitroGLYCERIN (NITROSTAT) 0.4 MG SL tablet Place 1 tablet (0.4 mg total) under the tongue every 5 (five) minutes as needed for chest pain. 25 tablet 11  . ondansetron (ZOFRAN) 4 MG tablet Take 1 tablet (4 mg total) by mouth every 8 (eight) hours as needed for nausea or vomiting. 6 tablet 0  . OXYGEN Inhale 2 L into the lungs at bedtime. 2lpm with sleep    . polyethylene glycol (MIRALAX / GLYCOLAX) packet Take 17 g by mouth daily as needed for moderate constipation.     . rosuvastatin (CRESTOR) 40 MG tablet Take 1 tablet (40 mg total) by mouth daily. 30 tablet 11  . Tamsulosin HCl (FLOMAX) 0.4 MG CAPS Take 0.8 mg by mouth at bedtime.     . vitamin B-12 (CYANOCOBALAMIN) 1000 MCG tablet Take 1,000 mcg by mouth daily.      No current facility-administered medications for this visit.     Functional Status:  In your present state of health, do you have any difficulty performing the following activities: 02/28/2016 01/26/2016  Hearing? N -  Vision? Y -  Difficulty concentrating or making decisions? Y -  Walking or climbing stairs? Y -  Dressing or bathing? Y -  Doing errands, shopping? Y -  Conservation officer, nature and eating ? Y -  Using the Toilet? N -  In the past six months, have you accidently leaked urine? Y -  Do you have problems with loss of bowel control? Y (No Data)  Managing your Medications? Y -  Managing your Finances? Y -  Housekeeping or managing your Housekeeping? Y -  Some recent data might be hidden    Fall/Depression Screening:  PHQ 2/9 Scores 08/06/2016 06/22/2016 05/24/2016 04/23/2016 03/29/2016 02/28/2016 01/13/2016  PHQ - 2 Score 0 0 0 0 0 0 -  Exception Documentation - - - - - - Medical reason    Assessment: CSW spoke with patients wife by phone on 08/27/16. She reports they have connected with the [place] who will be providing some in home care to assist with his needs. Patient's wife pleased with this support and feels this will help them to keep him  Home which is her preference.   Plan:  CSW discussed CSW case closure with wife. She voiced appreciation for [facility] and the assistance and resources provided.  CSW will advise [facility] team and PCP of plans to close case as CSW  goals met.     [facility] CM Care Plan Problem One   Flowsheet Row Most Recent Value  Care Plan Problem One  Wife is interested in support in home with patient  Role Documenting the Problem One  Clinical Social Worker  Care Plan for Problem One  Active  THN CM Short Term Goal #1 (0-30 days)  Wife will receive, review and consider community resource support wihtin the next 30 days.  THN CM Short Term Goal #1 Start Date  07/31/16  [facility] CM Short Term Goal #1 Met Date  08/27/16  Interventions for Short Term Goal #1  CSW discussed and will  provide resources for adult day care, in home pvt duty care, etc.       Eduard Clos, MSW, [place] Worker  [place] [phone]

## 2016-08-30 ENCOUNTER — Other Ambulatory Visit: Payer: Self-pay

## 2016-08-30 NOTE — Patient Outreach (Signed)
[facility]) Care Management  [name] [name]  08/30/2016   [name] [name] January 24, 1931 [id number]  Subjective: Telephone call to wife for monthly call.  She reports that they went to the [facility] and that they looked into getting help so that wife can be freed up some.  She says that patient qualifies for help and she is waiting for response from the [employer].  She states that patient has a UTI when he went to the [employer] and is on antibiotics.  Discussed with wife signs of UTI and worsening.  She verbalized understanding.  Discussed with wife COPD and swallowing and when to notify physician.  She verbalized understanding.    Objective:   Encounter Medications:  Outpatient Encounter Prescriptions as of 08/30/2016  Medication Sig Note  . acetaminophen (TYLENOL) 500 MG tablet Take 1,000 mg by mouth every 6 (six) hours as needed for headache.   Marland Kitchen amoxicillin (AMOXIL) 500 MG tablet Take 500 mg by mouth 3 (three) times daily.   Marland Kitchen aspirin 81 MG tablet Take 1 tablet (81 mg total) by mouth daily.   . calcitRIOL (ROCALTROL) 0.5 MCG capsule Take 0.5 mcg by mouth daily.   . Cholecalciferol (VITAMIN D) 2000 units CAPS Take 1 capsule by mouth every other day.   . ezetimibe (ZETIA) 10 MG tablet Take 1 tablet (10 mg total) by mouth daily.   . feeding supplement, ENSURE ENLIVE, (ENSURE ENLIVE) LIQD Take 237 mLs by mouth 2 (two) times daily between meals.   . fenofibrate 160 MG tablet Take 1 tablet (160 mg total) by mouth daily.   . ferrous sulfate 325 (65 FE) MG tablet Take 325 mg by mouth every morning.    . finasteride (PROSCAR) 5 MG tablet Take 5 mg by mouth every morning.    . fluticasone (FLONASE) 50 MCG/ACT nasal spray Place 2 sprays into both nostrils daily as needed for allergies.    Marland Kitchen gabapentin (NEURONTIN) 300 MG capsule Take 1 capsule (300 mg total) by mouth 3 (three) times daily. (Patient taking differently: Take 300 mg by mouth every  morning. )   . guaiFENesin (MUCUS RELIEF CHEST CONGESTION) 100 MG/5ML liquid Take 200 mg by mouth 3 (three) times daily as needed for cough. Reported on 01/25/2016   . guaiFENesin-dextromethorphan (ROBITUSSIN DM) 100-10 MG/5ML syrup Take 5 mLs by mouth every 4 (four) hours as needed for cough.   Marland Kitchen ipratropium (ATROVENT) 0.06 % nasal spray Place 1 spray into both nostrils 4 (four) times daily.   Marland Kitchen ipratropium-albuterol (DUONEB) 0.5-2.5 (3) MG/3ML SOLN Take 3 mLs by nebulization every 4 (four) hours as needed. (Patient taking differently: Take 3 mLs by nebulization every 4 (four) hours as needed (for shortness of breath). )   . lansoprazole (PREVACID) 30 MG capsule Take 30 mg by mouth daily.    Marland Kitchen latanoprost (XALATAN) 0.005 % ophthalmic solution Place 1 drop into both eyes at bedtime.    Marland Kitchen lubiprostone (AMITIZA) 24 MCG capsule Take 24 mcg by mouth daily as needed for constipation.    . memantine (NAMENDA) 10 MG tablet Take 1 tablet (10 mg total) by mouth 2 (two) times daily.   . metoCLOPramide (REGLAN) 10 MG tablet Take 10 mg by mouth 2 (two) times daily. Reported on 09/30/2015   . metoprolol succinate (TOPROL-XL) 25 MG 24 hr tablet Take 1 tablet (25 mg total) by mouth daily.   . mirtazapine (REMERON) 30 MG tablet Take 30 mg by mouth at bedtime.   Marland Kitchen  Multiple Vitamin (MULTIVITAMIN WITH MINERALS) TABS tablet Take 1 tablet by mouth daily.   . nitroGLYCERIN (NITROSTAT) 0.4 MG SL tablet Place 1 tablet (0.4 mg total) under the tongue every 5 (five) minutes as needed for chest pain.   Marland Kitchen ondansetron (ZOFRAN) 4 MG tablet Take 1 tablet (4 mg total) by mouth every 8 (eight) hours as needed for nausea or vomiting.   . OXYGEN Inhale 2 L into the lungs at bedtime. 2lpm with sleep   . polyethylene glycol (MIRALAX / GLYCOLAX) packet Take 17 g by mouth daily as needed for moderate constipation.    . Tamsulosin HCl (FLOMAX) 0.4 MG CAPS Take 0.8 mg by mouth at bedtime.    . cephALEXin (KEFLEX) 500 MG capsule Take 1  capsule (500 mg total) by mouth 4 (four) times daily. (Patient not taking: Reported on 08/06/2016)   . donepezil (ARICEPT) 10 MG tablet Take 1 tablet (10 mg total) by mouth at bedtime. (Patient not taking: Reported on 08/06/2016) 06/08/2016: HOLD per MD orders per spouse  . rosuvastatin (CRESTOR) 40 MG tablet Take 1 tablet (40 mg total) by mouth daily.   . vitamin B-12 (CYANOCOBALAMIN) 1000 MCG tablet Take 1,000 mcg by mouth daily.    No facility-administered encounter medications on file as of 08/30/2016.     Functional Status:  In your present state of health, do you have any difficulty performing the following activities: 02/28/2016 01/26/2016  Hearing? N -  Vision? Y -  Difficulty concentrating or making decisions? Y -  Walking or climbing stairs? Y -  Dressing or bathing? Y -  Doing errands, shopping? Y -  Conservation officer, nature and eating ? Y -  Using the Toilet? N -  In the past six months, have you accidently leaked urine? Y -  Do you have problems with loss of bowel control? Y (No Data)  Managing your Medications? Y -  Managing your Finances? Y -  Housekeeping or managing your Housekeeping? Y -  Some recent data might be hidden    Fall/Depression Screening: PHQ 2/9 Scores 08/30/2016 08/06/2016 06/22/2016 05/24/2016 04/23/2016 03/29/2016 02/28/2016  PHQ - 2 Score 0 0 0 0 0 0 0  Exception Documentation - - - - - - -    Assessment: Patient continues to benefit from health coach outreach for disease management and support.    Plan:  [facility] CM Care Plan Problem One   Flowsheet Row Most Recent Value  Care Plan Problem One  COPD knowledge deficit  Role Documenting the Problem One  Health Coach  Care Plan for Problem One  Active  THN Long Term Goal (31-90 days)  Caregiver will be able to verbalize action plan for COPD exacerbation in the next 90 days  THN Long Term Goal Start Date  08/30/16 [goal continued]  Interventions for Problem One Long Term Goal  RN Health Coach reiterated with caregiver signs  and symptoms of COPD and when to notify physician.      [facility] CM Care Plan Problem Two   Flowsheet Row Most Recent Value  Care Plan Problem Two  Impaired Swallowing  Role Documenting the Problem Two  [place] for Problem Two  Active  Interventions for Problem Two Long Term Goal   RN Health Coach reiterated with caregiver chin tuck method when patient is swallowing and not using a straw for drinking.  Also reviewed signs and aspiration pneumonia and when to notify physician.    THN Long Term Goal (31-90) days  Caregiver will verbalize utilizing interventions to minimize aspiration within the next 60 days.  THN Long Term Goal Start Date  08/30/16 [facility] continued]     [place] will contact patient in the month of March and patient agrees to next outreach.  [name], RN, MSN [place] [phone]

## 2016-09-04 DIAGNOSIS — I739 Peripheral vascular disease, unspecified: Secondary | ICD-10-CM | POA: Diagnosis not present

## 2016-09-04 DIAGNOSIS — I10 Essential (primary) hypertension: Secondary | ICD-10-CM | POA: Diagnosis not present

## 2016-09-04 DIAGNOSIS — J441 Chronic obstructive pulmonary disease with (acute) exacerbation: Secondary | ICD-10-CM | POA: Diagnosis not present

## 2016-09-04 DIAGNOSIS — R0600 Dyspnea, unspecified: Secondary | ICD-10-CM | POA: Diagnosis not present

## 2016-09-11 ENCOUNTER — Other Ambulatory Visit: Payer: Self-pay | Admitting: Cardiology

## 2016-09-15 DIAGNOSIS — J449 Chronic obstructive pulmonary disease, unspecified: Secondary | ICD-10-CM | POA: Diagnosis not present

## 2016-09-15 DIAGNOSIS — J441 Chronic obstructive pulmonary disease with (acute) exacerbation: Secondary | ICD-10-CM | POA: Diagnosis not present

## 2016-09-15 DIAGNOSIS — J9611 Chronic respiratory failure with hypoxia: Secondary | ICD-10-CM | POA: Diagnosis not present

## 2016-09-26 DIAGNOSIS — E785 Hyperlipidemia, unspecified: Secondary | ICD-10-CM | POA: Diagnosis not present

## 2016-09-27 ENCOUNTER — Other Ambulatory Visit: Payer: Self-pay

## 2016-09-27 NOTE — Patient Outreach (Signed)
[facility]) Care Management  [place]  09/27/2016   [name] [name] Feb 20, 1931 [id number]  Subjective: Telephone call to spouse/caregiver she reports that patient is doing ok.  She reports that patient still dealing with UTI and will see primary care physician on tomorrow.  Wife reports that patient memory is continuing to decline.  She says he has problems processing tasks.  Discussed with wife alzheimer's disease and how disease is a continual decline. She verbalized understanding. Wife reports that patient's breathing is doing ok.  Reviewed with wife COPD action plan.  She verbalized understanding.    Objective:   Encounter Medications:  Outpatient Encounter Prescriptions as of 09/27/2016  Medication Sig Note  . acetaminophen (TYLENOL) 500 MG tablet Take 1,000 mg by mouth every 6 (six) hours as needed for headache.   Marland Kitchen aspirin 81 MG tablet Take 1 tablet (81 mg total) by mouth daily.   . calcitRIOL (ROCALTROL) 0.5 MCG capsule Take 0.5 mcg by mouth daily.   . Cholecalciferol (VITAMIN D) 2000 units CAPS Take 1 capsule by mouth every other day.   . ezetimibe (ZETIA) 10 MG tablet Take 1 tablet (10 mg total) by mouth daily.   . feeding supplement, ENSURE ENLIVE, (ENSURE ENLIVE) LIQD Take 237 mLs by mouth 2 (two) times daily between meals.   . fenofibrate 160 MG tablet Take 1 tablet (160 mg total) by mouth daily.   . ferrous sulfate 325 (65 FE) MG tablet Take 325 mg by mouth every morning.    . finasteride (PROSCAR) 5 MG tablet Take 5 mg by mouth every morning.    . fluticasone (FLONASE) 50 MCG/ACT nasal spray Place 2 sprays into both nostrils daily as needed for allergies.    Marland Kitchen gabapentin (NEURONTIN) 300 MG capsule Take 1 capsule (300 mg total) by mouth 3 (three) times daily. (Patient taking differently: Take 300 mg by mouth every morning. )   . guaiFENesin (MUCUS RELIEF CHEST CONGESTION) 100 MG/5ML liquid Take 200 mg by mouth 3 (three) times daily as needed for cough.  Reported on 01/25/2016   . guaiFENesin-dextromethorphan (ROBITUSSIN DM) 100-10 MG/5ML syrup Take 5 mLs by mouth every 4 (four) hours as needed for cough.   Marland Kitchen ipratropium (ATROVENT) 0.06 % nasal spray Place 1 spray into both nostrils 4 (four) times daily.   Marland Kitchen ipratropium-albuterol (DUONEB) 0.5-2.5 (3) MG/3ML SOLN Take 3 mLs by nebulization every 4 (four) hours as needed. (Patient taking differently: Take 3 mLs by nebulization every 4 (four) hours as needed (for shortness of breath). )   . lansoprazole (PREVACID) 30 MG capsule Take 30 mg by mouth daily.    Marland Kitchen latanoprost (XALATAN) 0.005 % ophthalmic solution Place 1 drop into both eyes at bedtime.    Marland Kitchen lubiprostone (AMITIZA) 24 MCG capsule Take 24 mcg by mouth daily as needed for constipation.    . metoCLOPramide (REGLAN) 10 MG tablet Take 10 mg by mouth 2 (two) times daily. Reported on 09/30/2015   . metoprolol succinate (TOPROL-XL) 25 MG 24 hr tablet Take 1 tablet (25 mg total) by mouth daily.   . mirtazapine (REMERON) 30 MG tablet Take 30 mg by mouth at bedtime.   . Multiple Vitamin (MULTIVITAMIN WITH MINERALS) TABS tablet Take 1 tablet by mouth daily.   . nitroGLYCERIN (NITROSTAT) 0.4 MG SL tablet Place 1 tablet (0.4 mg total) under the tongue every 5 (five) minutes as needed for chest pain.   Marland Kitchen ondansetron (ZOFRAN) 4 MG tablet Take 1 tablet (4 mg total)  by mouth every 8 (eight) hours as needed for nausea or vomiting.   . OXYGEN Inhale 2 L into the lungs at bedtime. 2lpm with sleep   . polyethylene glycol (MIRALAX / GLYCOLAX) packet Take 17 g by mouth daily as needed for moderate constipation.    . Tamsulosin HCl (FLOMAX) 0.4 MG CAPS Take 0.8 mg by mouth at bedtime.    . vitamin B-12 (CYANOCOBALAMIN) 1000 MCG tablet Take 1,000 mcg by mouth daily.   Marland Kitchen amoxicillin (AMOXIL) 500 MG tablet Take 500 mg by mouth 3 (three) times daily.   . cephALEXin (KEFLEX) 500 MG capsule Take 1 capsule (500 mg total) by mouth 4 (four) times daily. (Patient not taking:  Reported on 08/06/2016)   . donepezil (ARICEPT) 10 MG tablet Take 1 tablet (10 mg total) by mouth at bedtime. (Patient not taking: Reported on 08/06/2016) 06/08/2016: HOLD per MD orders per spouse  . memantine (NAMENDA) 10 MG tablet Take 1 tablet (10 mg total) by mouth 2 (two) times daily. (Patient not taking: Reported on 08/30/2016)   . rosuvastatin (CRESTOR) 40 MG tablet Take 1 tablet (40 mg total) by mouth daily. 09/27/2016: Patient Taking   No facility-administered encounter medications on file as of 09/27/2016.     Functional Status:  In your present state of health, do you have any difficulty performing the following activities: 02/28/2016 01/26/2016  Hearing? N -  Vision? Y -  Difficulty concentrating or making decisions? Y -  Walking or climbing stairs? Y -  Dressing or bathing? Y -  Doing errands, shopping? Y -  Conservation officer, nature and eating ? Y -  Using the Toilet? N -  In the past six months, have you accidently leaked urine? Y -  Do you have problems with loss of bowel control? Y (No Data)  Managing your Medications? Y -  Managing your Finances? Y -  Housekeeping or managing your Housekeeping? Y -  Some recent data might be hidden    Fall/Depression Screening: PHQ 2/9 Scores 08/30/2016 08/06/2016 06/22/2016 05/24/2016 04/23/2016 03/29/2016 02/28/2016  PHQ - 2 Score 0 0 0 0 0 0 0  Exception Documentation - - - - - - -    Assessment: Patient continues to benefit from health coach outreach for disease management and support.    Plan:  [facility] CM Care Plan Problem One   Flowsheet Row Most Recent Value  Care Plan Problem One  COPD knowledge deficit  Role Documenting the Problem One  Health Coach  Care Plan for Problem One  Active  THN Long Term Goal (31-90 days)  Caregiver will be able to verbalize action plan for COPD exacerbation in the next 90 days  THN Long Term Goal Start Date  09/27/16 [goal continued]  Interventions for Problem One Long Term Goal  RN Health Coach reviewed with caregiver  signs and symptoms of COPD and when to notify physician.      [facility] CM Care Plan Problem Two   Flowsheet Row Most Recent Value  Care Plan Problem Two  Impaired Swallowing  Role Documenting the Problem Two  [place] for Problem Two  Active  Interventions for Problem Two Long Term Goal   RN Health Coach reviewed with caregiver chin tuck method when patient is swallowing and not using a straw for drinking.  Also reviewed signs and aspiration pneumonia and when to notify physician.    THN Long Term Goal (31-90) days  Caregiver will verbalize utilizing interventions to minimize aspiration within  the next 60 days.  THN Long Term Goal Start Date  09/27/16 [goal continued]    [facility] CM Care Plan Problem Three   Flowsheet Row Most Recent Value  Care Plan Problem Three  Alzheimer's Disease  Role Documenting the Problem [place] for Problem Three  Active  THN Long Term Goal (31-90) days  Caregiver will verbalize understanding of alzheimer disease process and care within 90 days.    THN Long Term Goal Start Date  09/27/16  Interventions for Problem Three Long Term Goal  RN Health Coach discussed with caregiver wife alzheimer's disease and symptoms of decline.        RN Health Coach will contact patient in the month of April and patient agrees to next outreach.  [name], RN, MSN [place] [phone]

## 2016-09-28 DIAGNOSIS — I10 Essential (primary) hypertension: Secondary | ICD-10-CM | POA: Diagnosis not present

## 2016-09-28 DIAGNOSIS — R7303 Prediabetes: Secondary | ICD-10-CM | POA: Diagnosis not present

## 2016-09-28 DIAGNOSIS — G629 Polyneuropathy, unspecified: Secondary | ICD-10-CM | POA: Diagnosis not present

## 2016-09-28 DIAGNOSIS — N184 Chronic kidney disease, stage 4 (severe): Secondary | ICD-10-CM | POA: Diagnosis not present

## 2016-09-28 DIAGNOSIS — J309 Allergic rhinitis, unspecified: Secondary | ICD-10-CM | POA: Diagnosis not present

## 2016-09-28 DIAGNOSIS — R300 Dysuria: Secondary | ICD-10-CM | POA: Diagnosis not present

## 2016-09-28 DIAGNOSIS — J449 Chronic obstructive pulmonary disease, unspecified: Secondary | ICD-10-CM | POA: Diagnosis not present

## 2016-09-28 DIAGNOSIS — K219 Gastro-esophageal reflux disease without esophagitis: Secondary | ICD-10-CM | POA: Diagnosis not present

## 2016-09-28 DIAGNOSIS — F0390 Unspecified dementia without behavioral disturbance: Secondary | ICD-10-CM | POA: Diagnosis not present

## 2016-09-28 DIAGNOSIS — D649 Anemia, unspecified: Secondary | ICD-10-CM | POA: Diagnosis not present

## 2016-10-02 DIAGNOSIS — R0600 Dyspnea, unspecified: Secondary | ICD-10-CM | POA: Diagnosis not present

## 2016-10-02 DIAGNOSIS — I739 Peripheral vascular disease, unspecified: Secondary | ICD-10-CM | POA: Diagnosis not present

## 2016-10-02 DIAGNOSIS — J441 Chronic obstructive pulmonary disease with (acute) exacerbation: Secondary | ICD-10-CM | POA: Diagnosis not present

## 2016-10-02 DIAGNOSIS — I10 Essential (primary) hypertension: Secondary | ICD-10-CM | POA: Diagnosis not present

## 2016-10-03 ENCOUNTER — Encounter: Payer: Self-pay | Admitting: Cardiology

## 2016-10-04 ENCOUNTER — Telehealth: Payer: Self-pay | Admitting: Neurology

## 2016-10-04 NOTE — Telephone Encounter (Addendum)
Per Dr. Krista Blue - schedule patient a follow up for a current exam.  He may be eligible for our research trial.  His wife says he is more confused - gives the example of taking dirty dishes to the bedroom instead of the kitchen.  He sometime gets agitated when he cannot remember something quickly. His appt has been scheduled.

## 2016-10-04 NOTE — Telephone Encounter (Signed)
Patients wife Peter [place] called office in reference to dementia Namenda and Aricept.  Patients PCP took patient off the 2 medications due to acid reflux.  Patients wife is wanting to see if he can be prescribed something else.  Please call.

## 2016-10-11 ENCOUNTER — Encounter: Payer: Self-pay | Admitting: Cardiology

## 2016-10-11 ENCOUNTER — Encounter (INDEPENDENT_AMBULATORY_CARE_PROVIDER_SITE_OTHER): Payer: Self-pay

## 2016-10-11 ENCOUNTER — Ambulatory Visit (INDEPENDENT_AMBULATORY_CARE_PROVIDER_SITE_OTHER): Payer: Medicare HMO | Admitting: Cardiology

## 2016-10-11 VITALS — BP 118/64 | HR 71 | Ht 67.0 in | Wt 165.1 lb

## 2016-10-11 DIAGNOSIS — E785 Hyperlipidemia, unspecified: Secondary | ICD-10-CM | POA: Diagnosis not present

## 2016-10-11 DIAGNOSIS — I10 Essential (primary) hypertension: Secondary | ICD-10-CM

## 2016-10-11 DIAGNOSIS — R6889 Other general symptoms and signs: Secondary | ICD-10-CM | POA: Diagnosis not present

## 2016-10-11 DIAGNOSIS — I25118 Atherosclerotic heart disease of native coronary artery with other forms of angina pectoris: Secondary | ICD-10-CM | POA: Diagnosis not present

## 2016-10-11 DIAGNOSIS — I6523 Occlusion and stenosis of bilateral carotid arteries: Secondary | ICD-10-CM | POA: Diagnosis not present

## 2016-10-11 DIAGNOSIS — I739 Peripheral vascular disease, unspecified: Secondary | ICD-10-CM | POA: Diagnosis not present

## 2016-10-11 NOTE — Progress Notes (Deleted)
  This encounter was created in error - please disregard.    ROS     

## 2016-10-11 NOTE — Patient Instructions (Addendum)
Medication Instructions:  Your physician recommends that you continue on your current medications as directed. Please refer to the Current Medication list given to you today.   Labwork: None  Testing/Procedures: None  Follow-Up: Your physician recommends that you schedule a follow-up appointment with [facility].  You have been referred to DR. EARLY.  Your physician wants you to follow-up in: 6 months with Dr. Theodosia Blender assistant. You will receive a reminder letter in the mail two months in advance. If you don't receive a letter, please call our office to schedule the follow-up appointment.   Your physician wants you to follow-up in: 1 year with Dr. Radford Pax. You will receive a reminder letter in the mail two months in advance. If you don't receive a letter, please call our office to schedule the follow-up appointment.   Any Other Special Instructions Will Be Listed Below (If Applicable).     If you need a refill on your cardiac medications before your next appointment, please call your pharmacy.

## 2016-10-11 NOTE — Progress Notes (Signed)
Cardiology Office Note    Date:  10/11/2016   ID:  [name] [name], DOB [birth year], MRN [id number]  PCP:  Gara Kroner, MD  Cardiologist:  Fransico Him, MD   Chief Complaint  Patient presents with  . Coronary Artery Disease  . Hypertension  . Hyperlipidemia    History of Present Illness:  [name] [name] is a 83 y.o. male who presents for followup of his ASCAD. He has a history of ASCAD s/p CABG in 1997 with known atretic LIMA to LAD and 40% ostial LM, PVD s/p left iliac stent and renal artery stenosis s/p left renal artery stent, carotid artery stenosis s/p left CEA, dementia, HTN, CKD, noncardiac chest wall pain, dyslipidemia and COPD. Nuclear stress test in 2013 showed no ischemia.He has moderate pulmonary HTN from COPD. He has chronic chest wall pain that never goes away. He denies any anginal chest pain but has been having problems with swallowing and has been getting food stuck in his throat.  He is seeing Dr. Oletta Lamas tomorrow.  He has chronic DOE from his COPD which is very stable.  He uses O2 at night but not during the day.He has not had any further LE edema. He denies any palpitations, dizziness or syncope. He has not been eating well because of difficulty swallowing.  He has been constipated due to iron he is taking for anemia.  He has lost 5lb since the summer.     Past Medical History:  Diagnosis Date  . Alzheimer disease   . Anemia   . BPH (benign prostatic hyperplasia)   . CAD (coronary artery disease)    s/p CABG in 1997 with known atretic LIMA to LAD and 40% LM.    Marland Kitchen Carotid artery stenosis    s/p left CEA.> 20 yrs ago and > 70% right ICA stenosis - followed by Dr. Donnetta Hutching  . Chronic chest wall pain   . Chronic kidney disease (CKD), stage III (moderate)   . COPD (chronic obstructive pulmonary disease) ([place])   . Cough    cough with productive white to yellow sputum.  . DEMENTIA    "short term memory issues"  . Depression with anxiety   . GERD  (gastroesophageal reflux disease)   . Glaucoma    bilateral  . Hyperlipemia   . Hypertension   . Memory loss   . Monoclonal gammopathy   . Neuromuscular disorder (HCC)    fingertips numbness"  . On home oxygen therapy    at bedtime-2 L/m nasally..    . Peptic ulcer disease   . PVD (peripheral vascular disease) ([place])    s/p left renal artery stent and left iliac stent  . Retinal artery occlusion     Past Surgical History:  Procedure Laterality Date  . BACK SURGERY  2011   lower back -disc  . CARDIAC SURGERY    . CATARACT EXTRACTION W/PHACO  09/18/2011   Procedure: CATARACT EXTRACTION PHACO AND INTRAOCULAR LENS PLACEMENT (IOC);  Surgeon: Elta Guadeloupe T. Gershon Crane, MD;  Location: [facility];  Service: Ophthalmology;  Laterality: Right;  CDE: 6.19  . COLONOSCOPY WITH PROPOFOL N/A 07/11/2015   Procedure: COLONOSCOPY WITH PROPOFOL;  Surgeon: Laurence Spates, MD;  Location: [facility];  Service: Endoscopy;  Laterality: N/A;  . CORONARY ARTERY BYPASS GRAFT  1997   "nerve pain at area"  . ESOPHAGOGASTRODUODENOSCOPY (EGD) WITH PROPOFOL N/A 07/11/2015   Procedure: ESOPHAGOGASTRODUODENOSCOPY (EGD) WITH PROPOFOL;  Surgeon: Laurence Spates, MD;  [facility];  Service: Endoscopy;  Laterality:  N/A;  . Left CEA  2002   carotid stent-left-   . RENAL ARTERY STENT      Current Medications: Current Meds  Medication Sig  . acetaminophen (TYLENOL) 500 MG tablet Take 1,000 mg by mouth every 6 (six) hours as needed for headache.  Marland Kitchen aspirin 81 MG tablet Take 1 tablet (81 mg total) by mouth daily.  . calcitRIOL (ROCALTROL) 0.5 MCG capsule Take 0.5 mcg by mouth daily.  . Cholecalciferol (VITAMIN D) 2000 units CAPS Take 1 capsule by mouth every other day.  . feeding supplement, ENSURE ENLIVE, (ENSURE ENLIVE) LIQD Take 237 mLs by mouth 2 (two) times daily between meals.  . fenofibrate 160 MG tablet Take 1 tablet (160 mg total) by mouth daily.  . ferrous sulfate 325 (65 FE) MG tablet Take 325 mg by mouth  every morning.   . finasteride (PROSCAR) 5 MG tablet Take 5 mg by mouth every morning.   . fluticasone (FLONASE) 50 MCG/ACT nasal spray Place 2 sprays into both nostrils daily as needed for allergies.   Marland Kitchen gabapentin (NEURONTIN) 300 MG capsule Take 1 capsule (300 mg total) by mouth 3 (three) times daily. (Patient taking differently: Take 300 mg by mouth every morning. )  . ipratropium (ATROVENT) 0.06 % nasal spray Place 1 spray into both nostrils 4 (four) times daily.  Marland Kitchen ipratropium-albuterol (DUONEB) 0.5-2.5 (3) MG/3ML SOLN Take 3 mLs by nebulization every 4 (four) hours as needed. (Patient taking differently: Take 3 mLs by nebulization every 4 (four) hours as needed (for shortness of breath). )  . lansoprazole (PREVACID) 30 MG capsule Take 30 mg by mouth daily.   Marland Kitchen latanoprost (XALATAN) 0.005 % ophthalmic solution Place 1 drop into both eyes at bedtime.   Marland Kitchen lubiprostone (AMITIZA) 24 MCG capsule Take 24 mcg by mouth daily as needed for constipation.   . metoCLOPramide (REGLAN) 10 MG tablet Take 10 mg by mouth 2 (two) times daily. Reported on 09/30/2015  . metoprolol succinate (TOPROL-XL) 25 MG 24 hr tablet Take 1 tablet (25 mg total) by mouth daily.  . mirtazapine (REMERON) 30 MG tablet Take 30 mg by mouth at bedtime.  . Multiple Vitamin (MULTIVITAMIN WITH MINERALS) TABS tablet Take 1 tablet by mouth daily.  . nitroGLYCERIN (NITROSTAT) 0.4 MG SL tablet Place 1 tablet (0.4 mg total) under the tongue every 5 (five) minutes as needed for chest pain.  . OXYGEN Inhale 2 L into the lungs at bedtime. 2lpm with sleep  . polyethylene glycol (MIRALAX / GLYCOLAX) packet Take 17 g by mouth daily as needed for moderate constipation.   . Tamsulosin HCl (FLOMAX) 0.4 MG CAPS Take 0.8 mg by mouth at bedtime.   . vitamin B-12 (CYANOCOBALAMIN) 1000 MCG tablet Take 1,000 mcg by mouth daily.    Allergies:   Patient has no known allergies.   Social History   Social History  . Marital status: Married    Spouse  name: Peter Congo  . Number of children: 8  . Years of education: 47   Occupational History  .  Retired    Retired   Social History Main Topics  . Smoking status: Former Smoker    Packs/day: 0.50    Years: 40.00    Types: Cigarettes    Quit date: 07/23/1978  . Smokeless tobacco: Never Used  . Alcohol use No  . Drug use: No  . Sexual activity: Not Asked   Other Topics Concern  . None   Social History Narrative   Patient lives  at home with his wife. Peter Congo). Patient is retired. Patient has one Year of college.   Right handed.   Caffeine- sometimes     Family History:  The patient's family history includes Hypertension in his father and mother.   ROS:   Please see the history of present illness.    ROS All other systems reviewed and are negative.  No flowsheet data found.     PHYSICAL EXAM:   VS:  BP 118/64   Pulse 71   Ht 5\' 7"  (1.702 m)   Wt 165 lb 1.9 oz (74.9 kg)   SpO2 96%   BMI 25.86 kg/m    GEN: Well nourished, well developed, in no acute distress  HEENT: normal  Neck: no JVD, carotid bruits, or masses Cardiac: RRR; no murmurs, rubs, or gallops,no edema.  Intact distal pulses bilaterally.  Respiratory:  clear to auscultation bilaterally, normal work of breathing GI: soft, nontender, nondistended, + BS MS: no deformity or atrophy  Skin: warm and dry, no rash Neuro:  Alert and Oriented x 3, Strength and sensation are intact Psych: euthymic mood, full affect  Wt Readings from Last 3 Encounters:  10/11/16 165 lb 1.9 oz (74.9 kg)  06/29/16 165 lb (74.8 kg)  06/08/16 165 lb (74.8 kg)      Studies/Labs Reviewed:   EKG:  EKG is not ordered today.    Recent Labs: 03/12/2016: Brain Natriuretic Peptide 149.1 06/29/2016: BUN 25; Creatinine, Ser 2.67; Hemoglobin 10.5; Platelets 232; Potassium 4.4; Sodium 138 07/09/2016: ALT 12   Lipid Panel    Component Value Date/Time   CHOL 145 07/09/2016 1104   TRIG 201 (H) 07/09/2016 1104   HDL 27 (L) 07/09/2016 1104     CHOLHDL 5.4 (H) 07/09/2016 1104   VLDL 40 (H) 07/09/2016 1104   LDLCALC 78 07/09/2016 1104   LDLDIRECT 110.0 12/09/2014 1018    Additional studies/ records that were reviewed today include:  none    ASSESSMENT:    1. Coronary artery disease of native artery of native heart with stable angina pectoris ([place])   2. Asymptomatic bilateral carotid artery stenosis   3. Hypertension, essential   4. PVD (peripheral vascular disease) ([place])   5. Dyslipidemia      PLAN:  In order of problems listed above:  1. ASCAD - s/p CABG in 1997 with known atretic LIMA to LAD and 40% ostial LM.  He has not had any anginal symptoms.  He will continue on statin, ASA and BB.  2. Bilateral carotid artery stenosis -s/p left CEA > 20 yrs ago and > 70% right ICA stenosis .  He will continue on ASA and statin. He was supposed to be referred to Dr. Donnetta Hutching but never followed through.  I will set him up to see him.   3. HTN - Bp controlled.  He will continue on BB therapy.    4. PVD -  s/p left iliac stent and renal artery stenosis s/p left renal artery stent.  He will continue on statin and ASA.  No renal artery stenosis by dopplers 07/2016.   5.   Dyslipidemia - LDL goal is < 70.  He will continue on fenofibrate and statin.  He was started on Zetia a few months ago. His repeat LDL was 78.  Since he is not at goal, I will refer him to lipid clinic to see if he is a candidate for PCSK 9 inhibitor.  Medication Adjustments/Labs and Tests Ordered: Current medicines are reviewed at length  with the patient today.  Concerns regarding medicines are outlined above.  Medication changes, Labs and Tests ordered today are listed in the Patient Instructions below.  Patient Instructions  Medication Instructions:  Your physician recommends that you continue on your current medications as directed. Please refer to the Current Medication list given to you today.    Labwork: None  Testing/Procedures: None  Follow-Up: Your physician recommends that you schedule a follow-up appointment with the LIPID CLINIC.  Your physician recommends that you schedule a follow-up appointment i  Any Other Special Instructions Will Be Listed Below (If Applicable).     If you need a refill on your cardiac medications before your next appointment, please call your pharmacy.      Signed, Fransico Him, MD  10/11/2016 9:19 AM    [facility] [address], [place], [place]  [address] Phone: [phone]; Fax: [phone]

## 2016-10-12 DIAGNOSIS — F0390 Unspecified dementia without behavioral disturbance: Secondary | ICD-10-CM | POA: Diagnosis not present

## 2016-10-12 DIAGNOSIS — K5900 Constipation, unspecified: Secondary | ICD-10-CM | POA: Diagnosis not present

## 2016-10-12 DIAGNOSIS — R1313 Dysphagia, pharyngeal phase: Secondary | ICD-10-CM | POA: Diagnosis not present

## 2016-10-12 DIAGNOSIS — K219 Gastro-esophageal reflux disease without esophagitis: Secondary | ICD-10-CM | POA: Diagnosis not present

## 2016-10-12 DIAGNOSIS — R6889 Other general symptoms and signs: Secondary | ICD-10-CM | POA: Diagnosis not present

## 2016-10-12 DIAGNOSIS — K3184 Gastroparesis: Secondary | ICD-10-CM | POA: Diagnosis not present

## 2016-10-12 DIAGNOSIS — R630 Anorexia: Secondary | ICD-10-CM | POA: Diagnosis not present

## 2016-10-13 DIAGNOSIS — J449 Chronic obstructive pulmonary disease, unspecified: Secondary | ICD-10-CM | POA: Diagnosis not present

## 2016-10-13 DIAGNOSIS — J9611 Chronic respiratory failure with hypoxia: Secondary | ICD-10-CM | POA: Diagnosis not present

## 2016-10-13 DIAGNOSIS — J441 Chronic obstructive pulmonary disease with (acute) exacerbation: Secondary | ICD-10-CM | POA: Diagnosis not present

## 2016-10-14 ENCOUNTER — Encounter: Payer: Self-pay | Admitting: Cardiology

## 2016-10-16 ENCOUNTER — Ambulatory Visit: Payer: Medicare HMO

## 2016-10-18 ENCOUNTER — Ambulatory Visit (INDEPENDENT_AMBULATORY_CARE_PROVIDER_SITE_OTHER): Payer: Medicare HMO | Admitting: Neurology

## 2016-10-18 ENCOUNTER — Encounter: Payer: Self-pay | Admitting: Neurology

## 2016-10-18 VITALS — BP 122/82 | HR 71 | Ht 67.0 in | Wt 167.0 lb

## 2016-10-18 DIAGNOSIS — F0281 Dementia in other diseases classified elsewhere with behavioral disturbance: Secondary | ICD-10-CM

## 2016-10-18 DIAGNOSIS — G308 Other Alzheimer's disease: Secondary | ICD-10-CM | POA: Diagnosis not present

## 2016-10-18 DIAGNOSIS — G309 Alzheimer's disease, unspecified: Principal | ICD-10-CM

## 2016-10-18 MED ORDER — MEMANTINE HCL 10 MG PO TABS: 10.0000 mg | ORAL_TABLET | Freq: Two times a day (BID) | ORAL | 11 refills | Status: AC

## 2016-10-18 NOTE — Progress Notes (Signed)
[facility]  PATIENT: [name] [name] DOB: [birth year]  HISTORY OF PRESENT ILLNESS:[name] [name] is a 82 years old right-handed male follow-up for memory loss, he was a patient of Dr. Jules Husbands since February 2009, for evaluation of memory trouble, his primary care physician is Dr. Ivery Quale.  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 Some anxiety, improved on Remeron and prn Xanax.  He is occasionally irritable and easily provoked to anger. Wife says that he has decreased appetite recently. UPDATE June 2nd 2015: He now came in complaining of bilateral feet and hands paresthesia, which is new since Feb 2015, his symptoms are getting worse, he could hardly picking up things, he also complains of mild gait difficulty, low back pain, no bowel and bladder incontinence,  Recent laboratory evaluation in May 21st 2013, showed mild anemia, hemoglobin 11.8, glucose 96, B12 1004, normal TSH, protein electrophoresis showed elevated M spike a 1.3, mild increased of creatinine 1.57, with GFR of 33, normal liver function tests previously. He is going to be evaluated by hematologist Dr. Judeen Hammans soon. He also complains of low back pain, no significant neck pain,  UPDATE July 6th 2015: He was evaluated by Dr. Benay Spice for chronic  monoclonal IgG lambda protein. There is no clinical evidence of progression to multiple myeloma. The M spike has not changed significantly over many years.  He still has numbness tinglings in his fingers, even to his arms and chest, frightening to him. MRI lumbar in May 2015: L5-S1: pseudo-disc bulging and facet hypertrophy with mild-moderate right and moderate left foraminal stenosis. L3-4: disc bulging and facet hypertrophy with mild biforaminal stenosis. L4-5: pseudo-disc bulging and facet hypertrophy with mild biforaminal stenosis. notable for left renal cyst (5.7cm).  MRI cervical spine in May 2015: C5-6: disc bulging and uncovertebral joint hypertrophy with severe right and moderate left foraminal stenosis. Disc bulging and spondylosis from C4-5 to T2-3. He has history of chronic renal disease, is going to be see by his nephrologist Dr. Posey Pronto in August 2015, I have suggested continued followup with his incidental finding of a left renal cyst with his nephrologist Dr. Posey Pronto  UPDATE April 7th 2016: He continue has slow worsening memory trouble, taking Namenda xr 28 mg, Aricept 10 mg daily, COPD, recovering from pneumonia, Complains of excessive sleepiness,  He is not eating well, he has bad taste in his mouth, Ambulate without difficulty, he has stopped nortriptyline, taking gabapentin for lower extremity pain,   UPDATE Oct 17th 2017: He is with his wife at today's clinical visit, he took a bus here, complains of stomach pain, not feeling well, he was recently evaluated by his primary care physician Dr. Moreen Fowler, he continue has slow worsening memory loss, without behavior issues,  UPDATE October 18 2016: He is accompanied by his wife at today's clinical visit, he has worsening memory  loss, needing more help in his daily activity, has visual hallucinations, today's Mini-Mental status examination is only 8/8,  REVIEW OF SYSTEMS: Full 14 system review of systems performed and notable only  for those listed, all others are neg:  Activity change, appetite change, facial swelling, running nose, trouble swallowing, cough, wheezing, shortness of breath, chest tightness, cold intolerance, excessive thirst, swollen abdomen, abdominal pain, constipation, nausea, frequent wakening, sleep talking, frequent infection, painful urination, frequent urination, urgency, back pain, walking difficulty, neck stiffness, anemia, memory loss, speech difficulty, weakness, tremor, facial drooping, agitation, confusion, anxiety, hypertension activity just a minute  ALLERGIES: No Known Allergies  HOME MEDICATIONS: Outpatient Medications Prior to Visit  Medication Sig Dispense Refill  . acetaminophen (TYLENOL) 500 MG tablet Take 1,000 mg by mouth every 6 (six) hours as needed for headache.    Marland Kitchen aspirin 81 MG tablet Take 1 tablet (81 mg total) by mouth daily.    . calcitRIOL (ROCALTROL) 0.5 MCG capsule Take 0.5 mcg by mouth daily.    . Cholecalciferol (VITAMIN D) 2000 units CAPS Take 1 capsule by mouth every other day.    . ezetimibe (ZETIA) 10 MG tablet Take 10 mg by mouth daily.    . feeding supplement, ENSURE ENLIVE, (ENSURE ENLIVE) LIQD Take 237 mLs by mouth 2 (two) times daily between meals. 30 Bottle 6  . fenofibrate 160 MG tablet Take 1 tablet (160 mg total) by mouth daily. 30 tablet 11  . ferrous sulfate 325 (65 FE) MG tablet Take 325 mg by mouth every morning.     . finasteride (PROSCAR) 5 MG tablet Take 5 mg by mouth every morning.     . fluticasone (FLONASE) 50 MCG/ACT nasal spray Place 2 sprays into both nostrils daily as needed for allergies.     Marland Kitchen gabapentin (NEURONTIN) 300 MG capsule Take 1 capsule (300 mg total) by mouth 3 (three) times daily. (Patient taking differently: Take 300 mg by mouth every morning. ) 270 capsule 3  . ipratropium (ATROVENT) 0.06 % nasal spray Place 1 spray into both nostrils 4 (four) times daily.    Marland Kitchen ipratropium-albuterol (DUONEB) 0.5-2.5 (3) MG/3ML SOLN Take 3 mLs  by nebulization every 4 (four) hours as needed. (Patient taking differently: Take 3 mLs by nebulization every 4 (four) hours as needed (for shortness of breath). ) 360 mL 11  . lansoprazole (PREVACID) 30 MG capsule Take 30 mg by mouth daily.     Marland Kitchen latanoprost (XALATAN) 0.005 % ophthalmic solution Place 1 drop into both eyes at bedtime.     Marland Kitchen lubiprostone (AMITIZA) 24 MCG capsule Take 24 mcg by mouth daily as needed for constipation.     . metoCLOPramide (REGLAN) 10 MG tablet Take 10 mg by mouth 2 (two) times daily. Reported on 09/30/2015    . metoprolol succinate (TOPROL-XL) 25 MG 24 hr tablet Take 1 tablet (25 mg total) by mouth daily. 90 tablet 1  . Multiple Vitamin (MULTIVITAMIN WITH MINERALS) TABS tablet Take 1 tablet by mouth daily.    . nitroGLYCERIN (NITROSTAT) 0.4 MG SL tablet Place 1 tablet (0.4 mg total) under the tongue every 5 (five) minutes as needed for chest pain. 25 tablet 11  . OXYGEN Inhale 2 L into the lungs at bedtime. 2lpm with sleep    . polyethylene glycol (MIRALAX / GLYCOLAX) packet Take 17 g by mouth daily as needed for moderate constipation.     . Tamsulosin HCl (FLOMAX) 0.4 MG CAPS Take 0.8 mg by mouth at bedtime.     Marland Kitchen  vitamin B-12 (CYANOCOBALAMIN) 1000 MCG tablet Take 1,000 mcg by mouth daily.    . mirtazapine (REMERON) 30 MG tablet Take 30 mg by mouth at bedtime.    . rosuvastatin (CRESTOR) 40 MG tablet Take 1 tablet (40 mg total) by mouth daily. 30 tablet 11   No facility-administered medications prior to visit.     PAST MEDICAL HISTORY: Past Medical History:  Diagnosis Date  . Alzheimer disease   . Anemia   . BPH (benign prostatic hyperplasia)   . CAD (coronary artery disease)    s/p CABG in 1997 with known atretic LIMA to LAD and 40% LM.    Marland Kitchen Carotid artery stenosis    s/p left CEA.> 20 yrs ago and > 70% right ICA stenosis - followed by Dr. Donnetta Hutching  . Chronic chest wall pain   . Chronic kidney disease (CKD), stage III (moderate)   . COPD (chronic  obstructive pulmonary disease) ([place])   . Cough    cough with productive white to yellow sputum.  . DEMENTIA    "short term memory issues"  . Depression with anxiety   . GERD (gastroesophageal reflux disease)   . Glaucoma    bilateral  . Hyperlipemia   . Hypertension   . Memory loss   . Monoclonal gammopathy   . Neuromuscular disorder (HCC)    fingertips numbness"  . On home oxygen therapy    at bedtime-2 L/m nasally..    . Peptic ulcer disease   . PVD (peripheral vascular disease) ([place])    s/p left renal artery stent and left iliac stent  . Retinal artery occlusion     PAST SURGICAL HISTORY: Past Surgical History:  Procedure Laterality Date  . BACK SURGERY  2011   lower back -disc  . CARDIAC SURGERY    . CATARACT EXTRACTION W/PHACO  09/18/2011   Procedure: CATARACT EXTRACTION PHACO AND INTRAOCULAR LENS PLACEMENT (IOC);  Surgeon: Elta Guadeloupe T. Gershon Crane, MD;  Location: [facility];  Service: Ophthalmology;  Laterality: Right;  CDE: 6.19  . COLONOSCOPY WITH PROPOFOL N/A 07/11/2015   Procedure: COLONOSCOPY WITH PROPOFOL;  Surgeon: Laurence Spates, MD;  Location: [facility];  Service: Endoscopy;  Laterality: N/A;  . CORONARY ARTERY BYPASS GRAFT  1997   "nerve pain at area"  . ESOPHAGOGASTRODUODENOSCOPY (EGD) WITH PROPOFOL N/A 07/11/2015   Procedure: ESOPHAGOGASTRODUODENOSCOPY (EGD) WITH PROPOFOL;  Surgeon: Laurence Spates, MD;  [facility];  Service: Endoscopy;  Laterality: N/A;  . Left CEA  2002   carotid stent-left-   . RENAL ARTERY STENT      FAMILY HISTORY: Family History  Problem Relation Age of Onset  . Hypertension Father   . Hypertension Mother   . High blood pressure    . High Cholesterol    . Anesthesia problems Neg Hx   . Hypotension Neg Hx   . Malignant hyperthermia Neg Hx   . Pseudochol deficiency Neg Hx     SOCIAL HISTORY: Social History   Social History  . Marital status: Married    Spouse name: Peter Congo  . Number of children: 8  . Years of education:  75   Occupational History  .  Retired    Retired   Social History Main Topics  . Smoking status: Former Smoker    Packs/day: 0.50    Years: 40.00    Types: Cigarettes    Quit date: 07/23/1978  . Smokeless tobacco: Never Used  . Alcohol use No  . Drug use: No  . Sexual activity: Not  on file   Other Topics Concern  . Not on file   Social History Narrative   Patient lives at home with his wife. Peter Congo). Patient is retired. Patient has one Year of college.   Right handed.   Caffeine- sometimes     PHYSICAL EXAM  Vitals:   10/18/16 1437  BP: 122/82  Pulse: 71  Weight: 167 lb (75.8 kg)  Height: 5' 7"  (1.702 m)   Body mass index is 26.16 kg/m.  PHYSICAL EXAMNIATION:  Gen: NAD, conversant, well nourised, obese, well groomed                     Cardiovascular: Regular rate rhythm, no peripheral edema, warm, nontender. Eyes: Conjunctivae clear without exudates or hemorrhage Neck: Supple, no carotid bruise. Pulmonary: Clear to auscultation bilaterally   NEUROLOGICAL EXAM:  MENTAL STATUS: Speech:   Lack of spontaneous speech, slow to follow 1 step command Cognition: Mini-Mental Status Examination 8 8/30, animal naming 2,     Orientation: He is not oriented to date, year, month, season, clinic, city, county, state     Recent and remote memory: He missed 3 out of 3 recalls     Attention span and concentration: He has difficulties world backwards     He has difficulty naming, repeating, He has difficulty or write a sentence, difficulty following three-step command     CRANIAL NERVES: CN II:  Pupils are round equal and briskly reactive to light. CN III, IV, VI: extraocular movement are normal. No ptosis. CN V: Facial sensation is intact to pinprick in all 3 divisions bilaterally. Corneal responses are intact.  CN VII: Face is symmetric with normal eye closure and smile. CN VIII: Hearing is normal to rubbing fingers CN IX, X: Palate elevates symmetrically. Phonation is  normal. CN XI: Head turning and shoulder shrug are intact CN XII: Tongue is midline with normal movements and no atrophy.  MOTOR: No significant weakness,  REFLEXES: Present and symmetric  SENSORY: Length dependent decreased light touch, pinprick to distal leg,  COORDINATION: Rapid alternating movements and fine finger movements are intact. There is no dysmetria on finger-to-nose and heel-knee-shin.    GAIT/STANCE: Need to push on chair arm to get up from seated position, flexed forward, mildly unsteady  DIAGNOSTIC DATA (LABS, IMAGING, TESTING) - Lab Results  Component Value Date   CHOL 145 07/09/2016   HDL 27 (L) 07/09/2016   LDLCALC 78 07/09/2016   LDLDIRECT 110.0 12/09/2014   TRIG 201 (H) 07/09/2016   CHOLHDL 5.4 (H) 07/09/2016    ASSESSMENT AND PLAN 83 years old male,  Moderate dementia, Progressive worsening, with hallucinations.  Mini-Mental Status Examination 8/30, animal naming 5   Continue Namenda  Stop aricept  Polypharmacy treatment  I went over the medication list with patient and his wife   Gabapentin 300 mg tablets, I have advised his wife to decrease the dosage, if possible only nighttime as needed to decrease the side effect    Marcial Pacas, M.D. Ph.D.   [facility] [address], [place] [address] Phone: [phone] Fax:      [phone] CC: Antony Contras, MD

## 2016-10-23 ENCOUNTER — Telehealth: Payer: Self-pay | Admitting: Cardiology

## 2016-10-23 MED ORDER — FENOFIBRATE 160 MG PO TABS: 160.0000 mg | ORAL_TABLET | Freq: Every day | ORAL | 3 refills | Status: AC

## 2016-10-23 MED ORDER — EZETIMIBE 10 MG PO TABS: 10.0000 mg | ORAL_TABLET | Freq: Every day | ORAL | 3 refills | Status: AC

## 2016-10-23 MED ORDER — ROSUVASTATIN CALCIUM 40 MG PO TABS: 40.0000 mg | ORAL_TABLET | Freq: Every day | ORAL | 3 refills | Status: DC

## 2016-10-23 NOTE — Telephone Encounter (Signed)
New Message   *STAT* If patient is at the pharmacy, call can be transferred to refill team.   1. Which medications need to be refilled? (please list name of each medication and dose if known) Rosuvastatin 40mg , Ezetimibe 10mg , Fenofibrate 160mg    2. Which pharmacy/location (including street and city if local pharmacy) is medication to be sent to? Per wife new Pharmacy; [facility]  3. Do they need a 30 day or 90 day supply? [facility]

## 2016-10-24 DIAGNOSIS — N419 Inflammatory disease of prostate, unspecified: Secondary | ICD-10-CM | POA: Diagnosis not present

## 2016-10-24 DIAGNOSIS — R300 Dysuria: Secondary | ICD-10-CM | POA: Diagnosis not present

## 2016-10-24 DIAGNOSIS — R350 Frequency of micturition: Secondary | ICD-10-CM | POA: Diagnosis not present

## 2016-10-24 DIAGNOSIS — N401 Enlarged prostate with lower urinary tract symptoms: Secondary | ICD-10-CM | POA: Diagnosis not present

## 2016-10-26 ENCOUNTER — Other Ambulatory Visit: Payer: Self-pay

## 2016-10-26 NOTE — Patient Outreach (Signed)
[facility]) Care Management  10/26/2016  [name] [name] [birth year] [id number]   Telephone call to patient/caregiver for monthly call. No answer.  HIPAA compliant voice message left.    Plan: RN Health Coach will attempt patient/caregiver again in the month of April.    [name], RN, MSN [place] [phone]

## 2016-10-30 ENCOUNTER — Ambulatory Visit (INDEPENDENT_AMBULATORY_CARE_PROVIDER_SITE_OTHER): Payer: Medicare HMO | Admitting: Pharmacist

## 2016-10-30 ENCOUNTER — Encounter: Payer: Self-pay | Admitting: Pharmacist

## 2016-10-30 DIAGNOSIS — E785 Hyperlipidemia, unspecified: Secondary | ICD-10-CM

## 2016-10-30 NOTE — Patient Instructions (Addendum)
TAKE fenofibrate 160mg  WITH a meal  TAKE rosuvastatin (CRESTOR) 40mg  and ezetimibe (ZETIA) 10mg  IN THE EVENING   Cholesterol Cholesterol is a fat. Your body needs a small amount of cholesterol. Cholesterol (plaque) may build up in your blood vessels (arteries). That makes you more likely to have a heart attack or stroke. You cannot feel your cholesterol level. Having a blood test is the only way to find out if your level is high. Keep your test results. Work with your doctor to keep your cholesterol at a good level. What do the results mean?  Total cholesterol is how much cholesterol is in your blood.  LDL is bad cholesterol. This is the type that can build up. Try to have low LDL.  HDL is good cholesterol. It cleans your blood vessels and carries LDL away. Try to have high HDL.  Triglycerides are fat that the body can store or burn for energy. What are good levels of cholesterol?  Total cholesterol below 200.  LDL below 100 is good for people who have health risks. LDL below 70 is good for people who have very high risks.  HDL above 40 is good. It is best to have HDL of 60 or higher.  Triglycerides below 150. How can I lower my cholesterol? Diet  Follow your diet program as told by your doctor.  Choose fish, white meat chicken, or [place] that is roasted or baked. Try not to eat red meat, fried foods, sausage, or lunch meats.  Eat lots of fresh fruits and vegetables.  Choose whole grains, beans, pasta, potatoes, and cereals.  Choose olive oil, corn oil, or canola oil. Only use small amounts.  Try not to eat butter, mayonnaise, shortening, or palm kernel oils.  Try not to eat foods with trans fats.  Choose low-fat or nonfat dairy foods.  Drink skim or nonfat milk.  Eat low-fat or nonfat yogurt and cheeses.  Try not to drink whole milk or cream.  Try not to eat ice cream, egg yolks, or full-fat cheeses.  Healthy desserts include angel food cake, ginger snaps, animal  crackers, hard candy, popsicles, and low-fat or nonfat frozen yogurt. Try not to eat pastries, cakes, pies, and cookies. Exercise  Follow your exercise program as told by your doctor.  Be more active. Try gardening, walking, and taking the stairs.  Ask your doctor about ways that you can be more active. Medicine  Take over-the-counter and prescription medicines only as told by your doctor. This information is not intended to replace advice given to you by your health care provider. Make sure you discuss any questions you have with your health care provider. Document Released: 10/05/2008 Document Revised: 02/08/2016 Document Reviewed: 01/19/2016 Elsevier Interactive Patient Education  2017 [employer].

## 2016-10-30 NOTE — Progress Notes (Signed)
Patient ID: [name] [name]                 DOB: 06/26/1931                    MRN: [id number]     HPI: [name] [name] is a 83 y.o. male patient of Dr. Radford Pax that presents today for lipid evaluation. PMH includes ASCAD s/p CABG in 1997 with known atretic LIMA to LAD and 40% ostial LM, PVD s/p left iliac stent and renal artery stenosis s/p left renal artery stent, carotid artery stenosis s/p left CEA, dementia, HTN, CKD, noncardiac chest wall pain, dyslipidemia and COPD.  He presents today with his wife who manages his medications. She reports that he has been compliant with medications. She prepares all of his meals and medications for him due to his memory issues and swallowing issues.   LDL Goal: <70  Current Medications: Crestor 40mg  daily, fenofibrate 160mg  daily, ezetimibe 10mg  daily  Intolerances: none  Diet: He eats all his meals from home. He rarely eats fried foods. She does feed him potatoes a lot because he is a picky eater and those are easy to prepare given his swallowing issues.   Labs: 09/26/16:  TC 130, TG 202, HDL 23, LDL 78, nonHDL 107 - Crestor 40mg  daily, ezetimibe 10mg  daily, fenofibrate 160mg  daily 06/29/16:  TC 145, TG 201, HDL 27, LDL 78 - Crestor 40mg  daily, fenofibrate 160mg  daily  Past Medical History:  Diagnosis Date  . Alzheimer disease   . Anemia   . BPH (benign prostatic hyperplasia)   . CAD (coronary artery disease)    s/p CABG in 1997 with known atretic LIMA to LAD and 40% LM.    Marland Kitchen Carotid artery stenosis    s/p left CEA.> 20 yrs ago and > 70% right ICA stenosis - followed by Dr. Donnetta Hutching  . Chronic chest wall pain   . Chronic kidney disease (CKD), stage III (moderate)   . COPD (chronic obstructive pulmonary disease) ([place])   . Cough    cough with productive white to yellow sputum.  . DEMENTIA    "short term memory issues"  . Depression with anxiety   . GERD (gastroesophageal reflux disease)   . Glaucoma    bilateral  . Hyperlipemia   . Hypertension     . Memory loss   . Monoclonal gammopathy   . Neuromuscular disorder (HCC)    fingertips numbness"  . On home oxygen therapy    at bedtime-2 L/m nasally..    . Peptic ulcer disease   . PVD (peripheral vascular disease) ([place])    s/p left renal artery stent and left iliac stent  . Retinal artery occlusion     Current Outpatient Prescriptions on File Prior to Visit  Medication Sig Dispense Refill  . acetaminophen (TYLENOL) 500 MG tablet Take 1,000 mg by mouth every 6 (six) hours as needed for headache.    Marland Kitchen aspirin 81 MG tablet Take 1 tablet (81 mg total) by mouth daily.    . calcitRIOL (ROCALTROL) 0.5 MCG capsule Take 0.5 mcg by mouth daily.    . Cholecalciferol (VITAMIN D) 2000 units CAPS Take 1 capsule by mouth every other day.    . ezetimibe (ZETIA) 10 MG tablet Take 1 tablet (10 mg total) by mouth daily. 90 tablet 3  . feeding supplement, ENSURE ENLIVE, (ENSURE ENLIVE) LIQD Take 237 mLs by mouth 2 (two) times daily between meals. 30 Bottle 6  .  fenofibrate 160 MG tablet Take 1 tablet (160 mg total) by mouth daily. 90 tablet 3  . ferrous sulfate 325 (65 FE) MG tablet Take 325 mg by mouth every morning.     . finasteride (PROSCAR) 5 MG tablet Take 5 mg by mouth every morning.     . fluticasone (FLONASE) 50 MCG/ACT nasal spray Place 2 sprays into both nostrils daily as needed for allergies.     Marland Kitchen gabapentin (NEURONTIN) 300 MG capsule Take 1 capsule (300 mg total) by mouth 3 (three) times daily. (Patient taking differently: Take 300 mg by mouth every morning. ) 270 capsule 3  . ipratropium (ATROVENT) 0.06 % nasal spray Place 1 spray into both nostrils 4 (four) times daily.    Marland Kitchen ipratropium-albuterol (DUONEB) 0.5-2.5 (3) MG/3ML SOLN Take 3 mLs by nebulization every 4 (four) hours as needed. (Patient taking differently: Take 3 mLs by nebulization every 4 (four) hours as needed (for shortness of breath). ) 360 mL 11  . lansoprazole (PREVACID) 30 MG capsule Take 30 mg by mouth daily.     Marland Kitchen  latanoprost (XALATAN) 0.005 % ophthalmic solution Place 1 drop into both eyes at bedtime.     Marland Kitchen lubiprostone (AMITIZA) 24 MCG capsule Take 24 mcg by mouth daily as needed for constipation.     . memantine (NAMENDA) 10 MG tablet Take 1 tablet (10 mg total) by mouth 2 (two) times daily. 60 tablet 11  . metoCLOPramide (REGLAN) 10 MG tablet Take 10 mg by mouth 2 (two) times daily. Reported on 09/30/2015    . metoprolol succinate (TOPROL-XL) 25 MG 24 hr tablet Take 1 tablet (25 mg total) by mouth daily. 90 tablet 1  . Multiple Vitamin (MULTIVITAMIN WITH MINERALS) TABS tablet Take 1 tablet by mouth daily.    . nitroGLYCERIN (NITROSTAT) 0.4 MG SL tablet Place 1 tablet (0.4 mg total) under the tongue every 5 (five) minutes as needed for chest pain. 25 tablet 11  . OXYGEN Inhale 2 L into the lungs at bedtime. 2lpm with sleep    . polyethylene glycol (MIRALAX / GLYCOLAX) packet Take 17 g by mouth daily as needed for moderate constipation.     . rosuvastatin (CRESTOR) 40 MG tablet Take 1 tablet (40 mg total) by mouth daily. 90 tablet 3  . Tamsulosin HCl (FLOMAX) 0.4 MG CAPS Take 0.8 mg by mouth at bedtime.     . vitamin B-12 (CYANOCOBALAMIN) 1000 MCG tablet Take 1,000 mcg by mouth daily.     No current facility-administered medications on file prior to visit.     No Known Allergies  Assessment/Plan: Hyperlipidemia: LDL just above goal <70. Continue Crestor 40mg  and Zetia 10mg , but dose in evening since cholesterol production highest during evening. Continue fenofibrate 160mg  with food. He will also try to increase vegetable intake and limit carbohydrates. At this time I do not feel he is a great candidate for PSCK9i due to cost and memory issues. Will try lifestyle modifications to see if able to get to goal. Follow up lipid panel in 6 months.   Thank you,  Lelan Pons. Patterson Hamm[place], [facility]  10/30/2016 7:04 AM

## 2016-10-31 ENCOUNTER — Other Ambulatory Visit: Payer: Self-pay

## 2016-10-31 NOTE — Patient Outreach (Signed)
[facility]) Care Management  10/31/2016  [name] [name] [birth year] [id number]   Telephone call to caregiver for monthly call. Caregiver states she cannot talk right now and asked for a call back.  Advised that health coach would call another time.  She verbalized understanding.  Plan: RN Health Coach will contact caregiver again in the month of April.    [name], RN, MSN [place] [phone]

## 2016-11-01 DIAGNOSIS — N184 Chronic kidney disease, stage 4 (severe): Secondary | ICD-10-CM | POA: Diagnosis not present

## 2016-11-01 DIAGNOSIS — N2581 Secondary hyperparathyroidism of renal origin: Secondary | ICD-10-CM | POA: Diagnosis not present

## 2016-11-01 DIAGNOSIS — D631 Anemia in chronic kidney disease: Secondary | ICD-10-CM | POA: Diagnosis not present

## 2016-11-02 DIAGNOSIS — I739 Peripheral vascular disease, unspecified: Secondary | ICD-10-CM | POA: Diagnosis not present

## 2016-11-02 DIAGNOSIS — J441 Chronic obstructive pulmonary disease with (acute) exacerbation: Secondary | ICD-10-CM | POA: Diagnosis not present

## 2016-11-02 DIAGNOSIS — I10 Essential (primary) hypertension: Secondary | ICD-10-CM | POA: Diagnosis not present

## 2016-11-02 DIAGNOSIS — R0600 Dyspnea, unspecified: Secondary | ICD-10-CM | POA: Diagnosis not present

## 2016-11-06 DIAGNOSIS — H401131 Primary open-angle glaucoma, bilateral, mild stage: Secondary | ICD-10-CM | POA: Diagnosis not present

## 2016-11-08 ENCOUNTER — Other Ambulatory Visit: Payer: Self-pay

## 2016-11-08 NOTE — Patient Outreach (Signed)
[facility]) Care Management  11/08/2016  TYAIRE ODEM 1930/10/31 [id number]   Telephone call to caregiver for monthly call.  No answer.  HIPAA compliant voice message left.    Plan: RN Health Coach will attempt patient again in the month of May.   [name], RN, MSN [place] [phone]

## 2016-11-09 DIAGNOSIS — D631 Anemia in chronic kidney disease: Secondary | ICD-10-CM | POA: Diagnosis not present

## 2016-11-09 DIAGNOSIS — N184 Chronic kidney disease, stage 4 (severe): Secondary | ICD-10-CM | POA: Diagnosis not present

## 2016-11-09 DIAGNOSIS — I129 Hypertensive chronic kidney disease with stage 1 through stage 4 chronic kidney disease, or unspecified chronic kidney disease: Secondary | ICD-10-CM | POA: Diagnosis not present

## 2016-11-09 DIAGNOSIS — N2581 Secondary hyperparathyroidism of renal origin: Secondary | ICD-10-CM | POA: Diagnosis not present

## 2016-11-13 DIAGNOSIS — J9611 Chronic respiratory failure with hypoxia: Secondary | ICD-10-CM | POA: Diagnosis not present

## 2016-11-13 DIAGNOSIS — J449 Chronic obstructive pulmonary disease, unspecified: Secondary | ICD-10-CM | POA: Diagnosis not present

## 2016-11-13 DIAGNOSIS — J441 Chronic obstructive pulmonary disease with (acute) exacerbation: Secondary | ICD-10-CM | POA: Diagnosis not present

## 2016-11-19 ENCOUNTER — Other Ambulatory Visit: Payer: Self-pay

## 2016-11-19 DIAGNOSIS — I6523 Occlusion and stenosis of bilateral carotid arteries: Secondary | ICD-10-CM

## 2016-11-20 ENCOUNTER — Ambulatory Visit (INDEPENDENT_AMBULATORY_CARE_PROVIDER_SITE_OTHER)
Admission: RE | Admit: 2016-11-20 | Discharge: 2016-11-20 | Disposition: A | Discharge status: Home / Self Care | Payer: Medicare HMO | Source: Ambulatory Visit | Attending: Internal Medicine | Admitting: Internal Medicine

## 2016-11-20 ENCOUNTER — Encounter: Payer: Self-pay | Admitting: Internal Medicine

## 2016-11-20 ENCOUNTER — Ambulatory Visit (INDEPENDENT_AMBULATORY_CARE_PROVIDER_SITE_OTHER): Payer: Medicare HMO | Admitting: Internal Medicine

## 2016-11-20 VITALS — BP 104/60 | HR 74 | Ht 66.5 in | Wt 162.6 lb

## 2016-11-20 DIAGNOSIS — J9611 Chronic respiratory failure with hypoxia: Secondary | ICD-10-CM | POA: Diagnosis not present

## 2016-11-20 DIAGNOSIS — J449 Chronic obstructive pulmonary disease, unspecified: Secondary | ICD-10-CM

## 2016-11-20 DIAGNOSIS — I517 Cardiomegaly: Secondary | ICD-10-CM | POA: Diagnosis not present

## 2016-11-20 NOTE — Patient Instructions (Signed)
Add pepcid ac 20 mg at bedtime  Continue prevacid 30 mg before bfast and before supper     GERD (REFLUX)  is an extremely common cause of respiratory symptoms just like yours , many times with no obvious heartburn at all.    It can be treated with medication, but also with lifestyle changes including elevation of the head of your bed (ideally with 6 inch  bed blocks),  Smoking cessation, avoidance of late meals, excessive alcohol, and avoid fatty foods, chocolate, peppermint, colas, red wine, and acidic juices such as orange juice.  NO MINT OR MENTHOL PRODUCTS SO NO COUGH DROPS   USE SUGARLESS CANDY INSTEAD (Jolley ranchers or Stover's or Life Savers) or even ice chips will also do - the key is to swallow to prevent all throat clearing. NO OIL BASED VITAMINS - use powdered substitutes.   Please remember to go to the  x-ray department downstairs in the basement  for your tests - we will call you with the results when they are available.    If you are satisfied with your treatment plan,  let your doctor know and he/she can either refill your medications or you can return here when your prescription runs out.     If in any way you are not 100% satisfied,  please tell us.  If 100% better, tell your friends!  Pulmonary follow up is as needed

## 2016-11-20 NOTE — Progress Notes (Signed)
Subjective:    Patient ID: [name] [name], male    DOB: 09/09/1930  MRN: [id number]    Brief patient profile:  83 year old male quit smoking in 1980s  Followed previously by Dr Joya Gaskins for copd with gold III criteria April 2016    History of Present Illness   11/07/2015  f/u ov/Wert re: COPD GOLD III rx brovana/bud but can no longer afford / 02 2lpm hs only  Chief Complaint  Patient presents with  . Follow-up    Cough has improved some. He has had minimal wheezing and chest tightness- mainly at night and relates to allergies. His cough is prod at times with clear sputum.   >>change neb to [facility]   12/22/2015 Follow up : COPD III /Nocturnal O2  Patient returns for a 6 week follow-up for COPD. Since last visit. Patient was recently admitted last week for community-acquired pneumonia with right-sided infiltrate on x-ray. Patient has advanced dementia and presented to the emergency room with failure to thrive-like symptoms. He was treated with antibiotics with improvement in mentation.. Since discharge. Patient is improved with decreased cough and congestion. He does still get some intermittent thick mucus. And shortness of breath with activity. Wife is with him today . Says his swallow is better. Does see GI for esophageal motility , currently on reglan , amitiza , miralax and prevacid .  He was started on BREO last ov , had hard time to use this. Did get thrush . Wife has to help him with meds.  Neb were not covered with their [place] insurance rec Begin Stiolto 2 puffs daily , rinse well after use.  Aspiration precautions with no eating 3 hrs before bedtime, small meals .  Continue on Oxygen At bedtime      02/02/2016  f/u ov/Wert re:  GOLD II/ maint stiolto but not able to use correctly   / 0 2 2lpm at hs and prn daytime Chief Complaint  Patient presents with  . Follow-up    Breathing has improved some and only has occ cough. No new co's today.    very sedentary/ Not limited by  breathing from desired activities    rec Stop stiolto  For breathing >  Ok to use nebuilzer with Duoneb up to every 4 hours only as needed if can't catch your breath   11/20/2016  f/u ov/Wert re:   GOLD II on prn duoneb and 02 2lpm  Chief Complaint  Patient presents with  . Acute Visit    Pt c/o increased cough and dysphagia. Cough is prod with yellow sputum. Spouse states he gets strangled when he tried to swallow.   increased dysphagia x 3-4 month then cough got worse ? Aspiration but not coughing up food typically   On prevacid before bfast but using otc ? Strength   No obvious day to day or daytime variability or assoc excess/ purulent sputum or mucus plugs or hemoptysis or cp or chest tightness, subjective wheeze or overt sinus or hb symptoms. No unusual exp hx or h/o childhood pna/ asthma or knowledge of premature birth.  Sleeping ok without nocturnal  or early am exacerbation  of respiratory  c/o's or need for noct saba. Also denies any obvious fluctuation of symptoms with weather or environmental changes or other aggravating or alleviating factors except as outlined above   Current Medications, Allergies, Complete Past Medical History, Past Surgical History, Family History, and Social History were reviewed in [employer] record.  ROS  The following are not active complaints unless bolded sore throat, dysphagia, dental problems, itching, sneezing,  nasal congestion or excess/ purulent secretions, ear ache,   fever, chills, sweats, unintended wt loss, classically pleuritic or exertional cp,  orthopnea pnd or leg swelling, presyncope, palpitations, abdominal pain, anorexia, nausea, vomiting, diarrhea  or change in bowel or bladder habits, change in stools or urine, dysuria,hematuria,  rash, arthralgias, visual complaints, headache, numbness, weakness or ataxia or problems with walking or coordination,  change in mood/affect or memory.                     Objective:   Physical Exam  GEN: A/Ox3; pleasant , NAD, elderly stoic amb bm sits slouched over and lets wife to all the talking   11/20/2016        163   02/02/16 174 lb (78.926 kg)  01/13/16 170 lb (77.111 kg)  12/30/15 169 lb (76.658 kg)    Vital signs reviewed /  - Note on arrival 02 sats  94% on RA     HEENT:  [place]/AT,  EACs-clear, TMs-wnl, NOSE-clear, THROAT-clear, no lesions, no postnasal drip or exudate noted. - edentulous with dentures in place  NECK:  Supple w/ fair ROM; no JVD; normal carotid impulses w/o bruits; no thyromegaly or nodules palpated; no lymphadenopathy.    RESP    Minimal insp and exp rhonchi s true wheezing localized or otherwise   CARD:  RRR, no m/r/g  , no peripheral edema, pulses intact, no cyanosis or clubbing - no sign edema   GI:   Soft & nt; nml bowel sounds; no organomegaly or masses detected.   Musco: Warm bil, no deformities or joint swelling noted.   Neuro: ? Slowed mentation/ responses / ambulatory with  no focal motor  deficits noted.    Skin: Warm, no lesions or rashes    CXR PA and Lateral:   11/20/2016 :    I personally reviewed images and agree with radiology impression as follows:    Stable chronic interstitial disease right greater than left. No acute findings.     Assessment & Plan:   Outpatient Encounter Prescriptions as of 11/20/2016  Medication Sig  . acetaminophen (TYLENOL) 500 MG tablet Take 1,000 mg by mouth every 6 (six) hours as needed for headache.  Marland Kitchen aspirin 81 MG tablet Take 1 tablet (81 mg total) by mouth daily.  . calcitRIOL (ROCALTROL) 0.5 MCG capsule Take 0.5 mcg by mouth daily.  . Cholecalciferol (VITAMIN D) 2000 units CAPS Take 1 capsule by mouth every other day.  . ezetimibe (ZETIA) 10 MG tablet Take 1 tablet (10 mg total) by mouth daily.  . feeding supplement, ENSURE ENLIVE, (ENSURE ENLIVE) LIQD Take 237 mLs by mouth 2 (two) times daily between meals.  . fenofibrate 160 MG tablet Take 1 tablet (160 mg total) by  mouth daily.  . ferrous sulfate 325 (65 FE) MG tablet Take 325 mg by mouth every morning.   . finasteride (PROSCAR) 5 MG tablet Take 5 mg by mouth every morning.   . fluticasone (FLONASE) 50 MCG/ACT nasal spray Place 2 sprays into both nostrils daily as needed for allergies.   Marland Kitchen gabapentin (NEURONTIN) 300 MG capsule Take 1 capsule (300 mg total) by mouth 3 (three) times daily. (Patient taking differently: Take 300 mg by mouth every morning. )  . ipratropium (ATROVENT) 0.06 % nasal spray Place 1 spray into both nostrils 4 (four) times daily.  Marland Kitchen ipratropium-albuterol (DUONEB) 0.5-2.5 (3) MG/3ML  SOLN Take 3 mLs by nebulization every 4 (four) hours as needed. (Patient taking differently: Take 3 mLs by nebulization every 4 (four) hours as needed (for shortness of breath). )  . lansoprazole (PREVACID) 30 MG capsule Take 30 mg by mouth daily.   Marland Kitchen latanoprost (XALATAN) 0.005 % ophthalmic solution Place 1 drop into both eyes at bedtime.   Marland Kitchen lubiprostone (AMITIZA) 24 MCG capsule Take 24 mcg by mouth daily as needed for constipation.   . memantine (NAMENDA) 10 MG tablet Take 1 tablet (10 mg total) by mouth 2 (two) times daily.  . metoCLOPramide (REGLAN) 10 MG tablet Take 10 mg by mouth 2 (two) times daily. Reported on 09/30/2015  . metoprolol succinate (TOPROL-XL) 25 MG 24 hr tablet Take 1 tablet (25 mg total) by mouth daily.  . Multiple Vitamin (MULTIVITAMIN WITH MINERALS) TABS tablet Take 1 tablet by mouth daily.  . nitroGLYCERIN (NITROSTAT) 0.4 MG SL tablet Place 1 tablet (0.4 mg total) under the tongue every 5 (five) minutes as needed for chest pain.  . OXYGEN Inhale 2 L into the lungs at bedtime. 2lpm with sleep  . polyethylene glycol (MIRALAX / GLYCOLAX) packet Take 17 g by mouth daily as needed for moderate constipation.   . rosuvastatin (CRESTOR) 40 MG tablet Take 1 tablet (40 mg total) by mouth daily.  . Tamsulosin HCl (FLOMAX) 0.4 MG CAPS Take 0.8 mg by mouth at bedtime.   . vitamin B-12  (CYANOCOBALAMIN) 1000 MCG tablet Take 1,000 mcg by mouth daily.   No facility-administered encounter medications on file as of 11/20/2016.

## 2016-11-21 NOTE — Assessment & Plan Note (Addendum)
Enrolled in Gold COPD program 10/27/2014 Spirometry  10/27/14  FEV1  1.86 (73%) ratio 66  - Added bud to brovana 04/26/2015 > improved but could not afford deductible > changed to [place] 11/07/2015  -  02/02/2016 changed to duoneb prn for insurance purposes  And because could not learn respimat - See ST eval 06/26/16     Despite obvious concerns with aspiration issues he's relatively well compensated just on the duoneb with no obvious acute aspiraton issues though is certainly at risk but already eval by ST and nothing to add to their recs other than make sure max rx for gerd is being carried out  I had an extended discussion with the patient/wife reviewing all relevant studies completed to date and  lasting 15 to 20 minutes of a 25 minute visit    Each maintenance medication was reviewed in detail including most importantly the difference between maintenance and prns and under what circumstances the prns are to be triggered using an action plan format that is not reflected in the computer generated alphabetically organized AVS.    Please see AVS for specific instructions unique to this visit that I personally wrote and verbalized to the the pt in detail and then reviewed with pt  by my nurse highlighting any  changes in therapy recommended at today's visit to their plan of care.

## 2016-11-21 NOTE — Assessment & Plan Note (Signed)
04/26/2015   Walked RA  2 laps @ 185 ft each stopped due to  Leg pain, slow pace, sats at very end down to 85%  - 08/25/2015   [facility] RA  2 laps @ 185 ft each stopped due to  Tired, no sob, sats 87% (walking much more than he usually does  As of 11/20/2016 rec 2lpm hs and prn daytime, certainly no need at rest

## 2016-11-21 NOTE — Progress Notes (Signed)
Spoke with pt and notified of results per Dr. Wert. Pt verbalized understanding and denied any questions. 

## 2016-11-22 DIAGNOSIS — Z125 Encounter for screening for malignant neoplasm of prostate: Secondary | ICD-10-CM | POA: Diagnosis not present

## 2016-11-22 DIAGNOSIS — N410 Acute prostatitis: Secondary | ICD-10-CM | POA: Diagnosis not present

## 2016-11-22 DIAGNOSIS — R350 Frequency of micturition: Secondary | ICD-10-CM | POA: Diagnosis not present

## 2016-11-30 ENCOUNTER — Other Ambulatory Visit: Payer: Self-pay

## 2016-11-30 NOTE — Patient Outreach (Signed)
[facility]) Care Management  [place]  11/30/2016   [name] [name] [birth year] [id number]  Subjective: Telephone call to caregiver. She reports that patient is doing about the same but as had some problems with urinary tract infection. Patient is on Augmentin currently.  Discussed with wife signs of worsening urinary tract infection.  She reports some wheezing at times and that patient saw pulmonary doctor recently and was told he did not need to do the nebulizer treatments routinely.  She continues to use thickner for his liquids for problems with his swallowing.  Discussed with her his alzheimer's and how it is a progressive disease.  She verbalized understanding.  Objective:   Encounter Medications:  Outpatient Encounter Prescriptions as of 11/30/2016  Medication Sig  . acetaminophen (TYLENOL) 500 MG tablet Take 1,000 mg by mouth every 6 (six) hours as needed for headache.  Marland Kitchen amoxicillin-clavulanate (AUGMENTIN) 500-125 MG tablet Take 1 tablet by mouth 2 (two) times daily.  Marland Kitchen aspirin 81 MG tablet Take 1 tablet (81 mg total) by mouth daily.  . calcitRIOL (ROCALTROL) 0.5 MCG capsule Take 0.5 mcg by mouth daily.  . Cholecalciferol (VITAMIN D) 2000 units CAPS Take 1 capsule by mouth every other day.  . ezetimibe (ZETIA) 10 MG tablet Take 1 tablet (10 mg total) by mouth daily.  . feeding supplement, ENSURE ENLIVE, (ENSURE ENLIVE) LIQD Take 237 mLs by mouth 2 (two) times daily between meals.  . fenofibrate 160 MG tablet Take 1 tablet (160 mg total) by mouth daily.  . ferrous sulfate 325 (65 FE) MG tablet Take 325 mg by mouth every morning.   . finasteride (PROSCAR) 5 MG tablet Take 5 mg by mouth every morning.   . fluticasone (FLONASE) 50 MCG/ACT nasal spray Place 2 sprays into both nostrils daily as needed for allergies.   Marland Kitchen gabapentin (NEURONTIN) 300 MG capsule Take 1 capsule (300 mg total) by mouth 3 (three) times daily. (Patient taking differently: Take 300 mg by  mouth every morning. )  . ipratropium (ATROVENT) 0.06 % nasal spray Place 1 spray into both nostrils 4 (four) times daily.  Marland Kitchen ipratropium-albuterol (DUONEB) 0.5-2.5 (3) MG/3ML SOLN Take 3 mLs by nebulization every 4 (four) hours as needed. (Patient taking differently: Take 3 mLs by nebulization every 4 (four) hours as needed (for shortness of breath). )  . lansoprazole (PREVACID) 30 MG capsule Take 30 mg by mouth daily.   Marland Kitchen latanoprost (XALATAN) 0.005 % ophthalmic solution Place 1 drop into both eyes at bedtime.   Marland Kitchen lubiprostone (AMITIZA) 24 MCG capsule Take 24 mcg by mouth daily as needed for constipation.   . metoCLOPramide (REGLAN) 10 MG tablet Take 10 mg by mouth 2 (two) times daily. Reported on 09/30/2015  . metoprolol succinate (TOPROL-XL) 25 MG 24 hr tablet Take 1 tablet (25 mg total) by mouth daily.  . Multiple Vitamin (MULTIVITAMIN WITH MINERALS) TABS tablet Take 1 tablet by mouth daily.  . nitroGLYCERIN (NITROSTAT) 0.4 MG SL tablet Place 1 tablet (0.4 mg total) under the tongue every 5 (five) minutes as needed for chest pain.  . OXYGEN Inhale 2 L into the lungs at bedtime. 2lpm with sleep  . polyethylene glycol (MIRALAX / GLYCOLAX) packet Take 17 g by mouth daily as needed for moderate constipation.   . rosuvastatin (CRESTOR) 40 MG tablet Take 1 tablet (40 mg total) by mouth daily.  . Tamsulosin HCl (FLOMAX) 0.4 MG CAPS Take 0.8 mg by mouth at bedtime.   . vitamin B-12 (  CYANOCOBALAMIN) 1000 MCG tablet Take 1,000 mcg by mouth daily.  . memantine (NAMENDA) 10 MG tablet Take 1 tablet (10 mg total) by mouth 2 (two) times daily.   No facility-administered encounter medications on file as of 11/30/2016.     Functional Status:  In your present state of health, do you have any difficulty performing the following activities: 02/28/2016 01/26/2016  Hearing? N -  Vision? Y -  Difficulty concentrating or making decisions? Y -  Walking or climbing stairs? Y -  Dressing or bathing? Y -  Doing  errands, shopping? Y -  Conservation officer, nature and eating ? Y -  Using the Toilet? N -  In the past six months, have you accidently leaked urine? Y -  Do you have problems with loss of bowel control? Y (No Data)  Managing your Medications? Y -  Managing your Finances? Y -  Housekeeping or managing your Housekeeping? Y -  Some recent data might be hidden    Fall/Depression Screening: Fall Risk  11/30/2016 09/27/2016 08/30/2016  Falls in the past year? No No No  Risk for fall due to : - - -  Risk for fall due to (comments): - - -   PHQ 2/9 Scores 11/30/2016 08/30/2016 08/06/2016 06/22/2016 05/24/2016 04/23/2016 03/29/2016  PHQ - 2 Score 0 0 0 0 0 0 0  Exception Documentation - - - - - - -    Assessment: Patient continues to benefit from health coach outreach for disease management and support.    Plan:  [facility] CM Care Plan Problem One     Most Recent Value  Care Plan Problem One  COPD knowledge deficit  Role Documenting the Problem One  Health Coach  Care Plan for Problem One  Active  THN Long Term Goal (31-90 days)  Caregiver will be able to verbalize action plan for COPD exacerbation in the next 90 days  THN Long Term Goal Start Date  11/30/16 [goal continued]  Interventions for Problem One Long Term Goal  RN Health Coach reiterated with caregiver signs and symptoms of COPD and when to notify physician.      [facility] CM Care Plan Problem Two     Most Recent Value  Care Plan Problem Two  Impaired Swallowing  Role Documenting the Problem Two  [place] for Problem Two  Active  Interventions for Problem Two Long Term Goal   RN Health Coach reiterated with caregiver chin tuck method when patient is swallowing and not using a straw for drinking.  Also reviewed signs and aspiration pneumonia and when to notify physician.    THN Long Term Goal (31-90) days  Caregiver will verbalize utilizing interventions to minimize aspiration within the next 60 days.  THN Long Term Goal Start Date  11/30/16  [goal continued]    [facility] CM Care Plan Problem Three     Most Recent Value  Care Plan Problem Three  Alzheimer's Disease  Role Documenting the Problem [place] for Problem Three  Active  THN Long Term Goal (31-90) days  Caregiver will verbalize understanding of alzheimer disease process and care within 90 days.    THN Long Term Goal Start Date  11/30/16 [goal continued]  Interventions for Problem Three Long Term Goal  RN Health Coach reiterated with caregiver wife alzheimer's disease and symptoms of decline.      RN Health Coach will contact patient in the month of June and patient agrees to next  outreach.  [name], RN, MSN [place] [phone]

## 2016-12-02 DIAGNOSIS — R0600 Dyspnea, unspecified: Secondary | ICD-10-CM | POA: Diagnosis not present

## 2016-12-02 DIAGNOSIS — I10 Essential (primary) hypertension: Secondary | ICD-10-CM | POA: Diagnosis not present

## 2016-12-02 DIAGNOSIS — J441 Chronic obstructive pulmonary disease with (acute) exacerbation: Secondary | ICD-10-CM | POA: Diagnosis not present

## 2016-12-02 DIAGNOSIS — I739 Peripheral vascular disease, unspecified: Secondary | ICD-10-CM | POA: Diagnosis not present

## 2016-12-04 ENCOUNTER — Encounter: Payer: Medicare HMO | Admitting: Vascular Surgery

## 2016-12-04 ENCOUNTER — Encounter (HOSPITAL_COMMUNITY): Payer: Medicare HMO

## 2016-12-20 DIAGNOSIS — N410 Acute prostatitis: Secondary | ICD-10-CM | POA: Diagnosis not present

## 2016-12-24 DIAGNOSIS — K602 Anal fissure, unspecified: Secondary | ICD-10-CM | POA: Diagnosis not present

## 2016-12-25 ENCOUNTER — Other Ambulatory Visit: Payer: Self-pay

## 2016-12-25 NOTE — Patient Outreach (Signed)
[name] [name]) Care Management  [name] [name]  12/25/2016   [name] [name] [birth year] [id number]  Subjective: Telephone call to caregiver spouse. She reports that patient is doing ok but some noted change in his dementia.  She reports he saw the primary doctor as patient has some rectal pain which where hemorrhoids.  She reports that steroid cream was ordered.  She reports still some problems with coughing and this is due to his swallowing.  Discussed with wife swallowing precautions and COPD symptoms and when to notify physician.  She verbalized understanding.    Objective:   Encounter Medications:  Outpatient Encounter Prescriptions as of 12/25/2016  Medication Sig  . acetaminophen (TYLENOL) 500 MG tablet Take 1,000 mg by mouth every 6 (six) hours as needed for headache.  Marland Kitchen amoxicillin-clavulanate (AUGMENTIN) 500-125 MG tablet Take 1 tablet by mouth 2 (two) times daily.  Marland Kitchen aspirin 81 MG tablet Take 1 tablet (81 mg total) by mouth daily.  . calcitRIOL (ROCALTROL) 0.5 MCG capsule Take 0.5 mcg by mouth daily.  . Cholecalciferol (VITAMIN D) 2000 units CAPS Take 1 capsule by mouth every other day.  . ezetimibe (ZETIA) 10 MG tablet Take 1 tablet (10 mg total) by mouth daily.  . feeding supplement, ENSURE ENLIVE, (ENSURE ENLIVE) LIQD Take 237 mLs by mouth 2 (two) times daily between meals.  . fenofibrate 160 MG tablet Take 1 tablet (160 mg total) by mouth daily.  . ferrous sulfate 325 (65 FE) MG tablet Take 325 mg by mouth every morning.   . finasteride (PROSCAR) 5 MG tablet Take 5 mg by mouth every morning.   . fluticasone (FLONASE) 50 MCG/ACT nasal spray Place 2 sprays into both nostrils daily as needed for allergies.   Marland Kitchen gabapentin (NEURONTIN) 300 MG capsule Take 1 capsule (300 mg total) by mouth 3 (three) times daily. (Patient taking differently: Take 300 mg by mouth every morning. )  . ipratropium (ATROVENT) 0.06 % nasal spray Place 1 spray into both nostrils 4 (four) times  daily.  . Lactobacillus (ACIDOPHILUS PO) Take 1 tablet by mouth daily.  . lansoprazole (PREVACID) 30 MG capsule Take 30 mg by mouth daily.   Marland Kitchen latanoprost (XALATAN) 0.005 % ophthalmic solution Place 1 drop into both eyes at bedtime.   Marland Kitchen lubiprostone (AMITIZA) 24 MCG capsule Take 24 mcg by mouth daily as needed for constipation.   . memantine (NAMENDA) 10 MG tablet Take 1 tablet (10 mg total) by mouth 2 (two) times daily.  . metoCLOPramide (REGLAN) 10 MG tablet Take 10 mg by mouth 2 (two) times daily. Reported on 09/30/2015  . metoprolol succinate (TOPROL-XL) 25 MG 24 hr tablet Take 1 tablet (25 mg total) by mouth daily.  . Multiple Vitamin (MULTIVITAMIN WITH MINERALS) TABS tablet Take 1 tablet by mouth daily.  . nitroGLYCERIN (NITROSTAT) 0.4 MG SL tablet Place 1 tablet (0.4 mg total) under the tongue every 5 (five) minutes as needed for chest pain.  . polyethylene glycol (MIRALAX / GLYCOLAX) packet Take 17 g by mouth daily as needed for moderate constipation.   . rosuvastatin (CRESTOR) 40 MG tablet Take 1 tablet (40 mg total) by mouth daily.  . Tamsulosin HCl (FLOMAX) 0.4 MG CAPS Take 0.8 mg by mouth at bedtime.   . vitamin B-12 (CYANOCOBALAMIN) 1000 MCG tablet Take 1,000 mcg by mouth daily.  Marland Kitchen ipratropium-albuterol (DUONEB) 0.5-2.5 (3) MG/3ML SOLN Take 3 mLs by nebulization every 4 (four) hours as needed. (Patient not taking: Reported on 12/25/2016)  . OXYGEN  Inhale 2 L into the lungs at bedtime. 2lpm with sleep   No facility-administered encounter medications on file as of 12/25/2016.     Functional Status:  In your present state of health, do you have any difficulty performing the following activities: 02/28/2016 01/26/2016  Hearing? N -  Vision? Y -  Difficulty concentrating or making decisions? Y -  Walking or climbing stairs? Y -  Dressing or bathing? Y -  Doing errands, shopping? Y -  Conservation officer, nature and eating ? Y -  Using the Toilet? N -  In the past six months, have you accidently  leaked urine? Y -  Do you have problems with loss of bowel control? Y (No Data)  Managing your Medications? Y -  Managing your Finances? Y -  Housekeeping or managing your Housekeeping? Y -  Some recent data might be hidden    Fall/Depression Screening: Fall Risk  12/25/2016 11/30/2016 09/27/2016  Falls in the past year? No No No  Risk for fall due to : - - -  Risk for fall due to (comments): - - -   PHQ 2/9 Scores 12/25/2016 11/30/2016 08/30/2016 08/06/2016 06/22/2016 05/24/2016 04/23/2016  PHQ - 2 Score 0 0 0 0 0 0 0  Exception Documentation - - - - - - -    Assessment: Patient continues to benefit from health coach outreach for disease management and support.    Plan:  [place] CM Care Plan Problem One     Most Recent Value  Care Plan Problem One  COPD knowledge deficit  Role Documenting the Problem One  Health Coach  Care Plan for Problem One  Active  THN Long Term Goal   Caregiver will be able to verbalize action plan for COPD exacerbation in the next 90 days  THN Long Term Goal Start Date  12/25/16 [goal continued]  Interventions for Problem One Long Term Goal  RN Health Coach reviewed with caregiver signs and symptoms of COPD and when to notify physician.      [facility] CM Care Plan Problem Two     Most Recent Value  Care Plan Problem Two  Impaired Swallowing  Role Documenting the Problem Two  [place] for Problem Two  Active  Interventions for Problem Two Long Term Goal   RN Health Coach reviewed with caregiver chin tuck method when patient is swallowing and not using a straw for drinking.  Also reviewed signs and aspiration pneumonia and when to notify physician.    THN Long Term Goal  Caregiver will verbalize utilizing interventions to minimize aspiration within the next 60 days.  THN Long Term Goal Start Date  12/25/16 [goal continued]    [facility] CM Care Plan Problem Three     Most Recent Value  Care Plan Problem Three  Alzheimer's Disease  Role Documenting the Problem [place] for Problem Three  Active  THN Long Term Goal   Caregiver will verbalize understanding of alzheimer disease process and care within 90 days.    THN Long Term Goal Start Date  12/25/16 [goal continued]  Interventions for Problem Three Long Term Goal  RN Health Coach reviewed with caregiver wife alzheimer's disease and symptoms of decline.      RN Health Coach will contact patient in the month of July and patient agrees to next outreach.  [name], RN, MSN [place] [phone]

## 2016-12-31 DIAGNOSIS — K602 Anal fissure, unspecified: Secondary | ICD-10-CM | POA: Diagnosis not present

## 2016-12-31 DIAGNOSIS — G629 Polyneuropathy, unspecified: Secondary | ICD-10-CM | POA: Diagnosis not present

## 2016-12-31 DIAGNOSIS — K219 Gastro-esophageal reflux disease without esophagitis: Secondary | ICD-10-CM | POA: Diagnosis not present

## 2016-12-31 DIAGNOSIS — E1122 Type 2 diabetes mellitus with diabetic chronic kidney disease: Secondary | ICD-10-CM | POA: Diagnosis not present

## 2016-12-31 DIAGNOSIS — I10 Essential (primary) hypertension: Secondary | ICD-10-CM | POA: Diagnosis not present

## 2016-12-31 DIAGNOSIS — N184 Chronic kidney disease, stage 4 (severe): Secondary | ICD-10-CM | POA: Diagnosis not present

## 2016-12-31 DIAGNOSIS — D649 Anemia, unspecified: Secondary | ICD-10-CM | POA: Diagnosis not present

## 2016-12-31 DIAGNOSIS — J309 Allergic rhinitis, unspecified: Secondary | ICD-10-CM | POA: Diagnosis not present

## 2016-12-31 DIAGNOSIS — F0390 Unspecified dementia without behavioral disturbance: Secondary | ICD-10-CM | POA: Diagnosis not present

## 2017-01-02 DIAGNOSIS — R0600 Dyspnea, unspecified: Secondary | ICD-10-CM | POA: Diagnosis not present

## 2017-01-02 DIAGNOSIS — I10 Essential (primary) hypertension: Secondary | ICD-10-CM | POA: Diagnosis not present

## 2017-01-02 DIAGNOSIS — J441 Chronic obstructive pulmonary disease with (acute) exacerbation: Secondary | ICD-10-CM | POA: Diagnosis not present

## 2017-01-02 DIAGNOSIS — I739 Peripheral vascular disease, unspecified: Secondary | ICD-10-CM | POA: Diagnosis not present

## 2017-01-10 ENCOUNTER — Encounter (HOSPITAL_COMMUNITY): Payer: Self-pay | Admitting: Cardiology

## 2017-01-10 ENCOUNTER — Emergency Department (HOSPITAL_COMMUNITY): Payer: Medicare HMO

## 2017-01-10 ENCOUNTER — Emergency Department (HOSPITAL_COMMUNITY)
Admission: EM | Admit: 2017-01-10 | Discharge: 2017-01-10 | Disposition: A | Discharge status: Home / Self Care | Payer: Medicare HMO | Attending: Emergency Medicine | Admitting: Emergency Medicine

## 2017-01-10 DIAGNOSIS — K4090 Unilateral inguinal hernia, without obstruction or gangrene, not specified as recurrent: Secondary | ICD-10-CM | POA: Diagnosis not present

## 2017-01-10 DIAGNOSIS — J9611 Chronic respiratory failure with hypoxia: Secondary | ICD-10-CM | POA: Insufficient documentation

## 2017-01-10 DIAGNOSIS — Z951 Presence of aortocoronary bypass graft: Secondary | ICD-10-CM | POA: Diagnosis not present

## 2017-01-10 DIAGNOSIS — Z7982 Long term (current) use of aspirin: Secondary | ICD-10-CM | POA: Diagnosis not present

## 2017-01-10 DIAGNOSIS — Z79899 Other long term (current) drug therapy: Secondary | ICD-10-CM | POA: Insufficient documentation

## 2017-01-10 DIAGNOSIS — R05 Cough: Secondary | ICD-10-CM | POA: Diagnosis not present

## 2017-01-10 DIAGNOSIS — J449 Chronic obstructive pulmonary disease, unspecified: Secondary | ICD-10-CM | POA: Diagnosis not present

## 2017-01-10 DIAGNOSIS — G309 Alzheimer's disease, unspecified: Secondary | ICD-10-CM | POA: Diagnosis not present

## 2017-01-10 DIAGNOSIS — K6289 Other specified diseases of anus and rectum: Secondary | ICD-10-CM | POA: Diagnosis not present

## 2017-01-10 DIAGNOSIS — I129 Hypertensive chronic kidney disease with stage 1 through stage 4 chronic kidney disease, or unspecified chronic kidney disease: Secondary | ICD-10-CM | POA: Diagnosis not present

## 2017-01-10 DIAGNOSIS — Z87891 Personal history of nicotine dependence: Secondary | ICD-10-CM | POA: Diagnosis not present

## 2017-01-10 DIAGNOSIS — N183 Chronic kidney disease, stage 3 (moderate): Secondary | ICD-10-CM | POA: Insufficient documentation

## 2017-01-10 DIAGNOSIS — I25810 Atherosclerosis of coronary artery bypass graft(s) without angina pectoris: Secondary | ICD-10-CM | POA: Diagnosis not present

## 2017-01-10 DIAGNOSIS — K5730 Diverticulosis of large intestine without perforation or abscess without bleeding: Secondary | ICD-10-CM | POA: Diagnosis not present

## 2017-01-10 DIAGNOSIS — K5900 Constipation, unspecified: Secondary | ICD-10-CM | POA: Diagnosis present

## 2017-01-10 HISTORY — DX: Inflammatory disease of prostate, unspecified: N41.9

## 2017-01-10 LAB — URINALYSIS, ROUTINE W REFLEX MICROSCOPIC
Bacteria, UA: NONE SEEN
Bilirubin Urine: NEGATIVE
Glucose, UA: NEGATIVE mg/dL
Ketones, ur: NEGATIVE mg/dL
Leukocytes, UA: NEGATIVE
Nitrite: NEGATIVE
PH: 5 (ref 5.0–8.0)
Protein, ur: NEGATIVE mg/dL
SPECIFIC GRAVITY, URINE: 1.014 (ref 1.005–1.030)

## 2017-01-10 LAB — CBC WITH DIFFERENTIAL/PLATELET
Basophils Absolute: 0 10*3/uL (ref 0.0–0.1)
Basophils Relative: 0 %
EOS PCT: 4 %
Eosinophils Absolute: 0.2 10*3/uL (ref 0.0–0.7)
HCT: 34.6 % — ABNORMAL LOW (ref 39.0–52.0)
Hemoglobin: 11.5 g/dL — ABNORMAL LOW (ref 13.0–17.0)
LYMPHS ABS: 2.1 10*3/uL (ref 0.7–4.0)
LYMPHS PCT: 37 %
MCH: 32.7 pg (ref 26.0–34.0)
MCHC: 33.2 g/dL (ref 30.0–36.0)
MCV: 98.3 fL (ref 78.0–100.0)
MONO ABS: 0.5 10*3/uL (ref 0.1–1.0)
MONOS PCT: 8 %
Neutro Abs: 2.9 10*3/uL (ref 1.7–7.7)
Neutrophils Relative %: 51 %
PLATELETS: 187 10*3/uL (ref 150–400)
RBC: 3.52 MIL/uL — ABNORMAL LOW (ref 4.22–5.81)
RDW: 14.7 % (ref 11.5–15.5)
WBC: 5.7 10*3/uL (ref 4.0–10.5)

## 2017-01-10 LAB — COMPREHENSIVE METABOLIC PANEL
ALT: 16 U/L — AB (ref 17–63)
AST: 28 U/L (ref 15–41)
Albumin: 3.8 g/dL (ref 3.5–5.0)
Alkaline Phosphatase: 28 U/L — ABNORMAL LOW (ref 38–126)
Anion gap: 10 (ref 5–15)
BUN: 25 mg/dL — AB (ref 6–20)
CO2: 22 mmol/L (ref 22–32)
CREATININE: 2.47 mg/dL — AB (ref 0.61–1.24)
Calcium: 9.8 mg/dL (ref 8.9–10.3)
Chloride: 109 mmol/L (ref 101–111)
GFR calc Af Amer: 26 mL/min — ABNORMAL LOW (ref >60)
GFR calc non Af Amer: 23 mL/min — ABNORMAL LOW (ref >60)
Glucose, Bld: 92 mg/dL (ref 65–99)
Potassium: 4.6 mmol/L (ref 3.5–5.1)
Sodium: 141 mmol/L (ref 135–145)
Total Bilirubin: 0.3 mg/dL (ref 0.3–1.2)
Total Protein: 8 g/dL (ref 6.5–8.1)

## 2017-01-10 LAB — LIPASE, BLOOD: LIPASE: 52 U/L — AB (ref 11–51)

## 2017-01-10 LAB — POC OCCULT BLOOD, ED: FECAL OCCULT BLD: NEGATIVE

## 2017-01-10 MED ORDER — SODIUM CHLORIDE 0.9 % IV SOLN
INTRAVENOUS | Status: DC
  Administered 2017-01-10: 15:00:00 via INTRAVENOUS

## 2017-01-10 MED ORDER — CIPROFLOXACIN HCL 250 MG PO TABS: 250.0000 mg | ORAL_TABLET | Freq: Every day | ORAL | 0 refills | Status: DC

## 2017-01-10 MED ORDER — METRONIDAZOLE 500 MG PO TABS: 500.0000 mg | ORAL_TABLET | Freq: Three times a day (TID) | ORAL | 0 refills | Status: DC

## 2017-01-10 NOTE — ED Notes (Signed)
Pt able to provide voided specimen

## 2017-01-10 NOTE — ED Triage Notes (Signed)
Rectal pain and constipation.  Seen PCP 2 weeks ago for the same and was told he had an anal fissure.  Pain is no better.   Per wife pt also has an unsteady gait times one week.  And also  c/o chest and nasal congestion.

## 2017-01-10 NOTE — ED Notes (Signed)
Attempted in/out cath with 65F.  Performed by V. Levada Dy NT Witness Angelina Pih RN Unsuccessful - Dr. Ree Kida

## 2017-01-10 NOTE — ED Notes (Signed)
Per Dr Tomi Bamberger- bolus fluids at this time

## 2017-01-10 NOTE — Discharge Instructions (Signed)
Take the medications as prescribed, follow up with your primary GI doctor for further evaluation, discuss possible colonoscopy or sigmoidoscopy for the persistent rectal pain

## 2017-01-10 NOTE — ED Provider Notes (Signed)
[facility] DEPT Provider Note   CSN: [id number] Arrival date & time: 01/10/17  1306     History   Chief Complaint Chief Complaint  Patient presents with  . Constipation  Weakness, cough, and abdominal bloating  HPI [name] [name] is a 83 y.o. male.  HPI Patient presents to the emergency room for evaluation of several complaints. The history is primarily provided by the patient's wife. Patient is able to answer questions however the wife states he has dementia.  The patient tells me that he has no complaints right now. He denies any abdominal pain. Wife states however he has been having episodes on at least for the last couple of weeks. He gets intermittent abdominal bloating and decreased appetite. He also seems to be losing weight. Previously he was seen for pain in the rectal area several months ago. He was diagnosed with prostatitis and treated with antibiotics. His symptoms did not improve with that. He followed up with his urologist to felt that it was no longer related to his prostate. He then saw his primary doctor and was told it was an anal fissure. This has not gotten any better over the last couple weeks. He continues to have pain when he sits. He has intermittent constipation although it does respond to medications and he had a bowel movement last evening. He has not had any vomiting but he is not eating well. She also mentions a bit of a cough. He also seems to be having increasing weakness and difficult for her to stand. He has not had any speech issues. No focal neurologic deficits. Past Medical History:  Diagnosis Date  . Alzheimer disease   . Anemia   . BPH (benign prostatic hyperplasia)   . CAD (coronary artery disease)    s/p CABG in 1997 with known atretic LIMA to LAD and 40% LM.    Marland Kitchen Carotid artery stenosis    s/p left CEA.> 20 yrs ago and > 70% right ICA stenosis - followed by Dr. Donnetta Hutching  . Chronic chest wall pain   . Chronic kidney disease (CKD), stage III  (moderate)   . COPD (chronic obstructive pulmonary disease) ([place])   . Cough    cough with productive white to yellow sputum.  . DEMENTIA    "short term memory issues"  . Depression with anxiety   . GERD (gastroesophageal reflux disease)   . Glaucoma    bilateral  . Hyperlipemia   . Hypertension   . Memory loss   . Monoclonal gammopathy   . Neuromuscular disorder (HCC)    fingertips numbness"  . On home oxygen therapy    at bedtime-2 L/m nasally..    . Peptic ulcer disease   . Prostatitis   . PVD (peripheral vascular disease) ([place])    s/p left renal artery stent and left iliac stent  . Retinal artery occlusion     Patient Active Problem List   Diagnosis Date Noted  . Dysphagia 12/13/2015  . Anorexia 12/13/2015  . CAP (community acquired pneumonia) 12/13/2015  . History of thrush 12/13/2015  . Hypertension, essential 12/13/2015  . Dementia 12/13/2015  . Upper airway cough syndrome 09/01/2015  . COPD GOLD II 04/26/2015  . Chronic respiratory failure with hypoxia ([place]) 02/25/2015  . Carotid stenosis, asymptomatic 01/25/2015  . Delirium 10/15/2014  . Chronic chest wall pain   . Paresthesia 12/22/2013  . PVD (peripheral vascular disease) ([place])   . GERD (gastroesophageal reflux disease)   . CAD (coronary artery disease)   .  Alzheimer disease   . Memory loss   . PAIN IN JOINT, MULTIPLE SITES 04/19/2009  . Allergic rhinitis 08/16/2008  . Dyslipidemia 03/26/2007  . MONOCLONAL GAMMOPATHY 03/26/2007  . CKD (chronic kidney disease) stage 3, GFR 30-59 ml/min 03/26/2007    Past Surgical History:  Procedure Laterality Date  . BACK SURGERY  2011   lower back -disc  . CARDIAC SURGERY    . CATARACT EXTRACTION W/PHACO  09/18/2011   Procedure: CATARACT EXTRACTION PHACO AND INTRAOCULAR LENS PLACEMENT (IOC);  Surgeon: Elta Guadeloupe T. Gershon Crane, MD;  Location: [facility];  Service: Ophthalmology;  Laterality: Right;  CDE: 6.19  . COLONOSCOPY WITH PROPOFOL N/A 07/11/2015   Procedure: COLONOSCOPY  WITH PROPOFOL;  Surgeon: Laurence Spates, MD;  Location: [facility];  Service: Endoscopy;  Laterality: N/A;  . CORONARY ARTERY BYPASS GRAFT  1997   "nerve pain at area"  . ESOPHAGOGASTRODUODENOSCOPY (EGD) WITH PROPOFOL N/A 07/11/2015   Procedure: ESOPHAGOGASTRODUODENOSCOPY (EGD) WITH PROPOFOL;  Surgeon: Laurence Spates, MD;  Location: [facility];  Service: Endoscopy;  Laterality: N/A;  . Left CEA  2002   carotid stent-left-   . RENAL ARTERY STENT         Home Medications    Prior to Admission medications   Medication Sig Start Date End Date Taking? Authorizing Provider  acetaminophen (TYLENOL) 500 MG tablet Take 1,000 mg by mouth every 6 (six) hours as needed for headache.   Yes [provider]  aspirin 81 MG tablet Take 1 tablet (81 mg total) by mouth daily. 03/12/16  Yes Turner, Eber Hong, MD  calcitRIOL (ROCALTROL) 0.5 MCG capsule Take 0.5 mcg by mouth daily. 07/29/14  Yes [provider]  Cholecalciferol (VITAMIN D) 2000 units CAPS Take 1 capsule by mouth every other day.   Yes [provider]  ezetimibe (ZETIA) 10 MG tablet Take 1 tablet (10 mg total) by mouth daily. 10/23/16  Yes Turner, Eber Hong, MD  feeding supplement, ENSURE ENLIVE, (ENSURE ENLIVE) LIQD Take 237 mLs by mouth 2 (two) times daily between meals. 10/16/14  Yes Dhungel, Nishant, MD  fenofibrate 160 MG tablet Take 1 tablet (160 mg total) by mouth daily. 10/23/16  Yes Turner, Eber Hong, MD  ferrous sulfate 325 (65 FE) MG tablet Take 325 mg by mouth every morning.    Yes [provider]  finasteride (PROSCAR) 5 MG tablet Take 5 mg by mouth every morning.    Yes [provider]  fluticasone (FLONASE) 50 MCG/ACT nasal spray Place 2 sprays into both nostrils daily as needed for allergies.    Yes [provider]  gabapentin (NEURONTIN) 300 MG capsule Take 1 capsule (300 mg total) by mouth 3 (three) times daily. Patient taking differently: Take 300 mg by mouth every morning.  05/02/15   Yes Dennie Bible, NP  ipratropium (ATROVENT) 0.06 % nasal spray Place 1 spray into both nostrils 4 (four) times daily.   Yes [provider]  ipratropium-albuterol (DUONEB) 0.5-2.5 (3) MG/3ML SOLN Take 3 mLs by nebulization every 4 (four) hours as needed. 02/02/16  Yes Tanda Rockers, MD  lansoprazole (PREVACID) 30 MG capsule Take 30 mg by mouth daily.    Yes [provider]  latanoprost (XALATAN) 0.005 % ophthalmic solution Place 1 drop into both eyes at bedtime.  12/28/13  Yes [provider]  lubiprostone (AMITIZA) 24 MCG capsule Take 24 mcg by mouth daily as needed for constipation.    Yes [provider]  memantine (NAMENDA) 10 MG tablet Take 1  tablet (10 mg total) by mouth 2 (two) times daily. 10/18/16  Yes Marcial Pacas, MD  metoCLOPramide (REGLAN) 10 MG tablet Take 10 mg by mouth 2 (two) times daily. Reported on 09/30/2015 11/01/14  Yes [provider]  metoprolol succinate (TOPROL-XL) 25 MG 24 hr tablet Take 1 tablet (25 mg total) by mouth daily. 09/12/16  Yes Turner, Eber Hong, MD  Multiple Vitamin (MULTIVITAMIN WITH MINERALS) TABS tablet Take 1 tablet by mouth daily.   Yes [provider]  nitroGLYCERIN (NITROSTAT) 0.4 MG SL tablet Place 1 tablet (0.4 mg total) under the tongue every 5 (five) minutes as needed for chest pain. 12/15/15  Yes Turner, Eber Hong, MD  OXYGEN Inhale 2 L into the lungs at bedtime. 2lpm with sleep   Yes [provider]  polyethylene glycol (MIRALAX / GLYCOLAX) packet Take 17 g by mouth daily as needed for moderate constipation.    Yes [provider]  PROCTO-MED HC 2.5 % rectal cream Place 1 application rectally 2 (two) times daily. 12/24/16  Yes [provider]  rosuvastatin (CRESTOR) 40 MG tablet Take 1 tablet (40 mg total) by mouth daily. 10/23/16 01/21/17 Yes Turner, Eber Hong, MD  Tamsulosin HCl (FLOMAX) 0.4 MG CAPS Take 0.8 mg by mouth at bedtime.    Yes [provider]  vitamin B-12  (CYANOCOBALAMIN) 1000 MCG tablet Take 1,000 mcg by mouth daily.   Yes [provider]  ciprofloxacin (CIPRO) 250 MG tablet Take 1 tablet (250 mg total) by mouth daily with breakfast. 01/10/17   Dorie Rank, MD  metroNIDAZOLE (FLAGYL) 500 MG tablet Take 1 tablet (500 mg total) by mouth 3 (three) times daily. 01/10/17   Dorie Rank, MD    Family History Family History  Problem Relation Age of Onset  . Hypertension Father   . Hypertension Mother   . High blood pressure Unknown   . High Cholesterol Unknown   . Anesthesia problems Neg Hx   . Hypotension Neg Hx   . Malignant hyperthermia Neg Hx   . Pseudochol deficiency Neg Hx     Social History Social History  Substance Use Topics  . Smoking status: Former Smoker    Packs/day: 0.50    Years: 40.00    Types: Cigarettes    Quit date: 07/23/1978  . Smokeless tobacco: Never Used  . Alcohol use No     Allergies   Patient has no known allergies.   Review of Systems Review of Systems  All other systems reviewed and are negative.    Physical Exam Updated Vital Signs BP 125/64   Pulse (!) 51   Temp 97.5 F (36.4 C) (Oral)   Resp (!) 21   Ht 1.702 m (5\' 7" )   Wt 73.9 kg (163 lb)   SpO2 99%   BMI 25.53 kg/m   Physical Exam  Constitutional: No distress.  HENT:  Head: Normocephalic and atraumatic.  Right Ear: External ear normal.  Left Ear: External ear normal.  Eyes: Conjunctivae are normal. Right eye exhibits no discharge. Left eye exhibits no discharge. No scleral icterus.  Neck: Neck supple. No tracheal deviation present.  Cardiovascular: Normal rate, regular rhythm and intact distal pulses.   Pulmonary/Chest: Effort normal and breath sounds normal. No stridor. No respiratory distress. He has no wheezes. He has no rales.  Abdominal: Soft. Bowel sounds are normal. He exhibits no distension and no mass. There is no rebound and no guarding.  Genitourinary:  Genitourinary Comments: ttp on rectal exam, no hemorrhoid,  no mass, no blood  Musculoskeletal: He exhibits no edema or tenderness.  Neurological: He is alert. He has normal strength. No cranial nerve deficit (no facial droop, extraocular movements intact, no slurred speech) or sensory deficit. He exhibits normal muscle tone. He displays no seizure activity. Coordination normal.  Skin: Skin is warm and dry. No rash noted.  Psychiatric: He has a normal mood and affect.  Nursing note and vitals reviewed.    ED Treatments / Results  Labs (all labs ordered are listed, but only abnormal results are displayed) Labs Reviewed  COMPREHENSIVE METABOLIC PANEL - Abnormal; Notable for the following:       Result Value   BUN 25 (*)    Creatinine, Ser 2.47 (*)    ALT 16 (*)    Alkaline Phosphatase 28 (*)    GFR calc non Af Amer 23 (*)    GFR calc Af Amer 26 (*)    All other components within normal limits  LIPASE, BLOOD - Abnormal; Notable for the following:    Lipase 52 (*)    All other components within normal limits  CBC WITH DIFFERENTIAL/PLATELET - Abnormal; Notable for the following:    RBC 3.52 (*)    Hemoglobin 11.5 (*)    HCT 34.6 (*)    All other components within normal limits  URINALYSIS, ROUTINE W REFLEX MICROSCOPIC - Abnormal; Notable for the following:    Hgb urine dipstick SMALL (*)    Squamous Epithelial / LPF 0-5 (*)    All other components within normal limits  POC OCCULT BLOOD, ED     Radiology Ct Abdomen Pelvis Wo Contrast  Result Date: 01/10/2017 CLINICAL DATA:  Rectal pain, constipation, anal fissure, unsteady gait for 1 week, chest and nasal congestion, history of peripheral vascular disease post LEFT renal artery stenting and LEFT iliac artery stenting, hypertension, GERD, COPD, coronary artery disease post CABG, Alzheimer's, former smoker EXAM: CT ABDOMEN AND PELVIS WITHOUT CONTRAST TECHNIQUE: Multidetector CT imaging of the abdomen and pelvis was performed following the standard protocol without IV contrast. Sagittal and  coronal MPR images reconstructed from axial data set. Patient drank dilute oral contrast for exam. COMPARISON:  06/08/2016 FINDINGS: Lower chest: Severe emphysematous changes with interstitial disease and fibrosis at the lung bases. Hepatobiliary: Contracted gallbladder.  Liver unremarkable. Pancreas: Normal appearance Spleen: Normal appearance Adrenals/Urinary Tract: Adrenal glands normal appearance. Multiple LEFT renal cysts largest inferior pole 5.1 x 4.6 cm image 44. Mildly prominent ureters without obstructing calculi. No additional renal masses or hydronephrosis. Posterolateral RIGHT bladder diverticulum. Stomach/Bowel: Question mild anorectal wall thickening versus artifact from underdistention. No significant perirectal or perianal infiltrative changes. Normal appendix. Mild diverticulosis of descending and proximal sigmoid colon without evidence diverticulitis. Small hiatal hernia. Stomach and remaining bowel loops otherwise unremarkable. Vascular/Lymphatic: Coronary arterial calcification post CABG. Atherosclerotic calcification aorta. Stents at LEFT renal and BILATERAL common iliac arteries. 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 No adenopathy. Reproductive: Mild prostatic enlargement, gland measuring 4.4 x 3.9 cm image 84 Other: Small BILATERAL inguinal hernias containing fat. No free air free fluid. Musculoskeletal: Degenerative disc disease changes L5-S1. Mild degenerative changes of both hips greater on LEFT. No acute osseous findings. IMPRESSION: Multiple LEFT renal cysts. Mild anorectal wall thickening versus artifact from incomplete distention ; mass and inflammatory changes/proctitis not excluded, though the very rectal or perianal infiltrative changes are identified; recommend correlation with physical exam. Distal colonic diverticulosis. Mild prostatic enlargement. BILATERAL inguinal hernias containing fat. Emphysematous changes with bibasilar  pulmonary fibrosis.  Extensive vascular disease changes including coronary arterial calcification post CABG and BILATERAL common iliac artery stenting. Aortic Atherosclerosis (ICD10-I70.0) and Emphysema (ICD10-J43.9). Electronically Signed   By: Lavonia Dana M.D.   On: 01/10/2017 19:53   Dg Chest 2 View  Result Date: 01/10/2017 CLINICAL DATA:  Cough and congestion with abdominal pain EXAM: CHEST  2 VIEW COMPARISON:  11/20/2016 FINDINGS: Cardiac shadow is stable. Postoperative changes are again seen. Chronic fibrotic changes are noted throughout the right lung and to a lesser degree in the left lung stable from the prior exam. No new focal abnormality is seen. No bony abnormality is noted. IMPRESSION: Chronic changes without acute abnormality. Electronically Signed   By: Inez Catalina M.D.   On: 01/10/2017 15:36    Procedures Procedures (including critical care time)  Medications Ordered in ED Medications  0.9 %  sodium chloride infusion ( Intravenous New Bag/Given 01/10/17 1444)     Initial Impression / Assessment and Plan / ED Course  I have reviewed the triage vital signs and the nursing notes.  Pertinent labs & imaging results that were available during my care of the patient were reviewed by me and considered in my medical decision making (see chart for details).  Clinical Course as of Jan 10 2041  Thu Jan 10, 2017  2040 Pt is feeling better after fluids.  Alert and awake.  Answering questions  [JK]    Clinical Course User Index [JK] Dorie Rank, MD  Labs are stable compared to previous values.  CT scan shows questionable inflammation in the rectum.  Will rx abx for possible proctitis although likely would benefit from GI eval and possible sigmoidoscopy.  Discussed findings with patient and wife.  They are comfortable with discharge   Final Clinical Impressions(s) / ED Diagnoses   Final diagnoses:  Proctitis    New Prescriptions New Prescriptions   CIPROFLOXACIN (CIPRO) 250 MG TABLET    Take 1 tablet  (250 mg total) by mouth daily with breakfast.   METRONIDAZOLE (FLAGYL) 500 MG TABLET    Take 1 tablet (500 mg total) by mouth 3 (three) times daily.     Dorie Rank, MD 01/10/17 2042

## 2017-01-10 NOTE — ED Notes (Signed)
Pt.'s wife currently trying to get urine sample by assisting pt with urinal

## 2017-01-10 NOTE — ED Notes (Signed)
ED Provider at bedside. 

## 2017-01-10 NOTE — ED Notes (Signed)
Patient transported to CT 

## 2017-01-10 NOTE — ED Notes (Signed)
Pt to xray

## 2017-01-18 DIAGNOSIS — K5900 Constipation, unspecified: Secondary | ICD-10-CM | POA: Diagnosis not present

## 2017-01-18 DIAGNOSIS — K602 Anal fissure, unspecified: Secondary | ICD-10-CM | POA: Diagnosis not present

## 2017-01-18 DIAGNOSIS — R1313 Dysphagia, pharyngeal phase: Secondary | ICD-10-CM | POA: Diagnosis not present

## 2017-01-21 ENCOUNTER — Other Ambulatory Visit: Payer: Self-pay

## 2017-01-21 NOTE — Patient Outreach (Signed)
[facility]) Care Management  [place]  01/21/2017   [name] [name] 1930-11-08 [id number]  Subjective: Telephone call to spouse for monthly call. She reports patient has had some problems with rectal pain and has a prescription for a cream that needs to be mixed.  She reports that the patient has had some increased problems with coughing that is frothy.  Discussed with wife that patient is probably not swallowing as well as he should due to alzheimer's.  She verbalized understanding and states that she uses the thickener in his liquids.  Discussed with wife COPD and when to notify physician.  She verbalized understanding.     Objective:   Encounter Medications:  Outpatient Encounter Prescriptions as of 01/21/2017  Medication Sig  . acetaminophen (TYLENOL) 500 MG tablet Take 1,000 mg by mouth every 6 (six) hours as needed for headache.  Marland Kitchen aspirin 81 MG tablet Take 1 tablet (81 mg total) by mouth daily.  . calcitRIOL (ROCALTROL) 0.5 MCG capsule Take 0.5 mcg by mouth daily.  . Cholecalciferol (VITAMIN D) 2000 units CAPS Take 1 capsule by mouth every other day.  . ciprofloxacin (CIPRO) 250 MG tablet Take 1 tablet (250 mg total) by mouth daily with breakfast.  . ezetimibe (ZETIA) 10 MG tablet Take 1 tablet (10 mg total) by mouth daily.  . feeding supplement, ENSURE ENLIVE, (ENSURE ENLIVE) LIQD Take 237 mLs by mouth 2 (two) times daily between meals.  . fenofibrate 160 MG tablet Take 1 tablet (160 mg total) by mouth daily.  . ferrous sulfate 325 (65 FE) MG tablet Take 325 mg by mouth every morning.   . finasteride (PROSCAR) 5 MG tablet Take 5 mg by mouth every morning.   . fluticasone (FLONASE) 50 MCG/ACT nasal spray Place 2 sprays into both nostrils daily as needed for allergies.   Marland Kitchen gabapentin (NEURONTIN) 300 MG capsule Take 1 capsule (300 mg total) by mouth 3 (three) times daily. (Patient taking differently: Take 300 mg by mouth every morning. )  . ipratropium (ATROVENT)  0.06 % nasal spray Place 1 spray into both nostrils 4 (four) times daily.  Marland Kitchen ipratropium-albuterol (DUONEB) 0.5-2.5 (3) MG/3ML SOLN Take 3 mLs by nebulization every 4 (four) hours as needed.  . lansoprazole (PREVACID) 30 MG capsule Take 30 mg by mouth daily.   Marland Kitchen latanoprost (XALATAN) 0.005 % ophthalmic solution Place 1 drop into both eyes at bedtime.   Marland Kitchen lubiprostone (AMITIZA) 24 MCG capsule Take 24 mcg by mouth daily as needed for constipation.   . memantine (NAMENDA) 10 MG tablet Take 1 tablet (10 mg total) by mouth 2 (two) times daily.  . metoCLOPramide (REGLAN) 10 MG tablet Take 10 mg by mouth 2 (two) times daily. Reported on 09/30/2015  . metoprolol succinate (TOPROL-XL) 25 MG 24 hr tablet Take 1 tablet (25 mg total) by mouth daily.  . metroNIDAZOLE (FLAGYL) 500 MG tablet Take 1 tablet (500 mg total) by mouth 3 (three) times daily.  . Multiple Vitamin (MULTIVITAMIN WITH MINERALS) TABS tablet Take 1 tablet by mouth daily.  . nitroGLYCERIN (NITROSTAT) 0.4 MG SL tablet Place 1 tablet (0.4 mg total) under the tongue every 5 (five) minutes as needed for chest pain.  . OXYGEN Inhale 2 L into the lungs at bedtime. 2lpm with sleep  . polyethylene glycol (MIRALAX / GLYCOLAX) packet Take 17 g by mouth daily as needed for moderate constipation.   Marland Kitchen PROCTO-MED HC 2.5 % rectal cream Place 1 application rectally 2 (two) times daily.  Marland Kitchen  rosuvastatin (CRESTOR) 40 MG tablet Take 1 tablet (40 mg total) by mouth daily.  . Tamsulosin HCl (FLOMAX) 0.4 MG CAPS Take 0.8 mg by mouth at bedtime.   . vitamin B-12 (CYANOCOBALAMIN) 1000 MCG tablet Take 1,000 mcg by mouth daily.   No facility-administered encounter medications on file as of 01/21/2017.     Functional Status:  In your present state of health, do you have any difficulty performing the following activities: 02/28/2016 01/26/2016  Hearing? N -  Vision? Y -  Difficulty concentrating or making decisions? Y -  Walking or climbing stairs? Y -  Dressing or  bathing? Y -  Doing errands, shopping? Y -  Conservation officer, nature and eating ? Y -  Using the Toilet? N -  In the past six months, have you accidently leaked urine? Y -  Do you have problems with loss of bowel control? Y (No Data)  Managing your Medications? Y -  Managing your Finances? Y -  Housekeeping or managing your Housekeeping? Y -  Some recent data might be hidden    Fall/Depression Screening: Fall Risk  01/21/2017 12/25/2016 11/30/2016  Falls in the past year? No No No  Risk for fall due to : - - -  Risk for fall due to (comments): - - -   PHQ 2/9 Scores 01/21/2017 12/25/2016 11/30/2016 08/30/2016 08/06/2016 06/22/2016 05/24/2016  PHQ - 2 Score 0 0 0 0 0 0 0  Exception Documentation - - - - - - -    Assessment: Patient continues to benefit from health coach outreach for disease management and support.    Plan:  [facility] CM Care Plan Problem One     Most Recent Value  Care Plan Problem One  COPD knowledge deficit  Role Documenting the Problem One  Health Coach  Care Plan for Problem One  Active  [facility] Long Term Goal   Caregiver will be able to verbalize action plan for COPD exacerbation in the next 90 days  [facility] Long Term Goal Start Date  01/21/17 Barrie Folk continued]  Interventions for Problem One Long Term Goal  RN Health Coach reiterated with caregiver signs and symptoms of COPD and when to notify physician.      [facility] CM Care Plan Problem Two     Most Recent Value  Care Plan Problem Two  Impaired Swallowing  Role Documenting the Problem Two  [place] for Problem Two  Active  Interventions for Problem Two Long Term Goal   RN Health Coach reiterated with caregiver chin tuck method when patient is swallowing and not using a straw for drinking.  Also reviewed signs and aspiration pneumonia and when to notify physician.    [facility] Long Term Goal  Caregiver will verbalize utilizing interventions to minimize aspiration within the next 60 days.  [facility] Long Term Goal Start Date  01/21/17 [goal  continued]    [facility] CM Care Plan Problem Three     Most Recent Value  Care Plan Problem Three  Alzheimer's Disease  Role Documenting the Problem [place] for Problem Three  Active  [facility] Long Term Goal   Caregiver will verbalize understanding of alzheimer disease process and care within 90 days.    [facility] Long Term Goal Start Date  01/21/17 [goal continued]  Interventions for Problem Three Long Term Goal  RN Health Coach reiterated with caregiver wife alzheimer's disease and symptoms of decline.      RN Health Coach will contact  patient in the month of August and patient agrees to next outreach.  [name], RN, MSN [place] [phone]

## 2017-01-22 ENCOUNTER — Encounter (HOSPITAL_COMMUNITY): Payer: Medicare HMO

## 2017-01-22 ENCOUNTER — Encounter: Payer: Medicare HMO | Admitting: Vascular Surgery

## 2017-01-29 ENCOUNTER — Other Ambulatory Visit: Payer: Self-pay | Admitting: Cardiology

## 2017-02-01 DIAGNOSIS — I10 Essential (primary) hypertension: Secondary | ICD-10-CM | POA: Diagnosis not present

## 2017-02-01 DIAGNOSIS — I739 Peripheral vascular disease, unspecified: Secondary | ICD-10-CM | POA: Diagnosis not present

## 2017-02-01 DIAGNOSIS — R0600 Dyspnea, unspecified: Secondary | ICD-10-CM | POA: Diagnosis not present

## 2017-02-01 DIAGNOSIS — J441 Chronic obstructive pulmonary disease with (acute) exacerbation: Secondary | ICD-10-CM | POA: Diagnosis not present

## 2017-02-12 ENCOUNTER — Encounter: Payer: Self-pay | Admitting: Vascular Surgery

## 2017-02-15 ENCOUNTER — Telehealth: Payer: Self-pay | Admitting: *Deleted

## 2017-02-15 MED ORDER — GABAPENTIN 300 MG PO CAPS: 300.0000 mg | ORAL_CAPSULE | Freq: Three times a day (TID) | ORAL | 3 refills | Status: DC

## 2017-02-15 NOTE — Telephone Encounter (Signed)
Gabapentin escribed to [employer] per faxed request/fim 

## 2017-02-19 DIAGNOSIS — K602 Anal fissure, unspecified: Secondary | ICD-10-CM | POA: Diagnosis not present

## 2017-02-19 DIAGNOSIS — R630 Anorexia: Secondary | ICD-10-CM | POA: Diagnosis not present

## 2017-02-19 DIAGNOSIS — J449 Chronic obstructive pulmonary disease, unspecified: Secondary | ICD-10-CM | POA: Diagnosis not present

## 2017-02-19 DIAGNOSIS — I2510 Atherosclerotic heart disease of native coronary artery without angina pectoris: Secondary | ICD-10-CM | POA: Diagnosis not present

## 2017-02-19 DIAGNOSIS — F0390 Unspecified dementia without behavioral disturbance: Secondary | ICD-10-CM | POA: Diagnosis not present

## 2017-02-19 DIAGNOSIS — K219 Gastro-esophageal reflux disease without esophagitis: Secondary | ICD-10-CM | POA: Diagnosis not present

## 2017-02-19 DIAGNOSIS — K5900 Constipation, unspecified: Secondary | ICD-10-CM | POA: Diagnosis not present

## 2017-02-19 DIAGNOSIS — K3184 Gastroparesis: Secondary | ICD-10-CM | POA: Diagnosis not present

## 2017-02-20 ENCOUNTER — Ambulatory Visit: Payer: Self-pay

## 2017-02-21 ENCOUNTER — Other Ambulatory Visit: Payer: Self-pay

## 2017-02-21 NOTE — Patient Outreach (Signed)
[facility]) Care Management  [place]  02/21/2017   [name] [name] August 21, 1930 [id number]  Subjective: Telephone call to caregiver for monthly call. Spoke with wife she reports patient is doing ok.  She reports that he has had some problems with questionable confusion.   Discussed with her alzheimer's/dementia progression. She verbalized understanding.  She reports that his rectal pain is better after being checked by the physician.  She reports that he continues to have some problems with swallowing due to his alzheimer's.  Discussed with her his swallowing and how straws are not recommended.  She verbalized understanding. Discussed with wife COPD and when to notify physician.  She verbalized understanding.    Objective:   Encounter Medications:  Outpatient Encounter Prescriptions as of 02/21/2017  Medication Sig  . acetaminophen (TYLENOL) 500 MG tablet Take 1,000 mg by mouth every 6 (six) hours as needed for headache.  Marland Kitchen aspirin 81 MG tablet Take 1 tablet (81 mg total) by mouth daily.  . calcitRIOL (ROCALTROL) 0.5 MCG capsule Take 0.5 mcg by mouth daily.  . Cholecalciferol (VITAMIN D) 2000 units CAPS Take 1 capsule by mouth every other day.  . ciprofloxacin (CIPRO) 250 MG tablet Take 1 tablet (250 mg total) by mouth daily with breakfast.  . ezetimibe (ZETIA) 10 MG tablet Take 1 tablet (10 mg total) by mouth daily.  . feeding supplement, ENSURE ENLIVE, (ENSURE ENLIVE) LIQD Take 237 mLs by mouth 2 (two) times daily between meals.  . fenofibrate 160 MG tablet Take 1 tablet (160 mg total) by mouth daily.  . ferrous sulfate 325 (65 FE) MG tablet Take 325 mg by mouth every morning.   . finasteride (PROSCAR) 5 MG tablet Take 5 mg by mouth every morning.   . fluticasone (FLONASE) 50 MCG/ACT nasal spray Place 2 sprays into both nostrils daily as needed for allergies.   Marland Kitchen gabapentin (NEURONTIN) 300 MG capsule Take 1 capsule (300 mg total) by mouth 3 (three) times daily.  Marland Kitchen  ipratropium (ATROVENT) 0.06 % nasal spray Place 1 spray into both nostrils 4 (four) times daily.  Marland Kitchen ipratropium-albuterol (DUONEB) 0.5-2.5 (3) MG/3ML SOLN Take 3 mLs by nebulization every 4 (four) hours as needed.  . lansoprazole (PREVACID) 30 MG capsule Take 30 mg by mouth daily.   Marland Kitchen latanoprost (XALATAN) 0.005 % ophthalmic solution Place 1 drop into both eyes at bedtime.   Marland Kitchen lubiprostone (AMITIZA) 24 MCG capsule Take 24 mcg by mouth daily as needed for constipation.   . memantine (NAMENDA) 10 MG tablet Take 1 tablet (10 mg total) by mouth 2 (two) times daily.  . metoCLOPramide (REGLAN) 10 MG tablet Take 10 mg by mouth 2 (two) times daily. Reported on 09/30/2015  . metoprolol succinate (TOPROL-XL) 25 MG 24 hr tablet TAKE 1 TABLET EVERY DAY  . Multiple Vitamin (MULTIVITAMIN WITH MINERALS) TABS tablet Take 1 tablet by mouth daily.  . nitroGLYCERIN (NITROSTAT) 0.4 MG SL tablet Place 1 tablet (0.4 mg total) under the tongue every 5 (five) minutes as needed for chest pain.  . OXYGEN Inhale 2 L into the lungs at bedtime. 2lpm with sleep  . polyethylene glycol (MIRALAX / GLYCOLAX) packet Take 17 g by mouth daily as needed for moderate constipation.   Marland Kitchen PROCTO-MED HC 2.5 % rectal cream Place 1 application rectally 2 (two) times daily.  . Tamsulosin HCl (FLOMAX) 0.4 MG CAPS Take 0.8 mg by mouth at bedtime.   . vitamin B-12 (CYANOCOBALAMIN) 1000 MCG tablet Take 1,000 mcg by  mouth daily.  . metroNIDAZOLE (FLAGYL) 500 MG tablet Take 1 tablet (500 mg total) by mouth 3 (three) times daily. (Patient not taking: Reported on 02/21/2017)  . rosuvastatin (CRESTOR) 40 MG tablet Take 1 tablet (40 mg total) by mouth daily.   No facility-administered encounter medications on file as of 02/21/2017.     Functional Status:  In your present state of health, do you have any difficulty performing the following activities: 02/28/2016  Hearing? N  Vision? Y  Difficulty concentrating or making decisions? Y  Walking or climbing  stairs? Y  Dressing or bathing? Y  Doing errands, shopping? Y  Preparing Food and eating ? Y  Using the Toilet? N  In the past six months, have you accidently leaked urine? Y  Do you have problems with loss of bowel control? Y  Managing your Medications? Y  Managing your Finances? Y  Housekeeping or managing your Housekeeping? Y  Some recent data might be hidden    Fall/Depression Screening: Fall Risk  02/21/2017 01/21/2017 12/25/2016  Falls in the past year? No No No  Risk for fall due to : - - -  Risk for fall due to: Comment - - -   PHQ 2/9 Scores 02/21/2017 01/21/2017 12/25/2016 11/30/2016 08/30/2016 08/06/2016 06/22/2016  PHQ - 2 Score 0 0 0 0 0 0 0  Exception Documentation - - - - - - -    Assessment: Patient continues to benefit from health coach outreach for disease management and support.    Plan:  [facility] CM Care Plan Problem One     Most Recent Value  Care Plan Problem One  COPD knowledge deficit  Role Documenting the Problem One  Health Coach  Care Plan for Problem One  Active  [facility] Long Term Goal   Caregiver will be able to verbalize action plan for COPD exacerbation in the next 90 days  [facility] Long Term Goal Start Date  02/21/17 [goal continued]  Interventions for Problem One Long Term Goal  RN Health Coach reviewed with caregiver signs and symptoms of COPD and when to notify physician.      [facility] CM Care Plan Problem Two     Most Recent Value  Care Plan Problem Two  Impaired Swallowing  Role Documenting the Problem Two  [place] for Problem Two  Active  Interventions for Problem Two Long Term Goal   RN Health Coach reviewed with caregiver chin tuck method when patient is swallowing and not using a straw for drinking.  Also reviewed signs and aspiration pneumonia and when to notify physician.    [facility] Long Term Goal  Caregiver will verbalize utilizing interventions to minimize aspiration within the next 60 days.  [facility] Long Term Goal Start Date  02/21/17 [goal continued]     [facility] CM Care Plan Problem Three     Most Recent Value  Care Plan Problem Three  Alzheimer's Disease  Role Documenting the Problem [place] for Problem Three  Active  [facility] Long Term Goal   Caregiver will verbalize understanding of alzheimer disease process and care within 90 days.    [facility] Long Term Goal Start Date  02/21/17 [goal continued]  Interventions for Problem Three Long Term Goal  RN Health Coach reviewed with caregiver wife alzheimer's disease and symptoms of decline.      RN Health Coach will contact patient in the month of September and patient agrees to next outreach.  Allysson Rinehimer Durwin Reges,  RN, MSN [place] [phone]

## 2017-02-27 ENCOUNTER — Encounter: Payer: Self-pay | Admitting: Vascular Surgery

## 2017-02-27 ENCOUNTER — Ambulatory Visit (HOSPITAL_COMMUNITY)
Admission: RE | Admit: 2017-02-27 | Discharge: 2017-02-27 | Disposition: A | Discharge status: Home / Self Care | Payer: Medicare HMO | Source: Ambulatory Visit | Attending: Vascular Surgery | Admitting: Vascular Surgery

## 2017-02-27 ENCOUNTER — Ambulatory Visit (INDEPENDENT_AMBULATORY_CARE_PROVIDER_SITE_OTHER): Payer: Medicare HMO | Admitting: Vascular Surgery

## 2017-02-27 VITALS — BP 124/68 | HR 76 | Temp 97.1°F | Resp 18 | Ht 66.0 in | Wt 160.0 lb

## 2017-02-27 DIAGNOSIS — I6523 Occlusion and stenosis of bilateral carotid arteries: Secondary | ICD-10-CM | POA: Diagnosis not present

## 2017-02-27 DIAGNOSIS — I6521 Occlusion and stenosis of right carotid artery: Secondary | ICD-10-CM | POA: Diagnosis not present

## 2017-02-27 LAB — VAS US CAROTID
LCCADSYS: -89 cm/s
LEFT ECA DIAS: 0 cm/s
Left CCA dist dias: -12 cm/s
Left CCA prox dias: 12 cm/s
Left CCA prox sys: 94 cm/s
Left ICA dist dias: -23 cm/s
Left ICA dist sys: -81 cm/s
Left ICA prox dias: 19 cm/s
Left ICA prox sys: 106 cm/s
RCCADSYS: -72 cm/s
RCCAPDIAS: 15 cm/s
RIGHT CCA MID DIAS: 11 cm/s
RIGHT ECA DIAS: 0 cm/s
Right CCA prox sys: 114 cm/s

## 2017-02-27 NOTE — Progress Notes (Signed)
Vascular and Vein Specialist of   Patient name: [name] [name] MRN: [id number] DOB: 05-28-1931 Sex: male  REASON FOR VISIT: Follow-up carotid disease  HPI: [name] [name] is a 83 y.o. male here today for follow-up of known carotid disease. He is status post left carotid endarterectomy by Dr. Kellie Simmering in 2003. He has known moderate stenosis in his right internal carotid artery. He is seen today for duplex follow-up. He does have a relatively progressive dementia. His wife is with him and answers the majority of his questions for him. Denies any focal neurologic deficits. He does have some swallowing difficulty and what sounds like some aspiration difficulty as well  Past Medical History:  Diagnosis Date  . Alzheimer disease   . Anemia   . BPH (benign prostatic hyperplasia)   . CAD (coronary artery disease)    s/p CABG in 1997 with known atretic LIMA to LAD and 40% LM.    Marland Kitchen Carotid artery stenosis    s/p left CEA.> 20 yrs ago and > 70% right ICA stenosis - followed by Dr. Donnetta Hutching  . Chronic chest wall pain   . Chronic kidney disease (CKD), stage III (moderate)   . COPD (chronic obstructive pulmonary disease) ([place])   . Cough    cough with productive white to yellow sputum.  . DEMENTIA    "short term memory issues"  . Depression with anxiety   . GERD (gastroesophageal reflux disease)   . Glaucoma    bilateral  . Hyperlipemia   . Hypertension   . Memory loss   . Monoclonal gammopathy   . Neuromuscular disorder (HCC)    fingertips numbness"  . On home oxygen therapy    at bedtime-2 L/m nasally..    . Peptic ulcer disease   . Prostatitis   . PVD (peripheral vascular disease) ([place])    s/p left renal artery stent and left iliac stent  . Retinal artery occlusion     Family History  Problem Relation Age of Onset  . Hypertension Father   . Hypertension Mother   . High blood pressure Unknown   . High Cholesterol Unknown   . Anesthesia  problems Neg Hx   . Hypotension Neg Hx   . Malignant hyperthermia Neg Hx   . Pseudochol deficiency Neg Hx     SOCIAL HISTORY: Social History  Substance Use Topics  . Smoking status: Former Smoker    Packs/day: 0.50    Years: 40.00    Types: Cigarettes    Quit date: 07/23/1978  . Smokeless tobacco: Never Used  . Alcohol use No    No Known Allergies  Current Outpatient Prescriptions  Medication Sig Dispense Refill  . acetaminophen (TYLENOL) 500 MG tablet Take 1,000 mg by mouth every 6 (six) hours as needed for headache.    Marland Kitchen aspirin 81 MG tablet Take 1 tablet (81 mg total) by mouth daily.    . calcitRIOL (ROCALTROL) 0.5 MCG capsule Take 0.5 mcg by mouth daily.    . Cholecalciferol (VITAMIN D) 2000 units CAPS Take 1 capsule by mouth every other day.    . ezetimibe (ZETIA) 10 MG tablet Take 1 tablet (10 mg total) by mouth daily. 90 tablet 3  . feeding supplement, ENSURE ENLIVE, (ENSURE ENLIVE) LIQD Take 237 mLs by mouth 2 (two) times daily between meals. 30 Bottle 6  . fenofibrate 160 MG tablet Take 1 tablet (160 mg total) by mouth daily. 90 tablet 3  . ferrous sulfate 325 (65 FE)  MG tablet Take 325 mg by mouth every morning.     . finasteride (PROSCAR) 5 MG tablet Take 5 mg by mouth every morning.     . fluticasone (FLONASE) 50 MCG/ACT nasal spray Place 2 sprays into both nostrils daily as needed for allergies.     Marland Kitchen gabapentin (NEURONTIN) 300 MG capsule Take 1 capsule (300 mg total) by mouth 3 (three) times daily. 270 capsule 3  . ipratropium (ATROVENT) 0.06 % nasal spray Place 1 spray into both nostrils 4 (four) times daily.    Marland Kitchen ipratropium-albuterol (DUONEB) 0.5-2.5 (3) MG/3ML SOLN Take 3 mLs by nebulization every 4 (four) hours as needed. 360 mL 11  . lansoprazole (PREVACID) 30 MG capsule Take 30 mg by mouth daily.     Marland Kitchen latanoprost (XALATAN) 0.005 % ophthalmic solution Place 1 drop into both eyes at bedtime.     Marland Kitchen lubiprostone (AMITIZA) 24 MCG capsule Take 24 mcg by mouth daily  as needed for constipation.     . memantine (NAMENDA) 10 MG tablet Take 1 tablet (10 mg total) by mouth 2 (two) times daily. 60 tablet 11  . metoCLOPramide (REGLAN) 10 MG tablet Take 10 mg by mouth 2 (two) times daily. Reported on 09/30/2015    . metroNIDAZOLE (FLAGYL) 500 MG tablet Take 1 tablet (500 mg total) by mouth 3 (three) times daily. 21 tablet 0  . Multiple Vitamin (MULTIVITAMIN WITH MINERALS) TABS tablet Take 1 tablet by mouth daily.    . nitroGLYCERIN (NITROSTAT) 0.4 MG SL tablet Place 1 tablet (0.4 mg total) under the tongue every 5 (five) minutes as needed for chest pain. 25 tablet 11  . OXYGEN Inhale 2 L into the lungs at bedtime. 2lpm with sleep    . polyethylene glycol (MIRALAX / GLYCOLAX) packet Take 17 g by mouth daily as needed for moderate constipation.     Marland Kitchen PROCTO-MED HC 2.5 % rectal cream Place 1 application rectally 2 (two) times daily.    . Tamsulosin HCl (FLOMAX) 0.4 MG CAPS Take 0.8 mg by mouth at bedtime.     . vitamin B-12 (CYANOCOBALAMIN) 1000 MCG tablet Take 1,000 mcg by mouth daily.    . ciprofloxacin (CIPRO) 250 MG tablet Take 1 tablet (250 mg total) by mouth daily with breakfast. (Patient not taking: Reported on 02/27/2017) 14 tablet 0  . metoprolol succinate (TOPROL-XL) 25 MG 24 hr tablet TAKE 1 TABLET EVERY DAY (Patient not taking: Reported on 02/27/2017) 90 tablet 2  . rosuvastatin (CRESTOR) 40 MG tablet Take 1 tablet (40 mg total) by mouth daily. 90 tablet 3   No current facility-administered medications for this visit.     REVIEW OF SYSTEMS:  [X]  denotes positive finding, [ ]  denotes negative finding Cardiac  Comments:  Chest pain or chest pressure:    Shortness of breath upon exertion:    Short of breath when lying flat:    Irregular heart rhythm:        Vascular    Pain in calf, thigh, or hip brought on by ambulation:    Pain in feet at night that wakes you up from your sleep:     Blood clot in your veins:    Leg swelling:           PHYSICAL  EXAM: Vitals:   02/27/17 1343 02/27/17 1347  BP: 115/61 124/68  Pulse: 76 76  Resp: 18   Temp: (!) 97.1 F (36.2 C)   TempSrc: Axillary   SpO2: 96%   Weight:  160 lb (72.6 kg)   Height: 5\' 6"  (1.676 m)     GENERAL: The patient is a well-nourished male, in no acute distress. The vital signs are documented above. CARDIOVASCULAR: Well-healed left carotid incision with no bruits bilaterally. 2+ radial pulses bilaterally PULMONARY: There is good air exchange  MUSCULOSKELETAL: There are no major deformities or cyanosis. NEUROLOGIC: No focal weakness or paresthesias are detected. SKIN: There are no ulcers or rashes noted. PSYCHIATRIC: The patient has a normal affect.  DATA:  Carotid duplex today reveals widely patent endarterectomy on the left. The right carotid reveals no change in his prior level of stenosis in the 60-79% range.  MEDICAL ISSUES: Stable right asymptomatic moderate carotid stenosis. Will continue his usual activities. We'll see him again in one year for continued follow-up with repeat duplex ultrasound    Rosetta Posner, MD [facility] Vascular and Vein Specialists of [facility] Tel [phone] Pager [phone]

## 2017-03-04 DIAGNOSIS — R1310 Dysphagia, unspecified: Secondary | ICD-10-CM | POA: Diagnosis not present

## 2017-03-04 DIAGNOSIS — R0600 Dyspnea, unspecified: Secondary | ICD-10-CM | POA: Diagnosis not present

## 2017-03-04 DIAGNOSIS — I10 Essential (primary) hypertension: Secondary | ICD-10-CM | POA: Diagnosis not present

## 2017-03-04 DIAGNOSIS — J209 Acute bronchitis, unspecified: Secondary | ICD-10-CM | POA: Diagnosis not present

## 2017-03-04 DIAGNOSIS — I739 Peripheral vascular disease, unspecified: Secondary | ICD-10-CM | POA: Diagnosis not present

## 2017-03-04 DIAGNOSIS — J441 Chronic obstructive pulmonary disease with (acute) exacerbation: Secondary | ICD-10-CM | POA: Diagnosis not present

## 2017-03-06 ENCOUNTER — Other Ambulatory Visit (HOSPITAL_COMMUNITY): Payer: Self-pay | Admitting: Specialist

## 2017-03-06 DIAGNOSIS — R1319 Other dysphagia: Secondary | ICD-10-CM

## 2017-03-08 NOTE — Addendum Note (Signed)
Addended by: Lianne Cure A on: 03/08/2017 03:38 PM   Modules accepted: Orders

## 2017-03-21 ENCOUNTER — Ambulatory Visit (HOSPITAL_COMMUNITY): Payer: Medicare HMO | Attending: Gastroenterology | Admitting: Speech Pathology

## 2017-03-21 ENCOUNTER — Ambulatory Visit (HOSPITAL_COMMUNITY)
Admission: RE | Admit: 2017-03-21 | Discharge: 2017-03-21 | Disposition: A | Discharge status: Home / Self Care | Payer: Medicare HMO | Source: Ambulatory Visit | Attending: Gastroenterology | Admitting: Gastroenterology

## 2017-03-21 ENCOUNTER — Encounter (HOSPITAL_COMMUNITY): Payer: Self-pay | Admitting: Speech Pathology

## 2017-03-21 DIAGNOSIS — R1319 Other dysphagia: Secondary | ICD-10-CM | POA: Insufficient documentation

## 2017-03-21 DIAGNOSIS — R1310 Dysphagia, unspecified: Secondary | ICD-10-CM | POA: Diagnosis not present

## 2017-03-21 DIAGNOSIS — R1312 Dysphagia, oropharyngeal phase: Secondary | ICD-10-CM | POA: Insufficient documentation

## 2017-03-21 NOTE — Therapy (Signed)
[place], [place], [address] Phone: [phone]   Fax:  [phone]  Modified Barium Swallow  Patient Details  Name: [name] [name] MRN: [id number] Date of Birth: 1930-10-22 No Data Recorded  Encounter Date: 03/21/2017      End of Session - 03/21/17 1818    Visit Number 1   Number of Visits 1   Authorization Type [employer]   SLP Start Time 1300   SLP Stop Time  0932   SLP Time Calculation (min) 38 min   Activity Tolerance Patient tolerated treatment well      Past Medical History:  Diagnosis Date  . Alzheimer disease   . Anemia   . BPH (benign prostatic hyperplasia)   . CAD (coronary artery disease)    s/p CABG in 1997 with known atretic LIMA to LAD and 40% LM.    Marland Kitchen Carotid artery stenosis    s/p left CEA.> 20 yrs ago and > 70% right ICA stenosis - followed by Dr. Donnetta Hutching  . Chronic chest wall pain   . Chronic kidney disease (CKD), stage III (moderate)   . COPD (chronic obstructive pulmonary disease) ([place])   . Cough    cough with productive white to yellow sputum.  . DEMENTIA    "short term memory issues"  . Depression with anxiety   . GERD (gastroesophageal reflux disease)   . Glaucoma    bilateral  . Hyperlipemia   . Hypertension   . Memory loss   . Monoclonal gammopathy   . Neuromuscular disorder (HCC)    fingertips numbness"  . On home oxygen therapy    at bedtime-2 L/m nasally..    . Peptic ulcer disease   . Prostatitis   . PVD (peripheral vascular disease) ([place])    s/p left renal artery stent and left iliac stent  . Retinal artery occlusion     Past Surgical History:  Procedure Laterality Date  . BACK SURGERY  2011   lower back -disc  . CARDIAC SURGERY    . CATARACT EXTRACTION W/PHACO  09/18/2011   Procedure: CATARACT EXTRACTION PHACO AND INTRAOCULAR LENS PLACEMENT (IOC);  Surgeon: Elta Guadeloupe T. Gershon Crane, MD;  Location: [facility];  Service: Ophthalmology;  Laterality: Right;  CDE: 6.19   . COLONOSCOPY WITH PROPOFOL N/A 07/11/2015   Procedure: COLONOSCOPY WITH PROPOFOL;  Surgeon: Laurence Spates, MD;  Location: [facility];  Service: Endoscopy;  Laterality: N/A;  . CORONARY ARTERY BYPASS GRAFT  1997   "nerve pain at area"  . ESOPHAGOGASTRODUODENOSCOPY (EGD) WITH PROPOFOL N/A 07/11/2015   Procedure: ESOPHAGOGASTRODUODENOSCOPY (EGD) WITH PROPOFOL;  Surgeon: Laurence Spates, MD;  Location: [facility];  Service: Endoscopy;  Laterality: N/A;  . Left CEA  2002   carotid stent-left-   . RENAL ARTERY STENT      There were no vitals filed for this visit.      Subjective Assessment - 03/21/17 1800    Subjective "He starts coughing sometimes and it scares [place]."   Patient is accompained by: Family member   Special Tests MBSS   Currently in Pain? No/denies           General - 03/21/17 1801      General Information   Oral Cavity Assessment Within Functional Limits   Oral Care Completed by SLP No   Vision Functional for self feeding   Patient Positioning Upright in chair   Baseline Vocal Quality Normal   Anatomy Within functional limits   Pharyngeal  Secretions Not observed secondary MBS           Oral Preparation/Oral Phase - 03/21/17 1803      Oral Preparation/Oral Phase   Oral Phase Impaired     Oral - Nectar   Oral - Nectar Cup Not tested   Oral - Nectar Straw Within functional limits     Oral - Thin   Oral - Thin Cup Within functional limits   Oral - Thin Straw Within functional limits     Oral - Solids   Oral - Puree Delayed A-P transit;Oral residue;Piecemeal swallowing;Decreased bolus cohesion   Oral - Regular Decreased bolus cohesion;Delayed A-P transit;Oral residue;Piecemeal swallowing   Oral - Pill Delayed A-P transit  when taken puree     Electrical stimulation - Oral Phase   Was Electrical Stimulation Used No          Pharyngeal Phase - 03/21/17 1806      Pharyngeal Phase   Pharyngeal Phase Impaired     Pharyngeal - Honey    Pharyngeal- Honey Teaspoon Not tested     Pharyngeal - Nectar   Pharyngeal- Nectar Cup Not tested   Pharyngeal- Nectar Straw Swallow initiation at pyriform sinus;Delayed swallow initiation;Penetration/Aspiration before swallow   Pharyngeal Material does not enter airway;Material enters airway, remains ABOVE vocal cords then ejected out     Pharyngeal - Thin   Pharyngeal- Thin Teaspoon Not tested   Pharyngeal- Thin Cup Swallow initiation at pyriform sinus;Pharyngeal residue - pyriform   Pharyngeal Material does not enter airway   Pharyngeal- Thin Straw Swallow initiation at pyriform sinus;Penetration/Aspiration before swallow;Penetration/Aspiration during swallow;Pharyngeal residue - valleculae;Pharyngeal residue - pyriform   Pharyngeal Material enters airway, remains ABOVE vocal cords then ejected out;Material enters airway, remains ABOVE vocal cords and not ejected out;Material enters airway, CONTACTS cords and then ejected out     Pharyngeal - Solids   Pharyngeal- Puree Swallow initiation at vallecula   Pharyngeal- Regular Delayed swallow initiation-vallecula   Pharyngeal- Pill Swallow initiation at vallecula     Electrical Stimulation - Pharyngeal Phase   Was Electrical Stimulation Used No          Cricopharyngeal Phase - 03/21/17 1814      Cervical Esophageal Phase   Cervical Esophageal Phase Within functional limits  transient delay of barium tablet in distal esophagus but cleared with liquid wash           Plan - 03/21/17 1834    Clinical Impression Statement [name] [name] was seen for MBSS upright in the lateral position with barium tinged: thin (cup/straw), NTL (straw), puree, regular textures, and barium tablet presented in puree. Pt presents with mild to mild/mod oropharyngeal dysphagia characterized by delayed oral transit (increased delay noted with solids), premature spillage and delay in swallow initiation with swallow trigger at the level of the valleculae for puree  and solids and at the level of the pyriforms for liquids resulting in trace penetration of straw sips NTL before the swallow which was expelled from vestibule during the swallow, no penetration with cup sip THIN but increased residuals after the swallow in the pyriforms, and penetration to the vocal cords with straw sips THIN which was not completely removed from laryngeal vestibule spontaneously. Pt also noted to have moderate vallecular and pyriform residuals after taking sequential straw sips thin without sensation of same and required verbal cues to "swallow again" to clear. Pt with improved performance on today's MBSS as compared to MBSS in December 2017, which I suspect is due  to fluctuation in cognition/mentation. Pt noted to have significant delay in swallow trigger in 2017 which lead to aspiration of thins. His swallow response time today was faster and NO aspiration observed. Pt likely with intermittent episodes of aspiration due to mentation. His wife reports that Pt will sometimes cough on his own saliva. He has been consuming a modified regular diet texture at home (wife chops food) with NTL, but has been drinking thin water per wife. His wife reported that it was much easier to "get him to drink" liquids with use of a straw. He was assessed with a straw today and it is fine for him to use a straw when drinking NTL. Would recommend mechanical soft textures and NTL (straw ok and no need for chin tuck) during meals) and OK to have thin water by cup sip between meals (Pt should not use a straw with thin water and no chin tuck). Wife reports that a nurse instructed her to have her husband implement chin tuck, however this is not indicated and should not be recommended unless seen to be beneficial under objective assessment as some people may aspirate with a chin tuck. Written recommendations provided for Pt/spouse.   Treatment/Interventions Diet toleration management by SLP;Pharyngeal strengthening  exercises;Cueing hierarchy;SLP instruction and feedback;Patient/family education;Trials of upgraded texture/liquids;Compensatory techniques   Potential to Achieve Goals Fair   Potential Considerations Ability to learn/carryover information   Consulted and Agree with Plan of Care Patient;Family member/caregiver   Family Member Consulted Spouse      Patient will benefit from skilled therapeutic intervention in order to improve the following deficits and impairments:   Dysphagia, oropharyngeal phase      G-Codes - Apr 04, 2017 1836    Functional Assessment Tool Used MBSS; clinical judgment   Functional Limitations Swallowing   Swallow Current Status (F5732) At least 20 percent but less than 40 percent impaired, limited or restricted   Swallow Goal Status (K0254) At least 20 percent but less than 40 percent impaired, limited or restricted   Swallow Discharge Status [phone]) At least 20 percent but less than 40 percent impaired, limited or restricted          Recommendations/Treatment - 04-Apr-2017 1815      Swallow Evaluation Recommendations   SLP Diet Recommendations Dysphagia 3 (mechanical soft);Nectar  cup sips thin water between meals   Liquid Administration via Cup  OK to use straw with NTL; cup sips with thin water   Medication Administration Whole meds with puree   Supervision Patient able to self feed;Full supervision/cueing for compensatory strategies   Compensations Clear throat intermittently;Multiple dry swallows after each bite/sip   Postural Changes Seated upright at 90 degrees;Remain upright for at least 30 minutes after feeds/meals          Prognosis - 04-04-2017 1817      Prognosis   Prognosis for Safe Diet Advancement Fair   Barriers to Reach Goals Cognitive deficits  improvement from MBSS last December     Individuals Consulted   Consulted and Agree with Results and Recommendations Patient;Family member/caregiver   Family Member Consulted Pt and wife   Report  Sent to  Referring physician      Problem List Patient Active Problem List   Diagnosis Date Noted  . Dysphagia 12/13/2015  . Anorexia 12/13/2015  . CAP (community acquired pneumonia) 12/13/2015  . History of thrush 12/13/2015  . Hypertension, essential 12/13/2015  . Dementia 12/13/2015  . Upper airway cough syndrome 09/01/2015  . COPD GOLD II 04/26/2015  .  Chronic respiratory failure with hypoxia ([place]) 02/25/2015  . Carotid stenosis, asymptomatic 01/25/2015  . Delirium 10/15/2014  . Chronic chest wall pain   . Paresthesia 12/22/2013  . PVD (peripheral vascular disease) ([place])   . GERD (gastroesophageal reflux disease)   . CAD (coronary artery disease)   . Alzheimer disease   . Memory loss   . PAIN IN JOINT, MULTIPLE SITES 04/19/2009  . Allergic rhinitis 08/16/2008  . Dyslipidemia 03/26/2007  . MONOCLONAL GAMMOPATHY 03/26/2007  . CKD (chronic kidney disease) stage 3, GFR 30-59 ml/min 03/26/2007   Thank you,  Genene Churn, [place]  [facility] 03/21/2017, 6:37 PM  [place] [address] [place], [place], [address] Phone: [phone]   Fax:  [phone]  Name: [name] [name] MRN: [id number] Date of Birth: April 29, 1931

## 2017-03-28 ENCOUNTER — Ambulatory Visit: Payer: Self-pay

## 2017-03-29 ENCOUNTER — Other Ambulatory Visit: Payer: Self-pay

## 2017-03-29 NOTE — Patient Outreach (Signed)
[facility]) Care Management  [place]  03/29/2017   [name] [name] [birth year] [id number]  Subjective: Telephone call to caregiver for monthly call.  She reports that patient had a modified barium swallow and it was better than the last one.  Wife reports that he can had thickened liquid like buttermilk or V8 juice.  She reports that patient also has a new aide to assist her and today is her 3rd day and she has done well.  Discussed with wife the possible need for therapy to help with strengthening when he sees the primary doctor next week. She verbalized understanding.  She also reports he is still having some rectal pain when he sits and he has seen the urologist, gastroenterologist and the primary physician. She is using over the counter cream at this time as he ran out of the prescribed cream.  Advised wife to discuss it with the primary doctor next week as well as he possibly can prescribe the cream that was given by the gastroenterologist.  She verbalized understanding. Discussed with wife his dementia and progression as well as his COPD and action.  She verbalized understanding.   Objective:   Encounter Medications:  Outpatient Encounter Prescriptions as of 03/29/2017  Medication Sig  . acetaminophen (TYLENOL) 500 MG tablet Take 1,000 mg by mouth every 6 (six) hours as needed for headache.  Marland Kitchen aspirin 81 MG tablet Take 1 tablet (81 mg total) by mouth daily.  . calcitRIOL (ROCALTROL) 0.5 MCG capsule Take 0.5 mcg by mouth daily.  . Cholecalciferol (VITAMIN D) 2000 units CAPS Take 1 capsule by mouth every other day.  . ezetimibe (ZETIA) 10 MG tablet Take 1 tablet (10 mg total) by mouth daily.  . feeding supplement, ENSURE ENLIVE, (ENSURE ENLIVE) LIQD Take 237 mLs by mouth 2 (two) times daily between meals.  . fenofibrate 160 MG tablet Take 1 tablet (160 mg total) by mouth daily.  . ferrous sulfate 325 (65 FE) MG tablet Take 325 mg by mouth every morning.   .  finasteride (PROSCAR) 5 MG tablet Take 5 mg by mouth every morning.   . fluticasone (FLONASE) 50 MCG/ACT nasal spray Place 2 sprays into both nostrils daily as needed for allergies.   Marland Kitchen gabapentin (NEURONTIN) 300 MG capsule Take 1 capsule (300 mg total) by mouth 3 (three) times daily.  Marland Kitchen ipratropium (ATROVENT) 0.06 % nasal spray Place 1 spray into both nostrils 4 (four) times daily.  Marland Kitchen ipratropium-albuterol (DUONEB) 0.5-2.5 (3) MG/3ML SOLN Take 3 mLs by nebulization every 4 (four) hours as needed.  . lansoprazole (PREVACID) 30 MG capsule Take 30 mg by mouth daily.   Marland Kitchen latanoprost (XALATAN) 0.005 % ophthalmic solution Place 1 drop into both eyes at bedtime.   Marland Kitchen lubiprostone (AMITIZA) 24 MCG capsule Take 24 mcg by mouth daily as needed for constipation.   . memantine (NAMENDA) 10 MG tablet Take 1 tablet (10 mg total) by mouth 2 (two) times daily.  . metoCLOPramide (REGLAN) 10 MG tablet Take 10 mg by mouth 2 (two) times daily. Reported on 09/30/2015  . metroNIDAZOLE (FLAGYL) 500 MG tablet Take 1 tablet (500 mg total) by mouth 3 (three) times daily.  . Multiple Vitamin (MULTIVITAMIN WITH MINERALS) TABS tablet Take 1 tablet by mouth daily.  . nitroGLYCERIN (NITROSTAT) 0.4 MG SL tablet Place 1 tablet (0.4 mg total) under the tongue every 5 (five) minutes as needed for chest pain.  . OXYGEN Inhale 2 L into the lungs at bedtime. 2lpm  with sleep  . polyethylene glycol (MIRALAX / GLYCOLAX) packet Take 17 g by mouth daily as needed for moderate constipation.   Marland Kitchen PROCTO-MED HC 2.5 % rectal cream Place 1 application rectally 2 (two) times daily.  . Tamsulosin HCl (FLOMAX) 0.4 MG CAPS Take 0.8 mg by mouth at bedtime.   . vitamin B-12 (CYANOCOBALAMIN) 1000 MCG tablet Take 1,000 mcg by mouth daily.  . ciprofloxacin (CIPRO) 250 MG tablet Take 1 tablet (250 mg total) by mouth daily with breakfast. (Patient not taking: Reported on 02/27/2017)  . metoprolol succinate (TOPROL-XL) 25 MG 24 hr tablet TAKE 1 TABLET EVERY  DAY (Patient not taking: Reported on 02/27/2017)  . rosuvastatin (CRESTOR) 40 MG tablet Take 1 tablet (40 mg total) by mouth daily.   No facility-administered encounter medications on file as of 03/29/2017.     Functional Status:  No flowsheet data found.  Fall/Depression Screening: Fall Risk  03/29/2017 02/21/2017 01/21/2017  Falls in the past year? No No No  Risk for fall due to : - - -  Risk for fall due to: Comment - - -   PHQ 2/9 Scores 03/29/2017 02/21/2017 01/21/2017 12/25/2016 11/30/2016 08/30/2016 08/06/2016  PHQ - 2 Score 0 0 0 0 0 0 0  Exception Documentation - - - - - - -    Assessment: Patient continues to benefit from health coach outreach for disease management and support.    Plan:  [facility] CM Care Plan Problem One     Most Recent Value  Care Plan Problem One  COPD knowledge deficit  Role Documenting the Problem One  Health Coach  Care Plan for Problem One  Active  [facility] Long Term Goal   Caregiver will be able to verbalize action plan for COPD exacerbation in the next 90 days  [facility] Long Term Goal Start Date  03/29/17 Barrie Folk continued]  Interventions for Problem One Long Term Goal  RN Health Coach reiterated with caregiver signs and symptoms of COPD and when to notify physician.      [facility] CM Care Plan Problem Two     Most Recent Value  Care Plan Problem Two  Impaired Swallowing  Role Documenting the Problem Two  [place] for Problem Two  Active  Interventions for Problem Two Long Term Goal   RN Health Coach reiterated with caregiver chin tuck method when patient is swallowing and not using a straw for drinking.  Also reviewed signs and aspiration pneumonia and when to notify physician.    [facility] Long Term Goal  Caregiver will verbalize utilizing interventions to minimize aspiration within the next 60 days.  [facility] Long Term Goal Start Date  03/29/17 [goal continued]    [facility] CM Care Plan Problem Three     Most Recent Value  Care Plan Problem Three  Alzheimer's Disease  Role  Documenting the Problem [employer] for Problem Three  Active  [facility] Long Term Goal   Caregiver will verbalize understanding of alzheimer disease process and care within 90 days.    [facility] Long Term Goal Start Date  03/29/17 [goal continued]  Interventions for Problem Three Long Term Goal  RN Health Coach reiterated with caregiver wife alzheimer's disease and symptoms of decline.      [facility] CM Care Plan Problem One     Most Recent Value  Care Plan Problem One  COPD knowledge deficit  Role Documenting the Problem One  Health Coach  Care Plan for Problem One  Active  [facility] Long Term Goal   Caregiver will be able to verbalize action plan for COPD exacerbation in the next 90 days  [facility] Long Term Goal Start Date  03/29/17 Barrie Folk continued]  Interventions for Problem One Long Term Goal  RN Health Coach reiterated with caregiver signs and symptoms of COPD and when to notify physician.      [facility] CM Care Plan Problem Two     Most Recent Value  Care Plan Problem Two  Impaired Swallowing  Role Documenting the Problem Two  [place] for Problem Two  Active  Interventions for Problem Two Long Term Goal   RN Health Coach reiterated with caregiver chin tuck method when patient is swallowing and not using a straw for drinking.  Also reviewed signs and aspiration pneumonia and when to notify physician.    [facility] Long Term Goal  Caregiver will verbalize utilizing interventions to minimize aspiration within the next 60 days.  [facility] Long Term Goal Start Date  03/29/17 [goal continued]    [facility] CM Care Plan Problem Three     Most Recent Value  Care Plan Problem Three  Alzheimer's Disease  Role Documenting the Problem [place] for Problem Three  Active  [facility] Long Term Goal   Caregiver will verbalize understanding of alzheimer disease process and care within 90 days.    [facility] Long Term Goal Start Date  03/29/17 [goal continued]  Interventions for Problem Three Long Term Goal   RN Health Coach reiterated with caregiver wife alzheimer's disease and symptoms of decline.      RN Health Coach will contact patient in the month of October and patient agrees to next outreach.   [name], RN, MSN [place] [phone]

## 2017-04-02 DIAGNOSIS — R350 Frequency of micturition: Secondary | ICD-10-CM | POA: Diagnosis not present

## 2017-04-02 DIAGNOSIS — F0390 Unspecified dementia without behavioral disturbance: Secondary | ICD-10-CM | POA: Diagnosis not present

## 2017-04-02 DIAGNOSIS — E782 Mixed hyperlipidemia: Secondary | ICD-10-CM | POA: Diagnosis not present

## 2017-04-02 DIAGNOSIS — N184 Chronic kidney disease, stage 4 (severe): Secondary | ICD-10-CM | POA: Diagnosis not present

## 2017-04-02 DIAGNOSIS — J309 Allergic rhinitis, unspecified: Secondary | ICD-10-CM | POA: Diagnosis not present

## 2017-04-02 DIAGNOSIS — K219 Gastro-esophageal reflux disease without esophagitis: Secondary | ICD-10-CM | POA: Diagnosis not present

## 2017-04-02 DIAGNOSIS — Z23 Encounter for immunization: Secondary | ICD-10-CM | POA: Diagnosis not present

## 2017-04-02 DIAGNOSIS — E1122 Type 2 diabetes mellitus with diabetic chronic kidney disease: Secondary | ICD-10-CM | POA: Diagnosis not present

## 2017-04-02 DIAGNOSIS — G629 Polyneuropathy, unspecified: Secondary | ICD-10-CM | POA: Diagnosis not present

## 2017-04-04 DIAGNOSIS — I739 Peripheral vascular disease, unspecified: Secondary | ICD-10-CM | POA: Diagnosis not present

## 2017-04-04 DIAGNOSIS — E1142 Type 2 diabetes mellitus with diabetic polyneuropathy: Secondary | ICD-10-CM | POA: Diagnosis not present

## 2017-04-04 DIAGNOSIS — J449 Chronic obstructive pulmonary disease, unspecified: Secondary | ICD-10-CM | POA: Diagnosis not present

## 2017-04-04 DIAGNOSIS — F0390 Unspecified dementia without behavioral disturbance: Secondary | ICD-10-CM | POA: Diagnosis not present

## 2017-04-04 DIAGNOSIS — I10 Essential (primary) hypertension: Secondary | ICD-10-CM | POA: Diagnosis not present

## 2017-04-04 DIAGNOSIS — J441 Chronic obstructive pulmonary disease with (acute) exacerbation: Secondary | ICD-10-CM | POA: Diagnosis not present

## 2017-04-04 DIAGNOSIS — J9611 Chronic respiratory failure with hypoxia: Secondary | ICD-10-CM | POA: Diagnosis not present

## 2017-04-04 DIAGNOSIS — R262 Difficulty in walking, not elsewhere classified: Secondary | ICD-10-CM | POA: Diagnosis not present

## 2017-04-04 DIAGNOSIS — I129 Hypertensive chronic kidney disease with stage 1 through stage 4 chronic kidney disease, or unspecified chronic kidney disease: Secondary | ICD-10-CM | POA: Diagnosis not present

## 2017-04-04 DIAGNOSIS — E1122 Type 2 diabetes mellitus with diabetic chronic kidney disease: Secondary | ICD-10-CM | POA: Diagnosis not present

## 2017-04-04 DIAGNOSIS — D631 Anemia in chronic kidney disease: Secondary | ICD-10-CM | POA: Diagnosis not present

## 2017-04-04 DIAGNOSIS — R0600 Dyspnea, unspecified: Secondary | ICD-10-CM | POA: Diagnosis not present

## 2017-04-04 DIAGNOSIS — N184 Chronic kidney disease, stage 4 (severe): Secondary | ICD-10-CM | POA: Diagnosis not present

## 2017-04-05 DIAGNOSIS — R2689 Other abnormalities of gait and mobility: Secondary | ICD-10-CM | POA: Diagnosis not present

## 2017-04-05 DIAGNOSIS — E1122 Type 2 diabetes mellitus with diabetic chronic kidney disease: Secondary | ICD-10-CM | POA: Diagnosis not present

## 2017-04-05 DIAGNOSIS — N184 Chronic kidney disease, stage 4 (severe): Secondary | ICD-10-CM | POA: Diagnosis not present

## 2017-04-05 DIAGNOSIS — E1142 Type 2 diabetes mellitus with diabetic polyneuropathy: Secondary | ICD-10-CM | POA: Diagnosis not present

## 2017-04-05 DIAGNOSIS — R262 Difficulty in walking, not elsewhere classified: Secondary | ICD-10-CM | POA: Diagnosis not present

## 2017-04-05 DIAGNOSIS — J449 Chronic obstructive pulmonary disease, unspecified: Secondary | ICD-10-CM | POA: Diagnosis not present

## 2017-04-05 DIAGNOSIS — F0390 Unspecified dementia without behavioral disturbance: Secondary | ICD-10-CM | POA: Diagnosis not present

## 2017-04-05 DIAGNOSIS — I129 Hypertensive chronic kidney disease with stage 1 through stage 4 chronic kidney disease, or unspecified chronic kidney disease: Secondary | ICD-10-CM | POA: Diagnosis not present

## 2017-04-05 DIAGNOSIS — D631 Anemia in chronic kidney disease: Secondary | ICD-10-CM | POA: Diagnosis not present

## 2017-04-05 DIAGNOSIS — J9611 Chronic respiratory failure with hypoxia: Secondary | ICD-10-CM | POA: Diagnosis not present

## 2017-04-05 DIAGNOSIS — Z7409 Other reduced mobility: Secondary | ICD-10-CM | POA: Diagnosis not present

## 2017-04-09 ENCOUNTER — Ambulatory Visit: Payer: Medicare HMO | Admitting: Cardiology

## 2017-04-10 DIAGNOSIS — E1142 Type 2 diabetes mellitus with diabetic polyneuropathy: Secondary | ICD-10-CM | POA: Diagnosis not present

## 2017-04-10 DIAGNOSIS — I129 Hypertensive chronic kidney disease with stage 1 through stage 4 chronic kidney disease, or unspecified chronic kidney disease: Secondary | ICD-10-CM | POA: Diagnosis not present

## 2017-04-10 DIAGNOSIS — N184 Chronic kidney disease, stage 4 (severe): Secondary | ICD-10-CM | POA: Diagnosis not present

## 2017-04-10 DIAGNOSIS — D631 Anemia in chronic kidney disease: Secondary | ICD-10-CM | POA: Diagnosis not present

## 2017-04-10 DIAGNOSIS — F0390 Unspecified dementia without behavioral disturbance: Secondary | ICD-10-CM | POA: Diagnosis not present

## 2017-04-10 DIAGNOSIS — E1122 Type 2 diabetes mellitus with diabetic chronic kidney disease: Secondary | ICD-10-CM | POA: Diagnosis not present

## 2017-04-10 DIAGNOSIS — R262 Difficulty in walking, not elsewhere classified: Secondary | ICD-10-CM | POA: Diagnosis not present

## 2017-04-10 DIAGNOSIS — J449 Chronic obstructive pulmonary disease, unspecified: Secondary | ICD-10-CM | POA: Diagnosis not present

## 2017-04-10 DIAGNOSIS — J9611 Chronic respiratory failure with hypoxia: Secondary | ICD-10-CM | POA: Diagnosis not present

## 2017-04-12 DIAGNOSIS — R262 Difficulty in walking, not elsewhere classified: Secondary | ICD-10-CM | POA: Diagnosis not present

## 2017-04-12 DIAGNOSIS — J449 Chronic obstructive pulmonary disease, unspecified: Secondary | ICD-10-CM | POA: Diagnosis not present

## 2017-04-12 DIAGNOSIS — D631 Anemia in chronic kidney disease: Secondary | ICD-10-CM | POA: Diagnosis not present

## 2017-04-12 DIAGNOSIS — N184 Chronic kidney disease, stage 4 (severe): Secondary | ICD-10-CM | POA: Diagnosis not present

## 2017-04-12 DIAGNOSIS — E1142 Type 2 diabetes mellitus with diabetic polyneuropathy: Secondary | ICD-10-CM | POA: Diagnosis not present

## 2017-04-12 DIAGNOSIS — I129 Hypertensive chronic kidney disease with stage 1 through stage 4 chronic kidney disease, or unspecified chronic kidney disease: Secondary | ICD-10-CM | POA: Diagnosis not present

## 2017-04-12 DIAGNOSIS — E1122 Type 2 diabetes mellitus with diabetic chronic kidney disease: Secondary | ICD-10-CM | POA: Diagnosis not present

## 2017-04-12 DIAGNOSIS — F0390 Unspecified dementia without behavioral disturbance: Secondary | ICD-10-CM | POA: Diagnosis not present

## 2017-04-12 DIAGNOSIS — J9611 Chronic respiratory failure with hypoxia: Secondary | ICD-10-CM | POA: Diagnosis not present

## 2017-04-14 DIAGNOSIS — F0390 Unspecified dementia without behavioral disturbance: Secondary | ICD-10-CM | POA: Diagnosis not present

## 2017-04-14 DIAGNOSIS — R262 Difficulty in walking, not elsewhere classified: Secondary | ICD-10-CM | POA: Diagnosis not present

## 2017-04-14 DIAGNOSIS — I129 Hypertensive chronic kidney disease with stage 1 through stage 4 chronic kidney disease, or unspecified chronic kidney disease: Secondary | ICD-10-CM | POA: Diagnosis not present

## 2017-04-14 DIAGNOSIS — E1122 Type 2 diabetes mellitus with diabetic chronic kidney disease: Secondary | ICD-10-CM | POA: Diagnosis not present

## 2017-04-14 DIAGNOSIS — E1142 Type 2 diabetes mellitus with diabetic polyneuropathy: Secondary | ICD-10-CM | POA: Diagnosis not present

## 2017-04-14 DIAGNOSIS — D631 Anemia in chronic kidney disease: Secondary | ICD-10-CM | POA: Diagnosis not present

## 2017-04-14 DIAGNOSIS — J449 Chronic obstructive pulmonary disease, unspecified: Secondary | ICD-10-CM | POA: Diagnosis not present

## 2017-04-14 DIAGNOSIS — J9611 Chronic respiratory failure with hypoxia: Secondary | ICD-10-CM | POA: Diagnosis not present

## 2017-04-14 DIAGNOSIS — N184 Chronic kidney disease, stage 4 (severe): Secondary | ICD-10-CM | POA: Diagnosis not present

## 2017-04-17 ENCOUNTER — Encounter (HOSPITAL_COMMUNITY): Payer: Self-pay | Admitting: Emergency Medicine

## 2017-04-17 ENCOUNTER — Emergency Department (HOSPITAL_COMMUNITY)
Admission: EM | Admit: 2017-04-17 | Discharge: 2017-04-17 | Disposition: A | Discharge status: Home / Self Care | Payer: Medicare HMO | Attending: Emergency Medicine | Admitting: Emergency Medicine

## 2017-04-17 ENCOUNTER — Emergency Department (HOSPITAL_COMMUNITY): Payer: Medicare HMO

## 2017-04-17 DIAGNOSIS — J449 Chronic obstructive pulmonary disease, unspecified: Secondary | ICD-10-CM | POA: Diagnosis not present

## 2017-04-17 DIAGNOSIS — R531 Weakness: Secondary | ICD-10-CM

## 2017-04-17 DIAGNOSIS — Z87891 Personal history of nicotine dependence: Secondary | ICD-10-CM | POA: Insufficient documentation

## 2017-04-17 DIAGNOSIS — G309 Alzheimer's disease, unspecified: Secondary | ICD-10-CM | POA: Insufficient documentation

## 2017-04-17 DIAGNOSIS — R05 Cough: Secondary | ICD-10-CM | POA: Insufficient documentation

## 2017-04-17 DIAGNOSIS — E860 Dehydration: Secondary | ICD-10-CM | POA: Insufficient documentation

## 2017-04-17 DIAGNOSIS — R410 Disorientation, unspecified: Secondary | ICD-10-CM | POA: Insufficient documentation

## 2017-04-17 DIAGNOSIS — Z951 Presence of aortocoronary bypass graft: Secondary | ICD-10-CM | POA: Diagnosis not present

## 2017-04-17 DIAGNOSIS — I129 Hypertensive chronic kidney disease with stage 1 through stage 4 chronic kidney disease, or unspecified chronic kidney disease: Secondary | ICD-10-CM | POA: Diagnosis not present

## 2017-04-17 DIAGNOSIS — N183 Chronic kidney disease, stage 3 (moderate): Secondary | ICD-10-CM | POA: Diagnosis not present

## 2017-04-17 DIAGNOSIS — I2510 Atherosclerotic heart disease of native coronary artery without angina pectoris: Secondary | ICD-10-CM | POA: Insufficient documentation

## 2017-04-17 LAB — COMPREHENSIVE METABOLIC PANEL
ALBUMIN: 3.6 g/dL (ref 3.5–5.0)
ALT: 15 U/L — ABNORMAL LOW (ref 17–63)
AST: 27 U/L (ref 15–41)
Alkaline Phosphatase: 30 U/L — ABNORMAL LOW (ref 38–126)
Anion gap: 7 (ref 5–15)
BUN: 28 mg/dL — AB (ref 6–20)
CHLORIDE: 106 mmol/L (ref 101–111)
CO2: 25 mmol/L (ref 22–32)
Calcium: 9.8 mg/dL (ref 8.9–10.3)
Creatinine, Ser: 2.58 mg/dL — ABNORMAL HIGH (ref 0.61–1.24)
GFR calc Af Amer: 25 mL/min — ABNORMAL LOW (ref >60)
GFR calc non Af Amer: 21 mL/min — ABNORMAL LOW (ref >60)
GLUCOSE: 109 mg/dL — AB (ref 65–99)
POTASSIUM: 4.2 mmol/L (ref 3.5–5.1)
Sodium: 138 mmol/L (ref 135–145)
Total Bilirubin: 0.5 mg/dL (ref 0.3–1.2)
Total Protein: 8.1 g/dL (ref 6.5–8.1)

## 2017-04-17 LAB — CBC WITH DIFFERENTIAL/PLATELET
Basophils Absolute: 0 10*3/uL (ref 0.0–0.1)
Basophils Relative: 0 %
Eosinophils Absolute: 0.2 10*3/uL (ref 0.0–0.7)
Eosinophils Relative: 3 %
HEMATOCRIT: 33.2 % — AB (ref 39.0–52.0)
HEMOGLOBIN: 11.1 g/dL — AB (ref 13.0–17.0)
LYMPHS PCT: 38 %
Lymphs Abs: 2.3 10*3/uL (ref 0.7–4.0)
MCH: 32.2 pg (ref 26.0–34.0)
MCHC: 33.4 g/dL (ref 30.0–36.0)
MCV: 96.2 fL (ref 78.0–100.0)
MONOS PCT: 6 %
Monocytes Absolute: 0.4 10*3/uL (ref 0.1–1.0)
NEUTROS ABS: 3.1 10*3/uL (ref 1.7–7.7)
NEUTROS PCT: 53 %
Platelets: 219 10*3/uL (ref 150–400)
RBC: 3.45 MIL/uL — ABNORMAL LOW (ref 4.22–5.81)
RDW: 15.5 % (ref 11.5–15.5)
WBC: 5.9 10*3/uL (ref 4.0–10.5)

## 2017-04-17 LAB — TSH: TSH: 1.433 u[IU]/mL (ref 0.350–4.500)

## 2017-04-17 LAB — URINALYSIS, ROUTINE W REFLEX MICROSCOPIC
Bilirubin Urine: NEGATIVE
Glucose, UA: NEGATIVE mg/dL
Hgb urine dipstick: NEGATIVE
KETONES UR: NEGATIVE mg/dL
LEUKOCYTES UA: NEGATIVE
NITRITE: NEGATIVE
PH: 6 (ref 5.0–8.0)
Protein, ur: NEGATIVE mg/dL
SPECIFIC GRAVITY, URINE: 1.012 (ref 1.005–1.030)

## 2017-04-17 LAB — BRAIN NATRIURETIC PEPTIDE: B Natriuretic Peptide: 258 pg/mL — ABNORMAL HIGH (ref 0.0–100.0)

## 2017-04-17 LAB — TROPONIN I: Troponin I: <0.03 ng/mL (ref <0.03)

## 2017-04-17 LAB — MAGNESIUM: MAGNESIUM: 2 mg/dL (ref 1.7–2.4)

## 2017-04-17 LAB — OCCULT BLOOD X 1 CARD TO LAB, STOOL: FECAL OCCULT BLD: NEGATIVE

## 2017-04-17 LAB — AMMONIA: AMMONIA: 32 umol/L (ref 9–35)

## 2017-04-17 MED ORDER — AMOXICILLIN-POT CLAVULANATE 875-125 MG PO TABS: 1.0000 | ORAL_TABLET | Freq: Two times a day (BID) | ORAL | 0 refills | Status: AC

## 2017-04-17 MED ORDER — SODIUM CHLORIDE 0.9 % IV BOLUS (SEPSIS)
1000.0000 mL | Freq: Once | INTRAVENOUS | Status: AC
  Administered 2017-04-17: 1000 mL via INTRAVENOUS

## 2017-04-17 MED ORDER — AMOXICILLIN-POT CLAVULANATE 875-125 MG PO TABS
1.0000 | ORAL_TABLET | Freq: Once | ORAL | Status: AC
  Administered 2017-04-17: 1 via ORAL
  Filled 2017-04-17: qty 1

## 2017-04-17 NOTE — ED Triage Notes (Signed)
Pt wife states pt has been confused x 1 week, urinary freq. Denies v/d. Pt denies pain. Wife states stool has been black last 2 days more than usual being on an iron pill. Confusion noted.

## 2017-04-17 NOTE — ED Notes (Signed)
Pt unable to provide urine specimen at this time. Pt is more alert at this time.

## 2017-04-17 NOTE — ED Notes (Signed)
Checked on patient for urine sample,patient can't go right now,will checked back in 30 minutes.

## 2017-04-17 NOTE — ED Provider Notes (Signed)
Emergency Department Provider Note   I have reviewed the triage vital signs and the nursing notes.   HISTORY  Chief Complaint Weakness   HPI [name] [name] is a 83 y.o. male with multiple medical problems presents to the emergency department secondary to generalized weakness. It sounded patients have proctitis with symptoms for at least a couple months but the last couple days he has had increasing urinary frequency, cough and generalized fatigue. No fevers.but does have decreased appetite.no abdominal pain, back pain, chest pain or headaches. His wife states that his stools have been darker than normal even though he is on iron. Last colonoscopy was in 2016. Patient is unable to supply much history.   Past Medical History:  Diagnosis Date  . Alzheimer disease   . Anemia   . BPH (benign prostatic hyperplasia)   . CAD (coronary artery disease)    s/p CABG in 1997 with known atretic LIMA to LAD and 40% LM.    Marland Kitchen Carotid artery stenosis    s/p left CEA.> 20 yrs ago and > 70% right ICA stenosis - followed by Dr. Donnetta Hutching  . Chronic chest wall pain   . Chronic kidney disease (CKD), stage III (moderate)   . COPD (chronic obstructive pulmonary disease) ([place])   . Cough    cough with productive white to yellow sputum.  . DEMENTIA    "short term memory issues"  . Depression with anxiety   . GERD (gastroesophageal reflux disease)   . Glaucoma    bilateral  . Hyperlipemia   . Hypertension   . Memory loss   . Monoclonal gammopathy   . Neuromuscular disorder (HCC)    fingertips numbness"  . On home oxygen therapy    at bedtime-2 L/m nasally..    . Peptic ulcer disease   . Prostatitis   . PVD (peripheral vascular disease) ([place])    s/p left renal artery stent and left iliac stent  . Retinal artery occlusion     Patient Active Problem List   Diagnosis Date Noted  . Dysphagia 12/13/2015  . Anorexia 12/13/2015  . CAP (community acquired pneumonia) 12/13/2015  . History of thrush  12/13/2015  . Hypertension, essential 12/13/2015  . Dementia 12/13/2015  . Upper airway cough syndrome 09/01/2015  . COPD GOLD II 04/26/2015  . Chronic respiratory failure with hypoxia ([place]) 02/25/2015  . Carotid stenosis, asymptomatic 01/25/2015  . Delirium 10/15/2014  . Chronic chest wall pain   . Paresthesia 12/22/2013  . PVD (peripheral vascular disease) ([place])   . GERD (gastroesophageal reflux disease)   . CAD (coronary artery disease)   . Alzheimer disease   . Memory loss   . PAIN IN JOINT, MULTIPLE SITES 04/19/2009  . Allergic rhinitis 08/16/2008  . Dyslipidemia 03/26/2007  . MONOCLONAL GAMMOPATHY 03/26/2007  . CKD (chronic kidney disease) stage 3, GFR 30-59 ml/min 03/26/2007    Past Surgical History:  Procedure Laterality Date  . BACK SURGERY  2011   lower back -disc  . CARDIAC SURGERY    . CATARACT EXTRACTION W/PHACO  09/18/2011   Procedure: CATARACT EXTRACTION PHACO AND INTRAOCULAR LENS PLACEMENT (IOC);  Surgeon: Elta Guadeloupe T. Gershon Crane, MD;  Location: [facility];  Service: Ophthalmology;  Laterality: Right;  CDE: 6.19  . COLONOSCOPY WITH PROPOFOL N/A 07/11/2015   Procedure: COLONOSCOPY WITH PROPOFOL;  Surgeon: Laurence Spates, MD;  Location: [facility];  Service: Endoscopy;  Laterality: N/A;  . CORONARY ARTERY BYPASS GRAFT  1997   "nerve pain at area"  .  ESOPHAGOGASTRODUODENOSCOPY (EGD) WITH PROPOFOL N/A 07/11/2015   Procedure: ESOPHAGOGASTRODUODENOSCOPY (EGD) WITH PROPOFOL;  Surgeon: Laurence Spates, MD;  Location: [facility];  Service: Endoscopy;  Laterality: N/A;  . Left CEA  2002   carotid stent-left-   . RENAL ARTERY STENT      Current Outpatient Rx  . Order #: [id number] Class: Historical Med  . Order #: [id number] Class: OTC  . Order #: [id number] Class: Historical Med  . Order #: [id number] Class: Historical Med  . Order #: [id number] Class: Normal  . Order #: [id number] Class: Normal  . Order #: [id number] Class: Historical Med  . Order #: [id number] Class: Historical Med  .  Order #: [id number] Class: Historical Med  . Order #: [id number] Class: Normal  . Order #: [id number] Class: Print  . Order #: [id number] Class: Historical Med  . Order #: [id number] Class: Historical Med  . Order #: [id number] Class: Historical Med  . Order #: [id number] Class: Print  . Order #: [id number] Class: Print  . Order #: [id number] Class: Historical Med  . Order #: [id number] Class: Historical Med  . Order #: [id number] Class: Historical Med  . Order #: [id number] Class: Normal  . Order #: [id number] Class: Historical Med  . Order #: [id number] Class: Normal  . Order #: [id number] Class: Print  . Order #: [id number] Class: Historical Med  . Order #: [id number] Class: Normal  . Order #: [id number] Class: Historical Med  . Order #: [id number] Class: Historical Med  . Order #: [id number] Class: Historical Med  . Order #: [id number] Class: Historical Med    Allergies Patient has no known allergies.  Family History  Problem Relation Age of Onset  . Hypertension Father   . Hypertension Mother   . High blood pressure Unknown   . High Cholesterol Unknown   . Anesthesia problems Neg Hx   . Hypotension Neg Hx   . Malignant hyperthermia Neg Hx   . Pseudochol deficiency Neg Hx     Social History Social History  Substance Use Topics  . Smoking status: Former Smoker    Packs/day: 0.50    Years: 40.00    Types: Cigarettes    Quit date: 07/23/1978  . Smokeless tobacco: Never Used  . Alcohol use No    Review of Systems  All other systems negative except as documented in the HPI. All pertinent positives and negatives as reviewed in the HPI. ____________________________________________   PHYSICAL EXAM:  VITAL SIGNS: ED Triage Vitals [04/17/17 1828]  Enc Vitals Group     BP 116/65     Pulse Rate 62     Resp 20     Temp (!) 97.4 F (36.3 C)     Temp Source Oral     SpO2 92 %     Weight 160 lb (72.6 kg)    Constitutional: Alert but falls asleep easily. Well appearing and in no acute distress. Eyes:  Conjunctivae are normal. PERRL. EOMI. Head: Atraumatic. Nose: No congestion/rhinnorhea. Mouth/Throat: Mucous membranes are moist.  Oropharynx non-erythematous. Neck: No stridor.  No meningeal signs.   Cardiovascular: Normal rate, regular rhythm. Good peripheral circulation. Grossly normal heart sounds.   Respiratory: Normal respiratory effort.  No retractions. Lungs CTAB. Gastrointestinal: Soft and nontender. Possible mild distention.  Musculoskeletal: No lower extremity tenderness nor edema. No gross deformities of extremities. Neurologic:  Normal speech and language. No gross focal neurologic deficits are appreciated.  Skin:  Skin is warm, dry and intact. No rash noted.   ____________________________________________   LABS (all labs ordered are listed, but only abnormal results are displayed)  Labs Reviewed  CBC WITH DIFFERENTIAL/PLATELET - Abnormal; Notable for the  following:       Result Value   RBC 3.45 (*)    Hemoglobin 11.1 (*)    HCT 33.2 (*)    All other components within normal limits  BRAIN NATRIURETIC PEPTIDE - Abnormal; Notable for the following:    B Natriuretic Peptide 258.0 (*)    All other components within normal limits  COMPREHENSIVE METABOLIC PANEL - Abnormal; Notable for the following:    Glucose, Bld 109 (*)    BUN 28 (*)    Creatinine, Ser 2.58 (*)    ALT 15 (*)    Alkaline Phosphatase 30 (*)    GFR calc non Af Amer 21 (*)    GFR calc Af Amer 25 (*)    All other components within normal limits  URINALYSIS, ROUTINE W REFLEX MICROSCOPIC  TROPONIN I  OCCULT BLOOD X 1 CARD TO LAB, STOOL  MAGNESIUM  AMMONIA  TSH  LACTIC ACID, PLASMA  LACTIC ACID, PLASMA   ____________________________________________  EKG   EKG Interpretation  Date/Time:  Wednesday April 17 2017 18:46:55 EDT Ventricular Rate:  62 PR Interval:    QRS Duration: 90 QT Interval:  421 QTC Calculation: 428 R Axis:   47 Text Interpretation:  Sinus rhythm No significant change  since last tracing Confirmed by Merrily Pew [phone]) on 04/17/2017 7:01:01 PM       ____________________________________________  RADIOLOGY  Dg Chest 2 View  Result Date: 04/17/2017 CLINICAL DATA:  Confusion EXAM: CHEST  2 VIEW COMPARISON:  01/10/2017, 06/29/2016, CT 06/08/2016 FINDINGS: Post sternotomy changes. Emphysematous changes. Coarse opacity within the bilateral lungs consistent with chronic interstitial change or fibrosis. Similar appearance compared to prior radiograph. No acute consolidation or pleural effusion. Stable borderline cardiomegaly. No pneumothorax. IMPRESSION: Emphysematous changes and fibrosis. No definite acute infiltrate is seen Electronically Signed   By: Donavan Foil M.D.   On: 04/17/2017 19:31    ____________________________________________   PROCEDURES  Procedure(s) performed:   Procedures   ____________________________________________   INITIAL IMPRESSION / ASSESSMENT AND PLAN / ED COURSE  Pertinent labs & imaging results that were available during my care of the patient were reviewed by me and considered in my medical decision making (see chart for details).  Infection vs metabolic vs drug effect?  woruip negative. Wife states that patient is much improved after fluids. Is requesting something for his sinusitis, Augmentin started.  Patient much closer to baseline per wife so will be dc to follow upw ith PCP.    ____________________________________________  FINAL CLINICAL IMPRESSION(S) / ED DIAGNOSES  Final diagnoses:  Weakness  Dehydration     MEDICATIONS GIVEN DURING THIS VISIT:  Medications  sodium chloride 0.9 % bolus 1,000 mL (0 mLs Intravenous Stopped 04/17/17 2128)  sodium chloride 0.9 % bolus 1,000 mL (1,000 mLs Intravenous New Bag/Given 04/17/17 2115)  amoxicillin-clavulanate (AUGMENTIN) 875-125 MG per tablet 1 tablet (1 tablet Oral Given 04/17/17 2123)     NEW OUTPATIENT MEDICATIONS STARTED DURING THIS VISIT:  New  Prescriptions   AMOXICILLIN-CLAVULANATE (AUGMENTIN) 875-125 MG TABLET    Take 1 tablet by mouth 2 (two) times daily. One po bid x 7 days    Note:  This document was prepared using Dragon voice recognition software and may include unintentional dictation errors.   Merrily Pew, MD 04/17/17 2134

## 2017-04-18 DIAGNOSIS — J9611 Chronic respiratory failure with hypoxia: Secondary | ICD-10-CM | POA: Diagnosis not present

## 2017-04-18 DIAGNOSIS — R262 Difficulty in walking, not elsewhere classified: Secondary | ICD-10-CM | POA: Diagnosis not present

## 2017-04-18 DIAGNOSIS — D631 Anemia in chronic kidney disease: Secondary | ICD-10-CM | POA: Diagnosis not present

## 2017-04-18 DIAGNOSIS — E1122 Type 2 diabetes mellitus with diabetic chronic kidney disease: Secondary | ICD-10-CM | POA: Diagnosis not present

## 2017-04-18 DIAGNOSIS — F0390 Unspecified dementia without behavioral disturbance: Secondary | ICD-10-CM | POA: Diagnosis not present

## 2017-04-18 DIAGNOSIS — N184 Chronic kidney disease, stage 4 (severe): Secondary | ICD-10-CM | POA: Diagnosis not present

## 2017-04-18 DIAGNOSIS — E1142 Type 2 diabetes mellitus with diabetic polyneuropathy: Secondary | ICD-10-CM | POA: Diagnosis not present

## 2017-04-18 DIAGNOSIS — J449 Chronic obstructive pulmonary disease, unspecified: Secondary | ICD-10-CM | POA: Diagnosis not present

## 2017-04-18 DIAGNOSIS — I129 Hypertensive chronic kidney disease with stage 1 through stage 4 chronic kidney disease, or unspecified chronic kidney disease: Secondary | ICD-10-CM | POA: Diagnosis not present

## 2017-04-19 NOTE — Progress Notes (Deleted)
[facility]  PATIENT: [name] [name] DOB: 03/16/1931   REASON FOR VISIT: *** HISTORY FROM:    HISTORY OF PRESENT ILLNESS:Ikey E Audino is a 83 years old right-handed male follow-up for memory loss, he was a patient of Dr. Jules Husbands since February 2009, for evaluation of memory trouble, his primary care physician is Dr. Ivery Quale.  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 Some anxiety, improved on Remeron and prn Xanax.  He is occasionally irritable and easily provoked to anger. Wife says that he has decreased appetite recently. UPDATE June 2nd 2015: He now came in complaining of bilateral feet and hands paresthesia, which is new since Feb 2015, his symptoms are getting worse, he could hardly picking up things, he also complains of mild gait difficulty, low back pain, no bowel and bladder incontinence,  Recent laboratory evaluation in May 21st 2013, showed mild anemia, hemoglobin 11.8, glucose 96, B12 1004, normal TSH, protein electrophoresis showed elevated M spike a 1.3, mild increased of creatinine 1.57, with GFR of 33, normal liver function tests previously. He is going to be evaluated by hematologist Dr. Judeen Hammans soon. He also complains of low back pain, no significant neck pain,  UPDATE July 6th  2015: He was evaluated by Dr. Benay Spice for chronic monoclonal IgG lambda protein. There is no clinical evidence of progression to multiple myeloma. The M spike has not changed significantly over many years.  He still has numbness tinglings in his fingers, even to his arms and chest, frightening to him. MRI lumbar in May 2015: L5-S1: pseudo-disc bulging and facet hypertrophy with mild-moderate right and moderate left foraminal stenosis. L3-4: disc bulging and facet hypertrophy with mild biforaminal stenosis. L4-5: pseudo-disc bulging and facet hypertrophy with mild biforaminal stenosis. notable for left renal cyst (5.7cm).  MRI cervical spine in May 2015: C5-6: disc bulging and uncovertebral joint hypertrophy with severe right and moderate left foraminal stenosis. Disc bulging and spondylosis from C4-5 to T2-3. He has history of chronic renal disease, is going to be see by his nephrologist Dr. Posey Pronto in August 2015, I have suggested continued followup with his incidental finding of a left renal cyst with his nephrologist Dr. Posey Pronto  UPDATE April 7th 2016: He continue has slow worsening memory trouble, taking Namenda xr 28 mg, Aricept 10 mg daily, COPD, recovering from pneumonia, Complains of excessive sleepiness,  He is not eating well, he has bad taste in his mouth, Ambulate without difficulty, he has stopped nortriptyline, taking gabapentin for lower extremity pain,   UPDATE Oct 17th 2017: He is with his wife at today's clinical visit, he took a bus here, complains of stomach pain, not feeling well, he was recently evaluated by his primary care physician Dr. Moreen Fowler, he continue has slow worsening memory loss, without behavior issues,  UPDATE October 18 2016: He is accompanied by  his wife at today's clinical visit, he has worsening memory loss, needing more help in his daily activity, has visual hallucinations, today's Mini-Mental status examination is only 8/8,   REVIEW OF SYSTEMS:  Full 14 system review of systems performed and notable only for those listed, all others are neg:  Constitutional: neg  Cardiovascular: neg Ear/Nose/Throat: neg  Skin: neg Eyes: neg Respiratory: neg Gastroitestinal: neg  Hematology/Lymphatic: neg  Endocrine: neg Musculoskeletal:neg Allergy/Immunology: neg Neurological: neg Psychiatric: neg Sleep : neg   ALLERGIES: No Known Allergies  HOME MEDICATIONS: Outpatient Medications Prior to Visit  Medication Sig Dispense Refill  . acetaminophen (TYLENOL) 500 MG tablet Take 1,000 mg by mouth every 6 (six) hours as needed for headache.    Marland Kitchen amoxicillin-clavulanate (AUGMENTIN) 875-125 MG tablet Take 1 tablet by mouth 2 (two) times daily. One po bid x 7 days 14 tablet 0  . aspirin 81 MG tablet Take 1 tablet (81 mg total) by mouth daily.    . calcitRIOL (ROCALTROL) 0.5 MCG capsule Take 0.5 mcg by mouth daily.    . Cholecalciferol (VITAMIN D) 2000 units CAPS Take 1 capsule by mouth every other day.    . ezetimibe (ZETIA) 10 MG tablet Take 1 tablet (10 mg total) by mouth daily. 90 tablet 3  . feeding supplement, ENSURE ENLIVE, (ENSURE ENLIVE) LIQD Take 237 mLs by mouth 2 (two) times daily between meals. 30 Bottle 6  . fenofibrate 160 MG tablet Take 1 tablet (160 mg total) by mouth daily. 90 tablet 3  . ferrous sulfate 325 (65 FE) MG tablet Take 325 mg by mouth every morning.     . finasteride (PROSCAR) 5 MG tablet Take 5 mg by mouth every morning.     . fluticasone (FLONASE) 50 MCG/ACT nasal spray Place 2 sprays into both nostrils 2 (two) times daily.     Marland Kitchen gabapentin (NEURONTIN) 300 MG capsule Take 1 capsule (300 mg total) by mouth 3 (three) times daily. (Patient taking differently: Take 300 mg by mouth at bedtime. ) 270 capsule 3  . ipratropium (ATROVENT) 0.06 % nasal spray Place 1 spray into both nostrils 4 (four) times daily.    Marland Kitchen ipratropium-albuterol (DUONEB) 0.5-2.5 (3) MG/3ML SOLN Take 3 mLs by nebulization every 4 (four) hours as  needed. (Patient taking differently: Take 3 mLs by nebulization every 4 (four) hours as needed (for shortness of breath). ) 360 mL 11  . lansoprazole (PREVACID) 30 MG capsule Take 30 mg by mouth daily.     Marland Kitchen latanoprost (XALATAN) 0.005 % ophthalmic solution Place 1 drop into both eyes at bedtime.     Marland Kitchen lubiprostone (AMITIZA) 24 MCG capsule Take 24 mcg by mouth daily as needed for constipation.     . memantine (NAMENDA) 10 MG tablet Take 1 tablet (10 mg total) by mouth 2 (two) times daily. 60 tablet 11  . metoCLOPramide (REGLAN) 10 MG tablet Take 10 mg by mouth 2 (two) times daily. Reported on 09/30/2015    . metoprolol succinate (TOPROL-XL) 25 MG 24 hr tablet TAKE 1 TABLET EVERY DAY (Patient not taking: Reported on 02/27/2017) 90 tablet 2  . metroNIDAZOLE (FLAGYL) 500 MG tablet Take 1 tablet (500 mg total) by mouth 3 (three) times daily. 21 tablet 0  . Multiple Vitamin (MULTIVITAMIN WITH MINERALS) TABS tablet Take 1 tablet by mouth daily.    . nitroGLYCERIN (NITROSTAT) 0.4 MG SL tablet Place 1 tablet (0.4 mg total) under the tongue every 5 (five) minutes as needed for chest pain. 25  tablet 11  . OXYGEN Inhale 2 L into the lungs at bedtime. 2lpm with sleep    . polyethylene glycol (MIRALAX / GLYCOLAX) packet Take 17 g by mouth daily as needed for moderate constipation.     Marland Kitchen PROCTO-MED HC 2.5 % rectal cream Place 1 application rectally 2 (two) times daily.    . rosuvastatin (CRESTOR) 40 MG tablet Take 40 mg by mouth every evening.    . Tamsulosin HCl (FLOMAX) 0.4 MG CAPS Take 0.8 mg by mouth at bedtime.     . vitamin B-12 (CYANOCOBALAMIN) 1000 MCG tablet Take 1,000 mcg by mouth daily.     No facility-administered medications prior to visit.     PAST MEDICAL HISTORY: Past Medical History:  Diagnosis Date  . Alzheimer disease   . Anemia   . BPH (benign prostatic hyperplasia)   . CAD (coronary artery disease)    s/p CABG in 1997 with known atretic LIMA to LAD and 40% LM.    Marland Kitchen Carotid artery  stenosis    s/p left CEA.> 20 yrs ago and > 70% right ICA stenosis - followed by Dr. Donnetta Hutching  . Chronic chest wall pain   . Chronic kidney disease (CKD), stage III (moderate)   . COPD (chronic obstructive pulmonary disease) ([place])   . Cough    cough with productive white to yellow sputum.  . DEMENTIA    "short term memory issues"  . Depression with anxiety   . GERD (gastroesophageal reflux disease)   . Glaucoma    bilateral  . Hyperlipemia   . Hypertension   . Memory loss   . Monoclonal gammopathy   . Neuromuscular disorder (HCC)    fingertips numbness"  . On home oxygen therapy    at bedtime-2 L/m nasally..    . Peptic ulcer disease   . Prostatitis   . PVD (peripheral vascular disease) ([place])    s/p left renal artery stent and left iliac stent  . Retinal artery occlusion     PAST SURGICAL HISTORY: Past Surgical History:  Procedure Laterality Date  . BACK SURGERY  2011   lower back -disc  . CARDIAC SURGERY    . CATARACT EXTRACTION W/PHACO  09/18/2011   Procedure: CATARACT EXTRACTION PHACO AND INTRAOCULAR LENS PLACEMENT (IOC);  Surgeon: Elta Guadeloupe T. Gershon Crane, MD;  Location: [facility];  Service: Ophthalmology;  Laterality: Right;  CDE: 6.19  . COLONOSCOPY WITH PROPOFOL N/A 07/11/2015   Procedure: COLONOSCOPY WITH PROPOFOL;  Surgeon: Laurence Spates, MD;  Location: [facility];  Service: Endoscopy;  Laterality: N/A;  . CORONARY ARTERY BYPASS GRAFT  1997   "nerve pain at area"  . ESOPHAGOGASTRODUODENOSCOPY (EGD) WITH PROPOFOL N/A 07/11/2015   Procedure: ESOPHAGOGASTRODUODENOSCOPY (EGD) WITH PROPOFOL;  Surgeon: Laurence Spates, MD;  Location: [facility];  Service: Endoscopy;  Laterality: N/A;  . Left CEA  2002   carotid stent-left-   . RENAL ARTERY STENT      FAMILY HISTORY: Family History  Problem Relation Age of Onset  . Hypertension Father   . Hypertension Mother   . High blood pressure Unknown   . High Cholesterol Unknown   . Anesthesia problems Neg Hx   . Hypotension Neg Hx     . Malignant hyperthermia Neg Hx   . Pseudochol deficiency Neg Hx     SOCIAL HISTORY: Social History   Social History  . Marital status: Married    Spouse name: Peter Congo  . Number of children: 8  . Years of education: 50  Occupational History  .  Retired    Retired   Social History Main Topics  . Smoking status: Former Smoker    Packs/day: 0.50    Years: 40.00    Types: Cigarettes    Quit date: 07/23/1978  . Smokeless tobacco: Never Used  . Alcohol use No  . Drug use: No  . Sexual activity: Not on file   Other Topics Concern  . Not on file   Social History Narrative   Patient lives at home with his wife. Peter Congo). Patient is retired. Patient has one Year of college.   Right handed.   Caffeine- sometimes     PHYSICAL EXAM  There were no vitals filed for this visit. There is no height or weight on file to calculate BMI.  Generalized: Well developed, in no acute distress  Head: normocephalic and atraumatic,. Oropharynx benign  Neck: Supple, no carotid bruits  Cardiac: Regular rate rhythm, no murmur  Musculoskeletal: No deformity   Neurological examination   Mentation: Alert oriented to time, place, history taking. Attention span and concentration appropriate. Recent and remote memory intact.  Follows all commands speech and language fluent.   Cranial nerve II-XII: Fundoscopic exam reveals sharp disc margins.Pupils were equal round reactive to light extraocular movements were full, visual field were full on confrontational test. Facial sensation and strength were normal. hearing was intact to finger rubbing bilaterally. Uvula tongue midline. head turning and shoulder shrug were normal and symmetric.Tongue protrusion into cheek strength was normal. Motor: normal bulk and tone, full strength in the BUE, BLE, fine finger movements normal, no pronator drift. No focal weakness Sensory: normal and symmetric to light touch, pinprick, and  Vibration, proprioception   Coordination: finger-nose-finger, heel-to-shin bilaterally, no dysmetria Reflexes: Brachioradialis 2/2, biceps 2/2, triceps 2/2, patellar 2/2, Achilles 2/2, plantar responses were flexor bilaterally. Gait and Station: Rising up from seated position without assistance, normal stance,  moderate stride, good arm swing, smooth turning, able to perform tiptoe, and heel walking without difficulty. Tandem gait is steady  DIAGNOSTIC DATA (LABS, IMAGING, TESTING) - I reviewed patient records, labs, notes, testing and imaging myself where available.  Lab Results  Component Value Date   WBC 5.9 04/17/2017   HGB 11.1 (L) 04/17/2017   HCT 33.2 (L) 04/17/2017   MCV 96.2 04/17/2017   PLT 219 04/17/2017      Component Value Date/Time   NA 138 04/17/2017 1855   NA 142 12/30/2013 0958   K 4.2 04/17/2017 1855   K 5.2 (H) 12/30/2013 0958   CL 106 04/17/2017 1855   CL 111 (H) 04/14/2012 1049   CO2 25 04/17/2017 1855   CO2 26 12/30/2013 0958   GLUCOSE 109 (H) 04/17/2017 1855   GLUCOSE 97 12/30/2013 0958   GLUCOSE 99 04/14/2012 1049   BUN 28 (H) 04/17/2017 1855   BUN 19.8 12/30/2013 0958   CREATININE 2.58 (H) 04/17/2017 1855   CREATININE 2.65 (H) 03/16/2016 1012   CREATININE 2.1 (H) 12/30/2013 0958   CALCIUM 9.8 04/17/2017 1855   CALCIUM 9.5 12/30/2013 0958   PROT 8.1 04/17/2017 1855   PROT 8.1 12/30/2013 0958   ALBUMIN 3.6 04/17/2017 1855   ALBUMIN 3.8 12/30/2013 0958   AST 27 04/17/2017 1855   AST 28 12/30/2013 0958   ALT 15 (L) 04/17/2017 1855   ALT 19 12/30/2013 0958   ALKPHOS 30 (L) 04/17/2017 1855   ALKPHOS 58 12/30/2013 0958   BILITOT 0.5 04/17/2017 1855   BILITOT 0.27 12/30/2013 0958   GFRNONAA  21 (L) 04/17/2017 1855   GFRAA 25 (L) 04/17/2017 1855   Lab Results  Component Value Date   CHOL 145 07/09/2016   HDL 27 (L) 07/09/2016   LDLCALC 78 07/09/2016   LDLDIRECT 110.0 12/09/2014   TRIG 201 (H) 07/09/2016   CHOLHDL 5.4 (H) 07/09/2016   Lab Results  Component Value Date    HGBA1C 6.5 (H) 12/22/2013   No results found for: [id number] Lab Results  Component Value Date   TSH 1.433 04/17/2017    ***  ASSESSMENT AND PLAN  83 y.o. year old male  has a past medical history of Alzheimer disease; Anemia; BPH (benign prostatic hyperplasia); CAD (coronary artery disease); Carotid artery stenosis; Chronic chest wall pain; Chronic kidney disease (CKD), stage III (moderate); COPD (chronic obstructive pulmonary disease) ([place]); Cough; DEMENTIA; Depression with anxiety; GERD (gastroesophageal reflux disease); Glaucoma; Hyperlipemia; Hypertension; Memory loss; Monoclonal gammopathy; Neuromuscular disorder ([place]); On home oxygen therapy; Peptic ulcer disease; Prostatitis; PVD (peripheral vascular disease) ([place]); and Retinal artery occlusion. here with ***  Moderate dementia, Progressive worsening, with hallucinations.             Mini-Mental Status Examination 8/30, animal naming 5                     Continue Namenda             Stop aricept  Polypharmacy treatment             I went over the medication list with patient and his wife                     Gabapentin 300 mg tablets, I have advised his wife to decrease the dosage, if possible only nighttime as needed to decrease the side effect  Dennie Bible, [facility], [facility], APRN  [facility] [address] [place], [place] [address] [phone]

## 2017-04-22 ENCOUNTER — Emergency Department (HOSPITAL_COMMUNITY): Payer: Medicare HMO

## 2017-04-22 ENCOUNTER — Inpatient Hospital Stay (HOSPITAL_COMMUNITY)
Admission: EM | Admit: 2017-04-22 | Discharge: 2017-05-23 | DRG: 682 | Disposition: E | Payer: Medicare HMO | Attending: Pulmonary Disease | Admitting: Pulmonary Disease

## 2017-04-22 ENCOUNTER — Ambulatory Visit: Payer: Medicare HMO | Admitting: Nurse Practitioner

## 2017-04-22 ENCOUNTER — Encounter (HOSPITAL_COMMUNITY): Payer: Self-pay | Admitting: Emergency Medicine

## 2017-04-22 DIAGNOSIS — Z452 Encounter for adjustment and management of vascular access device: Secondary | ICD-10-CM

## 2017-04-22 DIAGNOSIS — G931 Anoxic brain damage, not elsewhere classified: Secondary | ICD-10-CM | POA: Diagnosis not present

## 2017-04-22 DIAGNOSIS — R7989 Other specified abnormal findings of blood chemistry: Secondary | ICD-10-CM

## 2017-04-22 DIAGNOSIS — R748 Abnormal levels of other serum enzymes: Secondary | ICD-10-CM

## 2017-04-22 DIAGNOSIS — Z87891 Personal history of nicotine dependence: Secondary | ICD-10-CM

## 2017-04-22 DIAGNOSIS — J9611 Chronic respiratory failure with hypoxia: Secondary | ICD-10-CM | POA: Diagnosis not present

## 2017-04-22 DIAGNOSIS — I469 Cardiac arrest, cause unspecified: Secondary | ICD-10-CM | POA: Diagnosis not present

## 2017-04-22 DIAGNOSIS — W19XXXA Unspecified fall, initial encounter: Secondary | ICD-10-CM | POA: Diagnosis present

## 2017-04-22 DIAGNOSIS — E874 Mixed disorder of acid-base balance: Secondary | ICD-10-CM | POA: Diagnosis not present

## 2017-04-22 DIAGNOSIS — A419 Sepsis, unspecified organism: Secondary | ICD-10-CM | POA: Diagnosis not present

## 2017-04-22 DIAGNOSIS — S0990XA Unspecified injury of head, initial encounter: Secondary | ICD-10-CM | POA: Diagnosis not present

## 2017-04-22 DIAGNOSIS — Z4682 Encounter for fitting and adjustment of non-vascular catheter: Secondary | ICD-10-CM | POA: Diagnosis not present

## 2017-04-22 DIAGNOSIS — J9621 Acute and chronic respiratory failure with hypoxia: Secondary | ICD-10-CM | POA: Diagnosis present

## 2017-04-22 DIAGNOSIS — K4020 Bilateral inguinal hernia, without obstruction or gangrene, not specified as recurrent: Secondary | ICD-10-CM | POA: Diagnosis present

## 2017-04-22 DIAGNOSIS — Z0189 Encounter for other specified special examinations: Secondary | ICD-10-CM

## 2017-04-22 DIAGNOSIS — I2510 Atherosclerotic heart disease of native coronary artery without angina pectoris: Secondary | ICD-10-CM | POA: Diagnosis present

## 2017-04-22 DIAGNOSIS — J439 Emphysema, unspecified: Secondary | ICD-10-CM | POA: Diagnosis present

## 2017-04-22 DIAGNOSIS — K7200 Acute and subacute hepatic failure without coma: Secondary | ICD-10-CM | POA: Diagnosis not present

## 2017-04-22 DIAGNOSIS — K219 Gastro-esophageal reflux disease without esophagitis: Secondary | ICD-10-CM | POA: Diagnosis present

## 2017-04-22 DIAGNOSIS — N183 Chronic kidney disease, stage 3 (moderate): Secondary | ICD-10-CM

## 2017-04-22 DIAGNOSIS — Z4659 Encounter for fitting and adjustment of other gastrointestinal appliance and device: Secondary | ICD-10-CM

## 2017-04-22 DIAGNOSIS — I25708 Atherosclerosis of coronary artery bypass graft(s), unspecified, with other forms of angina pectoris: Secondary | ICD-10-CM | POA: Diagnosis not present

## 2017-04-22 DIAGNOSIS — Z6826 Body mass index (BMI) 26.0-26.9, adult: Secondary | ICD-10-CM

## 2017-04-22 DIAGNOSIS — J9601 Acute respiratory failure with hypoxia: Secondary | ICD-10-CM | POA: Diagnosis not present

## 2017-04-22 DIAGNOSIS — Z951 Presence of aortocoronary bypass graft: Secondary | ICD-10-CM | POA: Diagnosis not present

## 2017-04-22 DIAGNOSIS — Z79899 Other long term (current) drug therapy: Secondary | ICD-10-CM | POA: Diagnosis not present

## 2017-04-22 DIAGNOSIS — G309 Alzheimer's disease, unspecified: Secondary | ICD-10-CM | POA: Diagnosis present

## 2017-04-22 DIAGNOSIS — I130 Hypertensive heart and chronic kidney disease with heart failure and stage 1 through stage 4 chronic kidney disease, or unspecified chronic kidney disease: Secondary | ICD-10-CM | POA: Diagnosis present

## 2017-04-22 DIAGNOSIS — N400 Enlarged prostate without lower urinary tract symptoms: Secondary | ICD-10-CM | POA: Diagnosis present

## 2017-04-22 DIAGNOSIS — Z9841 Cataract extraction status, right eye: Secondary | ICD-10-CM

## 2017-04-22 DIAGNOSIS — E1122 Type 2 diabetes mellitus with diabetic chronic kidney disease: Secondary | ICD-10-CM | POA: Diagnosis present

## 2017-04-22 DIAGNOSIS — E440 Moderate protein-calorie malnutrition: Secondary | ICD-10-CM | POA: Diagnosis present

## 2017-04-22 DIAGNOSIS — I248 Other forms of acute ischemic heart disease: Secondary | ICD-10-CM | POA: Diagnosis not present

## 2017-04-22 DIAGNOSIS — E2740 Unspecified adrenocortical insufficiency: Secondary | ICD-10-CM | POA: Diagnosis not present

## 2017-04-22 DIAGNOSIS — I21A1 Myocardial infarction type 2: Secondary | ICD-10-CM | POA: Diagnosis not present

## 2017-04-22 DIAGNOSIS — E860 Dehydration: Secondary | ICD-10-CM

## 2017-04-22 DIAGNOSIS — I959 Hypotension, unspecified: Secondary | ICD-10-CM | POA: Diagnosis not present

## 2017-04-22 DIAGNOSIS — E870 Hyperosmolality and hypernatremia: Secondary | ICD-10-CM | POA: Diagnosis not present

## 2017-04-22 DIAGNOSIS — N170 Acute kidney failure with tubular necrosis: Secondary | ICD-10-CM | POA: Diagnosis present

## 2017-04-22 DIAGNOSIS — E1151 Type 2 diabetes mellitus with diabetic peripheral angiopathy without gangrene: Secondary | ICD-10-CM | POA: Diagnosis present

## 2017-04-22 DIAGNOSIS — R1310 Dysphagia, unspecified: Secondary | ICD-10-CM | POA: Diagnosis present

## 2017-04-22 DIAGNOSIS — F0280 Dementia in other diseases classified elsewhere without behavioral disturbance: Secondary | ICD-10-CM | POA: Diagnosis present

## 2017-04-22 DIAGNOSIS — J449 Chronic obstructive pulmonary disease, unspecified: Secondary | ICD-10-CM | POA: Diagnosis not present

## 2017-04-22 DIAGNOSIS — R4182 Altered mental status, unspecified: Secondary | ICD-10-CM | POA: Diagnosis not present

## 2017-04-22 DIAGNOSIS — J9690 Respiratory failure, unspecified, unspecified whether with hypoxia or hypercapnia: Secondary | ICD-10-CM | POA: Diagnosis not present

## 2017-04-22 DIAGNOSIS — R0602 Shortness of breath: Secondary | ICD-10-CM

## 2017-04-22 DIAGNOSIS — J9811 Atelectasis: Secondary | ICD-10-CM | POA: Diagnosis not present

## 2017-04-22 DIAGNOSIS — G9340 Encephalopathy, unspecified: Secondary | ICD-10-CM

## 2017-04-22 DIAGNOSIS — I6789 Other cerebrovascular disease: Secondary | ICD-10-CM | POA: Diagnosis not present

## 2017-04-22 DIAGNOSIS — I509 Heart failure, unspecified: Secondary | ICD-10-CM | POA: Diagnosis present

## 2017-04-22 DIAGNOSIS — H409 Unspecified glaucoma: Secondary | ICD-10-CM | POA: Diagnosis present

## 2017-04-22 DIAGNOSIS — L8990 Pressure ulcer of unspecified site, unspecified stage: Secondary | ICD-10-CM | POA: Diagnosis present

## 2017-04-22 DIAGNOSIS — Z515 Encounter for palliative care: Secondary | ICD-10-CM | POA: Diagnosis not present

## 2017-04-22 DIAGNOSIS — J9600 Acute respiratory failure, unspecified whether with hypoxia or hypercapnia: Secondary | ICD-10-CM

## 2017-04-22 DIAGNOSIS — J811 Chronic pulmonary edema: Secondary | ICD-10-CM

## 2017-04-22 DIAGNOSIS — R4020 Unspecified coma: Secondary | ICD-10-CM | POA: Diagnosis not present

## 2017-04-22 DIAGNOSIS — D696 Thrombocytopenia, unspecified: Secondary | ICD-10-CM | POA: Diagnosis present

## 2017-04-22 DIAGNOSIS — R531 Weakness: Secondary | ICD-10-CM | POA: Diagnosis not present

## 2017-04-22 DIAGNOSIS — D638 Anemia in other chronic diseases classified elsewhere: Secondary | ICD-10-CM | POA: Diagnosis present

## 2017-04-22 DIAGNOSIS — Z978 Presence of other specified devices: Secondary | ICD-10-CM

## 2017-04-22 DIAGNOSIS — E872 Acidosis: Secondary | ICD-10-CM | POA: Diagnosis not present

## 2017-04-22 DIAGNOSIS — Z66 Do not resuscitate: Secondary | ICD-10-CM | POA: Diagnosis not present

## 2017-04-22 DIAGNOSIS — E785 Hyperlipidemia, unspecified: Secondary | ICD-10-CM | POA: Diagnosis present

## 2017-04-22 DIAGNOSIS — R627 Adult failure to thrive: Secondary | ICD-10-CM | POA: Diagnosis not present

## 2017-04-22 DIAGNOSIS — S3991XA Unspecified injury of abdomen, initial encounter: Secondary | ICD-10-CM | POA: Diagnosis not present

## 2017-04-22 DIAGNOSIS — N179 Acute kidney failure, unspecified: Secondary | ICD-10-CM

## 2017-04-22 DIAGNOSIS — K5790 Diverticulosis of intestine, part unspecified, without perforation or abscess without bleeding: Secondary | ICD-10-CM | POA: Diagnosis not present

## 2017-04-22 DIAGNOSIS — J9622 Acute and chronic respiratory failure with hypercapnia: Secondary | ICD-10-CM | POA: Diagnosis present

## 2017-04-22 DIAGNOSIS — N184 Chronic kidney disease, stage 4 (severe): Secondary | ICD-10-CM | POA: Diagnosis present

## 2017-04-22 DIAGNOSIS — Z8249 Family history of ischemic heart disease and other diseases of the circulatory system: Secondary | ICD-10-CM

## 2017-04-22 DIAGNOSIS — R778 Other specified abnormalities of plasma proteins: Secondary | ICD-10-CM

## 2017-04-22 DIAGNOSIS — Z9981 Dependence on supplemental oxygen: Secondary | ICD-10-CM

## 2017-04-22 DIAGNOSIS — F0390 Unspecified dementia without behavioral disturbance: Secondary | ICD-10-CM | POA: Diagnosis not present

## 2017-04-22 DIAGNOSIS — T884XXA Failed or difficult intubation, initial encounter: Secondary | ICD-10-CM

## 2017-04-22 DIAGNOSIS — R0902 Hypoxemia: Secondary | ICD-10-CM | POA: Diagnosis not present

## 2017-04-22 DIAGNOSIS — R6521 Severe sepsis with septic shock: Secondary | ICD-10-CM | POA: Diagnosis not present

## 2017-04-22 DIAGNOSIS — F4489 Other dissociative and conversion disorders: Secondary | ICD-10-CM | POA: Diagnosis not present

## 2017-04-22 DIAGNOSIS — D472 Monoclonal gammopathy: Secondary | ICD-10-CM | POA: Diagnosis present

## 2017-04-22 DIAGNOSIS — R0600 Dyspnea, unspecified: Secondary | ICD-10-CM | POA: Diagnosis not present

## 2017-04-22 DIAGNOSIS — Z9911 Dependence on respirator [ventilator] status: Secondary | ICD-10-CM

## 2017-04-22 DIAGNOSIS — Z961 Presence of intraocular lens: Secondary | ICD-10-CM | POA: Diagnosis present

## 2017-04-22 DIAGNOSIS — N189 Chronic kidney disease, unspecified: Secondary | ICD-10-CM

## 2017-04-22 LAB — URINALYSIS, ROUTINE W REFLEX MICROSCOPIC
BACTERIA UA: NONE SEEN
BILIRUBIN URINE: NEGATIVE
Glucose, UA: NEGATIVE mg/dL
KETONES UR: NEGATIVE mg/dL
Leukocytes, UA: NEGATIVE
NITRITE: NEGATIVE
PROTEIN: NEGATIVE mg/dL
Specific Gravity, Urine: 1.009 (ref 1.005–1.030)
pH: 5 (ref 5.0–8.0)

## 2017-04-22 LAB — GLUCOSE, CAPILLARY: Glucose-Capillary: 73 mg/dL (ref 65–99)

## 2017-04-22 LAB — COMPREHENSIVE METABOLIC PANEL
ALT: 55 U/L (ref 17–63)
AST: 90 U/L — ABNORMAL HIGH (ref 15–41)
Albumin: 3.6 g/dL (ref 3.5–5.0)
Alkaline Phosphatase: 41 U/L (ref 38–126)
Anion gap: 11 (ref 5–15)
BUN: 32 mg/dL — ABNORMAL HIGH (ref 6–20)
CHLORIDE: 102 mmol/L (ref 101–111)
CO2: 25 mmol/L (ref 22–32)
Calcium: 9.9 mg/dL (ref 8.9–10.3)
Creatinine, Ser: 3.12 mg/dL — ABNORMAL HIGH (ref 0.61–1.24)
GFR, EST AFRICAN AMERICAN: 20 mL/min — AB (ref >60)
GFR, EST NON AFRICAN AMERICAN: 17 mL/min — AB (ref >60)
Glucose, Bld: 110 mg/dL — ABNORMAL HIGH (ref 65–99)
POTASSIUM: 4.8 mmol/L (ref 3.5–5.1)
SODIUM: 138 mmol/L (ref 135–145)
Total Bilirubin: 0.5 mg/dL (ref 0.3–1.2)
Total Protein: 8.2 g/dL — ABNORMAL HIGH (ref 6.5–8.1)

## 2017-04-22 LAB — CBC WITH DIFFERENTIAL/PLATELET
BASOS PCT: 0 %
Basophils Absolute: 0 10*3/uL (ref 0.0–0.1)
Eosinophils Absolute: 0 10*3/uL (ref 0.0–0.7)
Eosinophils Relative: 0 %
HEMATOCRIT: 31.5 % — AB (ref 39.0–52.0)
HEMOGLOBIN: 10.4 g/dL — AB (ref 13.0–17.0)
LYMPHS ABS: 1.3 10*3/uL (ref 0.7–4.0)
LYMPHS PCT: 16 %
MCH: 32.2 pg (ref 26.0–34.0)
MCHC: 33 g/dL (ref 30.0–36.0)
MCV: 97.5 fL (ref 78.0–100.0)
MONO ABS: 0.9 10*3/uL (ref 0.1–1.0)
Monocytes Relative: 11 %
NEUTROS ABS: 6 10*3/uL (ref 1.7–7.7)
NEUTROS PCT: 73 %
PLATELETS: 196 10*3/uL (ref 150–400)
RBC: 3.23 MIL/uL — ABNORMAL LOW (ref 4.22–5.81)
RDW: 16.1 % — AB (ref 11.5–15.5)
WBC: 8.2 10*3/uL (ref 4.0–10.5)

## 2017-04-22 LAB — MAGNESIUM: Magnesium: 2.1 mg/dL (ref 1.7–2.4)

## 2017-04-22 LAB — LACTIC ACID, PLASMA
LACTIC ACID, VENOUS: 1.2 mmol/L (ref 0.5–1.9)
LACTIC ACID, VENOUS: 1.3 mmol/L (ref 0.5–1.9)

## 2017-04-22 LAB — TROPONIN I
TROPONIN I: 0.14 ng/mL — AB (ref <0.03)
TROPONIN I: 0.25 ng/mL — AB (ref <0.03)

## 2017-04-22 MED ORDER — ASPIRIN 325 MG PO TABS
325.0000 mg | ORAL_TABLET | Freq: Every day | ORAL | Status: DC
  Administered 2017-04-23: 325 mg via ORAL
  Filled 2017-04-22: qty 1

## 2017-04-22 MED ORDER — VITAMIN D 1000 UNITS PO TABS
2000.0000 [IU] | ORAL_TABLET | ORAL | Status: DC
  Administered 2017-04-23: 2000 [IU] via ORAL
  Filled 2017-04-22 (×2): qty 2

## 2017-04-22 MED ORDER — SODIUM CHLORIDE 0.9 % IV BOLUS (SEPSIS)
1000.0000 mL | Freq: Once | INTRAVENOUS | Status: AC
  Administered 2017-04-22: 1000 mL via INTRAVENOUS

## 2017-04-22 MED ORDER — HEPARIN SODIUM (PORCINE) 5000 UNIT/ML IJ SOLN
5000.0000 [IU] | Freq: Three times a day (TID) | INTRAMUSCULAR | Status: DC
  Administered 2017-04-22 – 2017-04-30 (×23): 5000 [IU] via SUBCUTANEOUS
  Filled 2017-04-22 (×23): qty 1

## 2017-04-22 MED ORDER — ASPIRIN 81 MG PO CHEW
324.0000 mg | CHEWABLE_TABLET | Freq: Once | ORAL | Status: AC
  Administered 2017-04-22: 324 mg via ORAL
  Filled 2017-04-22: qty 4

## 2017-04-22 MED ORDER — CALCITRIOL 0.25 MCG PO CAPS
0.5000 ug | ORAL_CAPSULE | Freq: Every day | ORAL | Status: DC
  Administered 2017-04-23 – 2017-04-24 (×2): 0.5 ug via ORAL
  Filled 2017-04-22 (×2): qty 2

## 2017-04-22 MED ORDER — MEMANTINE HCL 10 MG PO TABS
10.0000 mg | ORAL_TABLET | Freq: Two times a day (BID) | ORAL | Status: DC
  Administered 2017-04-23 – 2017-04-24 (×3): 10 mg via ORAL
  Filled 2017-04-22 (×3): qty 1

## 2017-04-22 MED ORDER — SODIUM CHLORIDE 0.9 % IV SOLN
INTRAVENOUS | Status: DC
  Administered 2017-04-22 – 2017-04-23 (×4): via INTRAVENOUS

## 2017-04-22 MED ORDER — ACETAMINOPHEN 650 MG RE SUPP
650.0000 mg | Freq: Four times a day (QID) | RECTAL | Status: DC | PRN
  Administered 2017-04-26: 650 mg via RECTAL
  Filled 2017-04-22: qty 1

## 2017-04-22 MED ORDER — ACETAMINOPHEN 325 MG PO TABS
650.0000 mg | ORAL_TABLET | Freq: Four times a day (QID) | ORAL | Status: DC | PRN
  Administered 2017-04-23: 650 mg via ORAL
  Filled 2017-04-22: qty 2

## 2017-04-22 MED ORDER — IOPAMIDOL (ISOVUE-300) INJECTION 61%
INTRAVENOUS | Status: AC
  Filled 2017-04-22: qty 30

## 2017-04-22 MED ORDER — INFLUENZA VAC SPLIT HIGH-DOSE 0.5 ML IM SUSY: 0.5000 mL | PREFILLED_SYRINGE | INTRAMUSCULAR | Status: DC

## 2017-04-22 MED ORDER — ASPIRIN EC 81 MG PO TBEC
81.0000 mg | DELAYED_RELEASE_TABLET | Freq: Every day | ORAL | Status: DC
  Administered 2017-04-23 – 2017-04-24 (×2): 81 mg via ORAL
  Filled 2017-04-22 (×2): qty 1

## 2017-04-22 NOTE — ED Notes (Signed)
Report given to Troy, RN on 300.

## 2017-04-22 NOTE — ED Provider Notes (Signed)
Emergency Department Provider Note   I have reviewed the triage vital signs and the nursing notes.   HISTORY  Chief Complaint Fall and Weakness   HPI [name] [name] is a 83 y.o. male multiple medical problems as detailed below those recently seen here by myself presents for reevaluation secondary to another fall. Patient states wife supplies the history. She states she's had increasing weakness today after having improved since his most recent visit. He's also decreased urine output and today he fell landing on his bottom and possibly hitting his head. He was stunned for a minute but never had a syncopal event. Patient is able to ambulate after that but then weakness got worse so EMS was called to bring her here for further evaluation. She states that she has these have any pain anywhere. No recent infections. He has not had any other complaints besides increased sleepiness. No fevers, nausea, vomiting, diarrhea and has had some decreased bowel movements but no significant constipation his last bowel movement was this morning.   Past Medical History:  Diagnosis Date  . Alzheimer disease   . Anemia   . BPH (benign prostatic hyperplasia)   . CAD (coronary artery disease)    s/p CABG in 1997 with known atretic LIMA to LAD and 40% LM.    Marland Kitchen Carotid artery stenosis    s/p left CEA.> 20 yrs ago and > 70% right ICA stenosis - followed by Dr. Donnetta Hutching  . Chronic chest wall pain   . Chronic kidney disease (CKD), stage III (moderate) (HCC)   . COPD (chronic obstructive pulmonary disease) ([place])   . Cough    cough with productive white to yellow sputum.  . DEMENTIA    "short term memory issues"  . Depression with anxiety   . GERD (gastroesophageal reflux disease)   . Glaucoma    bilateral  . Hyperlipemia   . Hypertension   . Memory loss   . Monoclonal gammopathy   . Neuromuscular disorder (HCC)    fingertips numbness"  . On home oxygen therapy    at bedtime-2 L/m nasally..    . Peptic  ulcer disease   . Prostatitis   . PVD (peripheral vascular disease) ([place])    s/p left renal artery stent and left iliac stent  . Retinal artery occlusion     Patient Active Problem List   Diagnosis Date Noted  . Dysphagia 12/13/2015  . Anorexia 12/13/2015  . CAP (community acquired pneumonia) 12/13/2015  . History of thrush 12/13/2015  . Hypertension, essential 12/13/2015  . Dementia 12/13/2015  . Upper airway cough syndrome 09/01/2015  . COPD GOLD II 04/26/2015  . Chronic respiratory failure with hypoxia ([place]) 02/25/2015  . Carotid stenosis, asymptomatic 01/25/2015  . Delirium 10/15/2014  . Chronic chest wall pain   . Paresthesia 12/22/2013  . PVD (peripheral vascular disease) ([place])   . GERD (gastroesophageal reflux disease)   . CAD (coronary artery disease)   . Alzheimer disease   . Memory loss   . PAIN IN JOINT, MULTIPLE SITES 04/19/2009  . Allergic rhinitis 08/16/2008  . Dyslipidemia 03/26/2007  . MONOCLONAL GAMMOPATHY 03/26/2007  . CKD (chronic kidney disease) stage 3, GFR 30-59 ml/min ([place]) 03/26/2007    Past Surgical History:  Procedure Laterality Date  . BACK SURGERY  2011   lower back -disc  . CARDIAC SURGERY    . CATARACT EXTRACTION W/PHACO  09/18/2011   Procedure: CATARACT EXTRACTION PHACO AND INTRAOCULAR LENS PLACEMENT (IOC);  Surgeon: Elta Guadeloupe T.  Gershon Crane, MD;  Location: [facility];  Service: Ophthalmology;  Laterality: Right;  CDE: 6.19  . COLONOSCOPY WITH PROPOFOL N/A 07/11/2015   Procedure: COLONOSCOPY WITH PROPOFOL;  Surgeon: Laurence Spates, MD;  Location: [facility];  Service: Endoscopy;  Laterality: N/A;  . CORONARY ARTERY BYPASS GRAFT  1997   "nerve pain at area"  . ESOPHAGOGASTRODUODENOSCOPY (EGD) WITH PROPOFOL N/A 07/11/2015   Procedure: ESOPHAGOGASTRODUODENOSCOPY (EGD) WITH PROPOFOL;  Surgeon: Laurence Spates, MD;  Location: [facility];  Service: Endoscopy;  Laterality: N/A;  . Left CEA  2002   carotid stent-left-   . RENAL ARTERY STENT      Current  Outpatient Rx  . Order #: [id number] Class: Historical Med  . Order #: [id number] Class: Print  . Order #: [id number] Class: OTC  . Order #: [id number] Class: Historical Med  . Order #: [id number] Class: Historical Med  . Order #: [id number] Class: Normal  . Order #: [id number] Class: Print  . Order #: [id number] Class: Normal  . Order #: [id number] Class: Historical Med  . Order #: [id number] Class: Historical Med  . Order #: [id number] Class: Historical Med  . Order #: [id number] Class: Normal  . Order #: [id number] Class: Historical Med  . Order #: [id number] Class: Print  . Order #: [id number] Class: Historical Med  . Order #: [id number] Class: Historical Med  . Order #: [id number] Class: Historical Med  . Order #: [id number] Class: Normal  . Order #: [id number] Class: Historical Med  . Order #: [id number] Class: Normal  . Order #: [id number] Class: Historical Med  . Order #: [id number] Class: Normal  . Order #: [id number] Class: Historical Med  . Order #: [id number] Class: Historical Med  . Order #: [id number] Class: Historical Med  . Order #: [id number] Class: Historical Med  . Order #: [id number] Class: Historical Med  . Order #: [id number] Class: Print  . Order #: [id number] Class: Historical Med    Allergies Patient has no known allergies.  Family History  Problem Relation Age of Onset  . Hypertension Father   . Hypertension Mother   . High blood pressure Unknown   . High Cholesterol Unknown   . Anesthesia problems Neg Hx   . Hypotension Neg Hx   . Malignant hyperthermia Neg Hx   . Pseudochol deficiency Neg Hx     Social History Social History  Substance Use Topics  . Smoking status: Former Smoker    Packs/day: 0.50    Years: 40.00    Types: Cigarettes    Quit date: 07/23/1978  . Smokeless tobacco: Never Used  . Alcohol use No    Review of Systems  All other systems negative except as documented in the HPI. All pertinent positives and negatives as reviewed in the  HPI. ____________________________________________   PHYSICAL EXAM:  VITAL SIGNS: ED Triage Vitals  Enc Vitals Group     BP 04/24/2017 1135 (!) 98/55     Pulse Rate 05/12/2017 1135 68     Resp 04/29/2017 1135 19     Temp 05/13/2017 1135 98 F (36.7 C)     Temp Source 05/22/2017 1135 Oral     SpO2 04/23/2017 1135 (!) 74 %     Weight 05/15/2017 1132 160 lb (72.6 kg)     Height 05/11/2017 1132 5\' 7"  (1.702 m)     Head Circumference --      Peak Flow --      Pain Score --      Pain Loc --      Pain Edu? --      Excl. in [place]? --     Constitutional: Alert. Well appearing and in  no acute distress. Eyes: Conjunctivae are normal. PERRL. EOMI. Head: Atraumatic. Nose: No congestion/rhinnorhea. Mouth/Throat: Mucous membranes are moist.  Oropharynx non-erythematous. Neck: No stridor.  No meningeal signs.   Cardiovascular: Normal rate, regular rhythm. Good peripheral circulation. Grossly normal heart sounds.   Respiratory: Normal respiratory effort.  No retractions. Lungs CTAB. Gastrointestinal: Soft and nontender. Mild distention.  Musculoskeletal: No lower extremity tenderness nor edema. No gross deformities of extremities. Neurologic:  No gross focal neurologic deficits are appreciated.  Skin:  Skin is warm, dry and intact. No rash noted.   ____________________________________________   LABS (all labs ordered are listed, but only abnormal results are displayed)  Labs Reviewed  CBC WITH DIFFERENTIAL/PLATELET - Abnormal; Notable for the following:       Result Value   RBC 3.23 (*)    Hemoglobin 10.4 (*)    HCT 31.5 (*)    RDW 16.1 (*)    All other components within normal limits  COMPREHENSIVE METABOLIC PANEL - Abnormal; Notable for the following:    Glucose, Bld 110 (*)    BUN 32 (*)    Creatinine, Ser 3.12 (*)    Total Protein 8.2 (*)    AST 90 (*)    GFR calc non Af Amer 17 (*)    GFR calc Af Amer 20 (*)    All other components within normal limits  URINALYSIS, ROUTINE W REFLEX  MICROSCOPIC - Abnormal; Notable for the following:    APPearance HAZY (*)    Hgb urine dipstick SMALL (*)    Squamous Epithelial / LPF 0-5 (*)    All other components within normal limits  TROPONIN I - Abnormal; Notable for the following:    Troponin I 0.14 (*)    All other components within normal limits  LACTIC ACID, PLASMA  LACTIC ACID, PLASMA  MAGNESIUM   ____________________________________________  EKG   EKG Interpretation  Date/Time:  Monday April 22 2017 11:36:14 EDT Ventricular Rate:  64 PR Interval:    QRS Duration: 103 QT Interval:  427 QTC Calculation: 441 R Axis:   79 Text Interpretation:  Sinus rhythm Atrial premature complex Consider right ventricular hypertrophy Borderline T abnormalities, anterior leads flattened t waves in V2-3 new Confirmed by Merrily Pew [phone]) on 05/11/2017 1:50:35 PM       ____________________________________________  RADIOLOGY  Dg Chest 2 View  Result Date: 05/21/2017 CLINICAL DATA:  Generalized weakness EXAM: CHEST  2 VIEW COMPARISON:  04/17/2017 FINDINGS: Cardiac shadow is stable. Postoperative changes are again seen. Patchy interstitial changes are again identified throughout both lungs and relatively stable accentuated by poor inspiratory effort. No acute bony abnormality is seen. No sizable effusion is noted. IMPRESSION: Chronic interstitial changes without acute abnormality. Electronically Signed   By: Inez Catalina M.D.   On: 05/19/2017 14:25    ____________________________________________   PROCEDURES  Procedure(s) performed:   Procedures   ____________________________________________   INITIAL IMPRESSION / ASSESSMENT AND PLAN / ED COURSE  Pertinent labs & imaging results that were available during my care of the patient were reviewed by me and considered in my medical decision making (see chart for details).  Dehydration vs infection vs metabolic, will eval appropriately. initialy hypoxia 2/2 not beign on  home O2.   Positive troponin, flattened t waves on ECG that are new. Aspirin given. Will hold on heparin until after scans to ensure no bleed anywhere.   Care transferred pending cts.  ____________________________________________  FINAL CLINICAL IMPRESSION(S) / ED DIAGNOSES  Final diagnoses:  None  MEDICATIONS GIVEN DURING THIS VISIT:  Medications  iopamidol (ISOVUE-300) 61 % injection (not administered)  aspirin chewable tablet 324 mg (not administered)  sodium chloride 0.9 % bolus 1,000 mL (0 mLs Intravenous Stopped 05/04/2017 1353)     NEW OUTPATIENT MEDICATIONS STARTED DURING THIS VISIT:  New Prescriptions   No medications on file    Note:  This document was prepared using Dragon voice recognition software and may include unintentional dictation errors.   Marijah Larranaga, Corene Cornea, MD 04/24/2017 [phone]

## 2017-04-22 NOTE — H&P (Addendum)
History and Physical    MAKAEL STEIN JSE:[id number] DOB: 1930-11-22 DOA: 05/05/2017  Referring MD/NP/PA: Maryan Rued PCP: Antony Contras, MD  Outpatient Specialists:  [facility]  Patient coming from: Home   Chief Complaint: Evonnie Pat, Dehydration, elevated troponin, encephalopathy   HPI: RAFIQ BUCKLIN is a 83 y.o. male with medical history significant of multiple medical issues including Alzheimer disease, coronary artery disease status post CABG in 1997, stage III CAD, COPD presenting with fall, acute on chronic kidney injury, dehydration, elevated troponin, AKI, encephalopathy.  Level V caveat in the setting of encephalopathy baseline dementia. History primarily from patient's wife. Per report, patient with episode of fall at home. No reported weakness or dizziness. Patient fell from standing height. No reported head trauma. Denies any prior episodes of fall in the past so patient has had overall due to difficulty with ambulation and gait. Patient also with progressively decreased appetite as well as abdominal distention. No reported episodes of vomiting or diarrhea. Baseline history of dementia. No reported recent medication changes. Wears oxygen at night in the setting of COPD. No reported increasing O2 demands. No reported chest pain though patient has reported some questionable abdominal discomfort.  ED Course: Presented to the ER afebrile hemodynamically stable. CBC grossly within normal limits. Creatinine 3.12 with baseline creatinine around 2.5. Urinalysis is not indicative of infection. Lactate within normal limits. Troponin 0.14-0.25. Chest x-ray with chronic interstitial lung changes without acute abnormality. CT head within normal limits. CT of the abdomen and pelvis with no acute process in the abdomen and pelvis. Biliary fibrosis and emphysematous change. Bilateral fatty inguinal hernias.  Review of Systems: Limited ROS in setting of encephalopathy   Past Medical  History:  Diagnosis Date  . Alzheimer disease   . Anemia   . BPH (benign prostatic hyperplasia)   . CAD (coronary artery disease)    s/p CABG in 1997 with known atretic LIMA to LAD and 40% LM.    Marland Kitchen Carotid artery stenosis    s/p left CEA.> 20 yrs ago and > 70% right ICA stenosis - followed by Dr. Donnetta Hutching  . Chronic chest wall pain   . Chronic kidney disease (CKD), stage III (moderate) (HCC)   . COPD (chronic obstructive pulmonary disease) ([place])   . Cough    cough with productive white to yellow sputum.  . DEMENTIA    "short term memory issues"  . Depression with anxiety   . GERD (gastroesophageal reflux disease)   . Glaucoma    bilateral  . Hyperlipemia   . Hypertension   . Memory loss   . Monoclonal gammopathy   . Neuromuscular disorder (HCC)    fingertips numbness"  . On home oxygen therapy    at bedtime-2 L/m nasally..    . Peptic ulcer disease   . Prostatitis   . PVD (peripheral vascular disease) ([place])    s/p left renal artery stent and left iliac stent  . Retinal artery occlusion     Past Surgical History:  Procedure Laterality Date  . BACK SURGERY  2011   lower back -disc  . CARDIAC SURGERY    . CATARACT EXTRACTION W/PHACO  09/18/2011   Procedure: CATARACT EXTRACTION PHACO AND INTRAOCULAR LENS PLACEMENT (IOC);  Surgeon: Elta Guadeloupe T. Gershon Crane, MD;  Location: [facility];  Service: Ophthalmology;  Laterality: Right;  CDE: 6.19  . COLONOSCOPY WITH PROPOFOL N/A 07/11/2015   Procedure: COLONOSCOPY WITH PROPOFOL;  Surgeon: Laurence Spates, MD;  Location: [facility];  Service: Endoscopy;  Laterality: N/A;  . CORONARY ARTERY BYPASS GRAFT  1997   "nerve pain at area"  . ESOPHAGOGASTRODUODENOSCOPY (EGD) WITH PROPOFOL N/A 07/11/2015   Procedure: ESOPHAGOGASTRODUODENOSCOPY (EGD) WITH PROPOFOL;  Surgeon: Laurence Spates, MD;  Location: [facility];  Service: Endoscopy;  Laterality: N/A;  . Left CEA  2002   carotid stent-left-   . RENAL ARTERY STENT       reports that he quit smoking  about 38 years ago. His smoking use included Cigarettes. He has a 20.00 pack-year smoking history. He has never used smokeless tobacco. He reports that he does not drink alcohol or use drugs.  No Known Allergies  Family History  Problem Relation Age of Onset  . Hypertension Father   . Hypertension Mother   . High blood pressure Unknown   . High Cholesterol Unknown   . Anesthesia problems Neg Hx   . Hypotension Neg Hx   . Malignant hyperthermia Neg Hx   . Pseudochol deficiency Neg Hx     Prior to Admission medications   Medication Sig Start Date End Date Taking? Authorizing Provider  acetaminophen (TYLENOL) 500 MG tablet Take 1,000 mg by mouth every 6 (six) hours as needed for headache.   Yes [provider]  amoxicillin-clavulanate (AUGMENTIN) 875-125 MG tablet Take 1 tablet by mouth 2 (two) times daily. One po bid x 7 days 04/17/17  Yes Mesner, Corene Cornea, MD  aspirin 81 MG tablet Take 1 tablet (81 mg total) by mouth daily. 03/12/16  Yes Turner, Eber Hong, MD  calcitRIOL (ROCALTROL) 0.5 MCG capsule Take 0.5 mcg by mouth daily. 07/29/14  Yes [provider]  Cholecalciferol (VITAMIN D) 2000 units CAPS Take 1 capsule by mouth every other day.   Yes [provider]  ezetimibe (ZETIA) 10 MG tablet Take 1 tablet (10 mg total) by mouth daily. 10/23/16  Yes Turner, Eber Hong, MD  feeding supplement, ENSURE ENLIVE, (ENSURE ENLIVE) LIQD Take 237 mLs by mouth 2 (two) times daily between meals. 10/16/14  Yes Dhungel, Nishant, MD  fenofibrate 160 MG tablet Take 1 tablet (160 mg total) by mouth daily. 10/23/16  Yes Turner, Eber Hong, MD  ferrous sulfate 325 (65 FE) MG tablet Take 325 mg by mouth every morning.    Yes [provider]  finasteride (PROSCAR) 5 MG tablet Take 5 mg by mouth every morning.    Yes [provider]  fluticasone (FLONASE) 50 MCG/ACT nasal spray Place 2 sprays into both nostrils 2 (two) times daily.    Yes [provider]  gabapentin  (NEURONTIN) 300 MG capsule Take 1 capsule (300 mg total) by mouth 3 (three) times daily. Patient taking differently: Take 300 mg by mouth at bedtime.  02/15/17  Yes Marcial Pacas, MD  ipratropium (ATROVENT) 0.06 % nasal spray Place 1 spray into both nostrils 4 (four) times daily.   Yes [provider]  ipratropium-albuterol (DUONEB) 0.5-2.5 (3) MG/3ML SOLN Take 3 mLs by nebulization every 4 (four) hours as needed. Patient taking differently: Take 3 mLs by nebulization every 4 (four) hours as needed (for shortness of breath).  02/02/16  Yes Tanda Rockers, MD  lansoprazole (PREVACID) 30 MG capsule Take 30 mg by mouth daily.    Yes [provider]  latanoprost (XALATAN) 0.005 % ophthalmic solution Place 1 drop into both eyes at bedtime.  12/28/13  Yes [provider]  lubiprostone (AMITIZA) 24 MCG capsule Take 24 mcg by mouth daily as needed for constipation.    Yes [provider]  memantine (NAMENDA) 10 MG tablet Take 1 tablet (10 mg total) by mouth 2 (two) times daily. 10/18/16  Yes Marcial Pacas, MD  metoCLOPramide (REGLAN) 10 MG tablet Take 10 mg by mouth 2 (two) times daily. Reported on 09/30/2015 11/01/14  Yes [provider]  metoprolol succinate (TOPROL-XL) 25 MG 24 hr tablet TAKE 1 TABLET EVERY DAY 01/29/17  Yes Turner, Eber Hong, MD  Multiple Vitamin (MULTIVITAMIN WITH MINERALS) TABS tablet Take 1 tablet by mouth daily.   Yes [provider]  nitroGLYCERIN (NITROSTAT) 0.4 MG SL tablet Place 1 tablet (0.4 mg total) under the tongue every 5 (five) minutes as needed for chest pain. 12/15/15  Yes Turner, Eber Hong, MD  polyethylene glycol (MIRALAX / GLYCOLAX) packet Take 17 g by mouth daily as needed for moderate constipation.    Yes [provider]  PROCTO-MED HC 2.5 % rectal cream Place 1 application rectally 2 (two) times daily. 12/24/16  Yes [provider]  rosuvastatin (CRESTOR) 40 MG tablet Take 40 mg by mouth every evening. 03/26/17  Yes  [provider]  Tamsulosin HCl (FLOMAX) 0.4 MG CAPS Take 0.8 mg by mouth at bedtime.    Yes [provider]  vitamin B-12 (CYANOCOBALAMIN) 1000 MCG tablet Take 1,000 mcg by mouth daily.   Yes [provider]  metroNIDAZOLE (FLAGYL) 500 MG tablet Take 1 tablet (500 mg total) by mouth 3 (three) times daily. Patient not taking: Reported on 05/21/2017 01/10/17   Dorie Rank, MD  OXYGEN Inhale 2 L into the lungs at bedtime. 2lpm with sleep    [provider]    Physical Exam: Vitals:   05/15/2017 1400 05/21/2017 1645 05/01/2017 1700 04/25/2017 1730  BP: (!) 146/77 (!) 168/91 (!) 171/72 (!) 172/75  Pulse: 68 68 68 67  Resp: 16 18 19 18   Temp:      TempSrc:      SpO2: 99% 100% 100% 100%  Weight:      Height:          Constitutional: NAD, calm, comfortable Vitals:   05/12/2017 1400 04/25/2017 1645 05/19/2017 1700 05/11/2017 1730  BP: (!) 146/77 (!) 168/91 (!) 171/72 (!) 172/75  Pulse: 68 68 68 67  Resp: 16 18 19 18   Temp:      TempSrc:      SpO2: 99% 100% 100% 100%  Weight:      Height:       Eyes: PERRL, lids and conjunctivae normal ENMT: Mucous membranes are moist. Posterior pharynx clear of any exudate or lesions.Normal dentition.  Neck: normal, supple, no masses, no thyromegaly Respiratory: clear to auscultation bilaterally, no wheezing, no crackles. Normal respiratory effort. No accessory muscle use.  Cardiovascular: Regular rate and rhythm, no murmurs / rubs / gallops. No extremity edema. 2+ pedal pulses. No carotid bruits. + sternotomy scarring on anterior chest.  Abdomen: no tenderness, no masses palpated. No hepatosplenomegaly. Bowel sounds positive.  Musculoskeletal: no clubbing / cyanosis. No joint deformity upper and lower extremities. Good ROM, no contractures. Normal muscle tone.  Skin: no rashes, lesions, ulcers. No induration Neurologic: CN 2-12 grossly intact. Sensation intact, DTR normal. Strength 5/5 in all 4.  Psychiatric: Normal judgment and  insight. Alert and oriented x 3. Normal mood.     Labs on Admission: I have personally reviewed following labs and imaging studies  CBC:  Recent Labs Lab 04/17/17 1855 05/02/2017 1220  WBC 5.9 8.2  NEUTROABS 3.1 6.0  HGB 11.1* 10.4*  HCT 33.2* 31.5*  MCV 96.2 97.5  PLT 219 211   Basic Metabolic Panel:  Recent Labs Lab 04/17/17 1855 05/21/2017 1220  NA 138 138  K 4.2 4.8  CL 106 102  CO2 25 25  GLUCOSE 109* 110*  BUN 28* 32*  CREATININE 2.58* 3.12*  CALCIUM 9.8 9.9  MG 2.0 2.1   GFR: Estimated Creatinine Clearance: 16.8 mL/min (A) (by C-G formula based on SCr of 3.12 mg/dL (H)). Liver Function Tests:  Recent Labs Lab 04/17/17 1855 05/09/2017 1220  AST 27 90*  ALT 15* 55  ALKPHOS 30* 41  BILITOT 0.5 0.5  PROT 8.1 8.2*  ALBUMIN 3.6 3.6   No results for input(s): LIPASE, AMYLASE in the last 168 hours.  Recent Labs Lab 04/17/17 1903  AMMONIA 32   Coagulation Profile: No results for input(s): INR, PROTIME in the last 168 hours. Cardiac Enzymes:  Recent Labs Lab 04/17/17 1855 04/28/2017 1351  TROPONINI <0.03 0.14*   BNP (last 3 results) No results for input(s): PROBNP in the last 8760 hours. HbA1C: No results for input(s): HGBA1C in the last 72 hours. CBG: No results for input(s): GLUCAP in the last 168 hours. Lipid Profile: No results for input(s): CHOL, HDL, LDLCALC, TRIG, CHOLHDL, LDLDIRECT in the last 72 hours. Thyroid Function Tests: No results for input(s): TSH, T4TOTAL, FREET4, T3FREE, THYROIDAB in the last 72 hours. Anemia Panel: No results for input(s): VITAMINB12, FOLATE, FERRITIN, TIBC, IRON, RETICCTPCT in the last 72 hours. Urine analysis:    Component Value Date/Time   COLORURINE YELLOW 05/02/2017 1228   APPEARANCEUR HAZY (A) 04/25/2017 1228   LABSPEC 1.009 05/11/2017 1228   LABSPEC 1.020 04/24/2010 1326   PHURINE 5.0 04/29/2017 1228   GLUCOSEU NEGATIVE 05/04/2017 1228   HGBUR SMALL (A) 05/04/2017 1228   BILIRUBINUR NEGATIVE  05/03/2017 1228   BILIRUBINUR Negative 04/24/2010 1326   KETONESUR NEGATIVE 05/09/2017 1228   PROTEINUR NEGATIVE 04/29/2017 1228   UROBILINOGEN 0.2 10/14/2014 2249   NITRITE NEGATIVE 04/26/2017 1228   LEUKOCYTESUR NEGATIVE 05/09/2017 1228   LEUKOCYTESUR Negative 04/24/2010 1326   Sepsis Labs: @LABRCNTIP (procalcitonin:4,lacticidven:4) )No results found for this or any previous visit (from the past 240 hour(s)).   Radiological Exams on Admission: Ct Abdomen Pelvis Wo Contrast  Result Date: 05/17/2017 CLINICAL DATA:  Patient status post fall.  Generalized weakness. EXAM: CT ABDOMEN AND PELVIS WITHOUT CONTRAST TECHNIQUE: Multidetector CT imaging of the abdomen and pelvis was performed following the standard protocol without IV contrast. COMPARISON:  CT abdomen pelvis 01/10/2017. FINDINGS: Lower chest: Cardiomegaly. Re- demonstrated emphysematous change with interstitial fibrosis. No pleural effusion. Hepatobiliary: Liver is normal in size and contour. Gallbladder is decompressed. Small stone in the gallbladder lumen. No intrahepatic or extrahepatic biliary ductal dilatation. Pancreas: Unremarkable Spleen: Unremarkable Adrenals/Urinary Tract: Normal adrenal glands. Re- demonstrated bilateral renal cysts. Kidneys are symmetric in size. No hydronephrosis. Large diverticulum off the posterior left urinary bladder. Stomach/Bowel: Moderate size hiatal hernia. Normal morphology of the stomach. Oral contrast material to the rectum. Normal appendix. Descending and sigmoid colonic diverticulosis. No evidence for acute diverticulitis. No evidence for bowel obstruction. No free fluid or free intraperitoneal air. Vascular/Lymphatic: Normal caliber abdominal aorta. Stent within the left renal artery. Peripheral calcified atherosclerotic plaque involving the abdominal aorta. Bilateral common iliac artery stents. Reproductive: Prostate is enlarged. Other: Left-greater-than-right bilateral fat containing inguinal  hernias. Musculoskeletal: Lumbar spine degenerative changes. No aggressive or acute appearing osseous lesions. IMPRESSION: No acute process within the abdomen or pelvis. Re- demonstrated pulmonary fibrosis and emphysematous change. Bilateral fat containing  inguinal hernias. Electronically Signed   By: Lovey Newcomer M.D.   On: 04/29/2017 17:19   Dg Chest 2 View  Result Date: 05/10/2017 CLINICAL DATA:  Generalized weakness EXAM: CHEST  2 VIEW COMPARISON:  04/17/2017 FINDINGS: Cardiac shadow is stable. Postoperative changes are again seen. Patchy interstitial changes are again identified throughout both lungs and relatively stable accentuated by poor inspiratory effort. No acute bony abnormality is seen. No sizable effusion is noted. IMPRESSION: Chronic interstitial changes without acute abnormality. Electronically Signed   By: Inez Catalina M.D.   On: 05/09/2017 14:25   Ct Head Wo Contrast  Result Date: 05/21/2017 CLINICAL DATA:  Fall EXAM: CT HEAD WITHOUT CONTRAST TECHNIQUE: Contiguous axial images were obtained from the base of the skull through the vertex without intravenous contrast. COMPARISON:  Head CT 06/29/2016 FINDINGS: Brain: No mass lesion, intraparenchymal hemorrhage or extra-axial collection. No evidence of acute cortical infarct. There is periventricular hypoattenuation compatible with chronic microvascular disease. Vascular: No hyperdense vessel or unexpected calcification. Skull: Normal visualized skull base, calvarium and extracranial soft tissues. Sinuses/Orbits: No sinus fluid levels or advanced mucosal thickening. No mastoid effusion. Normal orbits. IMPRESSION: Chronic small vessel disease without acute abnormality. Electronically Signed   By: Ulyses Jarred M.D.   On: 04/27/2017 17:11    EKG: Independently reviewed. NSR  Assessment/Plan Active Problems:   Fall   Acute on chronic kidney failure (HCC)   Elevated troponin   Encephalopathy   Dehydration    1-Fall  -unclear  etiology -no reported head trauma  -no overt neurocardiogenic sxs  -CT head WNL  -pending orthostatics  -IVF hydration  -avoid offending agents   2-Acute on chronic kidney disease  -Cr 3.4 today -Baseline Cr 2.5  -suspect pre renal etiology  -clinically dry on exam  -IVF hydration  -avoid nephrotoxic agents  3-Elevated trop  -trop 0.14-->0.25 -no active CP  -suspect mild cardiac strain in setting of above  -noted baseline hx/o CAD s/p CABG  -ASA  -suspect poor renal clearance may be confounder  -trend trop  -prn NTG -formal cards c/s in am ( Case discussed w/ on call Cards by [facility]; rec am cards c/s)  4-Encephalopathy  -likely multifactorial in setting of above -baseline dementia -CT head stable  -IVF hydration  -follow   5-Chronic resp failure -likely secondary to COPD  -appears stable  -cont home O2  -follow   6-DM  -SSI  -A1C     DVT prophylaxis: sub q heparin   Code Status: Full Code   Family Communication: Wife at bedside   Disposition Plan: pending further evaluation   Consults called: Cards Arlyce Dice- discussed w/ EDP) Admission status: Obs    Deneise Lever MD [facility] Pager [phone]  If 7PM-7AM, please contact night-coverage [email or web address] Password [facility]  04/27/2017, 6:44 PM

## 2017-04-22 NOTE — ED Triage Notes (Signed)
Pt brought in by ems for fall at home.  Family reports generalized weakness for the past few days and was unable to get him out of the floor.

## 2017-04-22 NOTE — ED Notes (Signed)
Date and time results received: 05/04/2017 1432   Test: troponin  Critical Value:0.14  Name of Provider Notified: Mesner  Orders Received? Or Actions Taken?: no new orders at this time

## 2017-04-23 ENCOUNTER — Encounter: Payer: Self-pay | Admitting: Nurse Practitioner

## 2017-04-23 ENCOUNTER — Other Ambulatory Visit: Payer: Self-pay

## 2017-04-23 DIAGNOSIS — J449 Chronic obstructive pulmonary disease, unspecified: Secondary | ICD-10-CM

## 2017-04-23 DIAGNOSIS — N184 Chronic kidney disease, stage 4 (severe): Secondary | ICD-10-CM

## 2017-04-23 DIAGNOSIS — E785 Hyperlipidemia, unspecified: Secondary | ICD-10-CM | POA: Diagnosis not present

## 2017-04-23 DIAGNOSIS — I248 Other forms of acute ischemic heart disease: Secondary | ICD-10-CM | POA: Diagnosis not present

## 2017-04-23 DIAGNOSIS — R531 Weakness: Secondary | ICD-10-CM | POA: Diagnosis not present

## 2017-04-23 DIAGNOSIS — E860 Dehydration: Secondary | ICD-10-CM | POA: Diagnosis not present

## 2017-04-23 DIAGNOSIS — G9340 Encephalopathy, unspecified: Secondary | ICD-10-CM | POA: Diagnosis not present

## 2017-04-23 DIAGNOSIS — I25708 Atherosclerosis of coronary artery bypass graft(s), unspecified, with other forms of angina pectoris: Secondary | ICD-10-CM

## 2017-04-23 DIAGNOSIS — L8990 Pressure ulcer of unspecified site, unspecified stage: Secondary | ICD-10-CM | POA: Insufficient documentation

## 2017-04-23 DIAGNOSIS — N179 Acute kidney failure, unspecified: Secondary | ICD-10-CM | POA: Diagnosis not present

## 2017-04-23 DIAGNOSIS — W19XXXA Unspecified fall, initial encounter: Secondary | ICD-10-CM | POA: Diagnosis not present

## 2017-04-23 DIAGNOSIS — R748 Abnormal levels of other serum enzymes: Secondary | ICD-10-CM | POA: Diagnosis not present

## 2017-04-23 DIAGNOSIS — J9611 Chronic respiratory failure with hypoxia: Secondary | ICD-10-CM | POA: Diagnosis not present

## 2017-04-23 DIAGNOSIS — Z79899 Other long term (current) drug therapy: Secondary | ICD-10-CM

## 2017-04-23 DIAGNOSIS — R627 Adult failure to thrive: Secondary | ICD-10-CM

## 2017-04-23 LAB — COMPREHENSIVE METABOLIC PANEL
ALK PHOS: 35 U/L — AB (ref 38–126)
ALT: 43 U/L (ref 17–63)
AST: 53 U/L — ABNORMAL HIGH (ref 15–41)
Albumin: 3.3 g/dL — ABNORMAL LOW (ref 3.5–5.0)
Anion gap: 11 (ref 5–15)
BUN: 26 mg/dL — ABNORMAL HIGH (ref 6–20)
CALCIUM: 9.1 mg/dL (ref 8.9–10.3)
CO2: 21 mmol/L — AB (ref 22–32)
CREATININE: 2.29 mg/dL — AB (ref 0.61–1.24)
Chloride: 109 mmol/L (ref 101–111)
GFR, EST AFRICAN AMERICAN: 29 mL/min — AB (ref >60)
GFR, EST NON AFRICAN AMERICAN: 25 mL/min — AB (ref >60)
Glucose, Bld: 86 mg/dL (ref 65–99)
Potassium: 4.1 mmol/L (ref 3.5–5.1)
SODIUM: 141 mmol/L (ref 135–145)
Total Bilirubin: 0.7 mg/dL (ref 0.3–1.2)
Total Protein: 7.4 g/dL (ref 6.5–8.1)

## 2017-04-23 LAB — TROPONIN I
Troponin I: 0.19 ng/mL (ref <0.03)
Troponin I: 0.21 ng/mL (ref <0.03)

## 2017-04-23 LAB — CBC
HCT: 31.8 % — ABNORMAL LOW (ref 39.0–52.0)
HEMOGLOBIN: 10.4 g/dL — AB (ref 13.0–17.0)
MCH: 31.9 pg (ref 26.0–34.0)
MCHC: 32.7 g/dL (ref 30.0–36.0)
MCV: 97.5 fL (ref 78.0–100.0)
Platelets: 187 10*3/uL (ref 150–400)
RBC: 3.26 MIL/uL — AB (ref 4.22–5.81)
RDW: 16 % — ABNORMAL HIGH (ref 11.5–15.5)
WBC: 7.7 10*3/uL (ref 4.0–10.5)

## 2017-04-23 MED ORDER — IPRATROPIUM BROMIDE 0.03 % NA SOLN
1.0000 | Freq: Four times a day (QID) | NASAL | Status: DC
  Administered 2017-04-24 (×2): 1 via NASAL
  Filled 2017-04-23: qty 15
  Filled 2017-04-23: qty 30

## 2017-04-23 MED ORDER — GABAPENTIN 300 MG PO CAPS: 300.0000 mg | ORAL_CAPSULE | Freq: Every day | ORAL | Status: AC

## 2017-04-23 MED ORDER — ROSUVASTATIN CALCIUM 20 MG PO TABS
40.0000 mg | ORAL_TABLET | Freq: Every evening | ORAL | Status: DC
  Administered 2017-04-23: 40 mg via ORAL
  Filled 2017-04-23: qty 2

## 2017-04-23 MED ORDER — LATANOPROST 0.005 % OP SOLN
1.0000 [drp] | Freq: Every day | OPHTHALMIC | Status: DC
  Administered 2017-04-23 – 2017-04-29 (×7): 1 [drp] via OPHTHALMIC
  Filled 2017-04-23 (×3): qty 2.5

## 2017-04-23 MED ORDER — METOPROLOL SUCCINATE ER 25 MG PO TB24
25.0000 mg | ORAL_TABLET | Freq: Every day | ORAL | Status: DC
  Administered 2017-04-24: 25 mg via ORAL
  Filled 2017-04-23: qty 1

## 2017-04-23 MED ORDER — PANTOPRAZOLE SODIUM 40 MG PO TBEC
40.0000 mg | DELAYED_RELEASE_TABLET | Freq: Every day | ORAL | Status: DC
  Administered 2017-04-23 – 2017-04-24 (×2): 40 mg via ORAL
  Filled 2017-04-23 (×2): qty 1

## 2017-04-23 MED ORDER — FINASTERIDE 5 MG PO TABS
5.0000 mg | ORAL_TABLET | Freq: Every morning | ORAL | Status: DC
  Administered 2017-04-24: 5 mg via ORAL
  Filled 2017-04-23 (×3): qty 1

## 2017-04-23 MED ORDER — POLYETHYLENE GLYCOL 3350 17 G PO PACK: 17.0000 g | PACK | Freq: Every day | ORAL | Status: DC | PRN

## 2017-04-23 MED ORDER — ASPIRIN 81 MG PO TABS: 81.0000 mg | ORAL_TABLET | Freq: Every day | ORAL | Status: DC

## 2017-04-23 MED ORDER — AMOXICILLIN-POT CLAVULANATE 875-125 MG PO TABS
1.0000 | ORAL_TABLET | Freq: Two times a day (BID) | ORAL | Status: DC
  Administered 2017-04-23: 1 via ORAL
  Filled 2017-04-23: qty 1

## 2017-04-23 MED ORDER — IPRATROPIUM-ALBUTEROL 0.5-2.5 (3) MG/3ML IN SOLN
3.0000 mL | RESPIRATORY_TRACT | Status: DC | PRN
  Administered 2017-04-23 – 2017-04-24 (×2): 3 mL via RESPIRATORY_TRACT
  Filled 2017-04-23 (×2): qty 3

## 2017-04-23 MED ORDER — TAMSULOSIN HCL 0.4 MG PO CAPS
0.8000 mg | ORAL_CAPSULE | Freq: Every day | ORAL | Status: DC
  Administered 2017-04-23: 0.8 mg via ORAL
  Filled 2017-04-23: qty 2

## 2017-04-23 MED ORDER — FLUTICASONE PROPIONATE 50 MCG/ACT NA SUSP
2.0000 | Freq: Two times a day (BID) | NASAL | Status: DC
  Administered 2017-04-23 – 2017-04-24 (×2): 2 via NASAL
  Filled 2017-04-23 (×2): qty 16

## 2017-04-23 MED ORDER — METOCLOPRAMIDE HCL 10 MG PO TABS
10.0000 mg | ORAL_TABLET | Freq: Two times a day (BID) | ORAL | Status: DC
  Administered 2017-04-23 – 2017-04-24 (×2): 10 mg via ORAL
  Filled 2017-04-23 (×2): qty 1

## 2017-04-23 MED ORDER — DIPHENHYDRAMINE HCL 25 MG PO CAPS
25.0000 mg | ORAL_CAPSULE | Freq: Once | ORAL | Status: AC
  Administered 2017-04-23: 25 mg via ORAL
  Filled 2017-04-23: qty 1

## 2017-04-23 MED ORDER — ONDANSETRON HCL 4 MG/2ML IJ SOLN: 4.0000 mg | Freq: Four times a day (QID) | INTRAMUSCULAR | Status: DC | PRN

## 2017-04-23 NOTE — Patient Outreach (Signed)
[facility]) Care Management  04/23/2017  GARRON ELINE [birth year] [id number]   Patient noted to be admitted.  Hospital liaison made aware of admit.  Suggestion to liaison to refer to community nurse for follow up after hospitalization.    Plan: RN Health Coach will notify physician of discipline closure.  [name], RN, MSN [place] [phone]

## 2017-04-23 NOTE — Consult Note (Signed)
   [facility] CM Inpatient Consult   04/23/2017  [name] [name] 1930/10/20 [id number]   Patient is currently active with [facility] for chronic disease management services.  Patient has been engaged by a Research scientist (medical) and will be transferred to [place].  Our community based plan of care has focused on disease management and community resource support.  Patient will receive a post discharge transition of care call and will be evaluated for monthly home visits for assessments and disease process education.  Made Inpatient Case Manager aware that [facility] following. Of note, [facility] Care Management services does not replace or interfere with any services that are needed or arranged by inpatient case management or social work.  For additional questions or referrals please contact:  Klein Willcox RN, [facility] [facility] Liaison  [phone]) [place] [phone]) Toll free office

## 2017-04-23 NOTE — Consult Note (Signed)
Cardiology Consultation:   Patient ID: [name] [name]; [id number]; 07-Oct-1930   Admit date: 05/10/2017 Date of Consult: 04/23/2017  Primary Care Provider: Antony Contras, MD Primary Cardiologist: Fransico Him, MD  Patient Profile:   [name] [name] is a 83 y.o. male with a hx of CAD, s/p CABG 1997, (LIMA to LAD, 40% ostial LM,), Peripheral vascular disease with stent to the left iliac artery and renal artery stenosis status post left renal artery stent, carotid artery stenosis with left CEA, hypertension, dementia, chronic kidney disease, chronic noncardiac chest wall pain, COPD, and dyslipidemia; who is being seen today for the evaluation of  elevated troponin with known history of CADat the request of Dr. Sarajane Jews, Hospitalist.   History of Present Illness:   Mr. Able Presented to the emergency room after sustaining a fall, admitted with acute kidney injury, dehydration, and encephalopathy. Patient has had overall deterioration of his health status although he has multiple medical problems, history of progressively decreasing appetite, abdominal pain and distention, without associated weakness dizziness or near syncope.   His wife states that he has had increasing weakness. She is his main caregiver and assists him with ADLs. She was helping him to the bathroom the last couple of days and saw that his "legs buckled" each time. He uses a walker for ambulation. Over the last 2 days he continued to have legs buckling as he was walking and she would have to find a spot for him to sit down to keep him from falling. Yesterday as he was returning from the bathroom using his walker, he fell due to inability of his legs to hold him. She was concerned that he may have broken a hip or leg and brought into the emergency room.  She states that she has been having ongoing complaints of lower abdominal pain, he has occasional constipation but he takes medication to assist. He has not been eating very much or  drinking very much. His wife states that he has dysphasia and often coughs of food and liquids with very difficult swallowing effort.  On arrival to the emergency room, the patient was found to be hypotensive with a blood pressure 98/55, heart rate 68, O2 sat 74% prior to being placed on oxygen, he was afebrile. Troponin was noted to be elevated at 0.14, with repeat troponin 0.25, and subsequent troponin 0.19 0.21 respectively. EKG revealed normal sinus rhythm, PACs, with nonspecific T-wave abnormalities, flattening, septal leads, and inferior leads, heart rate 64 bpm.  Pertinent labs revealed anemia with a hemoglobin of 10.4 hematocrit 31.5, platelets 196, white blood cells 8.2. Chemistries revealed a creatinine of 3.12, (elevated from 2.58 on 04/17/2017), GFR 17, glucose 110, potassium 4.8. He was found have elevated AST at 90.  CT scan revealed small vessel disease without acute intracranial abnormality, CT of the abdomen revealing no acute process, with chest x-ray revealing no evidence of CHF or pneumonia.  The patient was treated with IV fluids, aspirin 324 mg as needed for further management his wife states that he is really perked up since IV fluid infusion.  Past Medical History:  Diagnosis Date  . Alzheimer disease   . Anemia   . BPH (benign prostatic hyperplasia)   . CAD (coronary artery disease)    s/p CABG in 1997 with known atretic LIMA to LAD and 40% LM.    Marland Kitchen Carotid artery stenosis    s/p left CEA.> 20 yrs ago and > 70% right ICA stenosis - followed by Dr. Donnetta Hutching  .  Chronic chest wall pain   . Chronic kidney disease (CKD), stage III (moderate) (HCC)   . COPD (chronic obstructive pulmonary disease) ([place])   . Cough    cough with productive white to yellow sputum.  . DEMENTIA    "short term memory issues"  . Depression with anxiety   . GERD (gastroesophageal reflux disease)   . Glaucoma    bilateral  . Hyperlipemia   . Hypertension   . Memory loss   . Monoclonal  gammopathy   . Neuromuscular disorder (HCC)    fingertips numbness"  . On home oxygen therapy    at bedtime-2 L/m nasally..    . Peptic ulcer disease   . Prostatitis   . PVD (peripheral vascular disease) ([place])    s/p left renal artery stent and left iliac stent  . Retinal artery occlusion     Past Surgical History:  Procedure Laterality Date  . BACK SURGERY  2011   lower back -disc  . CARDIAC SURGERY    . CATARACT EXTRACTION W/PHACO  09/18/2011   Procedure: CATARACT EXTRACTION PHACO AND INTRAOCULAR LENS PLACEMENT (IOC);  Surgeon: Elta Guadeloupe T. Gershon Crane, MD;  Location: [facility];  Service: Ophthalmology;  Laterality: Right;  CDE: 6.19  . COLONOSCOPY WITH PROPOFOL N/A 07/11/2015   Procedure: COLONOSCOPY WITH PROPOFOL;  Surgeon: Laurence Spates, MD;  Location: [facility];  Service: Endoscopy;  Laterality: N/A;  . CORONARY ARTERY BYPASS GRAFT  1997   "nerve pain at area"  . ESOPHAGOGASTRODUODENOSCOPY (EGD) WITH PROPOFOL N/A 07/11/2015   Procedure: ESOPHAGOGASTRODUODENOSCOPY (EGD) WITH PROPOFOL;  Surgeon: Laurence Spates, MD;  Location: [facility];  Service: Endoscopy;  Laterality: N/A;  . Left CEA  2002   carotid stent-left-   . RENAL ARTERY STENT       Home Medications:  Prior to Admission medications   Medication Sig Start Date End Date Taking? Authorizing Provider  acetaminophen (TYLENOL) 500 MG tablet Take 1,000 mg by mouth every 6 (six) hours as needed for headache.   Yes [provider]  amoxicillin-clavulanate (AUGMENTIN) 875-125 MG tablet Take 1 tablet by mouth 2 (two) times daily. One po bid x 7 days 04/17/17  Yes Mesner, Corene Cornea, MD  aspirin 81 MG tablet Take 1 tablet (81 mg total) by mouth daily. 03/12/16  Yes Turner, Eber Hong, MD  calcitRIOL (ROCALTROL) 0.5 MCG capsule Take 0.5 mcg by mouth daily. 07/29/14  Yes [provider]  Cholecalciferol (VITAMIN D) 2000 units CAPS Take 1 capsule by mouth every other day.   Yes [provider]  ezetimibe (ZETIA) 10 MG  tablet Take 1 tablet (10 mg total) by mouth daily. 10/23/16  Yes Turner, Eber Hong, MD  feeding supplement, ENSURE ENLIVE, (ENSURE ENLIVE) LIQD Take 237 mLs by mouth 2 (two) times daily between meals. 10/16/14  Yes Dhungel, Nishant, MD  fenofibrate 160 MG tablet Take 1 tablet (160 mg total) by mouth daily. 10/23/16  Yes Turner, Eber Hong, MD  ferrous sulfate 325 (65 FE) MG tablet Take 325 mg by mouth every morning.    Yes [provider]  finasteride (PROSCAR) 5 MG tablet Take 5 mg by mouth every morning.    Yes [provider]  fluticasone (FLONASE) 50 MCG/ACT nasal spray Place 2 sprays into both nostrils 2 (two) times daily.    Yes [provider]  gabapentin (NEURONTIN) 300 MG capsule Take 1 capsule (300 mg total) by mouth 3 (three) times daily. Patient taking differently: Take 300 mg by mouth at bedtime.  02/15/17  Yes Marcial Pacas, MD  ipratropium (ATROVENT) 0.06 % nasal spray Place 1 spray into both nostrils 4 (four) times daily.   Yes [provider]  ipratropium-albuterol (DUONEB) 0.5-2.5 (3) MG/3ML SOLN Take 3 mLs by nebulization every 4 (four) hours as needed. Patient taking differently: Take 3 mLs by nebulization every 4 (four) hours as needed (for shortness of breath).  02/02/16  Yes Tanda Rockers, MD  lansoprazole (PREVACID) 30 MG capsule Take 30 mg by mouth daily.    Yes [provider]  latanoprost (XALATAN) 0.005 % ophthalmic solution Place 1 drop into both eyes at bedtime.  12/28/13  Yes [provider]  lubiprostone (AMITIZA) 24 MCG capsule Take 24 mcg by mouth daily as needed for constipation.    Yes [provider]  memantine (NAMENDA) 10 MG tablet Take 1 tablet (10 mg total) by mouth 2 (two) times daily. 10/18/16  Yes Marcial Pacas, MD  metoCLOPramide (REGLAN) 10 MG tablet Take 10 mg by mouth 2 (two) times daily. Reported on 09/30/2015 11/01/14  Yes [provider]  metoprolol succinate (TOPROL-XL) 25 MG 24 hr tablet TAKE 1  TABLET EVERY DAY 01/29/17  Yes Turner, Eber Hong, MD  Multiple Vitamin (MULTIVITAMIN WITH MINERALS) TABS tablet Take 1 tablet by mouth daily.   Yes [provider]  nitroGLYCERIN (NITROSTAT) 0.4 MG SL tablet Place 1 tablet (0.4 mg total) under the tongue every 5 (five) minutes as needed for chest pain. 12/15/15  Yes Turner, Eber Hong, MD  polyethylene glycol (MIRALAX / GLYCOLAX) packet Take 17 g by mouth daily as needed for moderate constipation.    Yes [provider]  PROCTO-MED HC 2.5 % rectal cream Place 1 application rectally 2 (two) times daily. 12/24/16  Yes [provider]  rosuvastatin (CRESTOR) 40 MG tablet Take 40 mg by mouth every evening. 03/26/17  Yes [provider]  Tamsulosin HCl (FLOMAX) 0.4 MG CAPS Take 0.8 mg by mouth at bedtime.    Yes [provider]  vitamin B-12 (CYANOCOBALAMIN) 1000 MCG tablet Take 1,000 mcg by mouth daily.   Yes [provider]  metroNIDAZOLE (FLAGYL) 500 MG tablet Take 1 tablet (500 mg total) by mouth 3 (three) times daily. Patient not taking: Reported on 05/10/2017 01/10/17   Dorie Rank, MD  OXYGEN Inhale 2 L into the lungs at bedtime. 2lpm with sleep    [provider]    Inpatient Medications: Scheduled Meds: . aspirin EC  81 mg Oral Daily  . aspirin  325 mg Oral Daily  . calcitRIOL  0.5 mcg Oral Daily  . cholecalciferol  2,000 Units Oral QODAY  . heparin  5,000 Units Subcutaneous Q8H  . memantine  10 mg Oral BID   Continuous Infusions: . sodium chloride 100 mL/hr at 04/23/17 0746   PRN Meds: acetaminophen **OR** acetaminophen  Allergies:   No Known Allergies  Social History:   Social History   Social History  . Marital status: Married    Spouse name: Peter Congo  . Number of children: 8  . Years of education: 65   Occupational History  .  Retired    Retired   Social History Main Topics  . Smoking status: Former Smoker    Packs/day: 0.50    Years: 40.00    Types: Cigarettes     Quit date: 07/23/1978  . Smokeless tobacco: Never Used  . Alcohol use No  . Drug use: No  . Sexual activity: Not on file   Other Topics Concern  .  Not on file   Social History Narrative   Patient lives at home with his wife. Peter Congo). Patient is retired. Patient has one Year of college.   Right handed.   Caffeine- sometimes    Family History:    Family History  Problem Relation Age of Onset  . Hypertension Father   . Hypertension Mother   . High blood pressure Unknown   . High Cholesterol Unknown   . Anesthesia problems Neg Hx   . Hypotension Neg Hx   . Malignant hyperthermia Neg Hx   . Pseudochol deficiency Neg Hx      ROS:  Please see the history of present illness.  ROS  All other ROS reviewed and negative.     Physical Exam/Data:   Vitals:   04/24/2017 1930 04/26/2017 2000 05/19/2017 2058 04/23/17 0705  BP: (!) 171/83 (!) 145/88 (!) 172/74 133/68  Pulse: 69 69 71 90  Resp: 17 18 16 16   Temp:   98.4 F (36.9 C) 98.5 F (36.9 C)  TempSrc:   Axillary Axillary  SpO2: 100% 100% 97% 95%  Weight:   172 lb 3.2 oz (78.1 kg)   Height:   5\' 7"  (1.702 m)     Intake/Output Summary (Last 24 hours) at 04/23/17 0830 Last data filed at 05/19/2017 1353  Gross per 24 hour  Intake             1000 ml  Output                0 ml  Net             1000 ml   Filed Weights   05/09/2017 1132 05/10/2017 2058  Weight: 160 lb (72.6 kg) 172 lb 3.2 oz (78.1 kg)   Body mass index is 26.97 kg/m.  General:  Well nourished, well developed, in no acute distress. HEENT: normal Lymph: no adenopathy Neck: no JVD Endocrine:  No thryomegaly Vascular: Bilateral  carotid bruits; FA pulses 2+ bilaterally without bruits  Cardiac:  normal S1, S2; RRR; 1/6 systolic murmur, occasional irregular systole.  Lungs:  Some inspiratory crackles, diminished in the bases, but poor inspiratory effort. No coughing currently.  Abd: soft, nontender, no hepatomegaly  Ext: no edema Musculoskeletal:  No deformities,  BUE and BLE strength diminished overall.  Skin: warm and dry  Neuro:  CNs 2-12 intact, no focal abnormalities noted Psych:  Confused but cooperative and responsive to questions.. Denies pain currently.   EKG:  The EKG was personally reviewed and demonstrates:  NSR with PAC's and non-specific T wave abnormalities inferior and septal.   Telemetry:  Telemetry was personally reviewed and demonstrates: NSR, rates in the 60's to 70's.   Relevant CV Studies: Echocardiogram 10/03/2016 Left ventricle: The cavity size was normal. Wall thickness was increased in a pattern of mild LVH. Systolic function was normal. The estimated ejection fraction was in the range of 50% to 55%. Wall motion was normal; there were no regional wall motion abnormalities. Doppler parameters are consistent with abnormal left ventricular relaxation (grade 1 diastolic dysfunction). - Aortic valve: There was mild regurgitation. - Mitral valve: There was mild regurgitation. - Left atrium: The atrium was moderately dilated. - Pulmonary arteries: Systolic pressure was moderately increased. PA peak pressure: 46 mm Hg (S).  NM Stress Test 10/05/2014   Per note, low risk    Laboratory Data:  Chemistry Recent Labs Lab 04/17/17 1855 05/02/2017 1220 04/23/17 0623  NA 138 138 141  K 4.2 4.8 4.1  CL 106 102 109  CO2 25 25 21*  GLUCOSE 109* 110* 86  BUN 28* 32* 26*  CREATININE 2.58* 3.12* 2.29*  CALCIUM 9.8 9.9 9.1  GFRNONAA 21* 17* 25*  GFRAA 25* 20* 29*  ANIONGAP 7 11 11      Recent Labs Lab 04/17/17 1855 05/11/2017 1220 04/23/17 0623  PROT 8.1 8.2* 7.4  ALBUMIN 3.6 3.6 3.3*  AST 27 90* 53*  ALT 15* 55 43  ALKPHOS 30* 41 35*  BILITOT 0.5 0.5 0.7   Hematology Recent Labs Lab 04/17/17 1855 05/03/2017 1220 04/23/17 0623  WBC 5.9 8.2 7.7  RBC 3.45* 3.23* 3.26*  HGB 11.1* 10.4* 10.4*  HCT 33.2* 31.5* 31.8*  MCV 96.2 97.5 97.5  MCH 32.2 32.2 31.9  MCHC 33.4 33.0 32.7  RDW 15.5 16.1* 16.0*  PLT  219 196 187   Cardiac Enzymes Recent Labs Lab 04/17/17 1855 05/14/2017 1351 04/23/2017 1720 04/23/17 0105 04/23/17 0623  TROPONINI <0.03 0.14* 0.25* 0.19* 0.21*   BNP Recent Labs Lab 04/17/17 1855  BNP 258.0*     Radiology/Studies:  Ct Abdomen Pelvis Wo Contrast  Result Date: 05/11/2017 CLINICAL DATA:  Patient status post fall.  Generalized weakness. EXAM: CT ABDOMEN AND PELVIS WITHOUT CONTRAST TECHNIQUE: Multidetector CT imaging of the abdomen and pelvis was performed following the standard protocol without IV contrast. COMPARISON:  CT abdomen pelvis 01/10/2017. FINDINGS: Lower chest: Cardiomegaly. Re- demonstrated emphysematous change with interstitial fibrosis. No pleural effusion. Hepatobiliary: Liver is normal in size and contour. Gallbladder is decompressed. Small stone in the gallbladder lumen. No intrahepatic or extrahepatic biliary ductal dilatation. Pancreas: Unremarkable Spleen: Unremarkable Adrenals/Urinary Tract: Normal adrenal glands. Re- demonstrated bilateral renal cysts. Kidneys are symmetric in size. No hydronephrosis. Large diverticulum off the posterior left urinary bladder. Stomach/Bowel: Moderate size hiatal hernia. Normal morphology of the stomach. Oral contrast material to the rectum. Normal appendix. Descending and sigmoid colonic diverticulosis. No evidence for acute diverticulitis. No evidence for bowel obstruction. No free fluid or free intraperitoneal air. Vascular/Lymphatic: Normal caliber abdominal aorta. Stent within the left renal artery. Peripheral calcified atherosclerotic plaque involving the abdominal aorta. Bilateral common iliac artery stents. Reproductive: Prostate is enlarged. Other: Left-greater-than-right bilateral fat containing inguinal hernias. Musculoskeletal: Lumbar spine degenerative changes. No aggressive or acute appearing osseous lesions. IMPRESSION: No acute process within the abdomen or pelvis. Re- demonstrated pulmonary fibrosis and  emphysematous change. Bilateral fat containing inguinal hernias. Electronically Signed   By: Lovey Newcomer M.D.   On: 04/28/2017 17:19   Dg Chest 2 View  Result Date: 05/19/2017 CLINICAL DATA:  Generalized weakness EXAM: CHEST  2 VIEW COMPARISON:  04/17/2017 FINDINGS: Cardiac shadow is stable. Postoperative changes are again seen. Patchy interstitial changes are again identified throughout both lungs and relatively stable accentuated by poor inspiratory effort. No acute bony abnormality is seen. No sizable effusion is noted. IMPRESSION: Chronic interstitial changes without acute abnormality. Electronically Signed   By: Inez Catalina M.D.   On: 05/16/2017 14:25   Ct Head Wo Contrast  Result Date: 05/21/2017 CLINICAL DATA:  Fall EXAM: CT HEAD WITHOUT CONTRAST TECHNIQUE: Contiguous axial images were obtained from the base of the skull through the vertex without intravenous contrast. COMPARISON:  Head CT 06/29/2016 FINDINGS: Brain: No mass lesion, intraparenchymal hemorrhage or extra-axial collection. No evidence of acute cortical infarct. There is periventricular hypoattenuation compatible with chronic microvascular disease. Vascular: No hyperdense vessel or unexpected calcification. Skull: Normal visualized skull base, calvarium and extracranial soft tissues. Sinuses/Orbits: No sinus fluid levels or advanced mucosal  thickening. No mastoid effusion. Normal orbits. IMPRESSION: Chronic small vessel disease without acute abnormality. Electronically Signed   By: Ulyses Jarred M.D.   On: 05/03/2017 17:11    Assessment and Plan:   1. Demand Ischemia: Troponin elevation multifactorial in the setting of dehydration, AKI, and anorexia. Falling may have also contributed, but no significant elevation as a result of this. He is without chest pain. EKG does not reveal acute changes. Due to multiple medical problems do not plan ischemic testing at this time. Continue to follow.   2. CAD: Known history of CABG. Stress  test in 2016 low risk per Dr. Theodosia Blender note. Currently not on BB on review of home medications, nor is he on ACE or ARB. On ASA only.   3. PAD: Hx of carotid endarterectomy and stent to left iliac artery with renal artery stenosis. Sees Dr. Sherren Mocha Early for this.   4. AKI: Improved with IV hydration, creatinine now 2.29 compared to 3.12 on admission. As stated above, he is not on ACE or ARB on home medication list. Likely from dehydration, due anorexia and dysphagia.   5. Hypertension: Initially hypotensive with improvement.    For questions or updates, please contact [place] Please consult [email or web address] for contact info under Cardiology/STEMI.   Signed, Jory Sims, NP  04/23/2017 8:30 AM   The patient was seen and examined, and I agree with the history, physical exam, assessment and plan as documented above, with modifications as noted below. I have also personally reviewed all relevant documentation, old records, labs, and both radiographic and cardiovascular studies. I have also independently interpreted old and new ECG's.  83 yr old male with aforementioned medical history who was admitted for weakness and fall. Creatinine elevated from his baseline on admission (3.12). It is now decreasing with IV fluids (now 2.29). His wife says he does not drink much water. He has dysphagia for solids and his wife gives him pureed foods and thickened liquids.  The patient was walking behind his wife after using the bathroom and then his wife heard a "thump" and saw him on the ground. He denies loss of consciousness and antecedent chest pain and shortness of breath.  ECG shows sinus rhythm with nonspecific precordial T wave abnormalities. Chest xray without acute cardiopulmonary disease.  Troponins minimally elevated, peaking at 0.25, down to 0.21.  Recommendations: Fall likely precipitated by generalized weakness due to relative dehydration. Wife notes patient does not drink much  water. Creatinine returning to baseline with IV fluids. I do not think this was precipitated by a cardiac etiology. He also has a considerable amount of polypharmacy given his age. I would aim to reduce and/or eliminate some of his meds, which can certainly be contributing to his symptoms.  No further recommendations. Will sign off.   Kate Sable, MD, [facility]  04/23/2017 10:59 AM

## 2017-04-23 NOTE — Progress Notes (Signed)
CRITICAL VALUE ALERT  Critical Value: Trop: 0.21  Date & Time Notied: 04/23/17 0715  Provider Notified: Sarajane Jews  Orders Received/Actions taken: None at this time

## 2017-04-23 NOTE — Progress Notes (Signed)
PROGRESS NOTE  [name] [name] URK:[id number] DOB: 10/19/1930 DOA: 05/01/2017 PCP: Antony Contras, MD  Brief Narrative: 83 year old man PMH Alzheimer's dementia, coronary artery disease status post CABG 1997, COPD, presented after a fall at home. No reported head trauma. Progressively decreased appetite, abdominal distention. Admitted for further evaluation of fall, acute kidney injury, elevated troponin  Assessment/Plan Acute kidney injury superimposed on chronic kidney disease stage IV -Approaching baseline. Continue IV fluids.  Failure to thrive, poor oral intake. Chronic dysphagia per wife, thickened liquids, chopped meats.   Lower abdominal pain? H/o constipation. -Asymptomatic today. CT abd/pelvis NAD, LFTs notable only for 2x normal AST, trending down. -Miralax as needed  Demand ischemia in the setting of dehydration, acute kidney injury, anorexia. -EKG nonacute. Troponins flat. No ischemic testing plan per cardiology.  Fall at home, no reported head trauma. CT head negative. Likely secondary to generalized weakness, dehydration.  Coronary artery disease, status post CABG 1997  COPD, chronic hypoxic respiratory failure, nocturnal hypoxia and uses oxygen at home. -stable  Alzheimer's dementia   Overall improved. Continue IV fluids. Consult social work for consideration of skilled nursing facility placement assessment. Anticipate discharge 10/3.  DVT prophylaxis: heparin Code Status: full Family Communication: wife at bedside Disposition Plan: pending    [name], MD  [facility] Direct contact: [phone] --[place] [place]  --[email or web address]; password [email or web address]  7PM-7AM contact night coverage as above 04/23/2017, 5:28 PM  LOS: 0 days   Consultants:  Cardiology   Procedures:    Antimicrobials:    Interval history/Subjective: Patient has dementia but feels fine and has no complaints. Wife reports he looks much better today. He is far more awake.  She reports that he has chronic dysphagia which has been previously treated and was also treated with physical therapy recently.  However after working with physical therapy became short of breath and vomited. Physical therapy recommended skilled nursing facility placement and the wife is interested in this.  Objective: Vitals: Afebrile, 98.5, 16, 90, 133/68   Exam:     Constitutional: Appears calm, comfortable.  Respiratory. Clear to auscultation bilaterally. No wheezes, rales or rhonchi. Normal respiratory effort.  Cardiovascular. Regular rate and rhythm. No murmur, rub or gallop.  Musculoskeletal. Moves all extremities to command.  Psychiatric. Confused but follows simple commands. Awake. Alert.   I have personally reviewed the following:   Labs:   creatinine 3.12 >> 2.29  AST 90 >> 53  Troponins flat, most recent .21   Hgb stable 10.4  U/A equivocal  Imaging studies:  CT abd/pelvis: NAD   CXR chronic interstitial changes  Medical tests:  EKG SR, NSST changes       Scheduled Meds: . amoxicillin-clavulanate  1 tablet Oral BID  . aspirin EC  81 mg Oral Daily  . calcitRIOL  0.5 mcg Oral Daily  . cholecalciferol  2,000 Units Oral QODAY  . [START ON 04/24/2017] finasteride  5 mg Oral q morning - 10a  . fluticasone  2 spray Each Nare BID  . heparin  5,000 Units Subcutaneous Q8H  . ipratropium  1 spray Each Nare QID  . latanoprost  1 drop Both Eyes QHS  . memantine  10 mg Oral BID  . metoCLOPramide  10 mg Oral BID  . [START ON 04/24/2017] metoprolol succinate  25 mg Oral Daily  . pantoprazole  40 mg Oral Daily  . rosuvastatin  40 mg Oral QPM  . tamsulosin  0.8 mg Oral QHS   Continuous Infusions: . sodium chloride 100  mL/hr at 04/23/17 0746    Active Problems:   Chronic respiratory failure with hypoxia (HCC)   COPD GOLD II   Fall   Pressure injury of skin   AKI (acute kidney injury) ([place])   CKD (chronic kidney disease), stage IV (HCC)   FTT  (failure to thrive) in adult   Demand ischemia ([place])   LOS: 0 days

## 2017-04-23 NOTE — Care Management Obs Status (Signed)
[facility] NOTIFICATION   Patient Details  Name: DESIDERIO DOLATA MRN: [id number] Date of Birth: Jan 25, 1931   Medicare Observation Status Notification Given:  Yes    Sherald Barge, RN 04/23/2017, 11:28 AM

## 2017-04-23 NOTE — Evaluation (Signed)
Physical Therapy Evaluation Patient Details Name: [name] [name] MRN: [id number] DOB: 05-Jan-1931 Today's Date: 04/23/2017   History of Present Illness  [name] [name] is a 83 y.o. male with medical history significant of multiple medical issues including Alzheimer disease, coronary artery disease status post CABG in 1997, stage III CAD, COPD presenting with fall, acute on chronic kidney injury, dehydration, elevated troponin, AKI, encephalopathy.  Level V caveat in the setting of encephalopathy baseline dementia. History primarily from patient's wife. Per report, patient with episode of fall at home. No reported weakness or dizziness. Patient fell from standing height. No reported head trauma. Denies any prior episodes of fall in the past so patient has had overall due to difficulty with ambulation and gait. Patient also with progressively decreased appetite as well as abdominal distention. No reported episodes of vomiting or diarrhea. Baseline history of dementia. No reported recent medication changes. Wears oxygen at night in the setting of COPD. No reported increasing O2 demands. No reported chest pain though patient has reported some questionable abdominal discomfort.    Clinical Impression  Patient demonstrated poor tolerance for gait and out of bed due to SOB, generalized weakness, and episode of vomiting after walking back to bedside.  Patient limited for functional mobility as stated below secondary to BLE weakness, fatigue and poor standing balance.  Patient will benefit from continued physical therapy in hospital and recommended venue below to increase strength, balance, endurance for safe ADLs and gait.  Patient will also benefit from use of wheelchair.  Patient suffers from COPD which impairs his ability to perform daily activities like ambulation, household and community ADLs..  A walker alone will not resolve the issues with performing activities of daily living. A wheelchair will allow patient  to safely perform daily activities.  The patient can self propel in the home or has a caregiver who can provide assistance.       Follow Up Recommendations SNF;Supervision/Assistance - 24 hour    Equipment Recommendations  Wheelchair (measurements PT);Wheelchair cushion (measurements PT)    Recommendations for Other Services       Precautions / Restrictions Precautions Precautions: Fall Restrictions Weight Bearing Restrictions: No      Mobility  Bed Mobility Overal bed mobility: Needs Assistance Bed Mobility: Supine to Sit;Sit to Supine     Supine to sit: Mod assist Sit to supine: Mod assist      Transfers Overall transfer level: Needs assistance Equipment used: Rolling walker (2 wheeled) Transfers: Sit to/from Stand Sit to Stand: Min assist;Mod assist            Ambulation/Gait Ambulation/Gait assistance: Mod assist;Min assist Ambulation Distance (Feet): 15 Feet Assistive device: Rolling walker (2 wheeled) Gait Pattern/deviations: Decreased step length - right;Decreased step length - left;Decreased stride length   Gait velocity interpretation: Below normal speed for age/gender General Gait Details: Patient demonstrates slow labored movement with frequent standing rest breaks due to SOB, c/o fatigue  Stairs            Wheelchair Mobility    Modified Rankin (Stroke Patients Only)       Balance Overall balance assessment: Needs assistance Sitting-balance support: Feet supported;No upper extremity supported Sitting balance-Leahy Scale: Fair     Standing balance support: Bilateral upper extremity supported;During functional activity Standing balance-Leahy Scale: Poor                               Pertinent Vitals/Pain Pain Assessment:  No/denies pain    Home Living Family/patient expects to be discharged to:: Private residence Living Arrangements: Spouse/significant other Available Help at Discharge: Family Type of Home:  Apartment Home Access: Level entry     Home Layout: Multi-level Home Equipment: Environmental consultant - 2 wheels      Prior Function Level of Independence: Independent with assistive device(s)         Comments: patient supervised by spouse for household ambulation with RW     Hand Dominance        Extremity/Trunk Assessment   Upper Extremity Assessment Upper Extremity Assessment: Generalized weakness    Lower Extremity Assessment Lower Extremity Assessment: Generalized weakness    Cervical / Trunk Assessment Cervical / Trunk Assessment: Kyphotic  Communication   Communication: No difficulties  Cognition Arousal/Alertness: Awake/alert Behavior During Therapy: WFL for tasks assessed/performed Overall Cognitive Status: Within Functional Limits for tasks assessed                                        General Comments      Exercises     Assessment/Plan    PT Assessment Patient needs continued PT services  PT Problem List Decreased strength;Decreased activity tolerance;Decreased balance;Decreased mobility       PT Treatment Interventions Gait training;Functional mobility training;Therapeutic activities;Therapeutic exercise;Patient/family education    PT Goals (Current goals can be found in the Care Plan section)  Acute Rehab PT Goals Patient Stated Goal: return home with spouse to assist PT Goal Formulation: With patient Time For Goal Achievement: 2017/05/28 Potential to Achieve Goals: Good    Frequency Min 3X/week   Barriers to discharge        Co-evaluation               AM-PAC PT "6 Clicks" Daily Activity  Outcome Measure Difficulty turning over in bed (including adjusting bedclothes, sheets and blankets)?: A Little Difficulty moving from lying on back to sitting on the side of the bed? : A Lot Difficulty sitting down on and standing up from a chair with arms (e.g., wheelchair, bedside commode, etc,.)?: A Lot Help needed moving to and  from a bed to chair (including a wheelchair)?: A Lot Help needed walking in hospital room?: A Lot Help needed climbing 3-5 steps with a railing? : A Lot 6 Click Score: 13    End of Session   Activity Tolerance: Patient limited by fatigue;Patient limited by lethargy (Patient had episode of vomiting once seated at bedside - RN notified.) Patient left: in bed;with call bell/phone within reach;with family/visitor present Nurse Communication: Mobility status PT Visit Diagnosis: Unsteadiness on feet (R26.81);Other abnormalities of gait and mobility (R26.89);Muscle weakness (generalized) (M62.81)    Time: 1829-9371 PT Time Calculation (min) (ACUTE ONLY): 35 min   Charges:   PT Evaluation $PT Eval Moderate Complexity: 1 Mod PT Treatments $Therapeutic Activity: 23-37 mins   PT G Codes:   PT G-Codes **NOT FOR INPATIENT CLASS** Functional Assessment Tool Used: AM-PAC 6 Clicks Basic Mobility Functional Limitation: Mobility: Walking and moving around Mobility: Walking and Moving Around Current Status ([id number]): At least 40 percent but less than 60 percent impaired, limited or restricted Mobility: Walking and Moving Around Goal Status [phone]): At least 40 percent but less than 60 percent impaired, limited or restricted Mobility: Walking and Moving Around Discharge Status [phone]): At least 40 percent but less than 60 percent impaired, limited or restricted  3:41 PM, 04/23/17 Lonell Grandchild, MPT Physical Therapist with [facility] [phone] office [phone] mobile phone

## 2017-04-24 ENCOUNTER — Ambulatory Visit: Payer: Self-pay

## 2017-04-24 ENCOUNTER — Inpatient Hospital Stay (HOSPITAL_COMMUNITY): Payer: Medicare HMO

## 2017-04-24 ENCOUNTER — Observation Stay (HOSPITAL_COMMUNITY): Payer: Medicare HMO

## 2017-04-24 DIAGNOSIS — N400 Enlarged prostate without lower urinary tract symptoms: Secondary | ICD-10-CM | POA: Diagnosis present

## 2017-04-24 DIAGNOSIS — J439 Emphysema, unspecified: Secondary | ICD-10-CM | POA: Diagnosis present

## 2017-04-24 DIAGNOSIS — E860 Dehydration: Secondary | ICD-10-CM | POA: Diagnosis present

## 2017-04-24 DIAGNOSIS — H409 Unspecified glaucoma: Secondary | ICD-10-CM | POA: Diagnosis present

## 2017-04-24 DIAGNOSIS — A419 Sepsis, unspecified organism: Secondary | ICD-10-CM | POA: Diagnosis not present

## 2017-04-24 DIAGNOSIS — R0600 Dyspnea, unspecified: Secondary | ICD-10-CM | POA: Diagnosis not present

## 2017-04-24 DIAGNOSIS — N179 Acute kidney failure, unspecified: Secondary | ICD-10-CM | POA: Diagnosis not present

## 2017-04-24 DIAGNOSIS — F0280 Dementia in other diseases classified elsewhere without behavioral disturbance: Secondary | ICD-10-CM | POA: Diagnosis present

## 2017-04-24 DIAGNOSIS — R6521 Severe sepsis with septic shock: Secondary | ICD-10-CM | POA: Diagnosis not present

## 2017-04-24 DIAGNOSIS — I130 Hypertensive heart and chronic kidney disease with heart failure and stage 1 through stage 4 chronic kidney disease, or unspecified chronic kidney disease: Secondary | ICD-10-CM | POA: Diagnosis present

## 2017-04-24 DIAGNOSIS — I2510 Atherosclerotic heart disease of native coronary artery without angina pectoris: Secondary | ICD-10-CM | POA: Diagnosis present

## 2017-04-24 DIAGNOSIS — N184 Chronic kidney disease, stage 4 (severe): Secondary | ICD-10-CM | POA: Diagnosis present

## 2017-04-24 DIAGNOSIS — E785 Hyperlipidemia, unspecified: Secondary | ICD-10-CM | POA: Diagnosis present

## 2017-04-24 DIAGNOSIS — D472 Monoclonal gammopathy: Secondary | ICD-10-CM | POA: Diagnosis present

## 2017-04-24 DIAGNOSIS — R627 Adult failure to thrive: Secondary | ICD-10-CM

## 2017-04-24 DIAGNOSIS — J9621 Acute and chronic respiratory failure with hypoxia: Secondary | ICD-10-CM | POA: Diagnosis present

## 2017-04-24 DIAGNOSIS — J9601 Acute respiratory failure with hypoxia: Secondary | ICD-10-CM | POA: Diagnosis present

## 2017-04-24 DIAGNOSIS — N170 Acute kidney failure with tubular necrosis: Secondary | ICD-10-CM

## 2017-04-24 DIAGNOSIS — E872 Acidosis: Secondary | ICD-10-CM | POA: Diagnosis not present

## 2017-04-24 DIAGNOSIS — I959 Hypotension, unspecified: Secondary | ICD-10-CM | POA: Diagnosis not present

## 2017-04-24 DIAGNOSIS — E870 Hyperosmolality and hypernatremia: Secondary | ICD-10-CM | POA: Diagnosis not present

## 2017-04-24 DIAGNOSIS — E874 Mixed disorder of acid-base balance: Secondary | ICD-10-CM | POA: Diagnosis not present

## 2017-04-24 DIAGNOSIS — Z951 Presence of aortocoronary bypass graft: Secondary | ICD-10-CM | POA: Diagnosis not present

## 2017-04-24 DIAGNOSIS — E2740 Unspecified adrenocortical insufficiency: Secondary | ICD-10-CM | POA: Diagnosis present

## 2017-04-24 DIAGNOSIS — I21A1 Myocardial infarction type 2: Secondary | ICD-10-CM | POA: Diagnosis not present

## 2017-04-24 DIAGNOSIS — G309 Alzheimer's disease, unspecified: Secondary | ICD-10-CM | POA: Diagnosis present

## 2017-04-24 DIAGNOSIS — K7200 Acute and subacute hepatic failure without coma: Secondary | ICD-10-CM | POA: Diagnosis not present

## 2017-04-24 DIAGNOSIS — K219 Gastro-esophageal reflux disease without esophagitis: Secondary | ICD-10-CM | POA: Diagnosis present

## 2017-04-24 DIAGNOSIS — R0602 Shortness of breath: Secondary | ICD-10-CM | POA: Diagnosis not present

## 2017-04-24 DIAGNOSIS — E440 Moderate protein-calorie malnutrition: Secondary | ICD-10-CM | POA: Diagnosis present

## 2017-04-24 DIAGNOSIS — G931 Anoxic brain damage, not elsewhere classified: Secondary | ICD-10-CM | POA: Diagnosis not present

## 2017-04-24 DIAGNOSIS — J9622 Acute and chronic respiratory failure with hypercapnia: Secondary | ICD-10-CM | POA: Diagnosis present

## 2017-04-24 LAB — COMPREHENSIVE METABOLIC PANEL
ALT: 567 U/L — ABNORMAL HIGH (ref 17–63)
ANION GAP: 15 (ref 5–15)
AST: 883 U/L — ABNORMAL HIGH (ref 15–41)
Albumin: 3.4 g/dL — ABNORMAL LOW (ref 3.5–5.0)
Alkaline Phosphatase: 44 U/L (ref 38–126)
BUN: 37 mg/dL — ABNORMAL HIGH (ref 6–20)
CHLORIDE: 109 mmol/L (ref 101–111)
CO2: 18 mmol/L — ABNORMAL LOW (ref 22–32)
Calcium: 8.8 mg/dL — ABNORMAL LOW (ref 8.9–10.3)
Creatinine, Ser: 2.4 mg/dL — ABNORMAL HIGH (ref 0.61–1.24)
GFR, EST AFRICAN AMERICAN: 27 mL/min — AB (ref >60)
GFR, EST NON AFRICAN AMERICAN: 23 mL/min — AB (ref >60)
Glucose, Bld: 230 mg/dL — ABNORMAL HIGH (ref 65–99)
POTASSIUM: 4.3 mmol/L (ref 3.5–5.1)
Sodium: 142 mmol/L (ref 135–145)
Total Bilirubin: 0.5 mg/dL (ref 0.3–1.2)
Total Protein: 7.5 g/dL (ref 6.5–8.1)

## 2017-04-24 LAB — BLOOD GAS, ARTERIAL
ACID-BASE DEFICIT: 5.1 mmol/L — AB (ref 0.0–2.0)
Acid-base deficit: 6.5 mmol/L — ABNORMAL HIGH (ref 0.0–2.0)
BICARBONATE: 20.6 mmol/L (ref 20.0–28.0)
Bicarbonate: 18.8 mmol/L — ABNORMAL LOW (ref 20.0–28.0)
Drawn by: 277331
FIO2: 0.6
LHR: 20 {breaths}/min
MECHVT: 560 mL
O2 Content: 15 L/min
O2 SAT: 89.3 %
O2 Saturation: 96.9 %
PATIENT TEMPERATURE: 37
PCO2 ART: 43.7 mmHg (ref 32.0–48.0)
PEEP: 5 cmH2O
PO2 ART: 123 mmHg — AB (ref 83.0–108.0)
pCO2 arterial: 32.1 mmHg (ref 32.0–48.0)
pH, Arterial: 7.39 (ref 7.350–7.450)
pH, Arterial: 7.266 — ABNORMAL LOW (ref 7.350–7.450)
pO2, Arterial: 67.7 mmHg — ABNORMAL LOW (ref 83.0–108.0)

## 2017-04-24 LAB — URINALYSIS, ROUTINE W REFLEX MICROSCOPIC
BILIRUBIN URINE: NEGATIVE
Glucose, UA: 50 mg/dL — AB
HGB URINE DIPSTICK: NEGATIVE
KETONES UR: NEGATIVE mg/dL
Leukocytes, UA: NEGATIVE
Nitrite: NEGATIVE
PROTEIN: 100 mg/dL — AB
Specific Gravity, Urine: 1.008 (ref 1.005–1.030)
pH: 5 (ref 5.0–8.0)

## 2017-04-24 LAB — BASIC METABOLIC PANEL WITH GFR
Anion gap: 9 (ref 5–15)
BUN: 37 mg/dL — ABNORMAL HIGH (ref 6–20)
CO2: 22 mmol/L (ref 22–32)
Calcium: 8.9 mg/dL (ref 8.9–10.3)
Chloride: 112 mmol/L — ABNORMAL HIGH (ref 101–111)
Creatinine, Ser: 2.27 mg/dL — ABNORMAL HIGH (ref 0.61–1.24)
GFR calc Af Amer: 29 mL/min — ABNORMAL LOW
GFR calc non Af Amer: 25 mL/min — ABNORMAL LOW
Glucose, Bld: 118 mg/dL — ABNORMAL HIGH (ref 65–99)
Potassium: 4.6 mmol/L (ref 3.5–5.1)
Sodium: 143 mmol/L (ref 135–145)

## 2017-04-24 LAB — CBC
HCT: 30.1 % — ABNORMAL LOW (ref 39.0–52.0)
Hemoglobin: 9.8 g/dL — ABNORMAL LOW (ref 13.0–17.0)
MCH: 32.2 pg (ref 26.0–34.0)
MCHC: 32.6 g/dL (ref 30.0–36.0)
MCV: 99 fL (ref 78.0–100.0)
PLATELETS: 144 10*3/uL — AB (ref 150–400)
RBC: 3.04 MIL/uL — ABNORMAL LOW (ref 4.22–5.81)
RDW: 16.9 % — AB (ref 11.5–15.5)
WBC: 9.3 10*3/uL (ref 4.0–10.5)

## 2017-04-24 LAB — LACTIC ACID, PLASMA
LACTIC ACID, VENOUS: 1.3 mmol/L (ref 0.5–1.9)
Lactic Acid, Venous: 7.3 mmol/L (ref 0.5–1.9)
Lactic Acid, Venous: 2 mmol/L (ref 0.5–1.9)

## 2017-04-24 LAB — MAGNESIUM: MAGNESIUM: 2 mg/dL (ref 1.7–2.4)

## 2017-04-24 LAB — MRSA PCR SCREENING: MRSA by PCR: NEGATIVE

## 2017-04-24 LAB — TROPONIN I: TROPONIN I: 0.76 ng/mL — AB (ref <0.03)

## 2017-04-24 MED ORDER — SODIUM CHLORIDE 0.9 % IV SOLN
1500.0000 mg | Freq: Once | INTRAVENOUS | Status: AC
  Administered 2017-04-24: 1500 mg via INTRAVENOUS
  Filled 2017-04-24 (×2): qty 1500

## 2017-04-24 MED ORDER — FENTANYL CITRATE (PF) 100 MCG/2ML IJ SOLN: 50.0000 ug | INTRAMUSCULAR | Status: DC | PRN

## 2017-04-24 MED ORDER — MIDAZOLAM HCL 50 MG/10ML IJ SOLN
INTRAMUSCULAR | Status: AC
  Filled 2017-04-24: qty 1

## 2017-04-24 MED ORDER — VANCOMYCIN HCL IN DEXTROSE 750-5 MG/150ML-% IV SOLN
750.0000 mg | INTRAVENOUS | Status: DC
  Administered 2017-04-25: 750 mg via INTRAVENOUS
  Filled 2017-04-24 (×2): qty 150

## 2017-04-24 MED ORDER — PANTOPRAZOLE SODIUM 40 MG IV SOLR: 40.0000 mg | INTRAVENOUS | Status: DC

## 2017-04-24 MED ORDER — IPRATROPIUM-ALBUTEROL 0.5-2.5 (3) MG/3ML IN SOLN
3.0000 mL | Freq: Four times a day (QID) | RESPIRATORY_TRACT | Status: DC
  Administered 2017-04-24 – 2017-04-25 (×2): 3 mL via RESPIRATORY_TRACT
  Filled 2017-04-24 (×2): qty 3

## 2017-04-24 MED ORDER — SODIUM CHLORIDE 0.9 % IV SOLN
50.0000 ug/h | INTRAVENOUS | Status: DC
  Administered 2017-04-24: 50 ug/h via INTRAVENOUS
  Filled 2017-04-24: qty 50

## 2017-04-24 MED ORDER — PIPERACILLIN-TAZOBACTAM 3.375 G IVPB
3.3750 g | Freq: Three times a day (TID) | INTRAVENOUS | Status: DC
  Administered 2017-04-25 – 2017-04-26 (×4): 3.375 g via INTRAVENOUS
  Filled 2017-04-24 (×4): qty 50

## 2017-04-24 MED ORDER — PANTOPRAZOLE SODIUM 40 MG IV SOLR
40.0000 mg | Freq: Every day | INTRAVENOUS | Status: DC
  Administered 2017-04-24 – 2017-04-26 (×3): 40 mg via INTRAVENOUS
  Filled 2017-04-24 (×3): qty 40

## 2017-04-24 MED ORDER — MIDAZOLAM HCL 2 MG/2ML IJ SOLN
1.0000 mg | INTRAMUSCULAR | Status: DC | PRN
  Administered 2017-04-24 (×2): 1 mg via INTRAVENOUS
  Filled 2017-04-24 (×2): qty 2

## 2017-04-24 MED ORDER — MIDAZOLAM HCL 50 MG/10ML IJ SOLN
0.5000 mg/h | INTRAMUSCULAR | Status: DC
  Administered 2017-04-24 – 2017-04-25 (×2): 0.5 mg/h via INTRAVENOUS
  Filled 2017-04-24 (×3): qty 10

## 2017-04-24 MED ORDER — FENTANYL CITRATE (PF) 100 MCG/2ML IJ SOLN
50.0000 ug | INTRAMUSCULAR | Status: DC | PRN
  Administered 2017-04-24 (×2): 50 ug via INTRAVENOUS
  Filled 2017-04-24 (×2): qty 2

## 2017-04-24 MED ORDER — CHLORHEXIDINE GLUCONATE 0.12% ORAL RINSE (MEDLINE KIT)
15.0000 mL | Freq: Two times a day (BID) | OROMUCOSAL | Status: DC
  Administered 2017-04-24 – 2017-04-29 (×11): 15 mL via OROMUCOSAL

## 2017-04-24 MED ORDER — MIDAZOLAM HCL 2 MG/2ML IJ SOLN: 1.0000 mg | INTRAMUSCULAR | Status: DC | PRN

## 2017-04-24 MED ORDER — PIPERACILLIN-TAZOBACTAM 3.375 G IVPB 30 MIN
3.3750 g | Freq: Once | INTRAVENOUS | Status: AC
  Administered 2017-04-24: 3.375 g via INTRAVENOUS
  Filled 2017-04-24 (×2): qty 50

## 2017-04-24 MED ORDER — AMOXICILLIN-POT CLAVULANATE 500-125 MG PO TABS
1.0000 | ORAL_TABLET | Freq: Two times a day (BID) | ORAL | Status: DC
  Administered 2017-04-24: 500 mg via ORAL
  Filled 2017-04-24 (×3): qty 1

## 2017-04-24 MED ORDER — FENTANYL CITRATE (PF) 2500 MCG/50ML IJ SOLN
INTRAMUSCULAR | Status: AC
  Filled 2017-04-24: qty 50

## 2017-04-24 MED ORDER — ORAL CARE MOUTH RINSE
15.0000 mL | Freq: Four times a day (QID) | OROMUCOSAL | Status: DC
  Administered 2017-04-25 – 2017-04-30 (×23): 15 mL via OROMUCOSAL

## 2017-04-24 MED ORDER — FUROSEMIDE 10 MG/ML IJ SOLN
80.0000 mg | Freq: Once | INTRAMUSCULAR | Status: AC
  Administered 2017-04-24: 80 mg via INTRAVENOUS
  Filled 2017-04-24: qty 8

## 2017-04-24 MED ORDER — SODIUM CHLORIDE 0.9 % IV BOLUS (SEPSIS)
1000.0000 mL | Freq: Once | INTRAVENOUS | Status: AC
  Administered 2017-04-24: 1000 mL via INTRAVENOUS

## 2017-04-24 NOTE — Progress Notes (Signed)
In patient's room with doctor rounding, patient was given IV lasix and weaned down to HFNC at 5 liters, O2 sats staying around 97.  Call was placed to central tele asking them to pay special attention to this patient's continuous pulse oximetry readings due to low O2 sats previously recorded earlier in the day.  I personally stayed on the phone with Dwayne to make sure that the reading was being transmitted to central tele from the patient's monitor box.  There was a problem with central tele reading the monitor and a called was placed to [place] by the Network engineer here at [employer], [place].  [employer] reset the program and the readings were being transmitted.  Dwayne called back and confirmed that the patient's pulse oximetry readings were being transmitted.

## 2017-04-24 NOTE — Progress Notes (Signed)
Called to room by RN where family member is requesting breathing tx for patient.  Received in report patient should be on 2 LPM, walk in he is on 6 LPM, sats 80 -85%.  Gave patient nebulizer tx and had to place patient on HFNC at 15 LPM, sats 93-94% BBS are mostly diminished.  RN notified of changes made.  Family member reports he is more anxious/agitated this morning more than usual.  Will continue to closely monitor patient.

## 2017-04-24 NOTE — Progress Notes (Signed)
Physical Therapy Treatment Patient Details Name: [name] [name] MRN: [id number] DOB: 09-Jan-1931 Today's Date: 04/24/2017    History of Present Illness [name] [name] is a 83 y.o. male with medical history significant of multiple medical issues including Alzheimer disease, coronary artery disease status post CABG in 1997, stage III CAD, COPD presenting with fall, acute on chronic kidney injury, dehydration, elevated troponin, AKI, encephalopathy.  Level V caveat in the setting of encephalopathy baseline dementia. History primarily from patient's wife. Per report, patient with episode of fall at home. No reported weakness or dizziness. Patient fell from standing height. No reported head trauma. Denies any prior episodes of fall in the past so patient has had overall due to difficulty with ambulation and gait. Patient also with progressively decreased appetite as well as abdominal distention. No reported episodes of vomiting or diarrhea. Baseline history of dementia. No reported recent medication changes. Wears oxygen at night in the setting of COPD. No reported increasing O2 demands. No reported chest pain though patient has reported some questionable abdominal discomfort.    PT Comments    Pt supine in bed upon therapist entrance, wife and nurse in room with reports of increased congestion today.  Pt verbalized agreement to complete therapy, did require moderate cueing to arouse and open eyes.  O2% sat at 94% prior transfer to chair.  Mod A with bed mobility, transfer and min--mod A with gait with RW x 15 ft.  Pt left in chair with call bell within reach, wife in room and chair alarm set.  O2 sat at 92% at EOS.  No reports of pain.     Follow Up Recommendations  SNF;Supervision/Assistance - 24 hour     Equipment Recommendations  Wheelchair (measurements PT);Wheelchair cushion (measurements PT)    Recommendations for Other Services       Precautions / Restrictions Precautions Precautions:  Fall Restrictions Weight Bearing Restrictions: No    Mobility  Bed Mobility Overal bed mobility: Needs Assistance Bed Mobility: Supine to Sit;Sit to Supine     Supine to sit: Mod assist     General bed mobility comments: cueing for mechanics  Transfers Overall transfer level: Needs assistance Equipment used: Rolling walker (2 wheeled) Transfers: Sit to/from Stand Sit to Stand: Mod assist (bed elevated height)         General transfer comment: max cueing for hand placement to assist  Ambulation/Gait Ambulation/Gait assistance: Mod assist Ambulation Distance (Feet): 15 Feet Assistive device: Rolling walker (2 wheeled) Gait Pattern/deviations: Decreased step length - right;Decreased step length - left;Decreased stride length     General Gait Details: Patient demonstrates slow labored movement with frequent standing rest breaks due to SOB, c/o fatigue   Stairs            Wheelchair Mobility    Modified Rankin (Stroke Patients Only)       Balance                                            Cognition Arousal/Alertness: Awake/alert Behavior During Therapy: WFL for tasks assessed/performed (Pt initially difficulty opening eyes/arrousal) Overall Cognitive Status: Within Functional Limits for tasks assessed                                        Exercises  General Comments        Pertinent Vitals/Pain Pain Assessment: No/denies pain    Home Living                      Prior Function            PT Goals (current goals can now be found in the care plan section)      Frequency    Min 3X/week      PT Plan      Co-evaluation              AM-PAC PT "6 Clicks" Daily Activity  Outcome Measure  Difficulty turning over in bed (including adjusting bedclothes, sheets and blankets)?: A Little Difficulty moving from lying on back to sitting on the side of the bed? : A Lot Difficulty sitting  down on and standing up from a chair with arms (e.g., wheelchair, bedside commode, etc,.)?: A Lot Help needed moving to and from a bed to chair (including a wheelchair)?: A Lot Help needed walking in hospital room?: A Lot Help needed climbing 3-5 steps with a railing? : A Lot 6 Click Score: 13    End of Session Equipment Utilized During Treatment: Gait belt Activity Tolerance: Patient limited by fatigue;Patient limited by lethargy Patient left: in chair;with call bell/phone within reach;with chair alarm set;with nursing/sitter in room;with family/visitor present Nurse Communication: Mobility status PT Visit Diagnosis: Unsteadiness on feet (R26.81);Other abnormalities of gait and mobility (R26.89);Muscle weakness (generalized) (M62.81)     Time: 3810-1751 PT Time Calculation (min) (ACUTE ONLY): 32 min  Charges:  $Therapeutic Activity: 8-22 mins                    G Codes:       Ihor Austin, LPTA; CBIS [phone]   Aldona Lento 04/24/2017, 1:09 PM

## 2017-04-24 NOTE — Progress Notes (Signed)
Patient was placed on HF [place] this am and after receiving IV lasix was transitioned to 4-5 L via [place]. Around 4;30 P he was found unresponsive and pulseless. Code blu was activated.  CPR was done forabout 5-7 minutes and received a dose of 1mg  IV epinephrine. Patient was intubated at bedside. Suspect patient had a pulseless electric activity. Patient was transferred to ICU on ventilator. CXR done showed no acute congestion or infiltrate ( appears clear from CXR done earlier today). Started on fentanyl and versed with titration for sedation. Labs done showed elevated lactic acid of 7.3 and significant transaminitis.   ABG: Ph:7,26, po2 123, pco2 43.7, co2 18. Vent setting: TV 530, Fio2 60%, PEEP 5  Blood cx sent and placed on empiric IV vancomycin and zosyn. Patient possibly had acuter respiratory failure secondary to aspiration PNA, acute CHF, vs  acute PE .  Discussed at length with wife and daughter at bedside. They wanted to continue aggressive care. They understand patient has significant comorbidities including his age and recent FTT.  Labs   I Discussed withn PCCM on call Dr Cecille Po regarding transfer to [facility] . He has accepted the patient but there are currently no beds and will be transferred there once ICU bed available.  Recommended IV Fluid bolus and repeat lactate . Recommended to call be after result.   Signed out to my partner

## 2017-04-24 NOTE — Progress Notes (Signed)
Patient's wife expressed concern about patient seeming to have difficulty breathing, O2 sats were 84 to 85, respiratory placed patient on high flow nasal canula at 15 liters with O2 sat improving to 98 to 100 percent.  Patient resting and more comfortable.  Patient was placed on continuous pulse oximetry.

## 2017-04-24 NOTE — Progress Notes (Signed)
Dr. Clementeen Graham rounding on patient again this afternoon, standing at nurses desk, asking about pulse oximetry reading.  I looked at the reading on the monitor and patient's HR was reading 45, and no pulse oximetry reading was displayed.  I called central tele immediately to question the problem.  Dwayne stated that the pulse oximetry readings had been going on and off.   I then proceeded immediately to the patient's room, at which time he was found to be unresponsive, nasal canula had been removed by the patient and was not in place.  I immediately told secretary, Inez Catalina, to call a code. The patient had no pulse, I began CPR.   Code team came, patient received CPR for about 8 to 10 minutes, intubated and placed on vent, transferred to ICU.

## 2017-04-24 NOTE — Progress Notes (Signed)
Pt transported to and from CT scan on vent with no complication, pt within normal limits.

## 2017-04-24 NOTE — Clinical Social Work Note (Signed)
Clinical Social Work Assessment  Patient Details  Name: [name] [name] MRN: [id number] Date of Birth: 13-Mar-1931  Date of referral:  04/24/17               Reason for consult:  Discharge Planning                Permission sought to share information with:    Permission granted to share information::     Name::        Agency::     Relationship::     Contact Information:     Housing/Transportation Living arrangements for the past 2 months:  Apartment Source of Information:  Spouse Patient Interpreter Needed:  None Criminal Activity/Legal Involvement Pertinent to Current Situation/Hospitalization:    Significant Relationships:  Adult Children, Spouse Lives with:    Do you feel safe going back to the place where you live?  Yes Need for family participation in patient care:  Yes (Comment)  Care giving concerns:  Patient has  [place] services through the [place] on Wednesday, Thursday, Friday, Saturday and Sunday for 15 hours a week. Mrs. Suhr states that she assists patient with bathing and dressing. Patient is able to feed himself. At baseline, he ambulates unassisted.    Social Worker assessment / plan:  Mrs. Mirsky stated that she would speak with patient and his children about SNF placement and let LCSW know.  LCSW to follow up with patient and spouse in the morning to identify decision.  Employment status:  Retired Nurse, adult PT Recommendations:  [place], [place] / Referral to community resources:  [place]  Patient/Family's Response to care:  Patient and family will consider SNF.   Patient/Family's Understanding of and Emotional Response to Diagnosis, Current Treatment, and Prognosis:  Patient's spouse understands patient's diagnosis, treatment and prognosis.   Emotional Assessment Appearance:  Appears stated age Attitude/Demeanor/Rapport:    Affect (typically observed):  Unable to Assess (Patient  was asleep in his chair during assessment.) Orientation:  Oriented to Self Alcohol / Substance use:  Not Applicable Psych involvement (Current and /or in the community):  No (Comment)  Discharge Needs  Concerns to be addressed:  No discharge needs identified Readmission within the last 30 days:  No Current discharge risk:  Chronically ill Barriers to Discharge:  No Barriers Identified   Ihor Gully, LCSW 04/24/2017, 1:27 PM

## 2017-04-24 NOTE — Progress Notes (Signed)
Pharmacy Antibiotic Note  [name] [name] is a 83 y.o. male admitted on 04/25/2017 with sepsis.  Pharmacy has been consulted for Vancomycin and zosyn dosing.  Plan: Vancomycin 1500mg  loading dose, then 750mg   IV every 24 hours.  Goal trough 15-20 mcg/mL. Zosyn 3.375g IV q8h (4 hour infusion).  F/U cxs and clinical progress Monitor V/S, labs, and levels as indicated  Height: 5\' 7"  (170.2 cm) Weight: 172 lb 3.2 oz (78.1 kg) IBW/kg (Calculated) : 66.1  Temp (24hrs), Avg:98.5 F (36.9 C), Min:98.4 F (36.9 C), Max:98.6 F (37 C)   Recent Labs Lab 05/16/2017 1220 05/19/2017 1253 05/12/2017 1510 04/23/17 0623 04/24/17 0536 04/24/17 1735  WBC 8.2  --   --  7.7  --  9.3  CREATININE 3.12*  --   --  2.29* 2.27* 2.40*  LATICACIDVEN  --  1.2 1.3  --   --  7.3*    Estimated Creatinine Clearance: 21.8 mL/min (A) (by C-G formula based on SCr of 2.4 mg/dL (H)).    No Known Allergies  Antimicrobials this admission: Vancomycin 10/3 >>  Zosyn 10/3>>   Dose adjustments this admission: N/A  Microbiology results: 10/3 BCx: pending 10/3 UCx: pending 10/3 MRSA PCR: pending  Thank you for allowing pharmacy to be a part of this patient's care. Isac Sarna, BS [place], [place] Clinical Pharmacist Pager [phone] 04/24/2017 8:23 PM

## 2017-04-24 NOTE — ED Provider Notes (Signed)
Patient inpatient. CODE BLUE call for cardiac arrest. Responded as part of the emergency department team for [place]. Patient the in pulseless electrical activity. Patient required intubation. Hospitalist team present running the code.  Respiratory therapy assisted  Patient emergently intubated 7-1/2 tube using glide a scope. No difficulty intubating him. Patient had upper denture still in place. They were removed after intubation. Patient responded well to the intubation after several ventilations started to breathe on his own.  INTUBATION Performed by: Evolett Somarriba  Required items: required blood products, implants, devices, and special equipment available Patient identity confirmed: provided demographic data and hospital-assigned identification number Time out: Immediately prior to procedure a "time out" was called to verify the correct patient, procedure, equipment, support staff and site/side marked as required.  Indications: Cardiac arrest emergent intubation   Intubation method: Glidescope Laryngoscopy   Preoxygenation: 100% oxygen BVM  Sedatives: No sedation  Paralytic: None   Tube Size: 7.5 cuffed  Post-procedure assessment: chest rise and ETCO2 monitor Breath sounds: equal and absent over the epigastrium Tube secured with: ETT holder Chest x-ray interpreted by radiologist and me.  Chest x-ray findings: Pending   Patient tolerated the procedure well with no immediate complications.     Fredia Sorrow, MD 04/24/17 1753

## 2017-04-24 NOTE — Progress Notes (Deleted)
Patient's wife expressed concern about patient seeming to have difficulty breathing, O2 sats were 84 to 85, respiratory placed patient on high flow nasal canula at 15 liters with O2 sat improving to 98 to 100 percent.  Patient resting and more comfortable.  Patient was placed on continuous pulse oximetry.

## 2017-04-24 NOTE — Progress Notes (Signed)
PROGRESS NOTE                                                                                                                                                                                                             Patient Demographics:    [name] [name], is a 83 y.o. male, DOB - 14-Jul-1931, SJG:[id number]  Admit date - 05/15/2017   Admitting Physician Deneise Lever, MD  Outpatient Primary MD for the patient is Antony Contras, MD  LOS - 0  Outpatient Specialists:None  Chief Complaint  Patient presents with  . Fall  . Weakness       Brief Narrative   83 year old male with Alzheimer's dementia, coronary artery disease status post CABG in 34, COPD who presented with fall at home. Reportedly he had poor by mouth intake and abdominal distention. He was found to have acute kidney injury and elevated troponin along with failure to thrive.      Subjective:    Patient found to be short of breath and was satting at 80-85% on 6 L via nasal cannula, was placed on high flow nasal cannula and sats improved. Patient is coughing and is poorly communicative. Wife at bedside.    Assessment  & Plan :   Principal problem Acute on chronic respiratory failure with hypoxia [facility]) Patient requiring high flow nasal cannula this morning possibly due to volume overload. Has coarse bibasilar crackles. Chest x-ray shows worsening bilateral lower density (R >L). ABG shows hypoxemia. Ordered a dose of IV Lasix. Strict I/O. Continue high flow nasal cannula and wean as tolerated. Will make him nothing by mouth and obtain swallow evaluation to rule out aspiration..   Active Problems: Acute on chronic kidney disease stage IV Secondary to dehydration and improved to baseline with IV hydration. Fluid now held due to volume overload.  Failure to thrive with poor by mouth intake. On thickened liquids. Gets swallow eval to rule out  aspiration. Patient is on Augmentin for reasons unclear.    Chronic respiratory failure with hypoxia (HCC)   COPD GOLD II She was to schedule DuoNeb's. Continue O2 via nasal cannula.    FTT (failure to thrive) in adult PT recommends SNF.    Demand ischemia ([facility]) Possibly secondary to acute kidney injury/dehydration. EKG unremarkable. Troponins flat. No further ischemic workup per cardiology.   Coronary artery  disease status post CABG Continue aspirin.  Alzheimer's dementia     Code Status : Full code  Family Communication  : Wife at bedside  Disposition Plan  : SNF pending clinical improvement  Barriers For Discharge : Active symptoms  Consults  :  None  Procedures  : CT abdomen pelvis  DVT Prophylaxis  : Subcutaneous heparin  Lab Results  Component Value Date   PLT 187 04/23/2017    Antibiotics  :  Augmentin  Anti-infectives    Start     Dose/Rate Route Frequency Ordered Stop   04/24/17 1000  amoxicillin-clavulanate (AUGMENTIN) 500-125 MG per tablet 500 mg     1 tablet Oral 2 times daily 04/24/17 0846 04/25/17 2159   04/23/17 1800  amoxicillin-clavulanate (AUGMENTIN) 875-125 MG per tablet 1 tablet  Status:  Discontinued    Comments:  One po bid x 7 days     1 tablet Oral 2 times daily 04/23/17 1726 04/24/17 0846        Objective:   Vitals:   04/24/17 0629 04/24/17 0738 04/24/17 0758 04/24/17 1304  BP: (!) 162/81     Pulse: 84     Resp: 18     Temp: 98.6 F (37 C)     TempSrc: Axillary     SpO2: 94% (!) 85% 94% 94%  Weight:      Height:        Wt Readings from Last 3 Encounters:  05/20/2017 78.1 kg (172 lb 3.2 oz)  04/17/17 72.6 kg (160 lb)  02/27/17 72.6 kg (160 lb)     Intake/Output Summary (Last 24 hours) at 04/24/17 1406 Last data filed at 04/24/17 1100  Gross per 24 hour  Intake          1988.33 ml  Output             1750 ml  Net           238.33 ml     Physical Exam  Gen: Fatigue, arousable but poorly communicative HEENT: No  pallor, moist mucosa, supple neck, no JVD Chest: Coarse bibasilar breath sounds CVS: Normal S1 and S2, no murmurs rub or gallop GI: Soft, nondistended, nontender, bowel sounds present Musculoskeletal: Warm, trace pitting edema  CNS: Awake to commands but poorly communicative    Data Review:    CBC  Recent Labs Lab 04/17/17 1855 05/03/2017 1220 04/23/17 0623  WBC 5.9 8.2 7.7  HGB 11.1* 10.4* 10.4*  HCT 33.2* 31.5* 31.8*  PLT 219 196 187  MCV 96.2 97.5 97.5  MCH 32.2 32.2 31.9  MCHC 33.4 33.0 32.7  RDW 15.5 16.1* 16.0*  LYMPHSABS 2.3 1.3  --   MONOABS 0.4 0.9  --   EOSABS 0.2 0.0  --   BASOSABS 0.0 0.0  --     Chemistries   Recent Labs Lab 04/17/17 1855 04/25/2017 1220 04/23/17 0623 04/24/17 0536  NA 138 138 141 143  K 4.2 4.8 4.1 4.6  CL 106 102 109 112*  CO2 25 25 21* 22  GLUCOSE 109* 110* 86 118*  BUN 28* 32* 26* 37*  CREATININE 2.58* 3.12* 2.29* 2.27*  CALCIUM 9.8 9.9 9.1 8.9  MG 2.0 2.1  --   --   AST 27 90* 53*  --   ALT 15* 55 43  --   ALKPHOS 30* 41 35*  --   BILITOT 0.5 0.5 0.7  --    ------------------------------------------------------------------------------------------------------------------ No results for input(s): CHOL, HDL, LDLCALC, TRIG, CHOLHDL, LDLDIRECT  in the last 72 hours.  Lab Results  Component Value Date   HGBA1C 6.5 (H) 12/22/2013   ------------------------------------------------------------------------------------------------------------------ No results for input(s): TSH, T4TOTAL, T3FREE, THYROIDAB in the last 72 hours.  Invalid input(s): FREET3 ------------------------------------------------------------------------------------------------------------------ No results for input(s): VITAMINB12, FOLATE, FERRITIN, TIBC, IRON, RETICCTPCT in the last 72 hours.  Coagulation profile No results for input(s): INR, PROTIME in the last 168 hours.  No results for input(s): DDIMER in the last 72 hours.  Cardiac Enzymes  Recent  Labs Lab 05/08/2017 1720 04/23/17 0105 04/23/17 0623  TROPONINI 0.25* 0.19* 0.21*   ------------------------------------------------------------------------------------------------------------------    Component Value Date/Time   BNP 258.0 (H) 04/17/2017 1855   BNP 149.1 (H) 03/12/2016 1257    Inpatient Medications  Scheduled Meds: . amoxicillin-clavulanate  1 tablet Oral BID  . aspirin EC  81 mg Oral Daily  . calcitRIOL  0.5 mcg Oral Daily  . cholecalciferol  2,000 Units Oral QODAY  . finasteride  5 mg Oral q morning - 10a  . fluticasone  2 spray Each Nare BID  . furosemide  80 mg Intravenous Once  . heparin  5,000 Units Subcutaneous Q8H  . ipratropium  1 spray Each Nare QID  . latanoprost  1 drop Both Eyes QHS  . memantine  10 mg Oral BID  . metoCLOPramide  10 mg Oral BID  . metoprolol succinate  25 mg Oral Daily  . pantoprazole  40 mg Oral Daily  . rosuvastatin  40 mg Oral QPM  . tamsulosin  0.8 mg Oral QHS   Continuous Infusions: PRN Meds:.acetaminophen **OR** acetaminophen, ipratropium-albuterol, ondansetron (ZOFRAN) IV, polyethylene glycol  Micro Results No results found for this or any previous visit (from the past 240 hour(s)).  Radiology Reports Ct Abdomen Pelvis Wo Contrast  Result Date: 05/08/2017 CLINICAL DATA:  Patient status post fall.  Generalized weakness. EXAM: CT ABDOMEN AND PELVIS WITHOUT CONTRAST TECHNIQUE: Multidetector CT imaging of the abdomen and pelvis was performed following the standard protocol without IV contrast. COMPARISON:  CT abdomen pelvis 01/10/2017. FINDINGS: Lower chest: Cardiomegaly. Re- demonstrated emphysematous change with interstitial fibrosis. No pleural effusion. Hepatobiliary: Liver is normal in size and contour. Gallbladder is decompressed. Small stone in the gallbladder lumen. No intrahepatic or extrahepatic biliary ductal dilatation. Pancreas: Unremarkable Spleen: Unremarkable Adrenals/Urinary Tract: Normal adrenal glands. Re-  demonstrated bilateral renal cysts. Kidneys are symmetric in size. No hydronephrosis. Large diverticulum off the posterior left urinary bladder. Stomach/Bowel: Moderate size hiatal hernia. Normal morphology of the stomach. Oral contrast material to the rectum. Normal appendix. Descending and sigmoid colonic diverticulosis. No evidence for acute diverticulitis. No evidence for bowel obstruction. No free fluid or free intraperitoneal air. Vascular/Lymphatic: Normal caliber abdominal aorta. Stent within the left renal artery. Peripheral calcified atherosclerotic plaque involving the abdominal aorta. Bilateral common iliac artery stents. Reproductive: Prostate is enlarged. Other: Left-greater-than-right bilateral fat containing inguinal hernias. Musculoskeletal: Lumbar spine degenerative changes. No aggressive or acute appearing osseous lesions. IMPRESSION: No acute process within the abdomen or pelvis. Re- demonstrated pulmonary fibrosis and emphysematous change. Bilateral fat containing inguinal hernias. Electronically Signed   By: Lovey Newcomer M.D.   On: 05/05/2017 17:19   Dg Chest 2 View  Result Date: 05/14/2017 CLINICAL DATA:  Generalized weakness EXAM: CHEST  2 VIEW COMPARISON:  04/17/2017 FINDINGS: Cardiac shadow is stable. Postoperative changes are again seen. Patchy interstitial changes are again identified throughout both lungs and relatively stable accentuated by poor inspiratory effort. No acute bony abnormality is seen. No sizable effusion is noted. IMPRESSION:  Chronic interstitial changes without acute abnormality. Electronically Signed   By: Inez Catalina M.D.   On: 04/29/2017 14:25   Dg Chest 2 View  Result Date: 04/17/2017 CLINICAL DATA:  Confusion EXAM: CHEST  2 VIEW COMPARISON:  01/10/2017, 06/29/2016, CT 06/08/2016 FINDINGS: Post sternotomy changes. Emphysematous changes. Coarse opacity within the bilateral lungs consistent with chronic interstitial change or fibrosis. Similar appearance  compared to prior radiograph. No acute consolidation or pleural effusion. Stable borderline cardiomegaly. No pneumothorax. IMPRESSION: Emphysematous changes and fibrosis. No definite acute infiltrate is seen Electronically Signed   By: Donavan Foil M.D.   On: 04/17/2017 19:31   Ct Head Wo Contrast  Result Date: 05/05/2017 CLINICAL DATA:  Fall EXAM: CT HEAD WITHOUT CONTRAST TECHNIQUE: Contiguous axial images were obtained from the base of the skull through the vertex without intravenous contrast. COMPARISON:  Head CT 06/29/2016 FINDINGS: Brain: No mass lesion, intraparenchymal hemorrhage or extra-axial collection. No evidence of acute cortical infarct. There is periventricular hypoattenuation compatible with chronic microvascular disease. Vascular: No hyperdense vessel or unexpected calcification. Skull: Normal visualized skull base, calvarium and extracranial soft tissues. Sinuses/Orbits: No sinus fluid levels or advanced mucosal thickening. No mastoid effusion. Normal orbits. IMPRESSION: Chronic small vessel disease without acute abnormality. Electronically Signed   By: Ulyses Jarred M.D.   On: 04/24/2017 17:11   Dg Chest Port 1 View  Result Date: 04/24/2017 CLINICAL DATA:  Worsening shortness of breath since yesterday. EXAM: PORTABLE CHEST 1 VIEW COMPARISON:  05/21/2017 and multiple previous FINDINGS: Previous median sternotomy and CABG. Background pattern of chronic emphysema and pulmonary scarring right more than left. Worsening pulmonary density particularly on the right, worrisome for developing edema or pneumonia. IMPRESSION: Previous CABG. Chronic lung disease. Worsening density right more than left which could be edema or pneumonia. Electronically Signed   By: Nelson Chimes M.D.   On: 04/24/2017 09:13    Time Spent in minutes  35   Louellen Molder M.D on 04/24/2017 at 2:06 PM  Between 7am to 7pm - Pager - [phone]  After 7pm go to [email or web address] - password [facility]  [facility] -   Office  [phone]

## 2017-04-25 ENCOUNTER — Ambulatory Visit: Payer: Medicare HMO | Admitting: Cardiology

## 2017-04-25 ENCOUNTER — Inpatient Hospital Stay (HOSPITAL_COMMUNITY): Payer: Medicare HMO

## 2017-04-25 ENCOUNTER — Ambulatory Visit: Payer: Self-pay

## 2017-04-25 ENCOUNTER — Other Ambulatory Visit: Payer: Self-pay | Admitting: *Deleted

## 2017-04-25 DIAGNOSIS — K7200 Acute and subacute hepatic failure without coma: Secondary | ICD-10-CM

## 2017-04-25 DIAGNOSIS — A419 Sepsis, unspecified organism: Secondary | ICD-10-CM

## 2017-04-25 DIAGNOSIS — J9621 Acute and chronic respiratory failure with hypoxia: Secondary | ICD-10-CM

## 2017-04-25 DIAGNOSIS — R0600 Dyspnea, unspecified: Secondary | ICD-10-CM

## 2017-04-25 DIAGNOSIS — R6521 Severe sepsis with septic shock: Secondary | ICD-10-CM

## 2017-04-25 DIAGNOSIS — N179 Acute kidney failure, unspecified: Secondary | ICD-10-CM

## 2017-04-25 DIAGNOSIS — I959 Hypotension, unspecified: Secondary | ICD-10-CM

## 2017-04-25 LAB — CBC
HCT: 27.2 % — ABNORMAL LOW (ref 39.0–52.0)
HEMOGLOBIN: 8.8 g/dL — AB (ref 13.0–17.0)
MCH: 32.5 pg (ref 26.0–34.0)
MCHC: 32.4 g/dL (ref 30.0–36.0)
MCV: 100.4 fL — ABNORMAL HIGH (ref 78.0–100.0)
Platelets: 147 10*3/uL — ABNORMAL LOW (ref 150–400)
RBC: 2.71 MIL/uL — AB (ref 4.22–5.81)
RDW: 17.3 % — ABNORMAL HIGH (ref 11.5–15.5)
WBC: 6.8 10*3/uL (ref 4.0–10.5)

## 2017-04-25 LAB — BLOOD GAS, ARTERIAL
ACID-BASE DEFICIT: 3.2 mmol/L — AB (ref 0.0–2.0)
Bicarbonate: 21.2 mmol/L (ref 20.0–28.0)
DRAWN BY: 21310
FIO2: 50
MECHVT: 530 mL
O2 Saturation: 95.8 %
PATIENT TEMPERATURE: 37
PCO2 ART: 44.3 mmHg (ref 32.0–48.0)
PEEP/CPAP: 5 cmH2O
PH ART: 7.317 — AB (ref 7.350–7.450)
PO2 ART: 104 mmHg (ref 83.0–108.0)
RATE: 20 resp/min

## 2017-04-25 LAB — BRAIN NATRIURETIC PEPTIDE: B Natriuretic Peptide: 985 pg/mL — ABNORMAL HIGH (ref 0.0–100.0)

## 2017-04-25 LAB — COMPREHENSIVE METABOLIC PANEL
ALBUMIN: 3 g/dL — AB (ref 3.5–5.0)
ALT: 540 U/L — AB (ref 17–63)
ANION GAP: 8 (ref 5–15)
AST: 644 U/L — ABNORMAL HIGH (ref 15–41)
Alkaline Phosphatase: 37 U/L — ABNORMAL LOW (ref 38–126)
BUN: 39 mg/dL — ABNORMAL HIGH (ref 6–20)
CALCIUM: 8.6 mg/dL — AB (ref 8.9–10.3)
CHLORIDE: 110 mmol/L (ref 101–111)
CO2: 26 mmol/L (ref 22–32)
CREATININE: 2.54 mg/dL — AB (ref 0.61–1.24)
GFR calc Af Amer: 25 mL/min — ABNORMAL LOW (ref >60)
GFR calc non Af Amer: 22 mL/min — ABNORMAL LOW (ref >60)
GLUCOSE: 113 mg/dL — AB (ref 65–99)
Potassium: 4.6 mmol/L (ref 3.5–5.1)
SODIUM: 144 mmol/L (ref 135–145)
Total Bilirubin: 0.6 mg/dL (ref 0.3–1.2)
Total Protein: 6.8 g/dL (ref 6.5–8.1)

## 2017-04-25 LAB — PROTIME-INR
INR: 1.46
Prothrombin Time: 17.6 seconds — ABNORMAL HIGH (ref 11.4–15.2)

## 2017-04-25 LAB — MAGNESIUM: MAGNESIUM: 1.9 mg/dL (ref 1.7–2.4)

## 2017-04-25 LAB — COOXEMETRY PANEL
Carboxyhemoglobin: 0.8 % (ref 0.5–1.5)
Methemoglobin: 0.6 % (ref 0.0–1.5)
O2 SAT: 79.6 %
TOTAL HEMOGLOBIN: 9.1 g/dL — AB (ref 12.0–16.0)
Total oxygen content: 10.1 mL/dL — ABNORMAL LOW (ref 15.0–23.0)

## 2017-04-25 LAB — ECHOCARDIOGRAM COMPLETE
Height: 67 in
WEIGHTICAEL: 2755.2 [oz_av]

## 2017-04-25 LAB — TROPONIN I: Troponin I: 0.71 ng/mL (ref <0.03)

## 2017-04-25 MED ORDER — SODIUM CHLORIDE 0.9% FLUSH: 10.0000 mL | INTRAVENOUS | Status: DC | PRN

## 2017-04-25 MED ORDER — NOREPINEPHRINE BITARTRATE 1 MG/ML IV SOLN
4.0000 ug/min | INTRAVENOUS | Status: DC
  Administered 2017-04-25: 4 ug/min via INTRAVENOUS
  Administered 2017-04-26: 8 ug/min via INTRAVENOUS
  Filled 2017-04-25 (×3): qty 4

## 2017-04-25 MED ORDER — CHLORHEXIDINE GLUCONATE CLOTH 2 % EX PADS
6.0000 | MEDICATED_PAD | Freq: Every day | CUTANEOUS | Status: DC
  Administered 2017-04-25 – 2017-04-30 (×6): 6 via TOPICAL

## 2017-04-25 MED ORDER — SODIUM CHLORIDE 0.9 % IV BOLUS (SEPSIS): 1000.0000 mL | Freq: Once | INTRAVENOUS | Status: AC

## 2017-04-25 MED ORDER — SODIUM CHLORIDE 0.9 % IV SOLN
INTRAVENOUS | Status: AC
  Administered 2017-04-25: 08:00:00 via INTRAVENOUS

## 2017-04-25 MED ORDER — IPRATROPIUM-ALBUTEROL 0.5-2.5 (3) MG/3ML IN SOLN
3.0000 mL | Freq: Four times a day (QID) | RESPIRATORY_TRACT | Status: DC
  Administered 2017-04-25 – 2017-04-29 (×17): 3 mL via RESPIRATORY_TRACT
  Filled 2017-04-25 (×17): qty 3

## 2017-04-25 MED ORDER — ATROPINE SULFATE 1 MG/10ML IJ SOSY
PREFILLED_SYRINGE | INTRAMUSCULAR | Status: AC
  Filled 2017-04-25: qty 10

## 2017-04-25 MED ORDER — SODIUM CHLORIDE 0.9 % IV BOLUS (SEPSIS)
1000.0000 mL | INTRAVENOUS | Status: DC | PRN
  Administered 2017-04-25 (×2): 1000 mL via INTRAVENOUS

## 2017-04-25 MED ORDER — SODIUM CHLORIDE 0.9% FLUSH
10.0000 mL | Freq: Two times a day (BID) | INTRAVENOUS | Status: DC
  Administered 2017-04-25 – 2017-04-29 (×10): 10 mL
  Administered 2017-04-30: 40 mL

## 2017-04-25 MED FILL — Medication: Qty: 1 | Status: AC

## 2017-04-25 NOTE — Progress Notes (Signed)
Attempted to call report to [facility] long ICU. Charge nurse unable to take report at this time and will call back. Report has been given to [facility].

## 2017-04-25 NOTE — Procedures (Addendum)
Procedure Note  Procedure: Central venous line placement  Preprocedure diagnosis: Hypotension, acute kidney injury, possible cardiogenic shock  Post procedure diagnosis: Same  Indications: [name] [name] is a 83 yo who had a cardiac arrest yesterday that was thought to be respiratory in nature. He remains intubated with minimal interaction.  He is starting to have some hypotension, potentially related to cardiogenic shock versus other sources, and will likely require pressors and further access for intervention. The risk and benefits were discussed with his wife, who gave consent over the phone.  Procedure: The patient was placed supine and his neck extended to the left.  The right chest and neck were prepped and draped in the normal sterile fashion.  One percent lidocaine was used to anesthesize the clavicular area, and then a needle was introduced under the clavicle on the right. After two passes, dark venous blood returned from the subclavian vein, and a wire was attempted to be passed using the Seldinger technique with some resistance.  The wire was removed and the syringe was replaced, again confirming the dark venous blood drawing back, and the wire was replaced and would not go past the 10-15cm mark.  At this time, the ultrasound was used to identify the right internal jugular vein.  The needle was introduced under ultrasound guidance and dark blood was drawn back. The wire was advanced using the Seldinger technique and passed without difficulty.  The skin was incised and the area dilated, and the catheter was placed over the wire.  The catheter was 14cm at the skin.  The catheter was secured in multiple locations and the biopatch placed.  The sterile dressing was placed. The patient tolerated the procedure well and remained critical in the ICU.   A CXR was ordered post procedure and will be followed up.   Curlene Labrum, MD [facility] [address] [place], [place]  [id number] [phone] (office)

## 2017-04-25 NOTE — Progress Notes (Signed)
*  PRELIMINARY RESULTS* Echocardiogram 2D Echocardiogram has been performed.  [name] [name] 04/25/2017, 1:33 PM

## 2017-04-25 NOTE — H&P (Signed)
PULMONARY / CRITICAL CARE MEDICINE   Name: [name] [name] MRN: [id number] DOB: Mar 06, 1931    ADMISSION DATE:  04/26/2017   CONSULTATION DATE:  04/25/2017  REFERRING MD:  Louellen Molder, M.D. / [facility]  CHIEF COMPLAINT:  Septic Shock  HISTORY OF PRESENT ILLNESS:  History obtained from the consulting physician as well as the electronic medical record as the patient is currently intubated and sedated.  Patient transferred to our facility by consulting physician due to absence of critical care consultation support. Patient has a known history of multiple medical issues including cOPD, coronary artery disease, chronic renal failure, and baseline dementia. Patient presented after a fall at home without weakness or dizziness. Patient had no head trauma at the time. Patient has experienced progressively decreased appetite with abdominal distention. No reported episodes of emesis or diarrhea. No recent medication changes. Patient was subsequently admitted to hospital and diagnosed with failure to thrive. On 10/3 he was noted to be progressively more hypoxic and transition to 6 L/m via nasal cannula with high flow nasal cannula. Patient was notably coughing as well. Patient's hypoxia was thought to be secondary to volume overload. He was subsequently found without a pulse at approximately 4:30 PM and after 5-7 minutes of CPR and 1 mg of IV epinephrine spontaneous circulation was regained. Patient was endotracheally intubated and transition to the ICU. With shock present today and no evidence of cardiogenic shock based upon echocardiogram hospitalist requested transfer to our facility for critical care medicine support and further workup.  PAST MEDICAL HISTORY :  Past Medical History:  Diagnosis Date  . Alzheimer disease   . Anemia   . BPH (benign prostatic hyperplasia)   . CAD (coronary artery disease)    s/p CABG in 1997 with known atretic LIMA to LAD and 40% LM.    Marland Kitchen Carotid artery stenosis    s/p left  CEA.> 20 yrs ago and > 70% right ICA stenosis - followed by Dr. Donnetta Hutching  . Chronic chest wall pain   . Chronic kidney disease (CKD), stage III (moderate) (HCC)   . COPD (chronic obstructive pulmonary disease) ([place])   . Cough    cough with productive white to yellow sputum.  . DEMENTIA    "short term memory issues"  . Depression with anxiety   . GERD (gastroesophageal reflux disease)   . Glaucoma    bilateral  . Hyperlipemia   . Hypertension   . Memory loss   . Monoclonal gammopathy   . Neuromuscular disorder (HCC)    fingertips numbness"  . On home oxygen therapy    at bedtime-2 L/m nasally..    . Peptic ulcer disease   . Prostatitis   . PVD (peripheral vascular disease) ([place])    s/p left renal artery stent and left iliac stent  . Retinal artery occlusion     PAST SURGICAL HISTORY: Past Surgical History:  Procedure Laterality Date  . BACK SURGERY  2011   lower back -disc  . CARDIAC SURGERY    . CATARACT EXTRACTION W/PHACO  09/18/2011   Procedure: CATARACT EXTRACTION PHACO AND INTRAOCULAR LENS PLACEMENT (IOC);  Surgeon: Elta Guadeloupe T. Gershon Crane, MD;  Location: [facility];  Service: Ophthalmology;  Laterality: Right;  CDE: 6.19  . COLONOSCOPY WITH PROPOFOL N/A 07/11/2015   Procedure: COLONOSCOPY WITH PROPOFOL;  Surgeon: Laurence Spates, MD;  Location: [facility];  Service: Endoscopy;  Laterality: N/A;  . CORONARY ARTERY BYPASS GRAFT  1997   "nerve pain at area"  . ESOPHAGOGASTRODUODENOSCOPY (EGD) WITH  PROPOFOL N/A 07/11/2015   Procedure: ESOPHAGOGASTRODUODENOSCOPY (EGD) WITH PROPOFOL;  Surgeon: Laurence Spates, MD;  Location: [facility];  Service: Endoscopy;  Laterality: N/A;  . Left CEA  2002   carotid stent-left-   . RENAL ARTERY STENT      No Known Allergies  No current facility-administered medications on file prior to encounter.    Current Outpatient Prescriptions on File Prior to Encounter  Medication Sig  . acetaminophen (TYLENOL) 500 MG tablet Take 1,000 mg by mouth every 6  (six) hours as needed for headache.  Marland Kitchen amoxicillin-clavulanate (AUGMENTIN) 875-125 MG tablet Take 1 tablet by mouth 2 (two) times daily. One po bid x 7 days  . aspirin 81 MG tablet Take 1 tablet (81 mg total) by mouth daily.  . calcitRIOL (ROCALTROL) 0.5 MCG capsule Take 0.5 mcg by mouth daily.  . Cholecalciferol (VITAMIN D) 2000 units CAPS Take 1 capsule by mouth every other day.  . ezetimibe (ZETIA) 10 MG tablet Take 1 tablet (10 mg total) by mouth daily.  . feeding supplement, ENSURE ENLIVE, (ENSURE ENLIVE) LIQD Take 237 mLs by mouth 2 (two) times daily between meals.  . fenofibrate 160 MG tablet Take 1 tablet (160 mg total) by mouth daily.  . ferrous sulfate 325 (65 FE) MG tablet Take 325 mg by mouth every morning.   . finasteride (PROSCAR) 5 MG tablet Take 5 mg by mouth every morning.   . fluticasone (FLONASE) 50 MCG/ACT nasal spray Place 2 sprays into both nostrils 2 (two) times daily.   Marland Kitchen ipratropium (ATROVENT) 0.06 % nasal spray Place 1 spray into both nostrils 4 (four) times daily.  Marland Kitchen ipratropium-albuterol (DUONEB) 0.5-2.5 (3) MG/3ML SOLN Take 3 mLs by nebulization every 4 (four) hours as needed. (Patient taking differently: Take 3 mLs by nebulization every 4 (four) hours as needed (for shortness of breath). )  . lansoprazole (PREVACID) 30 MG capsule Take 30 mg by mouth daily.   Marland Kitchen latanoprost (XALATAN) 0.005 % ophthalmic solution Place 1 drop into both eyes at bedtime.   Marland Kitchen lubiprostone (AMITIZA) 24 MCG capsule Take 24 mcg by mouth daily as needed for constipation.   . memantine (NAMENDA) 10 MG tablet Take 1 tablet (10 mg total) by mouth 2 (two) times daily.  . metoCLOPramide (REGLAN) 10 MG tablet Take 10 mg by mouth 2 (two) times daily. Reported on 09/30/2015  . metoprolol succinate (TOPROL-XL) 25 MG 24 hr tablet TAKE 1 TABLET EVERY DAY  . Multiple Vitamin (MULTIVITAMIN WITH MINERALS) TABS tablet Take 1 tablet by mouth daily.  . nitroGLYCERIN (NITROSTAT) 0.4 MG SL tablet Place 1 tablet  (0.4 mg total) under the tongue every 5 (five) minutes as needed for chest pain.  . polyethylene glycol (MIRALAX / GLYCOLAX) packet Take 17 g by mouth daily as needed for moderate constipation.   Marland Kitchen PROCTO-MED HC 2.5 % rectal cream Place 1 application rectally 2 (two) times daily.  . rosuvastatin (CRESTOR) 40 MG tablet Take 40 mg by mouth every evening.  . Tamsulosin HCl (FLOMAX) 0.4 MG CAPS Take 0.8 mg by mouth at bedtime.   . vitamin B-12 (CYANOCOBALAMIN) 1000 MCG tablet Take 1,000 mcg by mouth daily.  . metroNIDAZOLE (FLAGYL) 500 MG tablet Take 1 tablet (500 mg total) by mouth 3 (three) times daily. (Patient not taking: Reported on 05/21/2017)  . OXYGEN Inhale 2 L into the lungs at bedtime. 2lpm with sleep    FAMILY HISTORY:  Family History  Problem Relation Age of Onset  . Hypertension Father   .  Hypertension Mother   . High blood pressure Unknown   . High Cholesterol Unknown   . Anesthesia problems Neg Hx   . Hypotension Neg Hx   . Malignant hyperthermia Neg Hx   . Pseudochol deficiency Neg Hx      SOCIAL HISTORY: Social History   Social History  . Marital status: Married    Spouse name: Peter Congo  . Number of children: 8  . Years of education: 51   Occupational History  .  Retired    Retired   Social History Main Topics  . Smoking status: Former Smoker    Packs/day: 0.50    Years: 40.00    Types: Cigarettes    Quit date: 07/23/1978  . Smokeless tobacco: Never Used  . Alcohol use No  . Drug use: No  . Sexual activity: Not Asked   Other Topics Concern  . None   Social History Narrative   Patient lives at home with his wife. Peter Congo). Patient is retired. Patient has one Year of college.   Right handed.   Caffeine- sometimes   REVIEW OF SYSTEMS:  Unable to obtain with the patient intubated and sedated.  SUBJECTIVE: AS ABOVE.  VITAL SIGNS: BP 103/63   Pulse 79   Temp 98.2 F (36.8 C) (Oral)   Resp 14   Ht 5\' 7"  (1.702 m)   Wt 172 lb 3.2 oz (78.1 kg)    SpO2 100%   BMI 26.97 kg/m   HEMODYNAMICS: CVP:  [7 mmHg] 7 mmHg  VENTILATOR SETTINGS: Vent Mode: PRVC FiO2 (%):  [50 %-60 %] 60 % Set Rate:  [14 bmp-20 bmp] 14 bmp Vt Set:  [530 mL] 530 mL PEEP:  [5 cmH20] 5 cmH20 Plateau Pressure:  [12 cmH20-31 cmH20] 22 cmH20  INTAKE / OUTPUT: I/O last 3 completed shifts: In: 708.5 [I.V.:146; IV Piggyback:562.5] Out: 2050 [Urine:2050]  PHYSICAL EXAMINATION: General:  No acute distress. No family at bedside.  Integument:  Warm & dry. No rash on exposed skin. No bruising. Extremities:  No cyanosis or clubbing.  Lymphatics:  No appreciated cervical or supraclavicular lymphadenoapthy. HEENT:  Moist mucus membranes. No scleral injection or icterus. Endotracheal tube in place.  Cardiovascular:  Regular rate. No edema. No appreciable JVD.  Pulmonary:  Symmetric chest wall rise on ventilator. Clear bilaterally to auscultation. Abdomen: Soft. Normal bowel sounds. Nondistended.  Musculoskeletal:  Normal bulk and tone. No joint deformity or effusion appreciated. Neurological:  Sedated. No spontaneous movements. Pupils equal and symmetric.  Psychiatric:  Unable to assess due to intubated and sedated status.  LABS:  BMET  Recent Labs Lab 04/24/17 0536 04/24/17 1735 04/25/17 0409  NA 143 142 144  K 4.6 4.3 4.6  CL 112* 109 110  CO2 22 18* 26  BUN 37* 37* 39*  CREATININE 2.27* 2.40* 2.54*  GLUCOSE 118* 230* 113*    Electrolytes  Recent Labs Lab 04/27/2017 1220  04/24/17 0536 04/24/17 1735 04/25/17 0409  CALCIUM 9.9  < > 8.9 8.8* 8.6*  MG 2.1  --   --  2.0 1.9  < > = values in this interval not displayed.  CBC  Recent Labs Lab 04/23/17 0623 04/24/17 1735 04/25/17 0409  WBC 7.7 9.3 6.8  HGB 10.4* 9.8* 8.8*  HCT 31.8* 30.1* 27.2*  PLT 187 144* 147*    Coag's  Recent Labs Lab 04/25/17 1051  INR 1.46    Sepsis Markers  Recent Labs Lab 04/24/17 1735 04/24/17 2007 04/24/17 2304  LATICACIDVEN 7.3* 2.0* 1.3  ABG  Recent Labs Lab 04/24/17 1200 04/24/17 1734 04/25/17 0040  PHART 7.390 7.266* 7.317*  PCO2ART 32.1 43.7 44.3  PO2ART 67.7* 123* 104.0    Liver Enzymes  Recent Labs Lab 04/23/17 0623 04/24/17 1735 04/25/17 0409  AST 53* 883* 644*  ALT 43 567* 540*  ALKPHOS 35* 44 37*  BILITOT 0.7 0.5 0.6  ALBUMIN 3.3* 3.4* 3.0*    Cardiac Enzymes  Recent Labs Lab 04/23/17 0623 04/24/17 1900 04/25/17 0830  TROPONINI 0.21* 0.76* 0.71*    Glucose  Recent Labs Lab 05/14/2017 2105  GLUCAP 73    Imaging Ct Head Wo Contrast  Result Date: 04/24/2017 CLINICAL DATA:  Unexplained altered level of consciousness. Patient is on ventilator. EXAM: CT HEAD WITHOUT CONTRAST TECHNIQUE: Contiguous axial images were obtained from the base of the skull through the vertex without intravenous contrast. COMPARISON:  04/23/2017 FINDINGS: Brain: Mild cerebral atrophy. Ventricular dilatation consistent with central atrophy. Patchy low-attenuation changes in the deep white matter consistent small vessel ischemia. No mass effect or midline shift. No abnormal extra-axial fluid collections. Gray-white matter junctions are distinct. Basal cisterns are not effaced. No acute intracranial hemorrhage. Vascular: Internal carotid artery vascular calcifications. Skull: The calvarium appears intact.  No depressed skull fractures. Sinuses/Orbits: Paranasal sinuses and mastoid air cells are clear. Other: None. IMPRESSION: No acute intracranial abnormalities. Chronic atrophy and small vessel ischemic changes. Electronically Signed   By: Lucienne Capers M.D.   On: 04/24/2017 21:36   Dg Chest Port 1 View  Result Date: 04/25/2017 CLINICAL DATA:  Central line placement EXAM: PORTABLE CHEST 1 VIEW COMPARISON:  04/25/2017 FINDINGS: Interval placement of a RIGHT central venous line. No pneumothorax identified. Tip is in the distal SVC. Endotracheal tube NG tube unchanged. Low lung volumes and interstitial edema unchanged.  Small RIGHT effusion. IMPRESSION: No placement RIGHT central venous line without complication. Electronically Signed   By: Suzy Bouchard M.D.   On: 04/25/2017 12:34   Dg Chest Port 1 View  Result Date: 04/25/2017 CLINICAL DATA:  Check endotracheal tube placement EXAM: PORTABLE CHEST 1 VIEW COMPARISON:  04/24/2017 FINDINGS: Endotracheal tube and nasogastric catheter are again seen and stable in appearance. Cardiac shadow is mildly enlarged but stable. Postsurgical changes are noted. Diffuse fibrotic changes are noted throughout both lungs. No definitive acute infiltrate is seen. The overall inspiration is poor. IMPRESSION: Tubes and lines as described above. Electronically Signed   By: Inez Catalina M.D.   On: 04/25/2017 08:44   Dg Chest Port 1 View  Result Date: 04/24/2017 CLINICAL DATA:  Cardiac arrest EXAM: PORTABLE CHEST 1 VIEW COMPARISON:  Nine o'clock today FINDINGS: Endotracheal tube placed with its tip 1.7 cm from the carina. Heterogeneous opacities throughout both lungs are a combination of chronic lung disease and airspace opacities. Low lung volumes. Normal heart size. Postop changes. No pneumothorax. Overall, lungs are stable in appearance. IMPRESSION: Endotracheal tube placement as described. Stable appearance of the lungs. Electronically Signed   By: Marybelle Killings M.D.   On: 04/24/2017 18:17   Dg Chest Port 1v Same Day  Result Date: 04/24/2017 CLINICAL DATA:  NG tube placement EXAM: PORTABLE CHEST 1 VIEW COMPARISON:  Earlier today FINDINGS: NG tube placed beyond the fundus of the stomach. Stable appearance of the lungs. Stable endotracheal tube. No pneumothorax or pleural effusion. IMPRESSION: NG tube placed beyond the gastroesophageal junction and fundus of the stomach Stable endotracheal tube and appearance of the lungs. Electronically Signed   By: Marybelle Killings M.D.   On: 04/24/2017  19:08     STUDIES:  PFT 10/27/14: FVC 2.84 L (84%) FEV1 1.86 L (73%) FEV1/FVC 0.66 FEF 25/1.11 L  (43%) CT ABD/PELVIS W/O 10/1:  No acute process within the abdomen or pelvis. Re- demonstrated pulmonary fibrosis and emphysematous change. Bilateral fat containing inguinal hernias. CT HEAD W/O 10/3:  No acute intracranial abnormalities. Chronic atrophy and small vessel ischemic changes. PORT CXR 10/4:  Blunting of right costophrenic angle suggestive of pleural effusion. Patchy alveolar opacity on the right. Endotracheal tube with tip seemingly at the level of the carina. Right internal jugular central venous catheter in good position. Enteric feeding tube coursing below diaphragm and looping in what appears to be the fundus of the stomach. TTE 10/4:  Mild LVH with EF 55-60%. Diastolic function abnormal but indeterminate grade. Normal wall motion without regional abnormality. LA normal in size & RA poorly visualized. RV poorly visualized but appeared mild to moderately enlarged. Difficult to assess RV function. No aortic stenosis or significant regurgitation. Aortic root normal in size. No mitral stenosis or significant regurgitation. Mild tricuspid regurgitation. No pericardial effusion.  MICROBIOLOGY: MRSA PCR 10/3:  Negative  Blood Cultures x2 10/3 >>> Urine Culture 10/3 >>>  ANTIBIOTICS: Augmentin 10/2 - 10/3 Zosyn 10/3 >>> Vancomycin 10/3 >>>  SIGNIFICANT EVENTS: 10/01 - Admit to [facility] 10/03 - Pulseless arrest at outside hospital>>Found unresponsive at 4:30pm. Intubated by EDP. ROSC after 5-7 min of CPR & 1mg  Epi IV. 10/04 - Transfer to [facility]  LINES/TUBES: OETT 7.5 10/3 >>> R IJ CVL 10/4 >>> Foley 10/4 >>> OGT 10/4 >>> PIV   ASSESSMENT / PLAN:  Septic shock on vasopressors with post pEA cardiac arrest and acute on chronic hypoxemic repsiratory failure requiring mechanical ventilation. Suspected HCAP. AKI secondary to septic shock.   Plan: - titrate levophed keep MAP >65  - zosyn/vanco - follow cultures - send for sputum culture - wean down FIO2 as  tolerated. - keep on minimal sedation to assess mental status. Consider d/c versed and keep on propofol (on only 0.5mg  versed)     I have spent a total of 40  minutes of critical care time today caring for the patient and reviewing the patient's electronic medical record. This time is exclusive of any billable procedures. Patient is needing ICU due to respiratory failure requiring mechanical ventilation and septic shock requiring vasopressors   Pulmonary and [place] Pager: [phone]  04/25/2017, 5:39 PM

## 2017-04-25 NOTE — Patient Outreach (Signed)
[facility]) Care Management  04/25/2017  [name] [birth year] [id number]  [name] is an 83 year old gentleman from [place] with medical history which includes COPD, coronary artery disease, chronic renal failure, and baseline dementia. He was admitted to the hospital on 05/06/2017 with failure to thrive after a fall at home in setting of proogressively decreased anorexia and abdominal distention at home. [name] [name] has experienced progressive respiratory decline and at approximately 4:30pm today, became hypoxic and had cardiac arrest. He was resuscitated and subsequently transferred to [facility] where he is currently intubated/on ventilatory support in the ICU.    Plan: [name] [name] is known to [facility]. We will follow his progress closely.    [facility]  [phone]

## 2017-04-25 NOTE — Progress Notes (Signed)
PROGRESS NOTE                                                                                                                                                                                                             Patient Demographics:    [name] [name], is a 83 y.o. male, DOB - Nov 01, 1930, LXB:[id number]  Admit date - 05/03/2017   Admitting Physician Deneise Lever, MD  Outpatient Primary MD for the patient is Antony Contras, MD  LOS - 1  Outpatient Specialists:None  Chief Complaint  Patient presents with  . Fall  . Weakness       Brief Narrative   83 year old male with Alzheimer's dementia, coronary artery disease status post CABG in 55, COPD who presented with fall at home. Reportedly he had poor by mouth intake and abdominal distention. He was found to have acute kidney injury and elevated troponin along with failure to thrive.      Subjective:   Patient went into sudden cardiac arrest yesterday with subsequent ROSC after 5-7 minutes of CPR, intubated and admitted ICU. This am BP is low with MAP at 65.     Assessment  & Plan :   Principal problem Acute on chronic respiratory failure with hypoxia (HCC) On 10/3 Patient requiring high flow nasal cannula with coarse bibasilar crackles. Chest x-ray shows worsening bilateral lower density (R >L). ABG shows hypoxemia. Patient went into sudden cardiac arrest in the afternoon , required CPR and intubated at bedside with ROSC about 5-7 minutes. Transferred to ICU.  CXR appears clear without infiltrate or pulmonary vascular congestion. ABG this morning stable. check 2d echo. Elevated lactate post cardiac arrest resolved. continue fentanyl dn versed for sedation. Consulted surgery for central line placement and possible pressors for hypotension. discussed with PCCM. transfer to [facility] when bed available.  Hypotension  MAP>65. Onset since this am. 2 L NS bolus ordered.  Surgery consulted or central line placement for possible pressors. Check 2 d echo . Cardiology consult if cardiogenic shock of concern.   Active Problems: Acute on chronic kidney disease stage IV Secondary to dehydration and improved to baseline with IV hydration. Prn fluid boluses for low BP  Failure to thrive with poor by mouth intake. NPO. Added empiric abx for possible aspiration. Blood cx negative so far.    Chronic respiratory failure with  hypoxia (HCC)   COPD GOLD II WAS schedule DuoNeb's. Continue O2 via nasal cannula.    FTT (failure to thrive) in adult PT recommends SNF.  Elevated troponin (HCC) Possibly secondary to acute kidney injury/dehydration. EKG unremarkable. Troponins flat. No further ischemic workup per cardiology when seen earlier this admission. Check echo and if concerning consult cards again.  transaminitis  possibly shock liver from cardiac arrest vs cardiogenic shock. Monitor closely.  Coronary artery disease status post CABG Continue aspirin.  Alzheimer's dementia   Discussed with Dr Veleta Miners with PCCM on 10/3 who accepted pt at [facility] but no bed available yet. Have beds at [facility] and spoke with Dr Creig Hines but since cannot r/o cardiogenic shock , it would be better for patient to be transferred to [facility] instead. Possibly transfer to [facility] later this afternoon.  Code Status : Full code  Family Communication  : none at bedside  Disposition Plan  : transfer to [facility] once bed available  Barriers For Discharge : Active symptoms  Consults  :  None  Procedures  :  CT abdomen pelvis CT head  DVT Prophylaxis  : Subcutaneous heparin  Lab Results  Component Value Date   PLT 147 (L) 04/25/2017    Antibiotics  :  Augmentin  Anti-infectives    Start     Dose/Rate Route Frequency Ordered Stop   04/25/17 2000  vancomycin (VANCOCIN) IVPB 750 mg/150 ml premix     750 mg 150 mL/hr over 60 Minutes Intravenous Every 24 hours 04/24/17 2035     04/25/17 0400   piperacillin-tazobactam (ZOSYN) IVPB 3.375 g     3.375 g 12.5 mL/hr over 240 Minutes Intravenous Every 8 hours 04/24/17 2035     04/24/17 1900  vancomycin (VANCOCIN) 1,500 mg in sodium chloride 0.9 % 500 mL IVPB     1,500 mg 250 mL/hr over 120 Minutes Intravenous  Once 04/24/17 1831 04/24/17 2200   04/24/17 1845  piperacillin-tazobactam (ZOSYN) IVPB 3.375 g     3.375 g 100 mL/hr over 30 Minutes Intravenous  Once 04/24/17 1831 04/24/17 2100   04/24/17 1000  amoxicillin-clavulanate (AUGMENTIN) 500-125 MG per tablet 500 mg  Status:  Discontinued     1 tablet Oral 2 times daily 04/24/17 0846 04/24/17 1758   04/23/17 1800  amoxicillin-clavulanate (AUGMENTIN) 875-125 MG per tablet 1 tablet  Status:  Discontinued    Comments:  One po bid x 7 days     1 tablet Oral 2 times daily 04/23/17 1726 04/24/17 0846        Objective:   Vitals:   04/25/17 0700 04/25/17 0800 04/25/17 0843 04/25/17 0900  BP: (!) 87/62 100/60  (!) 80/61  Pulse: 74 68  72  Resp: 20 20  14   Temp:      TempSrc:      SpO2: 99% 100% 92% (!) 89%  Weight:      Height:        Wt Readings from Last 3 Encounters:  05/10/2017 78.1 kg (172 lb 3.2 oz)  04/17/17 72.6 kg (160 lb)  02/27/17 72.6 kg (160 lb)     Intake/Output Summary (Last 24 hours) at 04/25/17 1039 Last data filed at 04/25/17 0700  Gross per 24 hour  Intake            708.5 ml  Output             2050 ml  Net          -1341.5 ml  Physical Exam Gen: elderly male intubated and sedated  on vent  HEENT: dry mucosa, ET and OG tube Chest: clear breath sounds B/L CVS: Normal S!&S2, no murmurs GI: soft, NT, ND BS+, foley+ Musculoskeletal: warm, no edema     Data Review:    CBC  Recent Labs Lab 05/02/2017 1220 04/23/17 0623 04/24/17 1735 04/25/17 0409  WBC 8.2 7.7 9.3 6.8  HGB 10.4* 10.4* 9.8* 8.8*  HCT 31.5* 31.8* 30.1* 27.2*  PLT 196 187 144* 147*  MCV 97.5 97.5 99.0 100.4*  MCH 32.2 31.9 32.2 32.5  MCHC 33.0 32.7 32.6 32.4  RDW 16.1*  16.0* 16.9* 17.3*  LYMPHSABS 1.3  --   --   --   MONOABS 0.9  --   --   --   EOSABS 0.0  --   --   --   BASOSABS 0.0  --   --   --     Chemistries   Recent Labs Lab 05/17/2017 1220 04/23/17 0623 04/24/17 0536 04/24/17 1735 04/25/17 0409  NA 138 141 143 142 144  K 4.8 4.1 4.6 4.3 4.6  CL 102 109 112* 109 110  CO2 25 21* 22 18* 26  GLUCOSE 110* 86 118* 230* 113*  BUN 32* 26* 37* 37* 39*  CREATININE 3.12* 2.29* 2.27* 2.40* 2.54*  CALCIUM 9.9 9.1 8.9 8.8* 8.6*  MG 2.1  --   --  2.0 1.9  AST 90* 53*  --  883* 644*  ALT 55 43  --  567* 540*  ALKPHOS 41 35*  --  44 37*  BILITOT 0.5 0.7  --  0.5 0.6   ------------------------------------------------------------------------------------------------------------------ No results for input(s): CHOL, HDL, LDLCALC, TRIG, CHOLHDL, LDLDIRECT in the last 72 hours.  Lab Results  Component Value Date   HGBA1C 6.5 (H) 12/22/2013   ------------------------------------------------------------------------------------------------------------------ No results for input(s): TSH, T4TOTAL, T3FREE, THYROIDAB in the last 72 hours.  Invalid input(s): FREET3 ------------------------------------------------------------------------------------------------------------------ No results for input(s): VITAMINB12, FOLATE, FERRITIN, TIBC, IRON, RETICCTPCT in the last 72 hours.  Coagulation profile No results for input(s): INR, PROTIME in the last 168 hours.  No results for input(s): DDIMER in the last 72 hours.  Cardiac Enzymes  Recent Labs Lab 04/23/17 0105 04/23/17 0623 04/24/17 1900  TROPONINI 0.19* 0.21* 0.76*   ------------------------------------------------------------------------------------------------------------------    Component Value Date/Time   BNP 258.0 (H) 04/17/2017 1855   BNP 149.1 (H) 03/12/2016 1257    Inpatient Medications  Scheduled Meds: . chlorhexidine gluconate (MEDLINE KIT)  15 mL Mouth Rinse BID  . heparin  5,000  Units Subcutaneous Q8H  . ipratropium  1 spray Each Nare QID  . latanoprost  1 drop Both Eyes QHS  . mouth rinse  15 mL Mouth Rinse QID  . pantoprazole (PROTONIX) IV  40 mg Intravenous Daily  . tamsulosin  0.8 mg Oral QHS   Continuous Infusions: . sodium chloride 75 mL/hr at 04/25/17 0816  . fentaNYL infusion INTRAVENOUS 100 mcg/hr (04/25/17 0700)  . midazolam (VERSED) infusion 0.5 mg/hr (04/25/17 1015)  . piperacillin-tazobactam (ZOSYN)  IV Stopped (04/25/17 0850)  . sodium chloride 1,000 mL (04/25/17 1013)  . sodium chloride    . vancomycin     PRN Meds:.acetaminophen **OR** acetaminophen, fentaNYL (SUBLIMAZE) injection, fentaNYL (SUBLIMAZE) injection, midazolam, midazolam, ondansetron (ZOFRAN) IV, polyethylene glycol, sodium chloride  Micro Results Recent Results (from the past 240 hour(s))  MRSA PCR Screening     Status: None   Collection Time: 04/24/17  6:37 PM  Result Value Ref Range Status  MRSA by PCR NEGATIVE NEGATIVE Final    Comment:        The GeneXpert MRSA Assay (FDA approved for NASAL specimens only), is one component of a comprehensive MRSA colonization surveillance program. It is not intended to diagnose MRSA infection nor to guide or monitor treatment for MRSA infections.   Culture, blood (routine x 2)     Status: None (Preliminary result)   Collection Time: 04/24/17  8:07 PM  Result Value Ref Range Status   Specimen Description BLOOD RIGHT ARM  Final   Special Requests   Final    BOTTLES DRAWN AEROBIC AND ANAEROBIC Blood Culture results may not be optimal due to an excessive volume of blood received in culture bottles   Culture NO GROWTH < 12 HOURS  Final   Report Status PENDING  Incomplete  Culture, blood (routine x 2)     Status: None (Preliminary result)   Collection Time: 04/24/17  8:16 PM  Result Value Ref Range Status   Specimen Description BLOOD RIGHT ARM  Final   Special Requests   Final    BOTTLES DRAWN AEROBIC AND ANAEROBIC Blood Culture  adequate volume   Culture NO GROWTH < 12 HOURS  Final   Report Status PENDING  Incomplete    Radiology Reports Ct Abdomen Pelvis Wo Contrast  Result Date: 05/10/2017 CLINICAL DATA:  Patient status post fall.  Generalized weakness. EXAM: CT ABDOMEN AND PELVIS WITHOUT CONTRAST TECHNIQUE: Multidetector CT imaging of the abdomen and pelvis was performed following the standard protocol without IV contrast. COMPARISON:  CT abdomen pelvis 01/10/2017. FINDINGS: Lower chest: Cardiomegaly. Re- demonstrated emphysematous change with interstitial fibrosis. No pleural effusion. Hepatobiliary: Liver is normal in size and contour. Gallbladder is decompressed. Small stone in the gallbladder lumen. No intrahepatic or extrahepatic biliary ductal dilatation. Pancreas: Unremarkable Spleen: Unremarkable Adrenals/Urinary Tract: Normal adrenal glands. Re- demonstrated bilateral renal cysts. Kidneys are symmetric in size. No hydronephrosis. Large diverticulum off the posterior left urinary bladder. Stomach/Bowel: Moderate size hiatal hernia. Normal morphology of the stomach. Oral contrast material to the rectum. Normal appendix. Descending and sigmoid colonic diverticulosis. No evidence for acute diverticulitis. No evidence for bowel obstruction. No free fluid or free intraperitoneal air. Vascular/Lymphatic: Normal caliber abdominal aorta. Stent within the left renal artery. Peripheral calcified atherosclerotic plaque involving the abdominal aorta. Bilateral common iliac artery stents. Reproductive: Prostate is enlarged. Other: Left-greater-than-right bilateral fat containing inguinal hernias. Musculoskeletal: Lumbar spine degenerative changes. No aggressive or acute appearing osseous lesions. IMPRESSION: No acute process within the abdomen or pelvis. Re- demonstrated pulmonary fibrosis and emphysematous change. Bilateral fat containing inguinal hernias. Electronically Signed   By: Lovey Newcomer M.D.   On: 05/03/2017 17:19   Dg  Chest 2 View  Result Date: 05/19/2017 CLINICAL DATA:  Generalized weakness EXAM: CHEST  2 VIEW COMPARISON:  04/17/2017 FINDINGS: Cardiac shadow is stable. Postoperative changes are again seen. Patchy interstitial changes are again identified throughout both lungs and relatively stable accentuated by poor inspiratory effort. No acute bony abnormality is seen. No sizable effusion is noted. IMPRESSION: Chronic interstitial changes without acute abnormality. Electronically Signed   By: Inez Catalina M.D.   On: 04/26/2017 14:25   Dg Chest 2 View  Result Date: 04/17/2017 CLINICAL DATA:  Confusion EXAM: CHEST  2 VIEW COMPARISON:  01/10/2017, 06/29/2016, CT 06/08/2016 FINDINGS: Post sternotomy changes. Emphysematous changes. Coarse opacity within the bilateral lungs consistent with chronic interstitial change or fibrosis. Similar appearance compared to prior radiograph. No acute consolidation or pleural effusion. Stable  borderline cardiomegaly. No pneumothorax. IMPRESSION: Emphysematous changes and fibrosis. No definite acute infiltrate is seen Electronically Signed   By: Donavan Foil M.D.   On: 04/17/2017 19:31   Ct Head Wo Contrast  Result Date: 04/24/2017 CLINICAL DATA:  Unexplained altered level of consciousness. Patient is on ventilator. EXAM: CT HEAD WITHOUT CONTRAST TECHNIQUE: Contiguous axial images were obtained from the base of the skull through the vertex without intravenous contrast. COMPARISON:  05/02/2017 FINDINGS: Brain: Mild cerebral atrophy. Ventricular dilatation consistent with central atrophy. Patchy low-attenuation changes in the deep white matter consistent small vessel ischemia. No mass effect or midline shift. No abnormal extra-axial fluid collections. Gray-white matter junctions are distinct. Basal cisterns are not effaced. No acute intracranial hemorrhage. Vascular: Internal carotid artery vascular calcifications. Skull: The calvarium appears intact.  No depressed skull fractures.  Sinuses/Orbits: Paranasal sinuses and mastoid air cells are clear. Other: None. IMPRESSION: No acute intracranial abnormalities. Chronic atrophy and small vessel ischemic changes. Electronically Signed   By: Lucienne Capers M.D.   On: 04/24/2017 21:36   Ct Head Wo Contrast  Result Date: 04/26/2017 CLINICAL DATA:  Fall EXAM: CT HEAD WITHOUT CONTRAST TECHNIQUE: Contiguous axial images were obtained from the base of the skull through the vertex without intravenous contrast. COMPARISON:  Head CT 06/29/2016 FINDINGS: Brain: No mass lesion, intraparenchymal hemorrhage or extra-axial collection. No evidence of acute cortical infarct. There is periventricular hypoattenuation compatible with chronic microvascular disease. Vascular: No hyperdense vessel or unexpected calcification. Skull: Normal visualized skull base, calvarium and extracranial soft tissues. Sinuses/Orbits: No sinus fluid levels or advanced mucosal thickening. No mastoid effusion. Normal orbits. IMPRESSION: Chronic small vessel disease without acute abnormality. Electronically Signed   By: Ulyses Jarred M.D.   On: 05/13/2017 17:11   Dg Chest Port 1 View  Result Date: 04/25/2017 CLINICAL DATA:  Check endotracheal tube placement EXAM: PORTABLE CHEST 1 VIEW COMPARISON:  04/24/2017 FINDINGS: Endotracheal tube and nasogastric catheter are again seen and stable in appearance. Cardiac shadow is mildly enlarged but stable. Postsurgical changes are noted. Diffuse fibrotic changes are noted throughout both lungs. No definitive acute infiltrate is seen. The overall inspiration is poor. IMPRESSION: Tubes and lines as described above. Electronically Signed   By: Inez Catalina M.D.   On: 04/25/2017 08:44   Dg Chest Port 1 View  Result Date: 04/24/2017 CLINICAL DATA:  Cardiac arrest EXAM: PORTABLE CHEST 1 VIEW COMPARISON:  Nine o'clock today FINDINGS: Endotracheal tube placed with its tip 1.7 cm from the carina. Heterogeneous opacities throughout both lungs are  a combination of chronic lung disease and airspace opacities. Low lung volumes. Normal heart size. Postop changes. No pneumothorax. Overall, lungs are stable in appearance. IMPRESSION: Endotracheal tube placement as described. Stable appearance of the lungs. Electronically Signed   By: Marybelle Killings M.D.   On: 04/24/2017 18:17   Dg Chest Port 1 View  Result Date: 04/24/2017 CLINICAL DATA:  Worsening shortness of breath since yesterday. EXAM: PORTABLE CHEST 1 VIEW COMPARISON:  05/02/2017 and multiple previous FINDINGS: Previous median sternotomy and CABG. Background pattern of chronic emphysema and pulmonary scarring right more than left. Worsening pulmonary density particularly on the right, worrisome for developing edema or pneumonia. IMPRESSION: Previous CABG. Chronic lung disease. Worsening density right more than left which could be edema or pneumonia. Electronically Signed   By: Nelson Chimes M.D.   On: 04/24/2017 09:13   Dg Chest Port 1v Same Day  Result Date: 04/24/2017 CLINICAL DATA:  NG tube placement EXAM: PORTABLE CHEST 1  VIEW COMPARISON:  Earlier today FINDINGS: NG tube placed beyond the fundus of the stomach. Stable appearance of the lungs. Stable endotracheal tube. No pneumothorax or pleural effusion. IMPRESSION: NG tube placed beyond the gastroesophageal junction and fundus of the stomach Stable endotracheal tube and appearance of the lungs. Electronically Signed   By: Marybelle Killings M.D.   On: 04/24/2017 19:08    Time Spent in minutes  35   Louellen Molder M.D on 04/25/2017 at 10:39 AM  Between 7am to 7pm - Pager - [phone]  After 7pm go to [email or web address] - password [email or web address]  [facility] -  Office  [phone]

## 2017-04-25 NOTE — Progress Notes (Signed)
Tracheal Aspirate obtained/labelled/sent to lab per order, RN made aware.

## 2017-04-25 NOTE — Progress Notes (Signed)
SLP Cancellation Note  Patient Details Name: [name] [name] MRN: [id number] DOB: [birth year]   Cancelled treatment:       Reason Eval/Treat Not Completed: Patient's level of consciousness;Medical issues which prohibited therapy; Pt currently intubated and inappropriate for BSE. SLP will sign off and please reconsult when appropriate.   Pt known to this SLP from [place] completed 03/21/2017: <<Pt likely with intermittent episodes of aspiration due to mentation. His wife reports that Pt will sometimes cough on his own saliva. He has been consuming a modified regular diet texture at home (wife chops food) with NTL, but has been drinking thin water per wife. His wife reported that it was much easier to "get him to drink" liquids with use of a straw. He was assessed with a straw today and it is fine for him to use a straw when drinking NTL. Would recommend mechanical soft textures and NTL (straw ok and no need for chin tuck) during meals) and OK to have thin water by cup sip between meals (Pt should not use a straw with thin water and no chin tuck). Wife reports that a nurse instructed her to have her husband implement chin tuck, however this is not indicated and should not be recommended unless seen to be beneficial under objective assessment as some people may aspirate with a chin tuck.>>  Thank you,  [name] [name], [name]  [name] 04/25/2017, 3:28 PM

## 2017-04-25 NOTE — Progress Notes (Addendum)
  Blood pressure dropped to 62/46 mmHg with map <60. Surgery consulted and patient had a right IJ placed without compensation. Started on IV Levophed and MAP now improved >70. 2-D echo done showed normal LVEF of 55-60% with no wall motion abnormality and PA pressure of 46 mmHg. Cardiac consult appreciated who recommends his acute symptoms are unlikely cardiac and likely respiratory cause for his PEA. Co-ox is normal and BNP elevated to 900s.  Discussed with Dr. Ashok Cordia at [facility] low-grade with patient being transferred there (no ICU beds at [facility] yet).  Patient [facility] be transferred to [facility] ICU. Wife was notified of potential transfer during rounds this morning.

## 2017-04-25 NOTE — Progress Notes (Signed)
CXR reviewed. Line in SVC. No ptx. Ok to use.   Will sign off.   Curlene Labrum, MD [facility] [address] [place], [place] [address] [phone] (office)

## 2017-04-25 NOTE — Progress Notes (Signed)
CVP is 7, suggests he does not have severe volume overload though BNP is increased. Mixed venous O2 sat 80%, echo with normal LV function. RV poorly visualized, though looks to be somewhat enlarged with probably low normal function. At this time no strong evidence his shock is cardiogenic   Carlyle Dolly MD

## 2017-04-25 NOTE — Progress Notes (Addendum)
In to see patient at this time, patient noted to have unilateral pupil changes during neuro assessment. Left pupil 1 mm fixed, non reactive, R pupil 2 mm, reactive, sluggish.  MD Kim notified at this time, patient to have STAT Head CT.  Patient at the currently hemodynamically stable on the ventilator.  2050- The patient was transported to CT without any complications. CCM notified when the CT results were available. No further orders received at this time. Patient currently hemodynamically stable on the ventilator. Will continue to monitor the patient closely.

## 2017-04-25 NOTE — Progress Notes (Addendum)
Progress Note  Patient Name: [name] [name] Date of Encounter: 04/25/2017   Subjective   Patient with PEA arrest yesterday, asked by primary team to reevaluate from cardiac standpoint.   Inpatient Medications    Scheduled Meds: . atropine      . chlorhexidine gluconate (MEDLINE KIT)  15 mL Mouth Rinse BID  . heparin  5,000 Units Subcutaneous Q8H  . latanoprost  1 drop Both Eyes QHS  . mouth rinse  15 mL Mouth Rinse QID  . pantoprazole (PROTONIX) IV  40 mg Intravenous Daily   Continuous Infusions: . sodium chloride 75 mL/hr at 04/25/17 0816  . fentaNYL infusion INTRAVENOUS 100 mcg/hr (04/25/17 0700)  . midazolam (VERSED) infusion 0.5 mg/hr (04/25/17 1015)  . norepinephrine (LEVOPHED) Adult infusion    . piperacillin-tazobactam (ZOSYN)  IV Stopped (04/25/17 0850)  . sodium chloride 1,000 mL (04/25/17 1013)  . vancomycin     PRN Meds: acetaminophen **OR** acetaminophen, fentaNYL (SUBLIMAZE) injection, fentaNYL (SUBLIMAZE) injection, midazolam, midazolam, ondansetron (ZOFRAN) IV, sodium chloride   Vital Signs    Vitals:   04/25/17 1145 04/25/17 1200 04/25/17 1215 04/25/17 1230  BP:  (!) 80/54 (!) 62/46 (!) 70/54  Pulse: 69 74  64  Resp: _0 Temp:    (!) 97.5 F (36.4 C)  TempSrc:    Axillary  SpO2: 93% 93%  (!) 88%  Weight:      Height:        Intake/Output Summary (Last 24 hours) at 04/25/17 1248 Last data filed at 04/25/17 0700  Gross per 24 hour  Intake            708.5 ml  Output             1300 ml  Net           -591.5 ml   Filed Weights   04/23/2017 1132 05/04/2017 2058  Weight: 160 lb (72.6 kg) 172 lb 3.2 oz (78.1 kg)    Telemetry    SR and sinus brady- Personally Reviewed  ECG    SR, anterior T-wave inverions - Personally Reviewed  Physical Exam   GEN: No acute distress.   Neck: No JVD Cardiac: RRR, no murmurs, rubs, or gallops.  Respiratory: coarse bilateraly GI: Soft, nontender, non-distended  MS: No edema; No deformity. Neuro:   intubated and sedated Psych: intubated and sedated  Labs    Chemistry Recent Labs Lab 04/23/17 0623 04/24/17 0536 04/24/17 1735 04/25/17 0409  NA 141 143 142 144  K 4.1 4.6 4.3 4.6  CL 109 112* 109 110  CO2 21* 22 18* 26  GLUCOSE 86 118* 230* 113*  BUN 26* 37* 37* 39*  CREATININE 2.29* 2.27* 2.40* 2.54*  CALCIUM 9.1 8.9 8.8* 8.6*  PROT 7.4  --  7.5 6.8  ALBUMIN 3.3*  --  3.4* 3.0*  AST 53*  --  883* 644*  ALT 43  --  567* 540*  ALKPHOS 35*  --  44 37*  BILITOT 0.7  --  0.5 0.6  GFRNONAA 25* 25* 23* 22*  GFRAA 29* 29* 27* 25*  ANIONGAP _1 Hematology Recent Labs Lab 04/23/17 0623 04/24/17 1735 04/25/17 0409  WBC 7.7 9.3 6.8  RBC 3.26* 3.04* 2.71*  HGB 10.4* 9.8* 8.8*  HCT 31.8* 30.1* 27.2*  MCV 97.5 99.0 100.4*  MCH 31.9 32.2 32.5  MCHC 32.7 32.6 32.4  RDW 16.0* 16.9* 17.3*  PLT 187 144* 147*  Cardiac Enzymes Recent Labs Lab 05/03/2017 1720 04/23/17 0105 04/23/17 0623 04/24/17 1900  TROPONINI 0.25* 0.19* 0.21* 0.76*   No results for input(s): TROPIPOC in the last 168 hours.   BNPNo results for input(s): BNP, PROBNP in the last 168 hours.   DDimer No results for input(s): DDIMER in the last 168 hours.   Radiology    Ct Head Wo Contrast  Result Date: 04/24/2017 CLINICAL DATA:  Unexplained altered level of consciousness. Patient is on ventilator. EXAM: CT HEAD WITHOUT CONTRAST TECHNIQUE: Contiguous axial images were obtained from the base of the skull through the vertex without intravenous contrast. COMPARISON:  05/12/2017 FINDINGS: Brain: Mild cerebral atrophy. Ventricular dilatation consistent with central atrophy. Patchy low-attenuation changes in the deep white matter consistent small vessel ischemia. No mass effect or midline shift. No abnormal extra-axial fluid collections. Gray-white matter junctions are distinct. Basal cisterns are not effaced. No acute intracranial hemorrhage. Vascular: Internal carotid artery vascular calcifications.  Skull: The calvarium appears intact.  No depressed skull fractures. Sinuses/Orbits: Paranasal sinuses and mastoid air cells are clear. Other: None. IMPRESSION: No acute intracranial abnormalities. Chronic atrophy and small vessel ischemic changes. Electronically Signed   By: Lucienne Capers M.D.   On: 04/24/2017 21:36   Dg Chest Port 1 View  Result Date: 04/25/2017 CLINICAL DATA:  Central line placement EXAM: PORTABLE CHEST 1 VIEW COMPARISON:  04/25/2017 FINDINGS: Interval placement of a RIGHT central venous line. No pneumothorax identified. Tip is in the distal SVC. Endotracheal tube NG tube unchanged. Low lung volumes and interstitial edema unchanged. Small RIGHT effusion. IMPRESSION: No placement RIGHT central venous line without complication. Electronically Signed   By: Suzy Bouchard M.D.   On: 04/25/2017 12:34   Dg Chest Port 1 View  Result Date: 04/25/2017 CLINICAL DATA:  Check endotracheal tube placement EXAM: PORTABLE CHEST 1 VIEW COMPARISON:  04/24/2017 FINDINGS: Endotracheal tube and nasogastric catheter are again seen and stable in appearance. Cardiac shadow is mildly enlarged but stable. Postsurgical changes are noted. Diffuse fibrotic changes are noted throughout both lungs. No definitive acute infiltrate is seen. The overall inspiration is poor. IMPRESSION: Tubes and lines as described above. Electronically Signed   By: Inez Catalina M.D.   On: 04/25/2017 08:44   Dg Chest Port 1 View  Result Date: 04/24/2017 CLINICAL DATA:  Cardiac arrest EXAM: PORTABLE CHEST 1 VIEW COMPARISON:  Nine o'clock today FINDINGS: Endotracheal tube placed with its tip 1.7 cm from the carina. Heterogeneous opacities throughout both lungs are a combination of chronic lung disease and airspace opacities. Low lung volumes. Normal heart size. Postop changes. No pneumothorax. Overall, lungs are stable in appearance. IMPRESSION: Endotracheal tube placement as described. Stable appearance of the lungs. Electronically  Signed   By: Marybelle Killings M.D.   On: 04/24/2017 18:17   Dg Chest Port 1 View  Result Date: 04/24/2017 CLINICAL DATA:  Worsening shortness of breath since yesterday. EXAM: PORTABLE CHEST 1 VIEW COMPARISON:  05/11/2017 and multiple previous FINDINGS: Previous median sternotomy and CABG. Background pattern of chronic emphysema and pulmonary scarring right more than left. Worsening pulmonary density particularly on the right, worrisome for developing edema or pneumonia. IMPRESSION: Previous CABG. Chronic lung disease. Worsening density right more than left which could be edema or pneumonia. Electronically Signed   By: Nelson Chimes M.D.   On: 04/24/2017 09:13   Dg Chest Port 1v Same Day  Result Date: 04/24/2017 CLINICAL DATA:  NG tube placement EXAM: PORTABLE CHEST 1 VIEW COMPARISON:  Earlier today FINDINGS: NG  tube placed beyond the fundus of the stomach. Stable appearance of the lungs. Stable endotracheal tube. No pneumothorax or pleural effusion. IMPRESSION: NG tube placed beyond the gastroesophageal junction and fundus of the stomach Stable endotracheal tube and appearance of the lungs. Electronically Signed   By: Marybelle Killings M.D.   On: 04/24/2017 19:08    Cardiac Studies     Patient Profile     [name] [name] is a 83 y.o. male with a hx of CAD, s/p CABG 1997, (LIMA to LAD, 40% ostial LM,), Peripheral vascular disease with stent to the left iliac artery and renal artery stenosis status post left renal artery stent, carotid artery stenosis with left CEA, hypertension, dementia, chronic kidney disease, chronic noncardiac chest wall pain, COPD, and dyslipidemia; who is being seen today for the evaluation of  elevated troponin with known history of CADat the request of Dr. Sarajane Jews, Hospitalist.    Assessment & Plan    1. Fall - patient admitted after fall, presented with AKI, dehydration, abdominal pain, and encephalopathy. Notes indicate overall failure to thrive over the last several weeks.  -  on admission hypotensive, hypoxic.   2. Elevated troponin/ History of CAD with prior CABG - in setting of initial hypotension and hypoxia. Thought to be demand ischemia - peak trop 0.25 and trended down initially -repeat troponin post event 0.76 after PEA arrest yesterday. EKG with woresning of anterior TWIs  3. Dementia - primary team notes mention history of Alzheimers  4. Respiratory failure - admitted with hypoxia - worsening respiratory status yesterday, hypoxic by ABG yesterday - patient found unresponsive yesterday afternoon, nursing notes mention nasal canula had been removed by patient. Code was called and CPR started  5. Cardiac arrest - patient found unresponsive yesterday afternoon, nursing notes mention nasal canula had been removed by patient. Code was called and CPR started. From notes had some worsening respiratory status that morning, was on high flow nasal canula. Patient intubated, 8-10 minutes of CPR. Code note describes PEA - CXR prior to event showed worsening density R>L, question pneumonia vs edema.  - EKG after event SR, woresning of baseline anterior T-wave inversions  - clinical evidence thus far would support a respiratory driven PEA arrest. Do not suspect a primary cardiac event at this time. Cycle enzymes post arrest, we will f/u echo results. Would suspect some troponin elevation due to global hyperperfusion. We will check a BNP, CVP, co-ox. I have contacted echo staff to complete his study this afternoon. Patient hypotensive, aggree with staring levophed, he is awating transfer to [facility].    For questions or updates, please contact [place] Please consult [email or web address] for contact info under Cardiology/STEMI.      Merrily Pew, MD  04/25/2017, 12:48 PM

## 2017-04-25 NOTE — Progress Notes (Signed)
PT Cancellation Note  Patient Details Name: [name] [name] MRN: [id number] DOB: [birth year]   Cancelled Treatment:    Reason Eval/Treat Not Completed: Medical issues which prohibited therapy.  Patient transferred to a higher level of care and will need new physical therapy consult to reassess for functional mobility when patient medically stable.  Thank you.   7:59 AM, 04/25/17 Lonell Grandchild, MPT Physical Therapist with [facility] [phone] office [phone] mobile phone

## 2017-04-26 ENCOUNTER — Inpatient Hospital Stay (HOSPITAL_COMMUNITY)
Admit: 2017-04-26 | Discharge: 2017-04-26 | Disposition: A | Discharge status: Home / Self Care | Payer: Medicare HMO | Attending: Acute Care | Admitting: Acute Care

## 2017-04-26 ENCOUNTER — Inpatient Hospital Stay (HOSPITAL_COMMUNITY): Payer: Medicare HMO

## 2017-04-26 DIAGNOSIS — J9601 Acute respiratory failure with hypoxia: Secondary | ICD-10-CM

## 2017-04-26 LAB — BASIC METABOLIC PANEL
ANION GAP: 11 (ref 5–15)
BUN: 46 mg/dL — ABNORMAL HIGH (ref 6–20)
CALCIUM: 8.3 mg/dL — AB (ref 8.9–10.3)
CO2: 21 mmol/L — ABNORMAL LOW (ref 22–32)
CREATININE: 4.25 mg/dL — AB (ref 0.61–1.24)
Chloride: 113 mmol/L — ABNORMAL HIGH (ref 101–111)
GFR calc Af Amer: 14 mL/min — ABNORMAL LOW (ref >60)
GFR, EST NON AFRICAN AMERICAN: 12 mL/min — AB (ref >60)
GLUCOSE: 114 mg/dL — AB (ref 65–99)
Potassium: 4.9 mmol/L (ref 3.5–5.1)
Sodium: 145 mmol/L (ref 135–145)

## 2017-04-26 LAB — GLUCOSE, CAPILLARY
GLUCOSE-CAPILLARY: 116 mg/dL — AB (ref 65–99)
GLUCOSE-CAPILLARY: 116 mg/dL — AB (ref 65–99)

## 2017-04-26 LAB — BLOOD GAS, ARTERIAL
ACID-BASE DEFICIT: 9 mmol/L — AB (ref 0.0–2.0)
ACID-BASE DEFICIT: 7 mmol/L — AB (ref 0.0–2.0)
Bicarbonate: 19.3 mmol/L — ABNORMAL LOW (ref 20.0–28.0)
Bicarbonate: 18.7 mmol/L — ABNORMAL LOW (ref 20.0–28.0)
DRAWN BY: 308601
DRAWN BY: 295031
FIO2: 40
FIO2: 40
MECHVT: 550 mL
MECHVT: 530 mL
O2 Saturation: 92.6 %
O2 Saturation: 91.1 %
PEEP: 5 cmH2O
PEEP: 5 cmH2O
PO2 ART: 82.6 mmHg — AB (ref 83.0–108.0)
Patient temperature: 37.7
Patient temperature: 37
RATE: 14 resp/min
RATE: 25 resp/min
pCO2 arterial: 58.7 mmHg — ABNORMAL HIGH (ref 32.0–48.0)
pCO2 arterial: 41.2 mmHg (ref 32.0–48.0)
pH, Arterial: 7.148 — CL (ref 7.350–7.450)
pH, Arterial: 7.28 — ABNORMAL LOW (ref 7.350–7.450)
pO2, Arterial: 66.3 mmHg — ABNORMAL LOW (ref 83.0–108.0)

## 2017-04-26 LAB — URINE CULTURE: CULTURE: NO GROWTH

## 2017-04-26 LAB — CBC
HEMATOCRIT: 28.9 % — AB (ref 39.0–52.0)
Hemoglobin: 9 g/dL — ABNORMAL LOW (ref 13.0–17.0)
MCH: 32.4 pg (ref 26.0–34.0)
MCHC: 31.1 g/dL (ref 30.0–36.0)
MCV: 104 fL — AB (ref 78.0–100.0)
PLATELETS: 138 10*3/uL — AB (ref 150–400)
RBC: 2.78 MIL/uL — ABNORMAL LOW (ref 4.22–5.81)
RDW: 17.2 % — AB (ref 11.5–15.5)
WBC: 7.4 10*3/uL (ref 4.0–10.5)

## 2017-04-26 LAB — MAGNESIUM: Magnesium: 1.8 mg/dL (ref 1.7–2.4)

## 2017-04-26 LAB — PHOSPHORUS: Phosphorus: 6.3 mg/dL — ABNORMAL HIGH (ref 2.5–4.6)

## 2017-04-26 LAB — CORTISOL: CORTISOL PLASMA: 14.8 ug/dL

## 2017-04-26 MED ORDER — PIPERACILLIN-TAZOBACTAM IN DEX 2-0.25 GM/50ML IV SOLN
2.2500 g | Freq: Four times a day (QID) | INTRAVENOUS | Status: DC
  Administered 2017-04-26 – 2017-04-30 (×17): 2.25 g via INTRAVENOUS
  Filled 2017-04-26 (×18): qty 50

## 2017-04-26 MED ORDER — MAGNESIUM SULFATE IN D5W 1-5 GM/100ML-% IV SOLN
1.0000 g | Freq: Once | INTRAVENOUS | Status: AC
  Administered 2017-04-26: 1 g via INTRAVENOUS
  Filled 2017-04-26: qty 100

## 2017-04-26 NOTE — Progress Notes (Signed)
CSW following for disposition. Pt transferred from [facility]. Prior to transfer SNF was recommended per physical therapy eval and previous CSW spoke with family who had not yet made decision re desired DC plan. CSW spoke with pt's wife today who states that at this time they feel they will want to pursue SNF at DC. Understands pt's insurance requires pre-authorization for SNF admission. Requested referrals to [facility] area when appropriate. Sent pt's information to requested facilities, however began FL2 and will complete and plan further once more of pt's care needs closer to DC are known. (Pt currently on ventilator, NPO, etc.)   Sharren Bridge, MSW, [place] [facility] 04/26/2017 [phone]

## 2017-04-26 NOTE — Care Management Note (Signed)
Case Management Note  Patient Details  Name: [name] [name] MRN: [id number] Date of Birth: 12-25-1930  Subjective/Objective:                  Intubated and sedated  Action/Plan: Date:  April 26, 2017 Chart reviewed for concurrent status and case management needs.  Will continue to follow patient progress.  Discharge Planning: following for needs  Expected discharge date: [id number]  Velva Harman, BSN, [place], [place]   Expected Discharge Date:  04/23/17               Expected Discharge Plan:  Home/Self Care  In-House Referral:     Discharge planning Services  CM Consult  Post Acute Care Choice:    Choice offered to:     DME Arranged:    DME Agency:     HH Arranged:    HH Agency:     Status of Service:  In process, will continue to follow  If discussed at Long Length of Stay Meetings, dates discussed:    Additional Comments:  Leeroy Cha, RN 04/26/2017, 8:30 AM

## 2017-04-26 NOTE — Progress Notes (Signed)
Initial Nutrition Assessment  DOCUMENTATION CODES:   Not applicable  INTERVENTION:    Monitor for palliative goals of care  Monitor for diet advancement/toleration post extubation  If tube tube feeding desired: Vital AF 1.2 @ goal rate of 60 ml/hr Provides: 1728 kcals, 108 grams protein, 1166 ml free water. This meets 103% of calorie needs and 100% of protein needs.   NUTRITION DIAGNOSIS:   Inadequate oral intake related to inability to eat as evidenced by NPO status.  GOAL:   Provide needs based on ASPEN/SCCM guidelines  MONITOR:   TF tolerance, Vent status, Weight trends, Labs, Diet advancement  REASON FOR ASSESSMENT:   Ventilator    ASSESSMENT:   Pt with PMH significant for COPD, CAD, CKD, and dementia. Presents this admission with generalized weakness resulting in a mechanical fall at home. Admitted for failure to thrive in adult. On 10/3 pt became progessively hypoxic, was found without a pulse, required 5-7 minutes of CPR.   Patient is currently intubated on ventilator support MV: 13.2 L/min Temp (24hrs), Avg:99.7 F (37.6 C), Min:97.5 F (36.4 C), Max:100 F (37.8 C) Propofol: None BP: 130/64 MAP: 86  No family at bedside to provide nutrition history. Pt was placed on dysphagia 2 diet 10/2 before intubation, and consumed 25% of one meal. MD notes pt experienced progressively declining appetite with abdominal distention prior to admission. Will try to obtain more history once family is present.   Records indicate pt has maintained his weight of 160-165 lb since October 2017. Do not suspect any significant wt loss.   Nutrition-Focused physical exam completed. Findings are mild fat depletion, mild to moderate muscle depletion, and moderate generalized edema. Fluid accumulation may be masking losses.   Medications reviewed and include: fentanyl, magnesium sulfate, versed, levophed, IV abx Labs reviewed: BUN 46 (H) Creatinine 4.25 (H) Phosphorus 6.3 (H)  Diet  Order:  Diet NPO time specified  Skin:   (stage I sacrum)  Last BM:  04/25/17  Height:   Ht Readings from Last 1 Encounters:  05/10/2017 5\' 7"  (1.702 m)    Weight:   Wt Readings from Last 1 Encounters:  05/20/2017 172 lb 3.2 oz (78.1 kg)    Ideal Body Weight:  67.3 kg  BMI:  Body mass index is 26.97 kg/m.  Estimated Nutritional Needs:   Kcal:  1681 kcal (PSU)  Protein:  105-115 grams (1.3-1.5 g/kg)  Fluid:  >1.6 L/day  EDUCATION NEEDS:   No education needs identified at this time  [place], LDN Clinical Nutrition Pager # - [phone]

## 2017-04-26 NOTE — Plan of Care (Signed)
  Interdisciplinary Goals of Care Family Meeting   Date carried out:: 04/26/2017  Location of the meeting: Bedside  Member's involved: Physician and Family Member or next of kin  Durable Power of Attorney or Loss adjuster, chartered: Wife     Discussion: We discussed goals of care for [name] [name] .  Discussed patient's lack of neurological improvement and multi-system organ failure/dysfunction with family including wife and children at bedside. We discussed continued medical support versus transitioning to full comfort with Morphine for palliation. Family wish to discuss this further. Also offered hospital chaplain for spiritual support.   Code status: Full Code  Disposition: Continue current acute care  Time spent for the meeting: 11 minutes   Tera Partridge 04/26/2017, 1:32 PM

## 2017-04-26 NOTE — Procedures (Signed)
EEG Report  Clinical History:  Cardiac arrest with acute renal failure.  Technical Summary:  A 19 channel digital EEG recording was performed using the 10-20 international system of electrode placement.  Bipolar and Referential montages were used.  The total recording time was approx 20 minutes.  Findings:  This is a very low voltage recording.  Average background frequencies are about 5 Hz and symmetrical between both hemispheres.  There is reactivity to stimulation on the EEG.  The patient also has some spontaneous movements.  Throughout the recording there are no epileptiform discharges or electrographic seizures present.    Impression:  This is an abnormal EEG.  There is evidence of moderate generalized low voltage slowing of brain activity consistent with anoxic cortical injury.  Prognosis is guarded, but not dismal based on some EEG characteristics.  The patient is not in non-convulsive status epilepticus.     Rogue Jury, MS, MD

## 2017-04-26 NOTE — Progress Notes (Signed)
PULMONARY / CRITICAL CARE MEDICINE   Name: [name] [name] MRN: [id number] DOB: 03/12/1931    ADMISSION DATE:  05/14/2017   CONSULTATION DATE:  04/25/2017  REFERRING MD:  Louellen Molder, M.D. / [facility]  CHIEF COMPLAINT:  Septic Shock  HISTORY OF PRESENT ILLNESS:  History obtained from the consulting physician as well as the electronic medical record as the patient is currently intubated and sedated.  Patient transferred to our facility by consulting physician due to absence of critical care consultation support. Patient has a known history of multiple medical issues including COPD, coronary artery disease, chronic renal failure, and baseline dementia. Patient presented after a fall at home without weakness or dizziness. Patient had no head trauma at the time. Patient has experienced progressively decreased appetite with abdominal distention. No reported episodes of emesis or diarrhea. No recent medication changes. Patient was subsequently admitted to hospital and diagnosed with failure to thrive. On 10/3 he was noted to be progressively more hypoxic and transition to 6 L/m via nasal cannula with high flow nasal cannula. Patient was notably coughing as well. Patient's hypoxia was thought to be secondary to volume overload. He was subsequently found without a pulse at approximately 4:30 PM and after 5-7 minutes of CPR and 1 mg of IV epinephrine spontaneous circulation was regained. Patient was endotracheally intubated and transition to the ICU. With shock present today and no evidence of cardiogenic shock based upon echocardiogram hospitalist requested transfer to our facility for critical care medicine support and further workup.  SUBJECTIVE: Elderly male post PEA arrest  intubated, off sedation  and not following commands, on pressor support.  VITAL SIGNS: BP 130/64   Pulse 89   Temp 99.3 F (37.4 C)   Resp (!) 25   Ht 5\' 7"  (1.702 m)   Wt 172 lb 3.2 oz (78.1 kg)   SpO2 100%   BMI 26.97 kg/m    HEMODYNAMICS: CVP:  [5 mmHg-17 mmHg] 14 mmHg  VENTILATOR SETTINGS: Vent Mode: PRVC FiO2 (%):  [40 %-60 %] 40 % Set Rate:  [14 bmp-25 bmp] 25 bmp Vt Set:  [530 mL] 530 mL PEEP:  [5 cmH20] 5 cmH20 Plateau Pressure:  [10 cmH20-22 cmH20] 21 cmH20  INTAKE / OUTPUT: I/O last 3 completed shifts: In: 1492.7 [I.V.:705.2; IV Piggyback:787.5] Out: 1751 [WCHEN:2778]  PHYSICAL EXAMINATION: General:  Elderly male intubated and sedated, following no commands  Integument:  Warm and dry, no mottling Extremities:  No cyanosis or clubbing.  Lymphatics:  No appreciated cervical or supraclavicular lymphadenoapthy. HEENT:  MM pink and moist, No scleral injection or icterus. Oral Endotracheal tube in place. OG tube in place with scant drainage Cardiovascular:RRR, No edema. No JVD, No MRG  Pulmonary: Symmetrical respirations, breath sounds with faint rales per bases,synchronous with vent  Abdomen: Soft. Nondistended., faint BS with OG clamped  Musculoskeletal:  Normal bulk and tone. No obvious deformity noted Neurological:  No spontaneous movements. Pupils equal and symmetric. Not following commands Psychiatric:  UTA  LABS:  BMET  Recent Labs Lab 04/24/17 1735 04/25/17 0409 04/26/17 0524  NA 142 144 145  K 4.3 4.6 4.9  CL 109 110 113*  CO2 18* 26 21*  BUN 37* 39* 46*  CREATININE 2.40* 2.54* 4.25*  GLUCOSE 230* 113* 114*    Electrolytes  Recent Labs Lab 04/24/17 1735 04/25/17 0409 04/26/17 0524  CALCIUM 8.8* 8.6* 8.3*  MG 2.0 1.9 1.8  PHOS  --   --  6.3*    CBC  Recent Labs Lab  04/24/17 1735 04/25/17 0409 04/26/17 0524  WBC 9.3 6.8 7.4  HGB 9.8* 8.8* 9.0*  HCT 30.1* 27.2* 28.9*  PLT 144* 147* 138*    Coag's  Recent Labs Lab 04/25/17 1051  INR 1.46    Sepsis Markers  Recent Labs Lab 04/24/17 1735 04/24/17 2007 04/24/17 2304  LATICACIDVEN 7.3* 2.0* 1.3    ABG  Recent Labs Lab 04/25/17 0040 04/26/17 0518 04/26/17 0810  PHART 7.317* 7.148*  7.280*  PCO2ART 44.3 58.7* 41.2  PO2ART 104.0 82.6* 66.3*    Liver Enzymes  Recent Labs Lab 04/23/17 0623 04/24/17 1735 04/25/17 0409  AST 53* 883* 644*  ALT 43 567* 540*  ALKPHOS 35* 44 37*  BILITOT 0.7 0.5 0.6  ALBUMIN 3.3* 3.4* 3.0*    Cardiac Enzymes  Recent Labs Lab 04/23/17 0623 04/24/17 1900 04/25/17 0830  TROPONINI 0.21* 0.76* 0.71*    Glucose  Recent Labs Lab 05/12/2017 2105  GLUCAP 73    Imaging Dg Chest Port 1 View  Result Date: 04/26/2017 CLINICAL DATA:  Endotracheal tube moved. EXAM: PORTABLE CHEST 1 VIEW COMPARISON:  04/25/2017 FINDINGS: Endotracheal tube tip measures 1.9 cm above the carina. Enteric tube tip is off the field of view but below the left hemidiaphragm. Right central venous catheter tip is over the cavoatrial junction region. No pneumothorax. Postoperative changes in the mediastinum. Shallow inspiration with atelectasis in the lung bases. Cardiac enlargement. Mild perihilar infiltration likely due to edema although multifocal pneumonia could also have this appearance. No pleural effusions. IMPRESSION: Appliances appear in satisfactory position. Shallow inspiration with atelectasis in the lung bases. Bilateral perihilar infiltration or edema. Electronically Signed   By: Lucienne Capers M.D.   On: 04/26/2017 02:57   Dg Chest Port 1 View  Result Date: 04/25/2017 CLINICAL DATA:  Admitting Note: medical history which includes COPD, coronary artery disease, chronic renal failure, and baseline dementia. He was admitted to the hospital on 05/19/2017 with failure to thrive after a fall at home. EXAM: PORTABLE CHEST 1 VIEW COMPARISON:  04/25/2017 FINDINGS: Endotracheal tube is in place, estimated to be approximately 1.5 cm above the carina. Nasogastric tube is in place, tip off the image beyond the level of the mid stomach. A right IJ central line tip overlies the level of the superior vena cava. Status post median sternotomy and CABG. The heart is enlarged.  Shallow lung inflation. There are prominent interstitial markings, likely representing pulmonary edema. There has been slight improvement in aeration over recent exams. IMPRESSION: 1. Shallow lung inflation. 2. Slightly improved interstitial edema. Electronically Signed   By: Nolon Nations M.D.   On: 04/25/2017 20:37   Dg Chest Port 1 View  Result Date: 04/25/2017 CLINICAL DATA:  Central line placement EXAM: PORTABLE CHEST 1 VIEW COMPARISON:  04/25/2017 FINDINGS: Interval placement of a RIGHT central venous line. No pneumothorax identified. Tip is in the distal SVC. Endotracheal tube NG tube unchanged. Low lung volumes and interstitial edema unchanged. Small RIGHT effusion. IMPRESSION: No placement RIGHT central venous line without complication. Electronically Signed   By: Suzy Bouchard M.D.   On: 04/25/2017 12:34     STUDIES:   PFT 10/27/14: FVC 2.84 L (84%) FEV1 1.86 L (73%) FEV1/FVC 0.66 FEF 25/1.11 L (43%) CT ABD/PELVIS W/O 10/1:  No acute process within the abdomen or pelvis. Re- demonstrated pulmonary fibrosis and emphysematous change. Bilateral fat containing inguinal hernias. CT HEAD W/O 10/3:  No acute intracranial abnormalities. Chronic atrophy and small vessel ischemic changes. PORT CXR 10/4:  Blunting of  right costophrenic angle suggestive of pleural effusion. Patchy alveolar opacity on the right. Endotracheal tube with tip seemingly at the level of the carina. Right internal jugular central venous catheter in good position. Enteric feeding tube coursing below diaphragm and looping in what appears to be the fundus of the stomach. TTE 10/4:  Mild LVH with EF 55-60%. Diastolic function abnormal but indeterminate grade. Normal wall motion without regional abnormality. LA normal in size & RA poorly visualized. RV poorly visualized but appeared mild to moderately enlarged. Difficult to assess RV function. No aortic stenosis or significant regurgitation. Aortic root normal in size. No mitral  stenosis or significant regurgitation. Mild tricuspid regurgitation. No pericardial effusion.  MICROBIOLOGY: MRSA PCR 10/3:  Negative  Blood Cultures x2 10/3 >>> Urine Culture 10/3 >>> Sputum Culture 10/4>>  ANTIBIOTICS: Augmentin 10/2 - 10/3 Zosyn 10/3 >>> Vancomycin 10/3 >>>  SIGNIFICANT EVENTS: 10/01 - Admit to [facility] 10/03 - Pulseless arrest at outside hospital>>Found unresponsive at 4:30pm. Intubated by EDP. ROSC after 5-7 min of CPR & 1mg  Epi IV. 10/04 - Transfer to [facility]  LINES/TUBES: OETT 7.5 10/3 >>> R IJ CVL 10/4 >>> Foley 10/4 >>> OGT 10/4 >>> PIV   ASSESSMENT / PLAN:  Septic shock on vasopressors with post pEA cardiac arrest and acute on chronic hypoxemic repsiratory failure requiring mechanical ventilation. Suspected HCAP. Progressive AKI suspected secondary to septic shock.    PULMONARY A: Acute on chronic hypoxemic Respiratory Failure 2/2  Requiring mechanical ventilation/ suspected HCAP 10/5/ CXR:  atelectasis in the lung bases. Bilateral perihilar infiltration / edema. Respiratory Metabolic Acidosis overnight P:   Titrate FIO2 / Oxygen for saturations 88- 92% ( COPD) ABG 10/5 1600 ( Assure continued improvement) VAP Bundle SBT q am when appropriate CXR 10/6 Duonebs Diurese as able re: hypotension and renal function  CARDIOVASCULAR A:  Post PEA arrest/ Septic Shock Remains on low dose pressors Elevated Troponins EF 55-60% P: Telemetry monitoring  Titrate Levao as able  Maintain MAP > 65 BNP 12 Lead EKG 10/6 am Trend troponins  Echo as above    RENAL A:   Acute Kidney Injury Poor U/O Creatinine bump 10/5 overnight P:   Trend BMET Mag Phos Replete Electrolytes as needed Avoid Nephrotoxic drugs Maintain renal perfusion Strict I&O Consider renal consult   GASTROINTESTINAL A:   Protein Calorie malnutrition P:   VOH:[id number] Consider TF per Dietitian   HEMATOLOGIC A:  [place]  P:  Trend CBC Monitor  for active bleeding Transfuse for HGB <7 DVT prophylaxis:Heparin 5000 units SQ  Q 8   INFECTIOUS A: Suspected HAP  T Max 99.5   P:   Trend fever/ WBC Curve Trend PCT/Lactate as appropriate Cultures as above ABX as above Follow micro for results Narrow ABX therapy once sensitivities available  ENDOCRINE A:     No Acute Issues P:   CBG Q 4 SSI  NEUROLOGIC A:  AMS  Baseline dementia P:   RASS goal: 0 to -1 Neuro Checks per unit protocol Minimize sedation Consider EEG Frequent re-orientation  FAMILY  - Updates: No family at bedside  - Inter-disciplinary family meet or Palliative Care meeting due by:  10/10.   There needs to be discussion with family regarding code status and goals of care to further guide plan of care . Most likely patient is not a candidate for HD.     Magdalen Spatz, AGACNP-BC [facility] [place] Pager: [phone]  04/26/2017, 9:08 AM

## 2017-04-26 NOTE — Progress Notes (Signed)
[facility] Progress Note Patient Name: [name] [name] DOB: July 04, 1931 MRN: [id number]   Date of Service  04/26/2017  HPI/Events of Note  Resp and metabolic acidosis with pH 7.14/59/82/19  eICU Interventions  Plan: Increase RR on vent to 25 TV remains at 550cc Recheck ABG in 2 hours     Intervention Category Major Interventions: Acid-Base disturbance - evaluation and management  Malaia Buchta 04/26/2017, 5:34 AM

## 2017-04-26 NOTE — Progress Notes (Signed)
EEG complete - pending results

## 2017-04-26 NOTE — Progress Notes (Addendum)
Pharmacy Antibiotic Note  [name] [name] is a 83 y.o. male admitted on 05/06/2017 with sepsis.  Pharmacy has been consulted for Vancomycin and zosyn dosing.  Today, 04/26/2017:  SCr markedly worse today  Afebrile, WBC wnl  UOP low  Plan:  Hold vancomycin for today; based on SCr tomorrow will either check a random level or resume renally adjusted dosing  MRSA PCR negative which strongly supports stopping vancomycin for pulmonary source  Reduce Zosyn to 2.25 g IV q6 hr for CrCl < 20 ml/min  F/U cxs and clinical progress  Monitor V/S, labs, and levels as indicated  Height: 5\' 7"  (170.2 cm) Weight: 172 lb 3.2 oz (78.1 kg) IBW/kg (Calculated) : 66.1  Temp (24hrs), Avg:99.7 F (37.6 C), Min:97.5 F (36.4 C), Max:100 F (37.8 C)   Recent Labs Lab 05/19/2017 1220 04/29/2017 1253 05/05/2017 1510 04/23/17 0623 04/24/17 0536 04/24/17 1735 04/24/17 2007 04/24/17 2304 04/25/17 0409 04/26/17 0524  WBC 8.2  --   --  7.7  --  9.3  --   --  6.8 7.4  CREATININE 3.12*  --   --  2.29* 2.27* 2.40*  --   --  2.54* 4.25*  LATICACIDVEN  --  1.2 1.3  --   --  7.3* 2.0* 1.3  --   --     Estimated Creatinine Clearance: 12.3 mL/min (A) (by C-G formula based on SCr of 4.25 mg/dL (H)).    No Known Allergies  Antimicrobials this admission: Vancomycin 10/3 >>  Zosyn 10/3>>    Dose adjustments this admission: N/A   Microbiology results: 10/3 BCx: ngtd 10/3 UCx: sent 10/3 MRSA PCR: neg 10/3 trach aspirate: IP  Thank you for allowing pharmacy to be a part of this patient's care.  Reuel Boom, PharmD, BCPS Pager: [phone] 04/26/2017, 12:11 PM

## 2017-04-26 NOTE — Progress Notes (Addendum)
E-LInk MD made aware of patient's poor urine output- 90 ml in the last 8 hrs, CVP 14, bladder scan showed no urine, currently on 8 mcg of Levophed. No new order received. Patient will continue to be monitored thru this shift.

## 2017-04-27 ENCOUNTER — Inpatient Hospital Stay (HOSPITAL_COMMUNITY): Payer: Medicare HMO

## 2017-04-27 LAB — CBC
HCT: 24.2 % — ABNORMAL LOW (ref 39.0–52.0)
Hemoglobin: 8 g/dL — ABNORMAL LOW (ref 13.0–17.0)
MCH: 32.5 pg (ref 26.0–34.0)
MCHC: 33.1 g/dL (ref 30.0–36.0)
MCV: 98.4 fL (ref 78.0–100.0)
PLATELETS: 128 10*3/uL — AB (ref 150–400)
RBC: 2.46 MIL/uL — AB (ref 4.22–5.81)
RDW: 16.9 % — AB (ref 11.5–15.5)
WBC: 5.9 10*3/uL (ref 4.0–10.5)

## 2017-04-27 LAB — BASIC METABOLIC PANEL
Anion gap: 14 (ref 5–15)
BUN: 54 mg/dL — AB (ref 6–20)
CALCIUM: 8.4 mg/dL — AB (ref 8.9–10.3)
CHLORIDE: 112 mmol/L — AB (ref 101–111)
CO2: 19 mmol/L — ABNORMAL LOW (ref 22–32)
CREATININE: 5.65 mg/dL — AB (ref 0.61–1.24)
GFR, EST AFRICAN AMERICAN: 10 mL/min — AB (ref >60)
GFR, EST NON AFRICAN AMERICAN: 8 mL/min — AB (ref >60)
Glucose, Bld: 95 mg/dL (ref 65–99)
Potassium: 4.2 mmol/L (ref 3.5–5.1)
SODIUM: 145 mmol/L (ref 135–145)

## 2017-04-27 LAB — GLUCOSE, CAPILLARY
GLUCOSE-CAPILLARY: 102 mg/dL — AB (ref 65–99)
GLUCOSE-CAPILLARY: 91 mg/dL (ref 65–99)
GLUCOSE-CAPILLARY: 88 mg/dL (ref 65–99)
GLUCOSE-CAPILLARY: 88 mg/dL (ref 65–99)
GLUCOSE-CAPILLARY: 120 mg/dL — AB (ref 65–99)
GLUCOSE-CAPILLARY: 127 mg/dL — AB (ref 65–99)
Glucose-Capillary: 84 mg/dL (ref 65–99)

## 2017-04-27 LAB — TROPONIN I: TROPONIN I: 0.32 ng/mL — AB (ref <0.03)

## 2017-04-27 LAB — MAGNESIUM
MAGNESIUM: 2 mg/dL (ref 1.7–2.4)
MAGNESIUM: 2.3 mg/dL (ref 1.7–2.4)
Magnesium: 2.1 mg/dL (ref 1.7–2.4)

## 2017-04-27 LAB — PHOSPHORUS
PHOSPHORUS: 5.5 mg/dL — AB (ref 2.5–4.6)
Phosphorus: 5.6 mg/dL — ABNORMAL HIGH (ref 2.5–4.6)
Phosphorus: 6.6 mg/dL — ABNORMAL HIGH (ref 2.5–4.6)

## 2017-04-27 MED ORDER — VITAL HIGH PROTEIN PO LIQD
1000.0000 mL | ORAL | Status: DC
  Filled 2017-04-27: qty 1000

## 2017-04-27 MED ORDER — PANTOPRAZOLE SODIUM 40 MG PO PACK
40.0000 mg | PACK | ORAL | Status: DC
  Administered 2017-04-27 – 2017-04-30 (×4): 40 mg
  Filled 2017-04-27 (×4): qty 20

## 2017-04-27 MED ORDER — LACTATED RINGERS IV SOLN
INTRAVENOUS | Status: DC
  Administered 2017-04-27: 10:00:00 via INTRAVENOUS
  Administered 2017-04-28: 50 mL via INTRAVENOUS

## 2017-04-27 MED ORDER — MIDAZOLAM HCL 2 MG/2ML IJ SOLN
1.0000 mg | INTRAMUSCULAR | Status: DC | PRN
  Administered 2017-04-28 (×2): 1 mg via INTRAVENOUS
  Filled 2017-04-27 (×2): qty 2

## 2017-04-27 MED ORDER — VITAL AF 1.2 CAL PO LIQD
1000.0000 mL | ORAL | Status: DC
  Administered 2017-04-27 – 2017-04-29 (×3): 1000 mL
  Filled 2017-04-27 (×7): qty 1000

## 2017-04-27 MED ORDER — DOCUSATE SODIUM 50 MG/5ML PO LIQD: 100.0000 mg | Freq: Two times a day (BID) | ORAL | Status: DC | PRN

## 2017-04-27 MED ORDER — PRO-STAT SUGAR FREE PO LIQD: 30.0000 mL | Freq: Two times a day (BID) | ORAL | Status: DC

## 2017-04-27 MED ORDER — FENTANYL CITRATE (PF) 100 MCG/2ML IJ SOLN
50.0000 ug | INTRAMUSCULAR | Status: DC | PRN
  Administered 2017-04-27 – 2017-04-28 (×6): 50 ug via INTRAVENOUS
  Filled 2017-04-27 (×6): qty 2

## 2017-04-27 MED ORDER — HYDROCORTISONE NA SUCCINATE PF 100 MG IJ SOLR
50.0000 mg | Freq: Four times a day (QID) | INTRAMUSCULAR | Status: DC
  Administered 2017-04-27 – 2017-04-28 (×4): 50 mg via INTRAVENOUS
  Filled 2017-04-27 (×4): qty 2

## 2017-04-27 MED ORDER — ACETAMINOPHEN 160 MG/5ML PO SOLN
650.0000 mg | Freq: Four times a day (QID) | ORAL | Status: DC | PRN
  Administered 2017-04-29: 650 mg
  Filled 2017-04-27: qty 20.3

## 2017-04-27 MED ORDER — BISACODYL 10 MG RE SUPP: 10.0000 mg | Freq: Every day | RECTAL | Status: DC | PRN

## 2017-04-27 NOTE — Progress Notes (Signed)
Brief Nutrition Note  Consult received for enteral/tube feeding initiation and management.  Adult Enteral Nutrition Protocol initiated has been initiated.   Patient was seen yesterday with following TF recs placed.   Vital AF 1.2 @ goal rate of 60 ml/hr Provides: 1728 kcals, 108 grams protein, 1166 ml free water. This meets 103% of calorie needs and 100% of protein needs.   Will implement these recommendations.   Noted kidney function is worsening though K/phos are improved. Do not feel renal formula is indicated at this time. RD to follow up and adjust accordingly tomorrow   Admitting Dx: Dehydration [E86.0] Elevated troponin [R74.8] Generalized weakness [R53.1] Acute kidney injury ([place]) [N17.9]  Body mass index is 26.97 kg/m. Pt meets criteria for overweight based on current BMI.  Labs:  Recent Labs Lab 04/25/17 0409 04/26/17 0524 04/27/17 0523  NA 144 145 145  K 4.6 4.9 4.2  CL 110 113* 112*  CO2 26 21* 19*  BUN 39* 46* 54*  CREATININE 2.54* 4.25* 5.65*  CALCIUM 8.6* 8.3* 8.4*  MG 1.9 1.8 2.0  PHOS  --  6.3* 5.5*  GLUCOSE 113* 114* 95    Burtis Junes RD, LDN, CNSC Clinical Nutrition Pager: [phone] 04/27/2017 9:07 AM

## 2017-04-27 NOTE — Progress Notes (Signed)
PULMONARY / CRITICAL CARE MEDICINE   Name: [name] [name] MRN: [id number] DOB: [birth year]    ADMISSION DATE:  05/07/2017   CONSULTATION DATE:  04/25/2017  REFERRING MD:  Louellen Molder, M.D. / [facility]  CHIEF COMPLAINT:  Septic Shock  HISTORY OF PRESENT ILLNESS:   83 yo male with weakness, decreased appetite, dizzy, abdominal distention and fall at home.  Admitted with failure to thrive.  Developed progressive hypoxia and cough.  This lead to PEA cardiac arrest and VDRF. PMHx of COPD/emphysema, CAD, CKD 3, dementia.  SUBJECTIVE:  Remains on vent support.  VITAL SIGNS: BP 127/65   Pulse 86   Temp 98.6 F (37 C)   Resp (!) 25   Ht 5\' 7"  (1.702 m)   Wt 172 lb 2.9 oz (78.1 kg)   SpO2 100%   BMI 26.97 kg/m   HEMODYNAMICS: CVP:  [12 mmHg] 12 mmHg  VENTILATOR SETTINGS: Vent Mode: PRVC FiO2 (%):  [40 %] 40 % Set Rate:  [25 bmp] 25 bmp Vt Set:  [530 mL] 530 mL PEEP:  [5 cmH20] 5 cmH20 Plateau Pressure:  [20 cmH20-22 cmH20] 21 cmH20  INTAKE / OUTPUT: I/O last 3 completed shifts: In: 1474.6 [I.V.:949.6; IV Piggyback:525] Out: 560 [Urine:560]  PHYSICAL EXAMINATION:  General - on vent Eyes - pupils reactive ENT - ETT in place Cardiac - regular, no murmur Chest - no wheeze, rales Abd - soft, non tender Ext - no edema Skin - no rashes Neuro - opens eyes spontaneously, doesn't follow commands  LABS:  BMET  Recent Labs Lab 04/25/17 0409 04/26/17 0524 04/27/17 0523  NA 144 145 145  K 4.6 4.9 4.2  CL 110 113* 112*  CO2 26 21* 19*  BUN 39* 46* 54*  CREATININE 2.54* 4.25* 5.65*  GLUCOSE 113* 114* 95    Electrolytes  Recent Labs Lab 04/25/17 0409 04/26/17 0524 04/27/17 0523  CALCIUM 8.6* 8.3* 8.4*  MG 1.9 1.8 2.0  PHOS  --  6.3* 5.5*    CBC  Recent Labs Lab 04/25/17 0409 04/26/17 0524 04/27/17 0523  WBC 6.8 7.4 5.9  HGB 8.8* 9.0* 8.0*  HCT 27.2* 28.9* 24.2*  PLT 147* 138* 128*    Coag's  Recent Labs Lab 04/25/17 1051  INR 1.46     Sepsis Markers  Recent Labs Lab 04/24/17 1735 04/24/17 2007 04/24/17 2304  LATICACIDVEN 7.3* 2.0* 1.3    ABG  Recent Labs Lab 04/25/17 0040 04/26/17 0518 04/26/17 0810  PHART 7.317* 7.148* 7.280*  PCO2ART 44.3 58.7* 41.2  PO2ART 104.0 82.6* 66.3*    Liver Enzymes  Recent Labs Lab 04/23/17 0623 04/24/17 1735 04/25/17 0409  AST 53* 883* 644*  ALT 43 567* 540*  ALKPHOS 35* 44 37*  BILITOT 0.7 0.5 0.6  ALBUMIN 3.3* 3.4* 3.0*    Cardiac Enzymes  Recent Labs Lab 04/24/17 1900 04/25/17 0830 04/27/17 0523  TROPONINI 0.76* 0.71* 0.32*    Glucose  Recent Labs Lab 05/21/2017 2105 04/26/17 1204 04/26/17 1531 04/26/17 1946 04/26/17 2322 04/27/17 0325  GLUCAP 73 116* 116* 102* 91 84    Imaging Dg Chest Port 1 View  Result Date: 04/27/2017 CLINICAL DATA:  Respiratory failure. History of COPD, hypertension, coronary artery disease, former smoker. EXAM: PORTABLE CHEST 1 VIEW COMPARISON:  Chest x-rays dated 04/26/2017, 04/25/2017 and 11/20/2016. FINDINGS: Endotracheal tube is well positioned with tip just above the level of the carina. Enteric tube passes below the diaphragm. Right IJ central line appears appropriately positioned with tip at the level of  the mid/lower SVC. Heart size and mediastinal contours are stable. Chronic coarse lung markings bilaterally indicating chronic interstitial lung disease, probable superimposed atelectasis at each lung base. No significant change compared to yesterday's exam. IMPRESSION: No significant change compared to yesterday's exam. Probable mild bibasilar atelectasis superimposed on chronic interstitial lung disease. Support apparatus appears appropriately position. Electronically Signed   By: Franki Cabot M.D.   On: 04/27/2017 06:59     STUDIES:  PFT 10/27/14 >> FVC 2.84 L (84%) FEV1 1.86 L (73%) FEV1/FVC 0.66 FEF 25/1.11 L (43%) CT ABD/PELVIS W/O 10/1 >> emphysema and basilar fibrosis, b/l inguinal hernias. CT HEAD W/O  10/3 >> atrophy TTE 10/4 >> mild LVH, EF 55 to 60%, mild/mod RV enlargement EEG 10/05 >> moderate generalized low voltage  MICROBIOLOGY: Blood Cultures x2 10/3 >>> Urine Culture 10/3 >>> negative Sputum Culture 10/4>>  ANTIBIOTICS: Augmentin 10/2 - 10/3 Zosyn 10/3 >>> Vancomycin 10/3 >>>  SIGNIFICANT EVENTS: 10/01 - Admit to [facility] 10/03 - Pulseless arrest at outside hospital>>Found unresponsive at 4:30pm. Intubated by EDP. ROSC after 5-7 min of CPR & 1mg  Epi IV. 10/04 - Transfer to [facility]  LINES/TUBES: OETT 7.5 10/3 >>> R IJ CVL 10/4 >>>  ASSESSMENT / PLAN:  Acute on chronic hypoxic, hypercapnic respiratory failure from HCAP. Hx of advanced COPD with emphysema and pulmonary fibrosis. - full vent support - f/u CXR - scheduled BDs - day 4 of Abx, on zosyn  PEA cardiac arrest 2nd to hypoxic respiratory failure. Septic shock. NSTEMI 2nd to demand ischemia. - pressors to keep MAP > 65  Anoxic encephalopathy. Hx of dementia. - f/u CT head 10/06  Acute renal failure 2nd to ATN >> baseline creatinine 2.58 from 04/17/17. CKD 3. Lactic acidosis >> resolved. - optimize hemodynamics - monitor renal fx, urine outpt - not a candidate for HD given comorbid conditions (discussed with wife 10/06)  Relative adrenal insufficiency >> cortisol 14.8 from 04/26/17. - solu cortef started 10/06  Anemia, thrombocytopenia of critical illness and chronic disease. - f/u CBC  Moderate protein calorie malnutrition. - tube feeds  DVT prophylaxis - SQ heparin SUP - protonix Nutrition - tube feeds Goals of care - Full code  Updated pt's wife at bedside 10/06.  CC time 33 minutes  Chesley Mires, MD [place] 04/27/2017, 8:52 AM Pager:  [phone] After 3pm call: [phone]

## 2017-04-28 ENCOUNTER — Inpatient Hospital Stay (HOSPITAL_COMMUNITY): Payer: Medicare HMO

## 2017-04-28 LAB — BLOOD GAS, ARTERIAL
Acid-base deficit: 5.8 mmol/L — ABNORMAL HIGH (ref 0.0–2.0)
Bicarbonate: 19.4 mmol/L — ABNORMAL LOW (ref 20.0–28.0)
DRAWN BY: 514251
FIO2: 50
MECHVT: 430 mL
O2 Saturation: 99 %
PEEP/CPAP: 8 cmH2O
PO2 ART: 180 mmHg — AB (ref 83.0–108.0)
Patient temperature: 37
RATE: 25 resp/min
pCO2 arterial: 39.1 mmHg (ref 32.0–48.0)
pH, Arterial: 7.315 — ABNORMAL LOW (ref 7.350–7.450)

## 2017-04-28 LAB — CULTURE, RESPIRATORY

## 2017-04-28 LAB — COMPREHENSIVE METABOLIC PANEL
ALBUMIN: 2.3 g/dL — AB (ref 3.5–5.0)
ALT: 284 U/L — ABNORMAL HIGH (ref 17–63)
ANION GAP: 12 (ref 5–15)
AST: 110 U/L — ABNORMAL HIGH (ref 15–41)
Alkaline Phosphatase: 42 U/L (ref 38–126)
BUN: 65 mg/dL — ABNORMAL HIGH (ref 6–20)
CO2: 22 mmol/L (ref 22–32)
Calcium: 8.9 mg/dL (ref 8.9–10.3)
Chloride: 114 mmol/L — ABNORMAL HIGH (ref 101–111)
Creatinine, Ser: 6.44 mg/dL — ABNORMAL HIGH (ref 0.61–1.24)
GFR calc Af Amer: 8 mL/min — ABNORMAL LOW (ref >60)
GFR calc non Af Amer: 7 mL/min — ABNORMAL LOW (ref >60)
GLUCOSE: 178 mg/dL — AB (ref 65–99)
POTASSIUM: 4.4 mmol/L (ref 3.5–5.1)
SODIUM: 148 mmol/L — AB (ref 135–145)
Total Bilirubin: 0.6 mg/dL (ref 0.3–1.2)
Total Protein: 6.3 g/dL — ABNORMAL LOW (ref 6.5–8.1)

## 2017-04-28 LAB — CBC
HEMATOCRIT: 23.4 % — AB (ref 39.0–52.0)
HEMOGLOBIN: 8 g/dL — AB (ref 13.0–17.0)
MCH: 32.9 pg (ref 26.0–34.0)
MCHC: 34.2 g/dL (ref 30.0–36.0)
MCV: 96.3 fL (ref 78.0–100.0)
Platelets: 135 10*3/uL — ABNORMAL LOW (ref 150–400)
RBC: 2.43 MIL/uL — ABNORMAL LOW (ref 4.22–5.81)
RDW: 16.7 % — ABNORMAL HIGH (ref 11.5–15.5)
WBC: 5.2 10*3/uL (ref 4.0–10.5)

## 2017-04-28 LAB — GLUCOSE, CAPILLARY
GLUCOSE-CAPILLARY: 111 mg/dL — AB (ref 65–99)
GLUCOSE-CAPILLARY: 150 mg/dL — AB (ref 65–99)
GLUCOSE-CAPILLARY: 174 mg/dL — AB (ref 65–99)
Glucose-Capillary: 162 mg/dL — ABNORMAL HIGH (ref 65–99)
Glucose-Capillary: 146 mg/dL — ABNORMAL HIGH (ref 65–99)
Glucose-Capillary: 150 mg/dL — ABNORMAL HIGH (ref 65–99)

## 2017-04-28 LAB — MAGNESIUM: MAGNESIUM: 2.2 mg/dL (ref 1.7–2.4)

## 2017-04-28 LAB — PHOSPHORUS: Phosphorus: 5.5 mg/dL — ABNORMAL HIGH (ref 2.5–4.6)

## 2017-04-28 LAB — CULTURE, RESPIRATORY W GRAM STAIN

## 2017-04-28 MED ORDER — FINASTERIDE 5 MG PO TABS
5.0000 mg | ORAL_TABLET | Freq: Every morning | ORAL | Status: DC
  Administered 2017-04-28 – 2017-04-30 (×3): 5 mg via ORAL
  Filled 2017-04-28 (×3): qty 1

## 2017-04-28 MED ORDER — LABETALOL HCL 5 MG/ML IV SOLN
10.0000 mg | INTRAVENOUS | Status: DC | PRN
  Administered 2017-04-28 – 2017-04-29 (×3): 10 mg via INTRAVENOUS
  Filled 2017-04-28 (×2): qty 4

## 2017-04-28 MED ORDER — METOPROLOL TARTRATE 25 MG/10 ML ORAL SUSPENSION
50.0000 mg | Freq: Two times a day (BID) | ORAL | Status: DC
  Administered 2017-04-28 – 2017-04-30 (×5): 50 mg
  Filled 2017-04-28 (×6): qty 20

## 2017-04-28 MED ORDER — ROSUVASTATIN CALCIUM 20 MG PO TABS: 40.0000 mg | ORAL_TABLET | Freq: Every evening | ORAL | Status: DC

## 2017-04-28 MED ORDER — EZETIMIBE 10 MG PO TABS
10.0000 mg | ORAL_TABLET | Freq: Every day | ORAL | Status: DC
  Administered 2017-04-28 – 2017-04-30 (×3): 10 mg
  Filled 2017-04-28 (×3): qty 1

## 2017-04-28 MED ORDER — EZETIMIBE 10 MG PO TABS: 10.0000 mg | ORAL_TABLET | Freq: Every day | ORAL | Status: DC

## 2017-04-28 MED ORDER — ROSUVASTATIN CALCIUM 20 MG PO TABS
40.0000 mg | ORAL_TABLET | Freq: Every evening | ORAL | Status: DC
  Administered 2017-04-28 – 2017-04-29 (×2): 40 mg
  Filled 2017-04-28 (×2): qty 2

## 2017-04-28 MED ORDER — ASPIRIN 81 MG PO TABS: 81.0000 mg | ORAL_TABLET | Freq: Every day | ORAL | Status: DC

## 2017-04-28 MED ORDER — HYDROCORTISONE NA SUCCINATE PF 100 MG IJ SOLR
50.0000 mg | Freq: Two times a day (BID) | INTRAMUSCULAR | Status: DC
  Administered 2017-04-28 – 2017-04-29 (×2): 50 mg via INTRAVENOUS
  Filled 2017-04-28 (×2): qty 2

## 2017-04-28 MED ORDER — ASPIRIN 81 MG PO CHEW
81.0000 mg | CHEWABLE_TABLET | Freq: Every day | ORAL | Status: DC
  Administered 2017-04-28 – 2017-04-30 (×3): 81 mg
  Filled 2017-04-28 (×3): qty 1

## 2017-04-28 MED ORDER — TAMSULOSIN HCL 0.4 MG PO CAPS
0.8000 mg | ORAL_CAPSULE | Freq: Every day | ORAL | Status: DC
Start: 2017-04-28 — End: 2017-04-30
  Administered 2017-04-28 – 2017-04-29 (×2): 0.8 mg via ORAL
  Filled 2017-04-28 (×2): qty 2

## 2017-04-28 NOTE — Progress Notes (Signed)
RT bagged and lavaged the Pt due to low SATS and the SX catheter was hard to pass. RT got 2 plugs. Pt is stable. RT will continue to monitor

## 2017-04-28 NOTE — Progress Notes (Signed)
PULMONARY / CRITICAL CARE MEDICINE   Name: [name] [name] MRN: [id number] DOB: 1930-10-14    ADMISSION DATE:  05/03/2017   CONSULTATION DATE:  04/25/2017  REFERRING MD:  Louellen Molder, M.D. / [facility]  CHIEF COMPLAINT:  Septic Shock  HISTORY OF PRESENT ILLNESS:   83 yo male with weakness, decreased appetite, dizzy, abdominal distention and fall at home.  Admitted with failure to thrive.  Developed progressive hypoxia and cough.  This lead to PEA cardiac arrest and VDRF. PMHx of COPD/emphysema, CAD, CKD 3, dementia.  SUBJECTIVE:  Tolerating pressure support, and does better in this mode with vent synchrony.  Making urine still.  VITAL SIGNS: BP (!) 174/72   Pulse 88   Temp (!) 97.5 F (36.4 C)   Resp (!) 28   Ht 5\' 7"  (1.702 m)   Wt 172 lb 6.4 oz (78.2 kg)   SpO2 100%   BMI 27.00 kg/m   VENTILATOR SETTINGS: Vent Mode: PRVC FiO2 (%):  [30 %-60 %] 40 % Set Rate:  [25 bmp] 25 bmp Vt Set:  [430 mL-530 mL] 430 mL PEEP:  [5 cmH20-8 cmH20] 8 cmH20 Plateau Pressure:  [20 cmH20-22 cmH20] 20 cmH20  INTAKE / OUTPUT: I/O last 3 completed shifts: In: 2538.4 [I.V.:1141.4; NG/GT:1197; IV [id number]:734] Out: 1010 [Urine:810; Emesis/NG output:200]  PHYSICAL EXAMINATION:  General - on vent Eyes - pupils reactive ENT - ETT in place Cardiac - regular, no murmur Chest - scattered rhonchi Abd - soft, non tender Ext - no edema Skin - no rashes Neuro - opens eyes spontaneously, not following commands  LABS:  BMET  Recent Labs Lab 04/26/17 0524 04/27/17 0523 04/28/17 0348  NA 145 145 148*  K 4.9 4.2 4.4  CL 113* 112* 114*  CO2 21* 19* 22  BUN 46* 54* 65*  CREATININE 4.25* 5.65* 6.44*  GLUCOSE 114* 95 178*    Electrolytes  Recent Labs Lab 04/26/17 0524 04/27/17 0523 04/27/17 1002 04/27/17 1702 04/28/17 0348  CALCIUM 8.3* 8.4*  --   --  8.9  MG 1.8 2.0 2.1 2.3 2.2  PHOS 6.3* 5.5* 5.6* 6.6* 5.5*    CBC  Recent Labs Lab 04/26/17 0524 04/27/17 0523  04/28/17 0348  WBC 7.4 5.9 5.2  HGB 9.0* 8.0* 8.0*  HCT 28.9* 24.2* 23.4*  PLT 138* 128* 135*    Coag's  Recent Labs Lab 04/25/17 1051  INR 1.46    Sepsis Markers  Recent Labs Lab 04/24/17 1735 04/24/17 2007 04/24/17 2304  LATICACIDVEN 7.3* 2.0* 1.3    ABG  Recent Labs Lab 04/26/17 0518 04/26/17 0810 04/28/17 0358  PHART 7.148* 7.280* 7.315*  PCO2ART 58.7* 41.2 39.1  PO2ART 82.6* 66.3* 180*    Liver Enzymes  Recent Labs Lab 04/24/17 1735 04/25/17 0409 04/28/17 0348  AST 883* 644* 110*  ALT 567* 540* 284*  ALKPHOS 44 37* 42  BILITOT 0.5 0.6 0.6  ALBUMIN 3.4* 3.0* 2.3*    Cardiac Enzymes  Recent Labs Lab 04/24/17 1900 04/25/17 0830 04/27/17 0523  TROPONINI 0.76* 0.71* 0.32*    Glucose  Recent Labs Lab 04/27/17 1202 04/27/17 1557 04/27/17 2014 04/27/17 2333 04/28/17 0331 04/28/17 0737  GLUCAP 88 120* 127* 150* 162* 174*    Imaging Ct Head Wo Contrast  Result Date: 04/27/2017 CLINICAL DATA:  Recent cardiopulmonary arrest. Poor recovery of mental status. EXAM: CT HEAD WITHOUT CONTRAST TECHNIQUE: Contiguous axial images were obtained from the base of the skull through the vertex without intravenous contrast. COMPARISON:  04/24/2017 CT. FINDINGS: Brain:  Generalized brain atrophy. Moderate chronic small-vessel ischemic changes affecting the cerebral hemispheric white matter. No evidence of acute or subacute infarction, mass lesion, hemorrhage, hydrocephalus or extra-axial collection. Vascular: There is atherosclerotic calcification of the major vessels at the base of the brain. Skull: Negative Sinuses/Orbits: Clear/normal Other: None IMPRESSION: No acute finding by CT. Atrophy and moderate chronic small-vessel ischemic changes throughout the brain, similar to the previous study. Electronically Signed   By: Nelson Chimes M.D.   On: 04/27/2017 09:56   Dg Chest Port 1 View  Result Date: 04/28/2017 CLINICAL DATA:  Respiratory failure EXAM: PORTABLE  CHEST 1 VIEW COMPARISON:  04/27/2017 FINDINGS: Endotracheal tube with the tip 2.8 cm above the carina. Nasogastric tube coursing below the diaphragm. Right jugular central venous catheter with the tip projecting over the cavoatrial junction. Bilateral diffuse interstitial and alveolar airspace opacities. Trace right pleural effusion. No pneumothorax. Stable cardiomediastinal silhouette. Prior CABG. No acute osseous abnormality. IMPRESSION: 1. Support lines and tubing in satisfactory position as described above. 2. Mild pulmonary edema. Electronically Signed   By: Kathreen Devoid   On: 04/28/2017 07:08     STUDIES:  PFT 10/27/14 >> FVC 2.84 L (84%) FEV1 1.86 L (73%) FEV1/FVC 0.66 FEF 25/1.11 L (43%) CT abd/pelvis 10/1 >> emphysema and basilar fibrosis, b/l inguinal hernias. CT head 10/3 >> atrophy TTE 10/4 >> mild LVH, EF 55 to 60%, mild/mod RV enlargement EEG 10/05 >> moderate generalized low voltage CT head 10/6 >> atrophy and moderate chronic small vessel ischemic changes  MICROBIOLOGY: Blood Cultures x2 10/3 >>> Urine Culture 10/3 >>> negative Sputum Culture 10/4>> Yeast >>  ANTIBIOTICS: Augmentin 10/2 - 10/3 Zosyn 10/3 >>> Vancomycin 10/3 >>> 10/4  SIGNIFICANT EVENTS: 10/01 - Admit to [facility] 10/03 - Pulseless arrest at outside hospital>>Found unresponsive at 4:30pm. Intubated by EDP. ROSC after 5-7 min of CPR & 1mg  Epi IV. 10/04 - Transfer to [facility] 10/06 - off pressors  LINES/TUBES: ETT 10/3 >>> Rt IJ CVL 10/4 >>>  ASSESSMENT / PLAN:  Acute on chronic hypoxic, hypercapnic respiratory failure from HCAP. Hx of advanced COPD with emphysema and pulmonary fibrosis. - pressure support wean as able >> mental status precludes extubation trial - scheduled BDs - f/u CXR - day 5/7 of Abx, currently on zosyn  PEA cardiac arrest 2nd to hypoxic respiratory failure. Septic shock >> resolved. NSTEMI 2nd to demand ischemia. Hypertension, HLD. - ASA, crestor, zetia,  lopressor - prn labetalol for SBP > 160  Anoxic encephalopathy. Hx of dementia. - monitor mental status - defer neurology assessment for now  Acute renal failure 2nd to ATN >> baseline creatinine 2.58 from 04/17/17. CKD 3. Lactic acidosis >> resolved. - still making urine - f/u BMET - not HD candidate given comorbid conditions (d/w pt's wife 10/06) - defer nephrology assessment for now  Relative adrenal insufficiency >> cortisol 14.8 from 04/26/17. - wean off solu cortef as able now that he is off pressors  Anemia, thrombocytopenia of critical illness and chronic disease. - f/u CBC  Moderate protein calorie malnutrition. - tube feeds  DVT prophylaxis - SQ heparin SUP - protonix Nutrition - tube feeds Goals of care - Full code  No family at bedside  CC time 31 minutes  Chesley Mires, MD [place] 04/28/2017, 8:47 AM Pager:  [phone] After 3pm call: [phone]

## 2017-04-28 NOTE — Progress Notes (Signed)
Nutrition Follow-up  INTERVENTION:   -Continue Vital AF 1.2 @ goal rate of 60 ml/hr -Provides: 1728 kcals, 108 grams protein, 1166 ml free water. This meets 103% of calorie needs and 100% of protein needs.   RD will continue to monitor  NUTRITION DIAGNOSIS:   Inadequate oral intake related to inability to eat as evidenced by NPO status.  Ongoing.  GOAL:   Provide needs based on ASPEN/SCCM guidelines  Meeting.  MONITOR:   TF tolerance, Vent status, Weight trends, Labs, Diet advancement  ASSESSMENT:   Pt with PMH significant for COPD, CAD, CKD, and dementia. Presents this admission with generalized weakness resulting in a mechanical fall at home. Admitted for failure to thrive in adult. hypoxic, was found without a pulse the same day requiring 5-7 minutes of CPR.   Patient receiving Vital AF 1.2 @ 60 ml/hr and tolerating.  Phosphorus levels trending down, will continue to monitor levels. Per chart, pt is not a HD candidate given comorbidities.  Patient is currently intubated on ventilator support MV: 10.5 L/min Temp (24hrs), Avg:98.1 F (36.7 C), Min:96.6 F (35.9 C), Max:99 F (37.2 C)  Labs reviewed: CBGs: 174 Elevated Na, BUN Elevated Phos -trending down Mg WNL GFR: 8  10/6: -Patient was seen yesterday with following TF recs placed.  -Vital AF 1.2@ goal rate of 70ml/hr -Noted kidney function is worsening though K/phos are improved. Do not feel renal formula is indicated at this time.  10/5: -Pt was placed on dysphagia 2 diet 10/2 before intubation, and consumed 25% of one meal. MD notes pt experienced progressively declining appetite with abdominal distention prior to admission.  -Records indicate pt has maintained his weight of 160-165 lb since October 2017. Do not suspect any significant wt loss.  -Nutrition-Focused physical exam completed. Findings are mild fat depletion, mild to moderate muscle depletion, and moderate generalized edema. Fluid accumulation  may be masking losses.   Diet Order:  Diet NPO time specified  Skin:   (stage I sacrum)  Last BM:  04/25/17  Height:   Ht Readings from Last 1 Encounters:  05/16/2017 5\' 7"  (1.702 m)    Weight:   Wt Readings from Last 1 Encounters:  04/28/17 172 lb 6.4 oz (78.2 kg)    Ideal Body Weight:  67.3 kg  BMI:  Body mass index is 27 kg/m.  Estimated Nutritional Needs:   Kcal:  9937  Protein:  115-120g  Fluid:  1.7L/day  EDUCATION NEEDS:   No education needs identified at this time  Clayton Bibles, MS, RD, LDN Pager: [phone] After Hours Pager: [phone]

## 2017-04-28 NOTE — Progress Notes (Signed)
Pt was placed on wean 5/5 by Dr. Halford Chessman

## 2017-04-28 NOTE — Progress Notes (Signed)
[facility] notified of rising blood sugar levels, Gretchen RN to notify MD to see if SSI is indicatied

## 2017-04-28 NOTE — Progress Notes (Signed)
RT changed the Ptr's ET Tube holder

## 2017-04-29 ENCOUNTER — Inpatient Hospital Stay (HOSPITAL_COMMUNITY): Payer: Medicare HMO

## 2017-04-29 LAB — GLUCOSE, CAPILLARY
GLUCOSE-CAPILLARY: 120 mg/dL — AB (ref 65–99)
GLUCOSE-CAPILLARY: 134 mg/dL — AB (ref 65–99)
GLUCOSE-CAPILLARY: 146 mg/dL — AB (ref 65–99)
Glucose-Capillary: 124 mg/dL — ABNORMAL HIGH (ref 65–99)
Glucose-Capillary: 168 mg/dL — ABNORMAL HIGH (ref 65–99)
Glucose-Capillary: 142 mg/dL — ABNORMAL HIGH (ref 65–99)
Glucose-Capillary: 201 mg/dL — ABNORMAL HIGH (ref 65–99)

## 2017-04-29 LAB — BASIC METABOLIC PANEL
ANION GAP: 14 (ref 5–15)
BUN: 80 mg/dL — ABNORMAL HIGH (ref 6–20)
CALCIUM: 9 mg/dL (ref 8.9–10.3)
CHLORIDE: 110 mmol/L (ref 101–111)
CO2: 23 mmol/L (ref 22–32)
Creatinine, Ser: 6.69 mg/dL — ABNORMAL HIGH (ref 0.61–1.24)
GFR calc non Af Amer: 7 mL/min — ABNORMAL LOW (ref >60)
GFR, EST AFRICAN AMERICAN: 8 mL/min — AB (ref >60)
Glucose, Bld: 142 mg/dL — ABNORMAL HIGH (ref 65–99)
Potassium: 4.5 mmol/L (ref 3.5–5.1)
Sodium: 147 mmol/L — ABNORMAL HIGH (ref 135–145)

## 2017-04-29 LAB — CBC
HCT: 24.6 % — ABNORMAL LOW (ref 39.0–52.0)
HEMOGLOBIN: 8.3 g/dL — AB (ref 13.0–17.0)
MCH: 32.3 pg (ref 26.0–34.0)
MCHC: 33.7 g/dL (ref 30.0–36.0)
MCV: 95.7 fL (ref 78.0–100.0)
Platelets: 161 10*3/uL (ref 150–400)
RBC: 2.57 MIL/uL — AB (ref 4.22–5.81)
RDW: 16.8 % — ABNORMAL HIGH (ref 11.5–15.5)
WBC: 8.7 10*3/uL (ref 4.0–10.5)

## 2017-04-29 LAB — CULTURE, BLOOD (ROUTINE X 2)
CULTURE: NO GROWTH
Culture: NO GROWTH
SPECIAL REQUESTS: ADEQUATE

## 2017-04-29 LAB — PHOSPHORUS: Phosphorus: 5 mg/dL — ABNORMAL HIGH (ref 2.5–4.6)

## 2017-04-29 LAB — MAGNESIUM: Magnesium: 2.3 mg/dL (ref 1.7–2.4)

## 2017-04-29 MED ORDER — FREE WATER
200.0000 mL | Status: DC
  Administered 2017-04-29 – 2017-04-30 (×7): 200 mL

## 2017-04-29 MED ORDER — FENTANYL CITRATE (PF) 100 MCG/2ML IJ SOLN
12.5000 ug | INTRAMUSCULAR | Status: DC | PRN
  Administered 2017-04-29: 12.5 ug via INTRAVENOUS
  Filled 2017-04-29: qty 2

## 2017-04-29 MED ORDER — FENTANYL CITRATE (PF) 100 MCG/2ML IJ SOLN
25.0000 ug | INTRAMUSCULAR | Status: DC | PRN
  Administered 2017-04-29 – 2017-04-30 (×6): 25 ug via INTRAVENOUS
  Filled 2017-04-29 (×6): qty 2

## 2017-04-29 MED ORDER — MIDAZOLAM HCL 2 MG/2ML IJ SOLN
1.0000 mg | INTRAMUSCULAR | Status: DC | PRN
  Administered 2017-04-30 (×3): 1 mg via INTRAVENOUS
  Filled 2017-04-29 (×3): qty 2

## 2017-04-29 MED ORDER — HYDRALAZINE HCL 20 MG/ML IJ SOLN
10.0000 mg | INTRAMUSCULAR | Status: DC | PRN
  Administered 2017-04-29 (×2): 10 mg via INTRAVENOUS
  Filled 2017-04-29 (×2): qty 1

## 2017-04-29 MED ORDER — FUROSEMIDE 10 MG/ML IJ SOLN
80.0000 mg | Freq: Two times a day (BID) | INTRAMUSCULAR | Status: AC
  Administered 2017-04-29 (×2): 80 mg via INTRAVENOUS
  Filled 2017-04-29 (×2): qty 8

## 2017-04-29 NOTE — Progress Notes (Signed)
Spoke with pt son "Lucianne Muss" who informed me that he would like to changed his fathers code status to a DNR.  This RN informed the NP on call to make talk with the family.  Irven Baltimore, RN

## 2017-04-29 NOTE — Progress Notes (Signed)
[facility] Progress Note Patient Name: [name] [name] DOB: 1930/11/12 MRN: [id number]   Date of Service  04/29/2017  HPI/Events of Note  Hypertension - BP = 186/81.   eICU Interventions  Will order: 1. Hydralazine 10 mg IV Q 6 hours PRN SBP > 170 or DBP > 100.     Intervention Category Major Interventions: Hypertension - evaluation and management  Sommer,Steven Eugene 04/29/2017, 1:17 AM

## 2017-04-29 NOTE — Progress Notes (Signed)
Spoke w/ pt's wife. Also see Nurse's note re: discussion w/ patient's son.  Prognosis poor.  Major barriers: Worsening renal failure Dysphagia and aspiration risk Deconditioning  Poor cough mechanics.   We discussed that he has been failing at home for some time. They do not want dialysis or trach.   Plan DNR Cont supportive care.  eval over next 48hrs. If extubated will be one-way extubation and at that time be full DNR and DNI.  Erick Colace ACNP-BC [place] Pager # [phone] OR # [phone] if no answer

## 2017-04-29 NOTE — Care Management Note (Signed)
Case Management Note  Patient Details  Name: [name] [name] MRN: [id number] Date of Birth: 1930-09-30  Subjective/Objective:                  Vent and hypertendsive crisis  Action/Plan: Date:  April 29, 2017 Chart reviewed for concurrent status and case management needs.  Will continue to follow patient progress.  Discharge Planning: following for needs  Expected discharge date: [id number]  Velva Harman, BSN, [place], [place]   Expected Discharge Date:  04/23/17               Expected Discharge Plan:  Home/Self Care  In-House Referral:     Discharge planning Services  CM Consult  Post Acute Care Choice:    Choice offered to:     DME Arranged:    DME Agency:     HH Arranged:    HH Agency:     Status of Service:  In process, will continue to follow  If discussed at Long Length of Stay Meetings, dates discussed:    Additional Comments:  Leeroy Cha, RN 04/29/2017, 8:50 AM

## 2017-04-29 NOTE — Progress Notes (Addendum)
PULMONARY / CRITICAL CARE MEDICINE   Name: [name] [name] MRN: [id number] DOB: 1930-10-08    ADMISSION DATE:  05/02/2017   CONSULTATION DATE:  04/25/2017  REFERRING MD:  Louellen Molder, M.D. / [facility]  CHIEF COMPLAINT:  Septic Shock  HISTORY OF PRESENT ILLNESS:   83 yo male with weakness, decreased appetite, dizzy, abdominal distention and fall at home.  Admitted with failure to thrive.  Developed progressive hypoxia and cough.  This lead to PEA cardiac arrest and VDRF. PMHx of COPD/emphysema, CAD, CKD 3, dementia.  SUBJECTIVE:  He appears comfortable on pressure support ventilation of 5 cm water, no distress,received 2 doses of fentanyl overnight. Per nursing staff more awake then compared with 10/7, however still does not follow commands  VITAL SIGNS: BP (!) 164/80   Pulse 77   Temp 98.8 F (37.1 C)   Resp (!) 28   Ht 5\' 7"  (1.702 m)   Wt 177 lb 14.6 oz (80.7 kg)   SpO2 100%   BMI 27.86 kg/m   VENTILATOR SETTINGS: Vent Mode: PRVC FiO2 (%):  [40 %-50 %] 40 % Set Rate:  [25 bmp] 25 bmp Vt Set:  [530 mL] 530 mL PEEP:  [5 cmH20-8 cmH20] 5 cmH20 Pressure Support:  [5 cmH20] 5 cmH20 Plateau Pressure:  [22 cmH20-32 cmH20] 22 cmH20  INTAKE / OUTPUT:  Intake/Output Summary (Last 24 hours) at 04/29/17 0947 Last data filed at 04/29/17 0600  Gross per 24 hour  Intake          1428.17 ml  Output              670 ml  Net           758.17 ml    PHYSICAL EXAMINATION:  General - elderly 83 year old male patient currently appears comfortable on pressure support ventilation Eyes - Pupils are equal and reactive, sclera are nonicteric ENT - orally intubated, no JVD Cardiac -regular rate and rhythm sinus rhythm on telemetry Chest - scattered rhonchi, equal chest rise, tidal volume in the 7-800 mL range on pressure support of 5 cm water Abd - slightly distended, tympanic percussion, hyperactive bowel sounds. Ext -generalized anasarca, warm, risk Refill Skin -  Warm and dry Neuro -  awake, generalized weakness, moves all extremities but does not follow commands or track   LABS:  BMET  Recent Labs Lab 04/27/17 0523 04/28/17 0348 04/29/17 0500  NA 145 148* 147*  K 4.2 4.4 4.5  CL 112* 114* 110  CO2 19* 22 23  BUN 54* 65* 80*  CREATININE 5.65* 6.44* 6.69*  GLUCOSE 95 178* 142*    Electrolytes  Recent Labs Lab 04/27/17 0523  04/27/17 1702 04/28/17 0348 04/28/17 1720 04/29/17 0500  CALCIUM 8.4*  --   --  8.9  --  9.0  MG 2.0  < > 2.3 2.2 2.3  --   PHOS 5.5*  < > 6.6* 5.5* 5.0*  --   < > = values in this interval not displayed.  CBC  Recent Labs Lab 04/27/17 0523 04/28/17 0348 04/29/17 0500  WBC 5.9 5.2 8.7  HGB 8.0* 8.0* 8.3*  HCT 24.2* 23.4* 24.6*  PLT 128* 135* 161    Coag's  Recent Labs Lab 04/25/17 1051  INR 1.46    Sepsis Markers  Recent Labs Lab 04/24/17 1735 04/24/17 2007 04/24/17 2304  LATICACIDVEN 7.3* 2.0* 1.3    ABG  Recent Labs Lab 04/26/17 0518 04/26/17 0810 04/28/17 0358  PHART 7.148* 7.280* 7.315*  PCO2ART 58.7*  41.2 39.1  PO2ART 82.6* 66.3* 180*    Liver Enzymes  Recent Labs Lab 04/24/17 1735 04/25/17 0409 04/28/17 0348  AST 883* 644* 110*  ALT 567* 540* 284*  ALKPHOS 44 37* 42  BILITOT 0.5 0.6 0.6  ALBUMIN 3.4* 3.0* 2.3*    Cardiac Enzymes  Recent Labs Lab 04/24/17 1900 04/25/17 0830 04/27/17 0523  TROPONINI 0.76* 0.71* 0.32*    Glucose  Recent Labs Lab 04/28/17 0737 04/28/17 1126 04/28/17 1542 04/28/17 2329 04/29/17 0338 04/29/17 0734  GLUCAP 174* 146* 150* 111* 120* 134*    Imaging Dg Chest Port 1 View  Result Date: 04/29/2017 CLINICAL DATA:  Respiratory failure history of COPD, former smoker, coronary artery disease with CABG. EXAM: PORTABLE CHEST 1 VIEW COMPARISON:  Portable chest x-ray of April 28, 2017 FINDINGS: The lungs are adequately inflated. The interstitial markings remain increased. There is no pneumothorax nor large pleural effusion. The cardiac  silhouette is normal in size. The pulmonary vascularity is mildly prominent. The endotracheal tube tip lies approximately 3.5 cm above the carina. The right internal jugular venous catheter tip projects over the midportion of the SVC. The esophagogastric tube appears to have been withdrawn and is not visible below the neck. IMPRESSION: Chronic interstitial prominence, stable. One cannot exclude superimposed mild pulmonary interstitial edema. No alveolar pneumonia. The support tubes are in reasonable position. The orogastric tube appears to been withdrawn with none of the tube being visible along the course of the thoracic esophagus. Electronically Signed   By: David  Martinique M.D.   On: 04/29/2017 07:11     STUDIES:  PFT 10/27/14 >> FVC 2.84 L (84%) FEV1 1.86 L (73%) FEV1/FVC 0.66 FEF 25/1.11 L (43%) CT abd/pelvis 10/1 >> emphysema and basilar fibrosis, b/l inguinal hernias. CT head 10/3 >> atrophy TTE 10/4 >> mild LVH, EF 55 to 60%, mild/mod RV enlargement EEG 10/05 >> moderate generalized low voltage CT head 10/6 >> atrophy and moderate chronic small vessel ischemic changes  MICROBIOLOGY: Blood Cultures x2 10/3 >>>negative Urine Culture 10/3 >>> negative Sputum Culture 10/4>> Yeast >>few Candida albicans  ANTIBIOTICS: Augmentin 10/2 - 10/3 Zosyn 10/3 >>> Vancomycin 10/3 >>> 10/4  SIGNIFICANT EVENTS: 10/01 - Admit to [facility] 10/03 - Pulseless arrest at outside hospital>>Found unresponsive at 4:30pm. Intubated by EDP. ROSC after 5-7 min of CPR & 1mg  Epi IV. 10/04 - Transfer to [facility] 10/06 - off pressors  LINES/TUBES: ETT 10/3 >>> Rt IJ CVL 10/4 >>>  ASSESSMENT / PLAN:  Acute on chronic hypoxic, hypercapnic respiratory failure from HCAP. Hx of advanced COPD with emphysema and pulmonary fibrosis. P CXR personally reviewed: Endotracheal tube in satisfactory position rotated film today. Chronic interstitial changes noted, appears as though there has been some improvement  in aeration notably on the left His major barrier at this point is his mental status. I worry with his degree of somnolence that he have adequate airway protection if extubated although his ventilator mechanics would certainly support spontaneous ventilation. Plan Day 6 of 7 Zosyn PSV as tolerated, adjusting PAD protocol for RASS goal of 0 Add Lasix with a goal negative volume status Scheduled bronchodilators  PEA cardiac arrest 2nd to hypoxic respiratory failure. Septic shock >> resolved. NSTEMI 2nd to demand ischemia. Hypertension, HLD. Plan Continuing aspirin, Crestor, sodium and Lopressor When necessary labetalol for systolic blood pressure greater than 160 Lasix 1  Anoxic encephalopathy. Hx of dementia. Plan Supportive care We will decrease sedating medications, with rising creatinine suspect any sedation goes a long  way.  Acute renal failure 2nd to ATN >> baseline creatinine 2.58 from 04/17/17. CKD 3. Lactic acidosis >> resolved. Hypernatremia His creatinine continues to climb, his blood pressure is poorly controlled. He is not a dialysis candidate, this was discussed with the patient's spouse on 10/6. Plan Adding Lasix Continue blood pressure management Strict I&O Free water replacement Follow-up a.m. Chemistry Continue to dose medications renally  Relative adrenal insufficiency >> cortisol 14.8 from 04/26/17. Plan Continue to wean Solu-Medrol to off  Anemia, thrombocytopenia of critical illness and chronic disease. Plan Trend CBC Continuing subcutaneous heparin  Moderate protein calorie malnutrition. Plan Continue tube feeds  DVT prophylaxis - SQ heparin SUP - protonix Nutrition - tube feeds Goals of care - Full code  No family at bedside  Discussion: From a pulmonary standpoint Mr. Mori is improving, his ventilator mechanics suggest he could be extubated, however his mental status remains diminished, and I am concerned about adequate airway  protection. Furthermore his creatinine continues to rise, he is not a dialysis candidate,and if this continues this trend his prognosis will be quite poor. For today the plan will be as follows: Decrease sedation, Lasix, replace free water, and continue pressure support ventilation We'll meet with the patient's spouse later today, we need to discuss prognosis particularly if renal function continues to worsen.  My critical care time: 32 minutes.  Erick Colace ACNP-BC [place] Pager # [phone] OR # [phone] if no answer

## 2017-04-29 NOTE — Progress Notes (Signed)
Pharmacy Antibiotic Note  [name] [name] is a 83 y.o. male admitted on 05/02/2017 with sepsis.  Patient's currently on zosyn day #6.  CCM's note indicates plan is to treat for 7 days.  Today, 04/29/2017: - afeb, wbc wnl -  scr trending up 6.69 (crcl<10), UOP 0.3; not HD candidate  Plan:  Continue Zosyn to 2.25 g IV q6 hr for CrCl < 20 ml/min  F/u renal function  ____________________________________  Height: 5\' 7"  (170.2 cm) Weight: 177 lb 14.6 oz (80.7 kg) IBW/kg (Calculated) : 66.1  Temp (24hrs), Avg:98.6 F (37 C), Min:98.1 F (36.7 C), Max:99.1 F (37.3 C)   Recent Labs Lab 05/06/2017 1253 05/10/2017 1510  04/24/17 1735 04/24/17 2007 04/24/17 2304 04/25/17 0409 04/26/17 0524 04/27/17 0523 04/28/17 0348 04/29/17 0500  WBC  --   --   < > 9.3  --   --  6.8 7.4 5.9 5.2 8.7  CREATININE  --   --   < > 2.40*  --   --  2.54* 4.25* 5.65* 6.44* 6.69*  LATICACIDVEN 1.2 1.3  --  7.3* 2.0* 1.3  --   --   --   --   --   < > = values in this interval not displayed.  Estimated Creatinine Clearance: 8.5 mL/min (A) (by C-G formula based on SCr of 6.69 mg/dL (H)).    No Known Allergies  Antimicrobials this admission:  10/3 Vancomycin>> 10/5 10/3 Zosyn >>    Dose adjustments this admission: 10/5: reduce Zosyn to 2.25 q6   Microbiology results: 10/3 BCx x2: neg FINAL 10/3 UCx: NGF 10/3 MRSA PCR: neg 10/3 trach aspirate: few C albicans FINAL Thank you for allowing pharmacy to be a part of this patient's care.  Dia Sitter, PharmD, BCPS 04/29/2017 9:56 AM

## 2017-04-29 NOTE — Progress Notes (Signed)
[facility] Progress Note Patient Name: [name] [name] DOB: 1930-09-15 MRN: [id number]   Date of Service  04/29/2017  HPI/Events of Note  Agitation and ventilator dys-synchrony.  eICU Interventions  Will order: 1. Increase Fentanyl IV to 50 mcg Q 2 hours PRN.      Intervention Category Major Interventions: Other:  Lysle Dingwall 04/29/2017, 7:29 PM

## 2017-04-30 ENCOUNTER — Inpatient Hospital Stay (HOSPITAL_COMMUNITY): Payer: Medicare HMO

## 2017-04-30 DIAGNOSIS — N170 Acute kidney failure with tubular necrosis: Principal | ICD-10-CM

## 2017-04-30 DIAGNOSIS — N184 Chronic kidney disease, stage 4 (severe): Secondary | ICD-10-CM

## 2017-04-30 LAB — BLOOD GAS, ARTERIAL
Acid-base deficit: 2.9 mmol/L — ABNORMAL HIGH (ref 0.0–2.0)
Bicarbonate: 21.9 mmol/L (ref 20.0–28.0)
DRAWN BY: 232811
FIO2: 60
MECHVT: 530 mL
O2 Saturation: 89.4 %
PEEP: 8 cmH2O
Patient temperature: 99
RATE: 25 resp/min
pCO2 arterial: 41.7 mmHg (ref 32.0–48.0)
pH, Arterial: 7.342 — ABNORMAL LOW (ref 7.350–7.450)
pO2, Arterial: 66.7 mmHg — ABNORMAL LOW (ref 83.0–108.0)

## 2017-04-30 LAB — CBC
HCT: 21.6 % — ABNORMAL LOW (ref 39.0–52.0)
HEMOGLOBIN: 7.4 g/dL — AB (ref 13.0–17.0)
MCH: 32.7 pg (ref 26.0–34.0)
MCHC: 34.3 g/dL (ref 30.0–36.0)
MCV: 95.6 fL (ref 78.0–100.0)
PLATELETS: 157 10*3/uL (ref 150–400)
RBC: 2.26 MIL/uL — AB (ref 4.22–5.81)
RDW: 17.3 % — ABNORMAL HIGH (ref 11.5–15.5)
WBC: 9.8 10*3/uL (ref 4.0–10.5)

## 2017-04-30 LAB — GLUCOSE, CAPILLARY
GLUCOSE-CAPILLARY: 146 mg/dL — AB (ref 65–99)
Glucose-Capillary: 137 mg/dL — ABNORMAL HIGH (ref 65–99)

## 2017-04-30 LAB — COMPREHENSIVE METABOLIC PANEL
ALK PHOS: 35 U/L — AB (ref 38–126)
ALT: 154 U/L — AB (ref 17–63)
ANION GAP: 13 (ref 5–15)
AST: 52 U/L — ABNORMAL HIGH (ref 15–41)
Albumin: 2.2 g/dL — ABNORMAL LOW (ref 3.5–5.0)
BILIRUBIN TOTAL: 0.6 mg/dL (ref 0.3–1.2)
BUN: 99 mg/dL — ABNORMAL HIGH (ref 6–20)
CALCIUM: 8.8 mg/dL — AB (ref 8.9–10.3)
CO2: 23 mmol/L (ref 22–32)
CREATININE: 7.12 mg/dL — AB (ref 0.61–1.24)
Chloride: 112 mmol/L — ABNORMAL HIGH (ref 101–111)
GFR calc non Af Amer: 6 mL/min — ABNORMAL LOW (ref >60)
GFR, EST AFRICAN AMERICAN: 7 mL/min — AB (ref >60)
GLUCOSE: 145 mg/dL — AB (ref 65–99)
Potassium: 4.2 mmol/L (ref 3.5–5.1)
Sodium: 148 mmol/L — ABNORMAL HIGH (ref 135–145)
TOTAL PROTEIN: 6 g/dL — AB (ref 6.5–8.1)

## 2017-04-30 LAB — PHOSPHORUS: Phosphorus: 4.7 mg/dL — ABNORMAL HIGH (ref 2.5–4.6)

## 2017-04-30 LAB — MAGNESIUM: Magnesium: 2 mg/dL (ref 1.7–2.4)

## 2017-04-30 MED ORDER — NITROGLYCERIN IN D5W 200-5 MCG/ML-% IV SOLN
0.0000 ug/min | INTRAVENOUS | Status: DC
  Administered 2017-04-30: 10 ug/min via INTRAVENOUS
  Administered 2017-04-30: 120 ug/min via INTRAVENOUS
  Filled 2017-04-30 (×2): qty 250

## 2017-04-30 MED ORDER — FUROSEMIDE 10 MG/ML IJ SOLN
160.0000 mg | Freq: Three times a day (TID) | INTRAVENOUS | Status: DC
  Administered 2017-04-30: 160 mg via INTRAVENOUS
  Filled 2017-04-30 (×2): qty 16
  Filled 2017-04-30: qty 10

## 2017-04-30 MED ORDER — MORPHINE SULFATE (PF) 4 MG/ML IV SOLN
5.0000 mg | INTRAVENOUS | Status: DC | PRN
  Administered 2017-04-30 (×3): 5 mg via INTRAVENOUS
  Filled 2017-04-30 (×3): qty 2

## 2017-04-30 MED ORDER — IPRATROPIUM-ALBUTEROL 0.5-2.5 (3) MG/3ML IN SOLN
3.0000 mL | Freq: Four times a day (QID) | RESPIRATORY_TRACT | Status: DC
  Administered 2017-04-30: 3 mL via RESPIRATORY_TRACT
  Filled 2017-04-30: qty 3

## 2017-04-30 MED ORDER — FUROSEMIDE 10 MG/ML IJ SOLN
160.0000 mg | Freq: Once | INTRAVENOUS | Status: AC
  Administered 2017-04-30: 160 mg via INTRAVENOUS
  Filled 2017-04-30: qty 16

## 2017-04-30 MED ORDER — MIDAZOLAM HCL 2 MG/2ML IJ SOLN
INTRAMUSCULAR | Status: AC
  Filled 2017-04-30: qty 2

## 2017-04-30 MED ORDER — DEXTROSE 5 % IV SOLN
INTRAVENOUS | Status: DC
  Administered 2017-04-30: 10:00:00 via INTRAVENOUS

## 2017-04-30 MED ORDER — SODIUM CHLORIDE 0.9 % IV SOLN
10.0000 mg/h | INTRAVENOUS | Status: DC
  Administered 2017-04-30: 10 mg/h via INTRAVENOUS
  Filled 2017-04-30: qty 10

## 2017-04-30 MED ORDER — ALBUTEROL SULFATE (2.5 MG/3ML) 0.083% IN NEBU: 2.5000 mg | INHALATION_SOLUTION | RESPIRATORY_TRACT | Status: DC | PRN

## 2017-04-30 MED ORDER — IPRATROPIUM-ALBUTEROL 0.5-2.5 (3) MG/3ML IN SOLN
3.0000 mL | RESPIRATORY_TRACT | Status: DC
  Administered 2017-04-30 (×2): 3 mL via RESPIRATORY_TRACT
  Filled 2017-04-30 (×2): qty 3

## 2017-05-01 ENCOUNTER — Telehealth: Payer: Self-pay

## 2017-05-01 NOTE — Telephone Encounter (Signed)
On 05/01/17 I received a d/c from [employer] (original). The d/c is for burial. The patient is a patient of Doctor Ramaswamy. The d/c will be taken to Pulmonary Unit @ [facility] this pm for signature.  On 05/01/17 I received the d/c back from [place]. I got the d/c ready and called the funeral home to let them know the d/c is ready for pickup.

## 2017-05-04 DIAGNOSIS — R0600 Dyspnea, unspecified: Secondary | ICD-10-CM | POA: Diagnosis not present

## 2017-05-04 DIAGNOSIS — I10 Essential (primary) hypertension: Secondary | ICD-10-CM | POA: Diagnosis not present

## 2017-05-04 DIAGNOSIS — I739 Peripheral vascular disease, unspecified: Secondary | ICD-10-CM | POA: Diagnosis not present

## 2017-05-04 DIAGNOSIS — J441 Chronic obstructive pulmonary disease with (acute) exacerbation: Secondary | ICD-10-CM | POA: Diagnosis not present

## 2017-05-23 NOTE — Discharge Summary (Signed)
DISCHARGE SUMMARY    Date of admit: 05/02/2017 11:35 AM Date of discharge: 05-13-17  6:20 PM Length of Stay: 6 days  PCP is Antony Contras, MD   PROBLEM LIST Active Problems:   Chronic respiratory failure with hypoxia ([place])   COPD GOLD II   Fall   Pressure injury of skin   Acute kidney injury ([place])   CKD (chronic kidney disease), stage IV (HCC)   FTT (failure to thrive) in adult   Demand ischemia ([place])   Acute respiratory failure with hypoxemia ([place])   Acute renal failure with acute tubular necrosis superimposed on stage 4 chronic kidney disease (HCC)   Acute on chronic respiratory failure with hypoxia (HCC)   Hypotension   Septic shock ([place])    SUMMARY [name] [name] was 83 y.o. patient with    has a past medical history of Alzheimer disease; Anemia; BPH (benign prostatic hyperplasia); CAD (coronary artery disease); Carotid artery stenosis; Chronic chest wall pain; Chronic kidney disease (CKD), stage III (moderate) (HCC); COPD (chronic obstructive pulmonary disease) ([place]); Cough; DEMENTIA; Depression with anxiety; GERD (gastroesophageal reflux disease); Glaucoma; Hyperlipemia; Hypertension; Memory loss; Monoclonal gammopathy; Neuromuscular disorder ([place]); On home oxygen therapy; Peptic ulcer disease; Prostatitis; PVD (peripheral vascular disease) ([place]); and Retinal artery occlusion.   has a past surgical history that includes Left CEA (2002); Cataract extraction w/PHACO (09/18/2011); Cardiac surgery; Renal artery stent; Coronary artery bypass graft (1997); Back surgery (2011); Esophagogastroduodenoscopy (egd) with propofol (N/A, 07/11/2015); and Colonoscopy with propofol (N/A, 07/11/2015).   Admitted on 05/22/2017 with  83 yo male with weakness, decreased appetite, dizzy, abdominal distention and fall at home.  Admitted with failure to thrive.  Developed progressive hypoxia and cough.  This lead to PEA cardiac arrest and VDRF.     SIGNIFICANT EVENTS: 10/01 - Admit to  [facility] 10/03 - Pulseless arrest at outside hospital>>Found unresponsive at 4:30pm. Intubated by EDP. ROSC after 5-7 min of CPR & 1mg  Epi IV. ETT 10/3 >>> Rt IJ CVL 10/4 >>> 10/04 - Transfer to [facility] 10/06 - off pressors 10/8: Discussed goals of care with family, made DO NOT RESUSCITATE 10/9 - clinical decline with new pulmonary edema and worsening resp status with progressive cardiorenal syndrome  Terminally weaned and expired 05-13-17    SIGNED Dr. Brand Males, M.D., F.C.C.P Pulmonary and Critical Care Medicine Staff Physician [place] [facility] Pager: [phone], If no answer or between  15:00h - 7:00h: call [phone]  05/15/2017 5:30 PM

## 2017-05-23 NOTE — Progress Notes (Signed)
PULMONARY / CRITICAL CARE MEDICINE   Name: [name] [name] MRN: [id number] DOB: 07/24/1930    ADMISSION DATE:  05/18/2017   CONSULTATION DATE:  04/25/2017  REFERRING MD:  Louellen Molder, M.D. / [facility]  CHIEF COMPLAINT:  Septic Shock  HISTORY OF PRESENT ILLNESS:   83 yo male with weakness, decreased appetite, dizzy, abdominal distention and fall at home.  Admitted with failure to thrive.  Developed progressive hypoxia and cough.  This lead to PEA cardiac arrest and VDRF. PMHx of COPD/emphysema, CAD, CKD 3, dementia.  SUBJECTIVE:  Acute decompensation overnight in the setting of flash pulmonary edema now on escalated FiO2 and PEEP support  VITAL SIGNS: BP (!) 106/59   Pulse 88   Temp 99.1 F (37.3 C)   Resp (!) 29   Ht 5\' 7"  (1.702 m)   Wt 175 lb 11.3 oz (79.7 kg)   SpO2 90%   BMI 27.52 kg/m   VENTILATOR SETTINGS: Vent Mode: PRVC FiO2 (%):  [30 %-60 %] 60 % Set Rate:  [25 bmp] 25 bmp Vt Set:  [530 mL] 530 mL PEEP:  [5 cmH20-8 cmH20] 5 cmH20 Pressure Support:  [5 cmH20] 5 cmH20 Plateau Pressure:  [27 KDT26-71 cmH20] 28 cmH20  INTAKE / OUTPUT:  Intake/Output Summary (Last 24 hours) at May 11, 2017 2458 Last data filed at 05/11/17 0800  Gross per 24 hour  Intake          3887.74 ml  Output             1800 ml  Net          2087.74 ml    PHYSICAL EXAMINATION:  General - Elderly 83 year old male currently appears very uncomfortable, his respiratory efforts are unlabored, he has nasal flaring, as well as accessory muscle use on full ventilator support Eyes -his pupils are equal, reactive, his sclerae are not icteric ENT - he is orally intubated, nasal flaring as mentioned above, positive jugular venous distention Cardiac tachycardic, sinus tachycardia, no murmur rub or gallop Chest - diffuse rhonchi, marked accessory muscle use, on full ventilator support Abd - distended, tympanic to percussion, hyperactive bowel sounds Ext -his capillary refill is brisk, is warm to touch, he  has generalized anasarca Skin - warm and dry. Neuro - his eyes are open, he does not follow commands, he has profound weakness  LABS:  BMET  Recent Labs Lab 04/28/17 0348 04/29/17 0500 11-May-2017 0321  NA 148* 147* 148*  K 4.4 4.5 4.2  CL 114* 110 112*  CO2 22 23 23   BUN 65* 80* 99*  CREATININE 6.44* 6.69* 7.12*  GLUCOSE 178* 142* 145*    Electrolytes  Recent Labs Lab 04/28/17 0348 04/28/17 1720 04/29/17 0500 05-11-2017 0321  CALCIUM 8.9  --  9.0 8.8*  MG 2.2 2.3  --  2.0  PHOS 5.5* 5.0*  --  4.7*    CBC  Recent Labs Lab 04/28/17 0348 04/29/17 0500 2017-05-11 0321  WBC 5.2 8.7 9.8  HGB 8.0* 8.3* 7.4*  HCT 23.4* 24.6* 21.6*  PLT 135* 161 157    Coag's  Recent Labs Lab 04/25/17 1051  INR 1.46    Sepsis Markers  Recent Labs Lab 04/24/17 1735 04/24/17 2007 04/24/17 2304  LATICACIDVEN 7.3* 2.0* 1.3    ABG  Recent Labs Lab 04/26/17 0810 04/28/17 0358 2017-05-11 0057  PHART 7.280* 7.315* 7.342*  PCO2ART 41.2 39.1 41.7  PO2ART 66.3* 180* 66.7*    Liver Enzymes  Recent Labs Lab 04/25/17 0409 04/28/17 0348 May 11, 2017  0321  AST 644* 110* 52*  ALT 540* 284* 154*  ALKPHOS 37* 42 35*  BILITOT 0.6 0.6 0.6  ALBUMIN 3.0* 2.3* 2.2*    Cardiac Enzymes  Recent Labs Lab 04/24/17 1900 04/25/17 0830 04/27/17 0523  TROPONINI 0.76* 0.71* 0.32*    Glucose  Recent Labs Lab 04/29/17 1142 04/29/17 1641 04/29/17 1927 04/29/17 2304 2017/05/17 0334 05/17/17 0734  GLUCAP 168* 142* 146* 201* 137* 146*    Imaging Dg Abd 1 View  Result Date: 04/29/2017 CLINICAL DATA:  Orogastric tube placement. EXAM: ABDOMEN - 1 VIEW COMPARISON:  04/29/2017 at 1312 hours FINDINGS: Orogastric tube tip projects the distal stomach. Stomach is mildly distended. Normal bowel gas pattern. IMPRESSION: Orogastric tube is well positioned, tip in the distal stomach. Electronically Signed   By: Lajean Manes M.D.   On: 04/29/2017 16:32   Dg Abd 1 View  Result Date:  04/29/2017 CLINICAL DATA:  83 year old male with gastric tube placement. Initial encounter. EXAM: ABDOMEN - 1 VIEW COMPARISON:  05/22/2017 CT. FINDINGS: Nasogastric tube tip gastric pylorus level possibly entering the duodenal bulb. Gas distended stomach. Gas-filled top-normal size small bowel loops and colon. Scattered colonic diverticular. Contrast within rectum. The possibility of free intraperitoneal air cannot be assessed on a supine view. Penile prosthesis in place.  Vascular stents noted. IMPRESSION: Nasogastric tube tip gastric pylorus level possibly entering the duodenal bulb. Gas distended stomach. Gas-filled top-normal size small bowel loops and colon. Scattered colonic diverticular. Contrast within rectum. Electronically Signed   By: Genia Del M.D.   On: 04/29/2017 13:44   Dg Chest Port 1 View  Result Date: 05/17/2017 CLINICAL DATA:  Hypoxia EXAM: PORTABLE CHEST 1 VIEW COMPARISON:  Chest radiograph 04/29/2017 FINDINGS: Endotracheal tube tip is at level of the clavicular heads. Orogastric tube courses beyond the field of view. Right IJ central venous catheter tip is in the proximal right atrium. It sequelae of prior coronary artery bypass graft. Unchanged right-greater-than-left interstitial opacities. No pneumothorax or sizable pleural effusion. No new area of consolidation. IMPRESSION: Unchanged appearance of the chest compared to the radiograph of 04/29/2017. Persistent chronic bilateral interstitial opacities, right worse than left. Superimposed interstitial pulmonary edema would be difficult to exclude. Electronically Signed   By: Ulyses Jarred M.D.   On: May 17, 2017 01:35     STUDIES:  PFT 10/27/14 >> FVC 2.84 L (84%) FEV1 1.86 L (73%) FEV1/FVC 0.66 FEF 25/1.11 L (43%) CT abd/pelvis 10/1 >> emphysema and basilar fibrosis, b/l inguinal hernias. CT head 10/3 >> atrophy TTE 10/4 >> mild LVH, EF 55 to 60%, mild/mod RV enlargement EEG 10/05 >> moderate generalized low voltage CT head 10/6  >> atrophy and moderate chronic small vessel ischemic changes  MICROBIOLOGY: Blood Cultures x2 10/3 >>>negative Urine Culture 10/3 >>> negative Sputum Culture 10/4>> Yeast >>few Candida albicans  ANTIBIOTICS: Augmentin 10/2 - 10/3 Zosyn 10/3 >>> Vancomycin 10/3 >>> 10/4  SIGNIFICANT EVENTS: 10/01 - Admit to [facility] 10/03 - Pulseless arrest at outside hospital>>Found unresponsive at 4:30pm. Intubated by EDP. ROSC after 5-7 min of CPR & 1mg  Epi IV. 10/04 - Transfer to [facility] 10/06 - off pressors 10/8: Discussed goals of care with family, made DO NOT RESUSCITATE 10/9 flash pulmonary edema, progressive hypoxia, worsening ventilator compliance  LINES/TUBES: ETT 10/3 >>> Rt IJ CVL 10/4 >>>  ASSESSMENT / PLAN:  Acute on chronic hypoxic, hypercapnic respiratory failure from HCAP. Hx of advanced COPD with emphysema and pulmonary fibrosis. P CXR personally reviewed: Support lines and tubes are in  satisfactory position. He has new right greater than left airspace disease consistent with pulmonary edema Remarkable clinical acute decline last night, with new pulmonary edema and worsening work of breathing. This is almost certainly due to his progressive cardiorenal syndrome. Plan 7 of 7 Zosyn Continuing full ventilator support, PEEP adjusted for hypoxia Added when necessary morphine for pulmonary edema and dyspnea Scheduled Lasix When necessary bronchodilators Anticipate we will be doing a one-way wean, with plans to transition to comfort given his lack of progress, clinical decline in spite of appropriate therapy in the next 24 hours   PEA cardiac arrest 2nd to hypoxic respiratory failure. Septic shock >> resolved. NSTEMI 2nd to demand ischemia. Decompensated heart failure with pulmonary edema in the setting of cardiorenal syndrome Hypertension, HLD. Plan Continue aspirin, Crestor, Lopressor Continue nitroglycerin drip aiming to keep systolic blood pressure less than  110 Scheduled IV Lasix   Acute renal failure 2nd to ATN >> baseline creatinine 2.58 from 04/17/17. CKD 3. Lactic acidosis >> resolved. Hypernatremia Progressive renal failure, hypernatremia, and volume overload. Verified once again with wife he is not a dialysis candidate.  Plan Lasix 160 mg every 8 hours Aggressive blood pressure control Strict I&O Follow-up a.m. Chemistry  Anoxic encephalopathy. Hx of dementia. Plan Continue pain/anxiety/delirium protocol   Relative adrenal insufficiency >> cortisol 14.8 from 04/26/17. Plan Continue to wean Solu-Cortef to off  Anemia, thrombocytopenia of critical illness and chronic disease. Plan Continue subcutaneous heparin, trend CBC   Moderate protein calorie malnutrition. Plan Continue tube feeds for now  DVT prophylaxis - SQ heparin SUP - protonix Nutrition - tube feeds Goals of care - Full code  No family at bedside Discussion: Mr. Hommes is continuing to decline, this appears to be mostly due to his progressive cardiorenal syndrome with resultant pulmonary edema. He is volume overloaded today, as well as breathing is worse, his creatinine continues to climb. I had discussed at length his prognosis yesterday with his wife. She verbalized to me her concern about not letting her husband suffer. She understands his prognosis is poor. She understands we may be looking at a one-way extubation with early transition to comfort. I suspect that this will need to happen in the next 24 hours given his clinical decline   Critical care time 40 minutes  Erick Colace ACNP-BC [place] Pager # [phone] OR # [phone] if no answer

## 2017-05-23 NOTE — Progress Notes (Signed)
Pt son and wife requested that pt be transitioned to comfort care and expressed wishes for him to be extubated. This RN called Marni Griffon and rec'd orders to extubate. Called RT. Will continue to monitor.

## 2017-05-23 NOTE — Procedures (Signed)
Extubation Procedure Note  Patient Details:   Name: [name] [name] DOB: 05/09/1931 MRN: [id number]   Airway Documentation:  Airway 7.5 mm (Active)  Secured at (cm) 25 cm 05-18-2017  2:52 PM  Measured From Lips May 18, 2017  2:52 PM  [place] 05-18-2017  2:52 PM  Secured By [employer] May 18, 2017  2:52 PM  Tube Holder Repositioned Yes 05-18-17  2:52 PM  Cuff Pressure (cm H2O) 28 cm H2O 2017-05-18  2:52 PM  Site Condition Dry 2017-05-18  2:52 PM    Evaluation  O2 sats: currently acceptable Complications: No apparent complications Patient did tolerate procedure well. Bilateral Breath Sounds: Diminished, Fine crackles   No   Patient extubated for comfort care. Family at bedside. RT available if needed.   Lamonte Sakai May 18, 2017, 6:26 PM

## 2017-05-23 NOTE — Progress Notes (Addendum)
[facility] Progress Note Patient Name: KIMSEY DEMAREE DOB: [birth year] MRN: [id number]   Date of Service  05-07-2017  HPI/Events of Note  Remains dys-synchronous with mechanical ventilation. Sats decreased into 80's. No improvement with Fentanyl. Just received scheduled Lasix. Ventilator PRVC rate = 25, however, patient is breathing intermittently in 30's.  eICU Interventions  Will order: 1. Increase PEEP to 8. 2. Versed 1 mg IV Q 2 hours PRN.  3. Increase Duonebs to Q 4 hours.  4. Albuterol 2.5 mg via Neb Q 2 hours PRN.  5. Portable CXR STAT. 6. ABG STAT.     Intervention Category Intermediate Interventions: Respiratory distress - evaluation and management  Sommer,Steven Eugene 05/07/2017, 12:03 AM

## 2017-05-23 NOTE — Progress Notes (Signed)
[facility] MD notified of abg results.  No new vent changes/orders given at this time.

## 2017-05-23 NOTE — Consult Note (Signed)
   [facility] CM Inpatient Consult   May 30, 2017  [name] [name] [birth year] [id number]   [facility] Care Management follow up.   [name] has been active with [facility] Management program.  Chart reviewed. [name] remains sedated on vent in ICU. According to notes, likely anticipate one-wean.   Will continue to follow.    Marthenia Rolling, MSN-Ed, RN,BSN [facility] Liaison [phone]

## 2017-05-23 NOTE — Progress Notes (Signed)
Nutrition Follow-up  DOCUMENTATION CODES:   Not applicable  INTERVENTION:  - Continue Vital AF 1.2 @ 60 mL/hr. - Will monitor for plan concerning extubation.   NUTRITION DIAGNOSIS:   Inadequate oral intake related to inability to eat as evidenced by NPO status. -ongoing  GOAL:   Patient will meet greater than or equal to 90% of their needs -met with current TF regimen.   MONITOR:   Vent status, TF tolerance, Weight trends, Labs, Skin  ASSESSMENT:   Pt with PMH significant for COPD, CAD, CKD, and dementia. Presents this admission with generalized weakness resulting in a mechanical fall at home. Admitted for failure to thrive in adult. hypoxic, was found without a pulse the same day requiring 5-7 minutes of CPR.   10/9 Pt remains intubated with OGT in place. He is receiving Vital AF 1.2 @ 60 mL/hr with 200 mL free water every 4 hours (free water added yesterday AM) which is providing 1728 kcal (89% re-estimated kcal need), 108 grams of protein, and 2368 mL free water. Estimated needs updated and based on weight on 10/1 (172 lbs/78.1 kg) as weight +3 lbs/1.6 kg since that time.   Reviewed PCCM NP note from this AM. Pt with new pulmonary edema and worsening WOB; thought to be 2/2 cardiorenal syndrome. Note also states "Anticipate we will be doing a one-way wean, with plans to transition to comfort given his lack of progress, clinical decline in spite of appropriate therapy in the next 24 hours."  Patient is currently intubated on ventilator support MV: 15.2 L/min Temp (24hrs), Avg:99.1 F (37.3 C), Min:98.4 F (36.9 C), Max:99.9 F (37.7 C) BP: 89/49 and MAP: 59  Medications reviewed; 160 mg IV Lasix TID starting today, 80 mg IV Lasix x2 doses yesterday, 40 mg Protonix per OGT/day.  Labs reviewed; CBGs: 137 and 140 mg/dL this AM, Na: 148 mmol/L, Cl: 112 mmol/L, BUN: 99 mg/dL, creatinine: 7.12 mg/dL, Ca: 8.8 mg/dL, Phos: 4.7 mg/dL, LFTs elevated, GFR: 7 mL/min.   IVF: D5 @ 40  mL/hr (163 kcal from dextrose). Drip: Nitro @ 120 mcg/min.   10/5 Patient is currently intubated on ventilator support MV: 13.2 L/min Temp (24hrs), Avg:99.7 F (37.6 C), Min:97.5 F (36.4 C), Max:100 F (37.8 C) Propofol: None BP: 130/64 MAP: 86  - No family at bedside to provide nutrition history. - Pt was placed on dysphagia 2 diet 10/2and consumed 25% of one meal before intubation.  - MD notes pt experienced progressively declining appetite with abdominal distention prior to admission. - Records indicate pt has maintained his weight of 160-165 lb since October 2017.  - Do not suspect any significant wt loss.   Nutrition-Focused physical exam completed. Findings are mild fat depletion, mild to moderate muscle depletion, and moderate generalized edema. Fluid accumulation may be masking losses.    Diet Order:  Diet NPO time specified  Skin:  Wound (see comment) (Stage 1 sacral pressure injury)  Last BM:  10/9  Height:   Ht Readings from Last 1 Encounters:  04/29/17 5' 7" (1.702 m)    Weight:   Wt Readings from Last 1 Encounters:  05-06-2017 175 lb 11.3 oz (79.7 kg)    Ideal Body Weight:  67.3 kg  BMI:  Body mass index is 27.52 kg/m.  Estimated Nutritional Needs:   Kcal:  1938  Protein:  94-117 grams (1.2-1.5 grams/kg)  Fluid:  1.7L/day  EDUCATION NEEDS:   No education needs identified at this time    Jarome Matin, MS, RD,  LDN, CNSC Inpatient Clinical Dietitian Pager # [phone] After hours/weekend pager # [phone]

## 2017-05-23 NOTE — Progress Notes (Signed)
Patient respirations ceased and heartbeat unheard, asystole on monitor at [place]. Family at bedside.  Pronounced by Park Breed RN and Tessie Eke RN.

## 2017-05-23 NOTE — Progress Notes (Signed)
Patient breathing 30-40 times per minute and dys-synchronous with ventilator. 12.43mcg of Fentanyl given @ 1833 by previous nurse for dyssynchrony, without any change. PCCM on call physician notified and order written to increase Fentanyl to 25 mcg every 2 hours. Will give medication and reassess.

## 2017-05-23 NOTE — Progress Notes (Signed)
Dr. Oletta Darter notified of oxygen saturation in low 80s, RR 30s, pink-tinged secretions from ETT inline suction, and continued dyssynchrony after giving prn Fentanyl. Respiratory therapist at bedside. New orders written.

## 2017-05-23 NOTE — Progress Notes (Signed)
eLink Physician-Brief Progress Note Patient Name: [name] [name] DOB: 1931-06-01 MRN: [id number]   Date of Service  05-15-2017  HPI/Events of Note  CXR c/w pulmonary edema. Patient is in renal failure.   eICU Interventions  Will order: 1. Lasix 160 mg IV now. 2. Nitroglycerin IV infusion. Titrate to SBP < 110. 3. 12 Lead EKG now.      Intervention Category Major Interventions: Hypoxemia - evaluation and management  Sommer,Steven Eugene 15-May-2017, 1:15 AM

## 2017-05-23 NOTE — Progress Notes (Signed)
Holding wean at this time due to patient tachypneic, labored breathing.

## 2017-05-23 DEATH — deceased

## 2018-06-26 IMAGING — CT CT HEAD W/O CM
3 series · 16 of 47 positions shown, 19 images · non-contrast
Comparison: 04/24/2017 CT.

CLINICAL DATA: Recent cardiopulmonary arrest. Poor recovery of
mental status.

EXAM:
CT HEAD WITHOUT CONTRAST
TECHNIQUE: Contiguous axial images were obtained from the base of the skull
through the vertex without intravenous contrast.

[Series 2: head wo · axial · 0.47mm/px · z∈[-136,-11]mm · 10 of 30 slices shown, 13 images]
[im 3/30  brain]
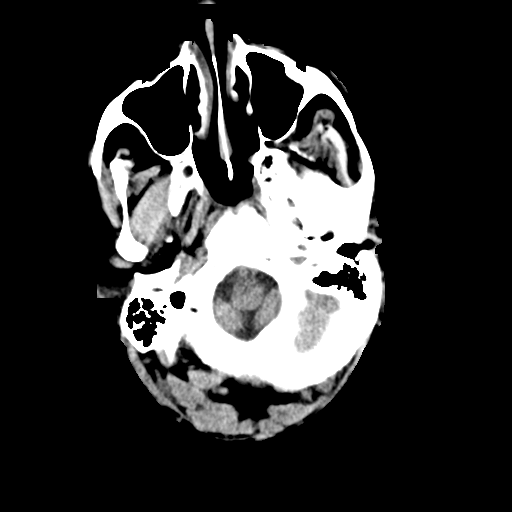
[im 3/30  bone]
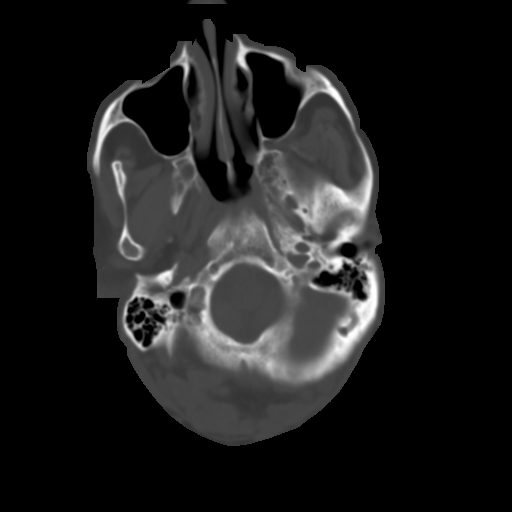
[im 6/30  brain]
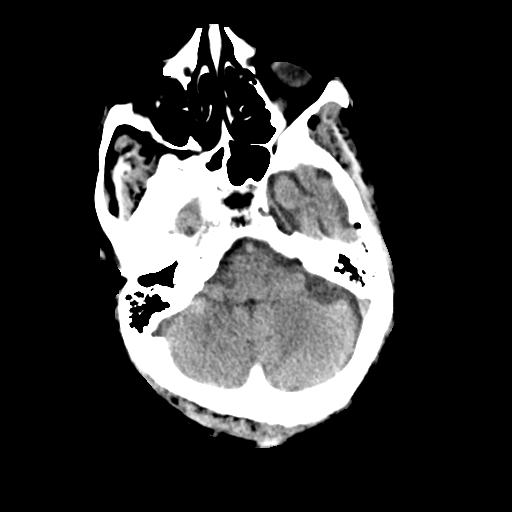
[im 9/30  brain]
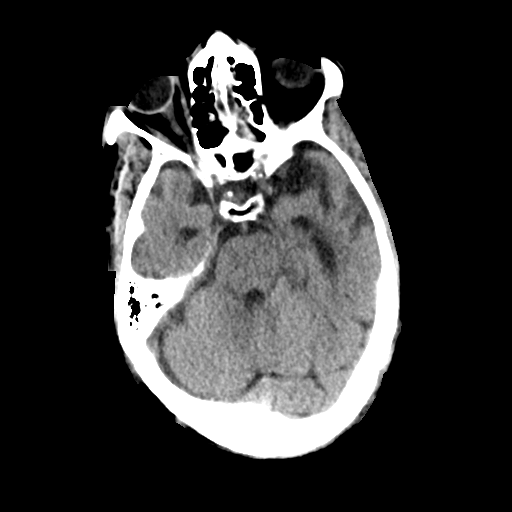
[im 11/30  brain]
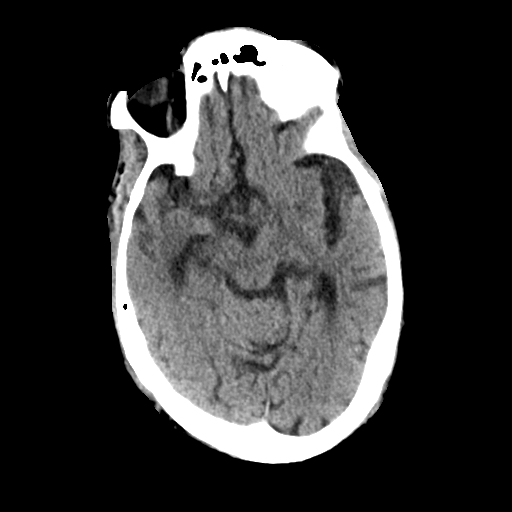
[im 14/30  brain]
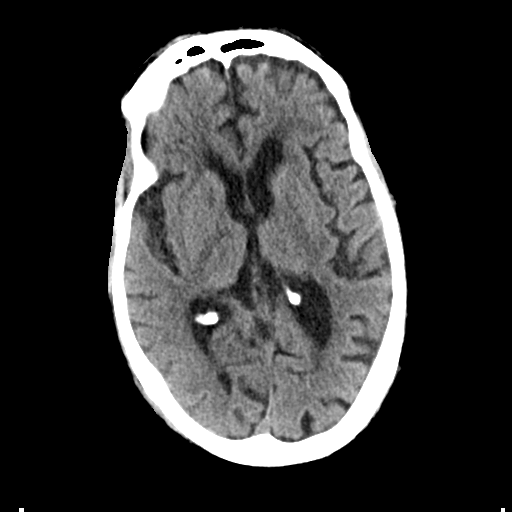
[im 14/30  bone]
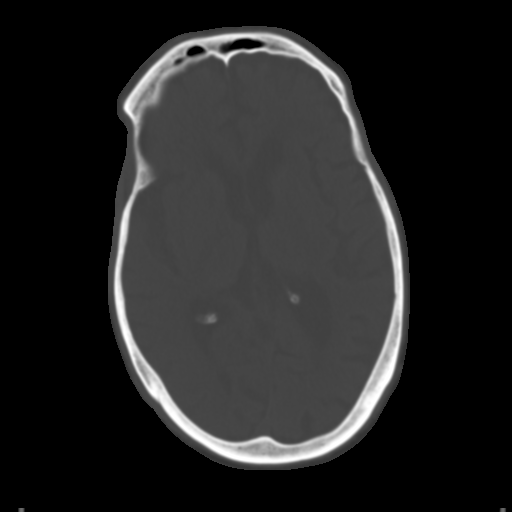
[im 17/30  brain]
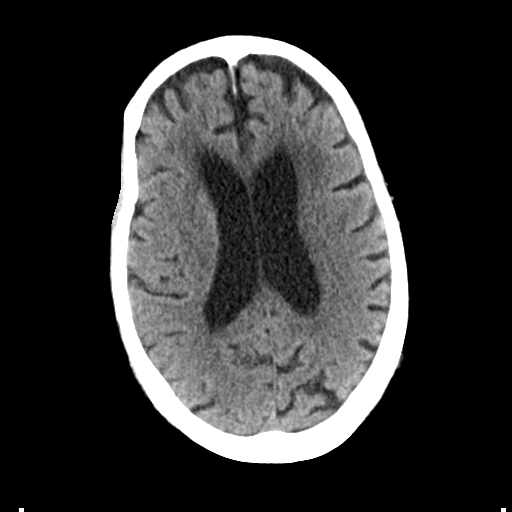
[im 20/30  brain]
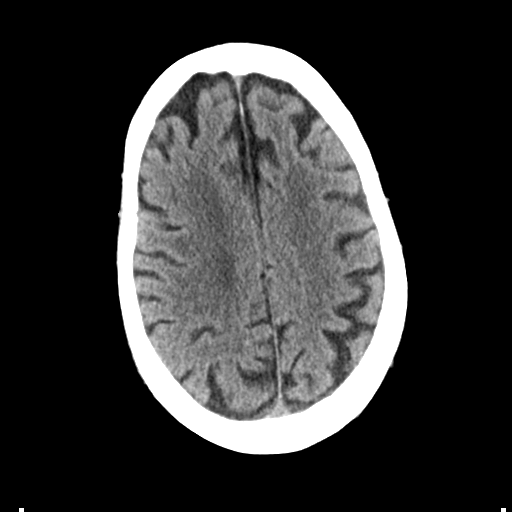
[im 23/30  brain]
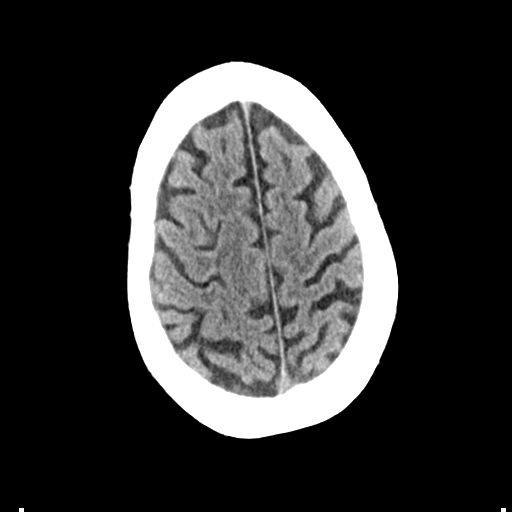
[im 25/30  brain]
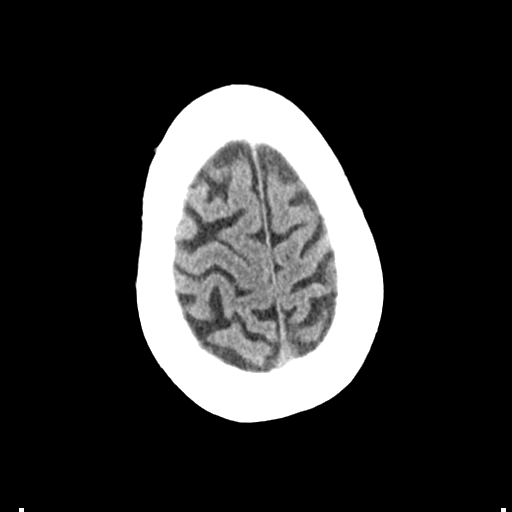
[im 25/30  bone]
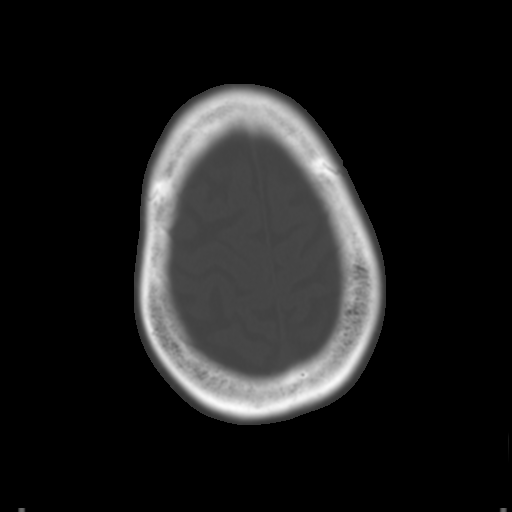
[im 28/30  brain]
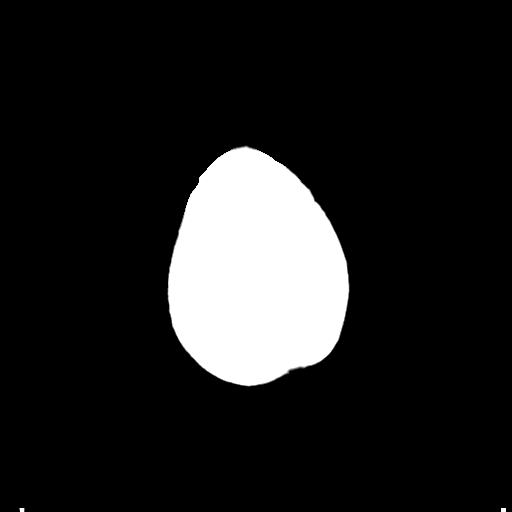

[Series 4: coronal soft tissue · coronal · 0.30mm/px · 3 of 69 slices shown]
[im 23/69  brain]
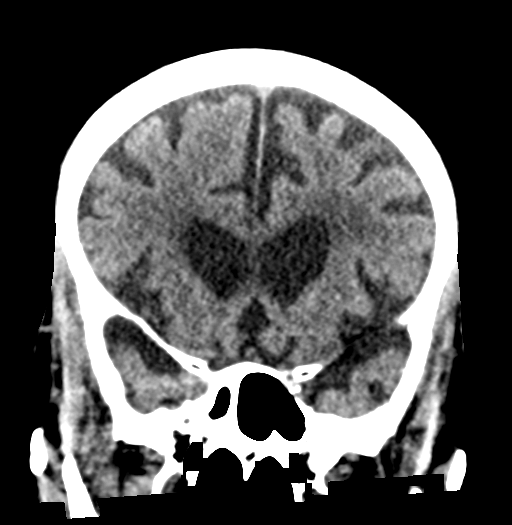
[im 31/69  brain]
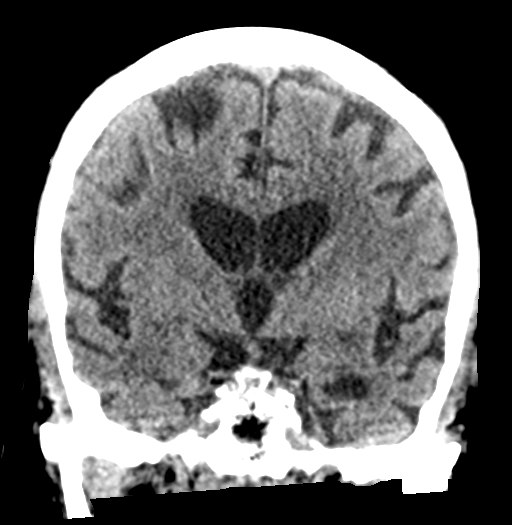
[im 38/69  brain]
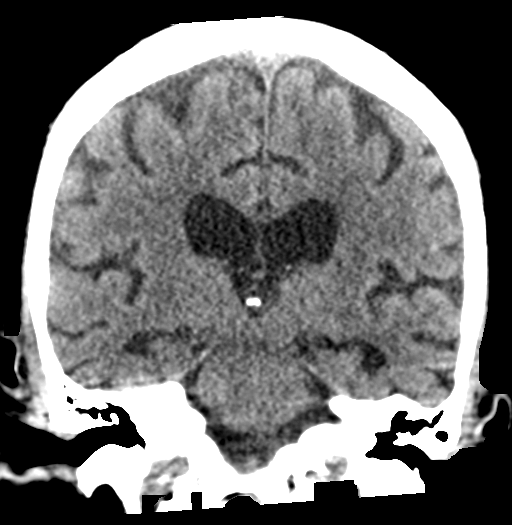

[Series 5: sagittal soft tissue · sagittal · 0.33mm/px · 3 of 47 slices shown]
[im 16/47  brain]
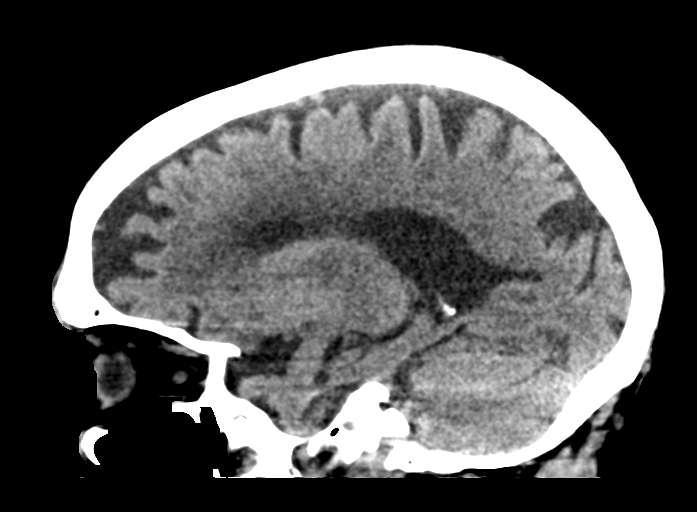
[im 24/47  brain]
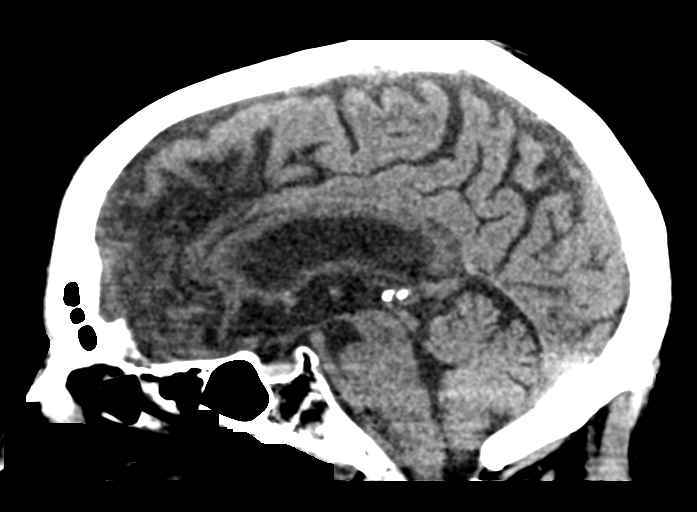
[im 31/47  brain]
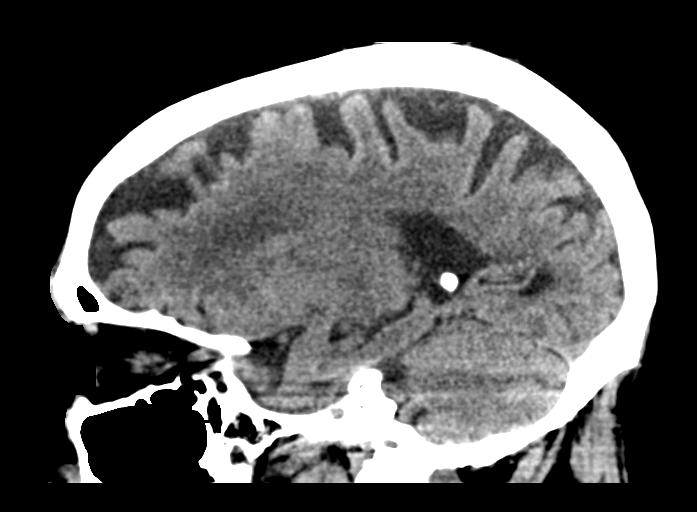

[16 of 47 positions shown; findings below may reference images not displayed]

FINDINGS: Brain: Generalized brain atrophy. Moderate chronic small-vessel
ischemic changes affecting the cerebral hemispheric white matter. No
evidence of acute or subacute infarction, mass lesion, hemorrhage,
hydrocephalus or extra-axial collection.

Vascular: There is atherosclerotic calcification of the major
vessels at the base of the brain.

Skull: Negative

Sinuses/Orbits: Clear/normal

Other: None
IMPRESSION: No acute finding by CT. Atrophy and moderate chronic small-vessel
ischemic changes throughout the brain, similar to the previous
study.

## 2018-06-27 IMAGING — DX DG CHEST 1V PORT
1 series · 1 of 1 positions shown · non-contrast
Comparison: 04/27/2017

CLINICAL DATA: Respiratory failure

EXAM:
PORTABLE CHEST 1 VIEW

[chest ap]
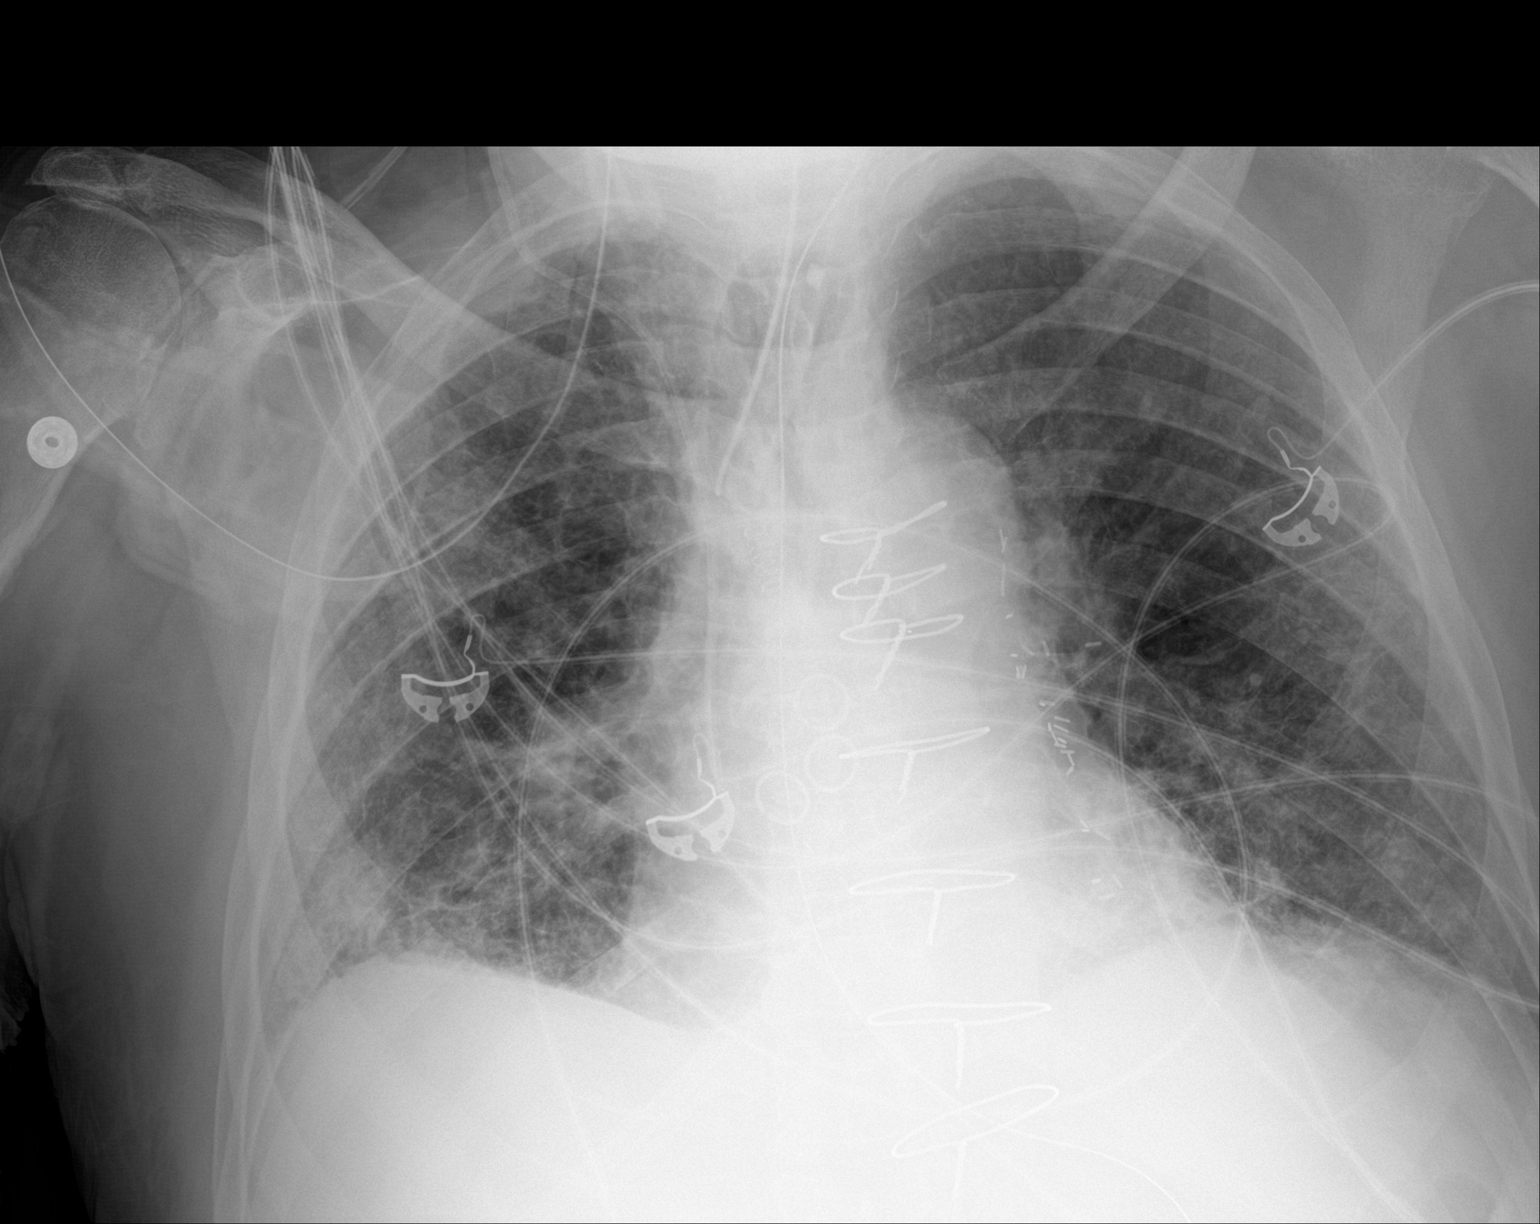

[1 of 1 positions shown; findings below may reference images not displayed]

FINDINGS: Endotracheal tube with the tip 2.8 cm above the carina. Nasogastric
tube coursing below the diaphragm. Right jugular central venous
catheter with the tip projecting over the cavoatrial junction.

Bilateral diffuse interstitial and alveolar airspace opacities.
Trace right pleural effusion. No pneumothorax. Stable
cardiomediastinal silhouette. Prior CABG. No acute osseous
abnormality.
IMPRESSION: 1. Support lines and tubing in satisfactory position as described
above.
2. Mild pulmonary edema.

## 2018-06-29 IMAGING — DX DG CHEST 1V PORT
1 series · 1 of 1 positions shown · non-contrast
Comparison: Chest radiograph 04/29/2017

CLINICAL DATA: Hypoxia

EXAM:
PORTABLE CHEST 1 VIEW

[chest ap]
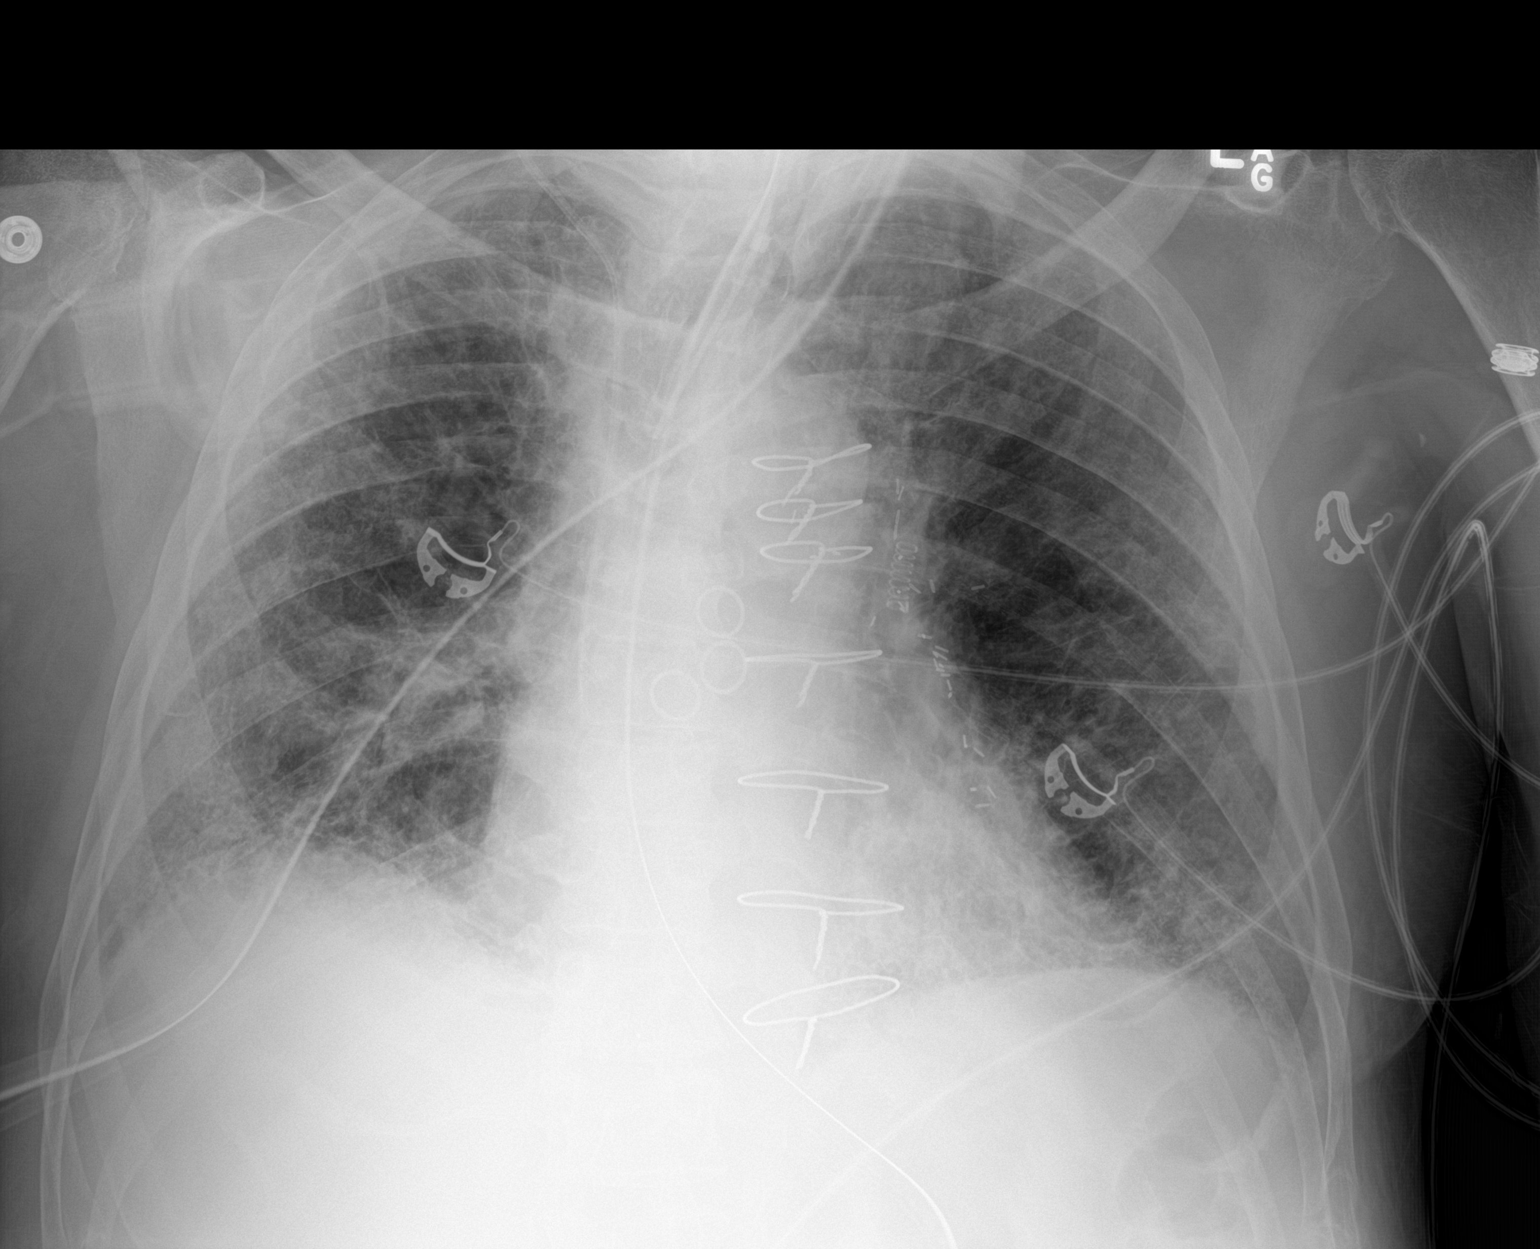

[1 of 1 positions shown; findings below may reference images not displayed]

FINDINGS: Endotracheal tube tip is at level of the clavicular heads.
Orogastric tube courses beyond the field of view. Right IJ central
venous catheter tip is in the proximal right atrium. It sequelae of
prior coronary artery bypass graft.

Unchanged right-greater-than-left interstitial opacities. No
pneumothorax or sizable pleural effusion. No new area of
consolidation.
IMPRESSION: Unchanged appearance of the chest compared to the radiograph of
04/29/2017. Persistent chronic bilateral interstitial opacities,
right worse than left. Superimposed interstitial pulmonary edema
would be difficult to exclude.

## 2022-06-22 DEATH — deceased
